# Patient Record
Sex: Female | Born: 1965
Health system: Southern US, Community
[De-identification: ages and names within clinical notes are randomized; demographics above are authoritative.]

## PROBLEM LIST (undated history)

## (undated) DIAGNOSIS — I82409 Acute embolism and thrombosis of unspecified deep veins of unspecified lower extremity: Secondary | ICD-10-CM

## (undated) DIAGNOSIS — Z8719 Personal history of other diseases of the digestive system: Secondary | ICD-10-CM

## (undated) DIAGNOSIS — K219 Gastro-esophageal reflux disease without esophagitis: Secondary | ICD-10-CM

## (undated) DIAGNOSIS — F419 Anxiety disorder, unspecified: Secondary | ICD-10-CM

## (undated) DIAGNOSIS — J309 Allergic rhinitis, unspecified: Secondary | ICD-10-CM

## (undated) DIAGNOSIS — F329 Major depressive disorder, single episode, unspecified: Secondary | ICD-10-CM

## (undated) DIAGNOSIS — G40909 Epilepsy, unspecified, not intractable, without status epilepticus: Secondary | ICD-10-CM

## (undated) DIAGNOSIS — C649 Malignant neoplasm of unspecified kidney, except renal pelvis: Secondary | ICD-10-CM

## (undated) DIAGNOSIS — I10 Essential (primary) hypertension: Secondary | ICD-10-CM

## (undated) DIAGNOSIS — E785 Hyperlipidemia, unspecified: Secondary | ICD-10-CM

## (undated) DIAGNOSIS — F32A Depression, unspecified: Secondary | ICD-10-CM

## (undated) DIAGNOSIS — I319 Disease of pericardium, unspecified: Secondary | ICD-10-CM

## (undated) HISTORY — DX: Major depressive disorder, single episode, unspecified: F32.9

## (undated) HISTORY — DX: Allergic rhinitis, unspecified: J30.9

## (undated) HISTORY — DX: Gastro-esophageal reflux disease without esophagitis: K21.9

## (undated) HISTORY — DX: Depression, unspecified: F32.A

## (undated) HISTORY — PX: NEPHRECTOMY: SHX65

## (undated) HISTORY — DX: Hyperlipidemia, unspecified: E78.5

## (undated) HISTORY — PX: TUBAL LIGATION: SHX77

## (undated) HISTORY — PX: UMBILICAL HERNIA REPAIR: SHX196

---

## 1997-06-30 ENCOUNTER — Encounter: Admission: RE | Admit: 1997-06-30 | Discharge: 1997-09-28 | Payer: Self-pay | Admitting: Obstetrics & Gynecology

## 1997-10-20 ENCOUNTER — Inpatient Hospital Stay (HOSPITAL_COMMUNITY): Admission: AD | Admit: 1997-10-20 | Discharge: 1997-10-20 | Payer: Self-pay | Admitting: Obstetrics & Gynecology

## 1997-11-21 ENCOUNTER — Encounter: Admission: RE | Admit: 1997-11-21 | Discharge: 1997-11-21 | Payer: Self-pay | Admitting: Internal Medicine

## 1998-03-08 ENCOUNTER — Encounter: Admission: RE | Admit: 1998-03-08 | Discharge: 1998-03-08 | Payer: Self-pay | Admitting: Internal Medicine

## 1998-07-04 ENCOUNTER — Encounter: Admission: RE | Admit: 1998-07-04 | Discharge: 1998-07-04 | Payer: Self-pay | Admitting: Internal Medicine

## 1998-07-26 ENCOUNTER — Ambulatory Visit (HOSPITAL_COMMUNITY): Admission: RE | Admit: 1998-07-26 | Discharge: 1998-07-26 | Payer: Self-pay | Admitting: Hematology and Oncology

## 1998-07-26 ENCOUNTER — Encounter: Payer: Self-pay | Admitting: Hematology and Oncology

## 1998-07-26 ENCOUNTER — Encounter: Admission: RE | Admit: 1998-07-26 | Discharge: 1998-07-26 | Payer: Self-pay | Admitting: Hematology and Oncology

## 1998-08-09 ENCOUNTER — Encounter: Admission: RE | Admit: 1998-08-09 | Discharge: 1998-08-09 | Payer: Self-pay | Admitting: Internal Medicine

## 2001-06-02 ENCOUNTER — Other Ambulatory Visit: Admission: RE | Admit: 2001-06-02 | Discharge: 2001-06-02 | Payer: Self-pay | Admitting: Family Medicine

## 2002-05-19 ENCOUNTER — Other Ambulatory Visit: Admission: RE | Admit: 2002-05-19 | Discharge: 2002-05-19 | Payer: Self-pay | Admitting: *Deleted

## 2002-05-24 ENCOUNTER — Encounter: Admission: RE | Admit: 2002-05-24 | Discharge: 2002-08-22 | Payer: Self-pay | Admitting: Family Medicine

## 2002-06-25 ENCOUNTER — Encounter (INDEPENDENT_AMBULATORY_CARE_PROVIDER_SITE_OTHER): Payer: Self-pay

## 2002-06-25 ENCOUNTER — Ambulatory Visit (HOSPITAL_COMMUNITY): Admission: RE | Admit: 2002-06-25 | Discharge: 2002-06-25 | Payer: Self-pay | Admitting: *Deleted

## 2004-05-16 ENCOUNTER — Emergency Department: Payer: Self-pay | Admitting: Emergency Medicine

## 2005-04-01 HISTORY — PX: ENDOMETRIAL ABLATION: SHX621

## 2005-08-12 ENCOUNTER — Ambulatory Visit: Payer: Self-pay | Admitting: General Practice

## 2005-11-12 ENCOUNTER — Ambulatory Visit: Payer: Self-pay | Admitting: Infectious Diseases

## 2005-11-25 ENCOUNTER — Ambulatory Visit: Payer: Self-pay | Admitting: Infectious Diseases

## 2005-12-03 ENCOUNTER — Other Ambulatory Visit: Payer: Self-pay

## 2005-12-05 ENCOUNTER — Ambulatory Visit: Payer: Self-pay | Admitting: Unknown Physician Specialty

## 2005-12-10 ENCOUNTER — Ambulatory Visit: Payer: Self-pay | Admitting: Infectious Diseases

## 2005-12-17 ENCOUNTER — Ambulatory Visit: Payer: Self-pay | Admitting: Pain Medicine

## 2005-12-18 ENCOUNTER — Ambulatory Visit: Payer: Self-pay | Admitting: Pain Medicine

## 2006-01-06 ENCOUNTER — Other Ambulatory Visit: Payer: Self-pay

## 2006-01-06 ENCOUNTER — Ambulatory Visit: Payer: Self-pay | Admitting: Infectious Diseases

## 2006-06-18 ENCOUNTER — Ambulatory Visit: Payer: Self-pay

## 2006-06-21 ENCOUNTER — Emergency Department (HOSPITAL_COMMUNITY): Admission: EM | Admit: 2006-06-21 | Discharge: 2006-06-21 | Payer: Self-pay | Admitting: Emergency Medicine

## 2007-01-17 ENCOUNTER — Emergency Department (HOSPITAL_COMMUNITY): Admission: EM | Admit: 2007-01-17 | Discharge: 2007-01-17 | Payer: Self-pay | Admitting: Emergency Medicine

## 2007-01-26 ENCOUNTER — Ambulatory Visit: Payer: Self-pay | Admitting: Internal Medicine

## 2007-01-26 ENCOUNTER — Other Ambulatory Visit: Payer: Self-pay

## 2007-04-02 DIAGNOSIS — C649 Malignant neoplasm of unspecified kidney, except renal pelvis: Secondary | ICD-10-CM

## 2007-04-02 HISTORY — DX: Malignant neoplasm of unspecified kidney, except renal pelvis: C64.9

## 2007-08-12 ENCOUNTER — Ambulatory Visit: Payer: Self-pay

## 2007-09-18 ENCOUNTER — Emergency Department (HOSPITAL_COMMUNITY): Admission: EM | Admit: 2007-09-18 | Discharge: 2007-09-18 | Payer: Self-pay | Admitting: Emergency Medicine

## 2007-11-06 ENCOUNTER — Inpatient Hospital Stay (HOSPITAL_COMMUNITY): Admission: RE | Admit: 2007-11-06 | Discharge: 2007-11-10 | Payer: Self-pay | Admitting: Urology

## 2007-11-06 ENCOUNTER — Encounter (INDEPENDENT_AMBULATORY_CARE_PROVIDER_SITE_OTHER): Payer: Self-pay | Admitting: Urology

## 2007-11-15 ENCOUNTER — Inpatient Hospital Stay (HOSPITAL_COMMUNITY): Admission: EM | Admit: 2007-11-15 | Discharge: 2007-11-17 | Payer: Self-pay | Admitting: Emergency Medicine

## 2007-12-01 DIAGNOSIS — I82409 Acute embolism and thrombosis of unspecified deep veins of unspecified lower extremity: Secondary | ICD-10-CM

## 2007-12-01 HISTORY — DX: Acute embolism and thrombosis of unspecified deep veins of unspecified lower extremity: I82.409

## 2007-12-07 ENCOUNTER — Inpatient Hospital Stay (HOSPITAL_COMMUNITY): Admission: EM | Admit: 2007-12-07 | Discharge: 2007-12-14 | Payer: Self-pay | Admitting: Emergency Medicine

## 2007-12-08 ENCOUNTER — Ambulatory Visit: Payer: Self-pay | Admitting: Vascular Surgery

## 2007-12-08 ENCOUNTER — Encounter (INDEPENDENT_AMBULATORY_CARE_PROVIDER_SITE_OTHER): Payer: Self-pay | Admitting: Internal Medicine

## 2007-12-09 ENCOUNTER — Encounter (INDEPENDENT_AMBULATORY_CARE_PROVIDER_SITE_OTHER): Payer: Self-pay | Admitting: Internal Medicine

## 2007-12-15 ENCOUNTER — Ambulatory Visit: Payer: Self-pay | Admitting: Internal Medicine

## 2007-12-28 ENCOUNTER — Ambulatory Visit: Payer: Self-pay | Admitting: Internal Medicine

## 2008-01-04 ENCOUNTER — Ambulatory Visit: Payer: Self-pay | Admitting: Internal Medicine

## 2008-01-11 ENCOUNTER — Ambulatory Visit: Payer: Self-pay | Admitting: Internal Medicine

## 2008-01-19 ENCOUNTER — Ambulatory Visit: Payer: Self-pay | Admitting: Internal Medicine

## 2008-03-03 ENCOUNTER — Ambulatory Visit: Payer: Self-pay | Admitting: Internal Medicine

## 2008-04-06 ENCOUNTER — Ambulatory Visit: Payer: Self-pay | Admitting: Specialist

## 2008-04-12 ENCOUNTER — Ambulatory Visit: Payer: Self-pay | Admitting: Internal Medicine

## 2008-04-13 ENCOUNTER — Ambulatory Visit: Payer: Self-pay | Admitting: Internal Medicine

## 2008-04-18 ENCOUNTER — Ambulatory Visit: Payer: Self-pay | Admitting: Internal Medicine

## 2008-04-26 ENCOUNTER — Ambulatory Visit: Payer: Self-pay | Admitting: Pain Medicine

## 2008-05-04 ENCOUNTER — Ambulatory Visit: Payer: Self-pay | Admitting: Pain Medicine

## 2008-05-27 ENCOUNTER — Ambulatory Visit: Payer: Self-pay | Admitting: Internal Medicine

## 2008-06-14 ENCOUNTER — Ambulatory Visit: Payer: Self-pay | Admitting: Pain Medicine

## 2008-06-20 ENCOUNTER — Ambulatory Visit: Payer: Self-pay | Admitting: Pain Medicine

## 2008-08-30 ENCOUNTER — Ambulatory Visit: Payer: Self-pay

## 2008-09-26 ENCOUNTER — Emergency Department (HOSPITAL_COMMUNITY): Admission: EM | Admit: 2008-09-26 | Discharge: 2008-09-27 | Payer: Self-pay | Admitting: Emergency Medicine

## 2009-01-31 ENCOUNTER — Ambulatory Visit: Payer: Self-pay | Admitting: Pain Medicine

## 2009-02-06 ENCOUNTER — Ambulatory Visit: Payer: Self-pay | Admitting: Pain Medicine

## 2009-02-14 ENCOUNTER — Emergency Department (HOSPITAL_COMMUNITY): Admission: EM | Admit: 2009-02-14 | Discharge: 2009-02-14 | Payer: Self-pay | Admitting: Emergency Medicine

## 2009-02-20 ENCOUNTER — Ambulatory Visit: Payer: Self-pay | Admitting: Family Medicine

## 2009-02-21 ENCOUNTER — Ambulatory Visit: Payer: Self-pay | Admitting: Pain Medicine

## 2009-03-29 ENCOUNTER — Ambulatory Visit: Payer: Self-pay | Admitting: Pain Medicine

## 2009-04-03 ENCOUNTER — Encounter: Payer: Self-pay | Admitting: Neurology

## 2009-04-14 ENCOUNTER — Observation Stay (HOSPITAL_COMMUNITY): Admission: EM | Admit: 2009-04-14 | Discharge: 2009-04-15 | Payer: Self-pay | Admitting: Emergency Medicine

## 2009-05-02 ENCOUNTER — Encounter: Payer: Self-pay | Admitting: Neurology

## 2009-08-08 ENCOUNTER — Ambulatory Visit: Payer: Self-pay | Admitting: Nephrology

## 2009-10-19 ENCOUNTER — Ambulatory Visit: Payer: Self-pay | Admitting: Family Medicine

## 2009-12-09 ENCOUNTER — Emergency Department (HOSPITAL_COMMUNITY): Admission: EM | Admit: 2009-12-09 | Discharge: 2009-12-10 | Payer: Self-pay | Admitting: Emergency Medicine

## 2010-05-07 ENCOUNTER — Emergency Department: Payer: Self-pay | Admitting: Emergency Medicine

## 2010-05-14 ENCOUNTER — Emergency Department: Payer: Self-pay | Admitting: Emergency Medicine

## 2010-06-14 LAB — POCT I-STAT, CHEM 8
BUN: 27 mg/dL — ABNORMAL HIGH (ref 6–23)
Calcium, Ion: 0.91 mmol/L — ABNORMAL LOW (ref 1.12–1.32)
Chloride: 106 mEq/L (ref 96–112)
Creatinine, Ser: 1.2 mg/dL (ref 0.4–1.2)
Glucose, Bld: 92 mg/dL (ref 70–99)
HCT: 40 % (ref 36.0–46.0)

## 2010-06-14 LAB — POCT CARDIAC MARKERS
CKMB, poc: <1 ng/mL — ABNORMAL LOW (ref 1.0–8.0)
Myoglobin, poc: 62.5 ng/mL (ref 12–200)
Troponin i, poc: <0.05 ng/mL (ref 0.00–0.09)

## 2010-06-14 LAB — URINALYSIS, ROUTINE W REFLEX MICROSCOPIC
Bilirubin Urine: NEGATIVE
Glucose, UA: NEGATIVE mg/dL
Ketones, ur: NEGATIVE mg/dL
Protein, ur: NEGATIVE mg/dL

## 2010-06-14 LAB — CBC
HCT: 37.5 % (ref 36.0–46.0)
MCH: 30.2 pg (ref 26.0–34.0)
MCV: 88.2 fL (ref 78.0–100.0)
Platelets: 288 10*3/uL (ref 150–400)
RDW: 15 % (ref 11.5–15.5)

## 2010-06-14 LAB — DIFFERENTIAL
Eosinophils Absolute: 0.1 10*3/uL (ref 0.0–0.7)
Eosinophils Relative: 2 % (ref 0–5)
Lymphs Abs: 2.3 10*3/uL (ref 0.7–4.0)
Monocytes Absolute: 0.7 10*3/uL (ref 0.1–1.0)

## 2010-06-14 LAB — D-DIMER, QUANTITATIVE: D-Dimer, Quant: 0.41 ug/mL-FEU (ref 0.00–0.48)

## 2010-06-14 LAB — POCT PREGNANCY, URINE: Preg Test, Ur: NEGATIVE

## 2010-06-17 LAB — CARDIAC PANEL(CRET KIN+CKTOT+MB+TROPI)
Relative Index: INVALID (ref 0.0–2.5)
Relative Index: INVALID (ref 0.0–2.5)
Total CK: 34 U/L (ref 7–177)
Total CK: 37 U/L (ref 7–177)
Troponin I: 0.02 ng/mL (ref 0.00–0.06)

## 2010-06-17 LAB — HEMOGLOBIN A1C
Hgb A1c MFr Bld: 6.2 % — ABNORMAL HIGH (ref 4.6–6.1)
Mean Plasma Glucose: 131 mg/dL

## 2010-06-17 LAB — CBC
HCT: 37 % (ref 36.0–46.0)
MCHC: 33.9 g/dL (ref 30.0–36.0)
MCHC: 34.2 g/dL (ref 30.0–36.0)
MCV: 90.8 fL (ref 78.0–100.0)
MCV: 90.9 fL (ref 78.0–100.0)
MCV: 91.3 fL (ref 78.0–100.0)
Platelets: 230 10*3/uL (ref 150–400)
Platelets: 290 10*3/uL (ref 150–400)
RBC: 3.41 MIL/uL — ABNORMAL LOW (ref 3.87–5.11)
RBC: 3.46 MIL/uL — ABNORMAL LOW (ref 3.87–5.11)
RDW: 14.5 % (ref 11.5–15.5)
WBC: 5 10*3/uL (ref 4.0–10.5)
WBC: 5.5 10*3/uL (ref 4.0–10.5)

## 2010-06-17 LAB — COMPREHENSIVE METABOLIC PANEL
BUN: 24 mg/dL — ABNORMAL HIGH (ref 6–23)
Calcium: 9.2 mg/dL (ref 8.4–10.5)
Glucose, Bld: 112 mg/dL — ABNORMAL HIGH (ref 70–99)
Total Protein: 7.4 g/dL (ref 6.0–8.3)

## 2010-06-17 LAB — DIFFERENTIAL
Basophils Absolute: 0.1 10*3/uL (ref 0.0–0.1)
Basophils Relative: 2 % — ABNORMAL HIGH (ref 0–1)
Lymphs Abs: 1.8 10*3/uL (ref 0.7–4.0)
Monocytes Relative: 12 % (ref 3–12)
Monocytes Relative: 8 % (ref 3–12)
Neutro Abs: 2.4 10*3/uL (ref 1.7–7.7)
Neutro Abs: 3.9 10*3/uL (ref 1.7–7.7)
Neutrophils Relative %: 43 % (ref 43–77)
Neutrophils Relative %: 62 % (ref 43–77)

## 2010-06-17 LAB — CK TOTAL AND CKMB (NOT AT ARMC)
CK, MB: 0.4 ng/mL (ref 0.3–4.0)
CK, MB: 0.5 ng/mL (ref 0.3–4.0)
Relative Index: INVALID (ref 0.0–2.5)
Total CK: 36 U/L (ref 7–177)
Total CK: 42 U/L (ref 7–177)

## 2010-06-17 LAB — AMYLASE: Amylase: 76 U/L (ref 0–105)

## 2010-06-17 LAB — LIPID PANEL
HDL: 35 mg/dL — ABNORMAL LOW (ref >39)
Total CHOL/HDL Ratio: 3.9 RATIO
VLDL: 29 mg/dL (ref 0–40)

## 2010-06-17 LAB — POCT I-STAT, CHEM 8
Chloride: 107 mEq/L (ref 96–112)
Creatinine, Ser: 1 mg/dL (ref 0.4–1.2)
Glucose, Bld: 114 mg/dL — ABNORMAL HIGH (ref 70–99)
HCT: 38 % (ref 36.0–46.0)
Potassium: 4.2 mEq/L (ref 3.5–5.1)

## 2010-06-17 LAB — BASIC METABOLIC PANEL
CO2: 25 mEq/L (ref 19–32)
Calcium: 8.3 mg/dL — ABNORMAL LOW (ref 8.4–10.5)
Chloride: 100 mEq/L (ref 96–112)
Chloride: 102 mEq/L (ref 96–112)
Creatinine, Ser: 0.98 mg/dL (ref 0.4–1.2)
GFR calc Af Amer: >60 mL/min (ref >60)
GFR calc Af Amer: >60 mL/min (ref >60)
GFR calc non Af Amer: >60 mL/min (ref >60)
Sodium: 133 mEq/L — ABNORMAL LOW (ref 135–145)

## 2010-06-17 LAB — MAGNESIUM: Magnesium: 2.5 mg/dL (ref 1.5–2.5)

## 2010-06-17 LAB — POCT CARDIAC MARKERS
CKMB, poc: <1 ng/mL — ABNORMAL LOW (ref 1.0–8.0)
CKMB, poc: <1 ng/mL — ABNORMAL LOW (ref 1.0–8.0)
Troponin i, poc: 0.12 ng/mL — ABNORMAL HIGH (ref 0.00–0.09)

## 2010-06-17 LAB — GLUCOSE, CAPILLARY
Glucose-Capillary: 119 mg/dL — ABNORMAL HIGH (ref 70–99)
Glucose-Capillary: 85 mg/dL (ref 70–99)

## 2010-06-17 LAB — D-DIMER, QUANTITATIVE: D-Dimer, Quant: 5.21 ug/mL-FEU — ABNORMAL HIGH (ref 0.00–0.48)

## 2010-06-17 LAB — LIPASE, BLOOD: Lipase: 30 U/L (ref 11–59)

## 2010-07-04 LAB — POCT I-STAT, CHEM 8
Creatinine, Ser: 1 mg/dL (ref 0.4–1.2)
Glucose, Bld: 114 mg/dL — ABNORMAL HIGH (ref 70–99)
HCT: 37 % (ref 36.0–46.0)
Hemoglobin: 12.6 g/dL (ref 12.0–15.0)
Potassium: 3.9 mEq/L (ref 3.5–5.1)
TCO2: 22 mmol/L (ref 0–100)

## 2010-07-04 LAB — DIFFERENTIAL
Basophils Absolute: 0.1 10*3/uL (ref 0.0–0.1)
Basophils Relative: 1 % (ref 0–1)
Eosinophils Absolute: 0.1 10*3/uL (ref 0.0–0.7)
Monocytes Absolute: 0.6 10*3/uL (ref 0.1–1.0)
Neutro Abs: 4.9 10*3/uL (ref 1.7–7.7)
Neutrophils Relative %: 58 % (ref 43–77)

## 2010-07-04 LAB — GLUCOSE, CAPILLARY: Glucose-Capillary: 92 mg/dL (ref 70–99)

## 2010-07-04 LAB — CBC
Hemoglobin: 11.7 g/dL — ABNORMAL LOW (ref 12.0–15.0)
MCHC: 32.8 g/dL (ref 30.0–36.0)
MCV: 91.5 fL (ref 78.0–100.0)
RDW: 14.1 % (ref 11.5–15.5)

## 2010-07-09 LAB — COMPREHENSIVE METABOLIC PANEL
ALT: 13 U/L (ref 0–35)
AST: 17 U/L (ref 0–37)
Albumin: 3.5 g/dL (ref 3.5–5.2)
Alkaline Phosphatase: 74 U/L (ref 39–117)
Calcium: 9.1 mg/dL (ref 8.4–10.5)
GFR calc Af Amer: >60 mL/min (ref >60)
Glucose, Bld: 121 mg/dL — ABNORMAL HIGH (ref 70–99)
Potassium: 3.5 mEq/L (ref 3.5–5.1)
Sodium: 136 mEq/L (ref 135–145)
Total Protein: 7 g/dL (ref 6.0–8.3)

## 2010-07-09 LAB — POCT CARDIAC MARKERS: Troponin i, poc: <0.05 ng/mL (ref 0.00–0.09)

## 2010-07-09 LAB — URINALYSIS, ROUTINE W REFLEX MICROSCOPIC
Glucose, UA: NEGATIVE mg/dL
Ketones, ur: NEGATIVE mg/dL
Nitrite: NEGATIVE
Protein, ur: NEGATIVE mg/dL
pH: 5.5 (ref 5.0–8.0)

## 2010-07-09 LAB — CBC
Hemoglobin: 11.7 g/dL — ABNORMAL LOW (ref 12.0–15.0)
MCHC: 33.2 g/dL (ref 30.0–36.0)
Platelets: 279 10*3/uL (ref 150–400)
RDW: 14.7 % (ref 11.5–15.5)

## 2010-07-09 LAB — DIFFERENTIAL
Basophils Relative: 0 % (ref 0–1)
Eosinophils Absolute: 0.1 10*3/uL (ref 0.0–0.7)
Lymphs Abs: 1.7 10*3/uL (ref 0.7–4.0)
Monocytes Absolute: 0.5 10*3/uL (ref 0.1–1.0)
Monocytes Relative: 9 % (ref 3–12)
Neutrophils Relative %: 60 % (ref 43–77)

## 2010-07-09 LAB — POCT PREGNANCY, URINE: Preg Test, Ur: NEGATIVE

## 2010-08-14 NOTE — Discharge Summary (Signed)
NAMEJENISSE, VULLO              ACCOUNT NO.:  [id number]   MEDICAL RECORD NO.:  [id number]          PATIENT TYPE:  INP   LOCATION:  1426                         FACILITY:  [facility]   PHYSICIAN:  Herbie Saxon, MDDATE OF BIRTH:  1965-09-27   DATE OF ADMISSION:  12/06/2007  DATE OF DISCHARGE:  12/14/2007                               DISCHARGE SUMMARY   DISCHARGE DIAGNOSES:  1. Left leg deep venous thrombosis, subacute, nonocclusive.  2. Atypical chest pain.  3. Morbid obesity, query obesity hypoventilation syndrome.  4. Hypertension, stable.  5. Diabetes mellitus type 2, stable.  6. History of renal cell carcinoma, status post nephrectomy.  7. History of arthritis.  8. History of gastroesophageal reflux disease.  9. History of nonsteroidal use.   RADIOLOGY:  1. CT, chest, of December 07, 2007 showed no evidence of thoracic      abnormality.  2. Chest x-ray of December 07, 2007 showed no acute cardiopulmonary      abnormalities.  3. V/Q scan of December 06, 2007.  Normal study.  Very low probability      for pulmonary embolism.   HOSPITAL COURSE:  This 45 year old African American lady presented to  the emergency room with chest pain, retrosternal, and shortness of  breath.  The symptoms have been ongoing for two weeks.  Patient had  undergone the left-sided nephrectomy for renal cell cancer on November 06, 2007.  This was done by Dr. Logan Bores.  On presentation, the patient's blood  pressure was mildly elevated.  She also had evidence of mild anemia.  Workup for pulmonary embolism was negative; however, the bilateral leg  venous Doppler of December 09, 2007 did show a nonocclusive, subacute  left DVT in the popliteal vein.  Patient was started on anticoagulants  with Lovenox bridging with Coumadin.  D-dimer was slightly elevated at  0.56.  The ABG, chest x-ray, and CT chest were negative for PE.   Patient also had a pulmonary function test performed on this admission,  which showed mild, minimally obstructive, restrictive defect with mild  response to bronchodilator.  With the underlying possibility of  obstructive sleep apnea, patient is being scheduled to have sleep study  as outpatient.  This will be coordinated by the primary care physician.  There have been no bleeding episodes.  Chest pain has improved on proton  pump inhibitor.  Patient has been counseled on avoiding nonsteroidal  anti-inflammatory drugs and only to use aspirin p.r.n. if she has no  recurrence of chest pain.  Also encouraged to hold off anticoagulation  and antiplatelets if she has any bleeding episodes.   DISCHARGE CONDITION:  Stable.   DIET:  Low sodium, heart healthy, low cholesterol, 1800-calorie ADA.   ACTIVITY:  To be increased slowly as tolerated.   FOLLOW UP:  1. With her primary care physician, Dr. Darrick Huntsman, on December 15, 2007.      Primary care physician will optimize the Coumadin treatment, as      patient has been sent home on Coumadin 10 mg daily.  A goal INR      will be 2-3.  The PT/INR is to be checked twice weekly with      hemoglobin checks as well.  2. Follow up with Dr. Logan Bores of urology in the next 4-6 weeks as      needed.   DISCHARGE MEDICATIONS:  1. Glipizide 2.5 mg daily.  2. Metformin 1000 mg b.i.d.  3. Nexium 40 mg daily.  4. Coreg 25 mg b.i.d.  5. Lisinopril 10 mg daily.  6. Percocet 5/325 1-2 tablets q.4-6h. p.r.n.  7. Nitroglycerin 0.4 mg sublingual p.r.n.  8. Enteric-coated aspirin 81 mg daily p.r.n. chest pain.  9. Coumadin 10 mg nightly.  The Coumadin will be optimized as an      outpatient. Check PT/INR.  10.Xanax 0.5 mg twice daily.  11.Combivent MDI 2 puffs q.6h. p.r.n.   Polysomnography as an outpatient.   PHYSICAL EXAMINATION:  This is a middle-aged lady in no acute distress,  obese.  Temperature 98, pulse 67, respiratory rate 18, blood pressure 108/62.  Head is normocephalic and atraumatic.  Pupils are equal, round and   reactive to light and accommodation.  Eyes are pale, not jaundiced.  NECK:  Supple.  Oropharynx and nasopharynx are clear.  There is no JVD elevation.  No thyromegaly.  No adenopathy.  No carotid  bruit.  Apex beat 5th intercostal space, mid clavicular line.  Heart sounds, 1  and 2, regular.  No rubs, murmurs, or gallops.  CHEST:  Clear.  No rhonchi.  No rales or rubs.  ABDOMEN:  Benign with truncal obesity.  No organomegaly.  Bowel sounds  normoactive.  She is alert and oriented to time, place, and person.  Cranial nerves,  sensory, and motor system grossly normal.  Peripheral pulses are without  pedal edema.   LABS:  WBC is 4, hematocrit 33, platelet count 281.  Blood gas reveals  pH 7.42, pCO2 35, pO2 109, bicarbonate 22.  Chemistry shows a sodium  140, potassium 4.1, chloride 107, bicarbonate 24, glucose 139, BUN 13,  creatinine 1.1.  PT 26, INR 2.3.      Herbie Saxon, MD  Electronically Signed     MIO/MEDQ  D:  12/14/2007  T:  12/14/2007  Job:  [id number]

## 2010-08-14 NOTE — H&P (Signed)
Denise Davis, Denise Davis              ACCOUNT NO.:  [id number]   MEDICAL RECORD NO.:  [id number]         PATIENT TYPE:  LINP   LOCATION:                               FACILITY:  [facility]   PHYSICIAN:  Della Goo, M.D. DATE OF BIRTH:  Oct 05, 1965   DATE OF ADMISSION:  12/07/2007  DATE OF DISCHARGE:                              HISTORY & PHYSICAL   PRIMARY CARE PHYSICIAN:  Unassigned.   CHIEF COMPLAINT:  Chest pain and shortness of breath.   HISTORY OF PRESENT ILLNESS:  This is a 45 year old female presenting to  the emergency department with complaints of severe shortness of breath  for the past 3 days then onset of constant pleuritic chest pain that she  describes over the past 24 hours.  The patient reports having dyspnea on  exertion, denies having any nausea, vomiting or diaphoresis.  She  describes the pain as being located in the mid chest area.  Rates the  pain as being a 7/10 at the worst.  Per report given to the emergency  department physician, the patient reports having increasing shortness of  breath for 2 weeks.  She also described having pain that was radiating  from the mid chest into the left jaw.  Of note, patient underwent a left-  sided nephrectomy on November 06, 2007, for renal cell carcinoma of the  left kidney.  The surgeon was urologist, Dr. Logan Bores.   PAST MEDICAL HISTORY:  Significant for:  1. Type 2 diabetes mellitus.  2. Hypertension.  3. Morbid obesity.  4. The nephrectomy, as mentioned above, for renal cell carcinoma of      the left kidney.  5. History of arthritis.  6. History of gastroesophageal reflux disease.   MEDICATIONS:  1. Metformin 1000 mg one p.o. b.i.d.  2. Nexium 40 mg one p.o. daily.  3. Carvedilol 25 mg one p.o. b.i.d.  4. Lisinopril 10 mg one p.o. daily.  5. Tramadol 100 mg nightly.  6. Percocet 1 tablet p.o. q.4-6 h p.r.n.   ALLERGIES:  No known drug allergies.   SOCIAL HISTORY:  Patient is married.  She is a nonsmoker, nondrinker  and  no history of illicit drug usage.   FAMILY HISTORY:  Positive for diabetes, hypertension and patient had an  aunt with cervical cancer.   REVIEW OF SYSTEMS:  Pertinents are mentioned above.   PHYSICAL EXAMINATION FINDINGS:  This is a 45 year old morbidly obese  female in discomfort but no acute distress.  VITAL SIGNS:  Temperature 99.0, blood pressure 144/95, heart rate 96,  respirations 24, O2 saturation 100%.  HEENT EXAMINATION:  Normocephalic, atraumatic.  Pupils equally round,  reactive to light.  Extraocular movements are intact.  There is no  scleral icterus.  Funduscopic benign.  Oropharynx is clear.  NECK:  Supple.  Full range of motion.  No thyromegaly, adenopathy,  jugular venous distention.  CARDIOVASCULAR:  Regular rate and rhythm.  No murmurs, gallops, rubs.  LUNGS:  Clear to auscultation bilaterally.  ABDOMEN:  Positive bowel sounds, soft, nontender, nondistended.  EXTREMITIES:  Without cyanosis, clubbing or edema.  NEUROLOGIC EXAMINATION:  Nonfocal.  LABORATORY STUDIES:  White blood cell count 5.5, hemoglobin 11.6,  hematocrit 35.2, MCV 87.3, platelets 373, neutrophils 44%, lymphocytes  39%.  Chest x-ray reveals no acute disease findings.  Sodium 138,  potassium 4.1, chloride 107, bicarb 22, BUN 16, creatinine 1.0, glucose  117, myoglobin 52.1, CK-MB less than 1.0, troponin less than 0.05. A VQ  scan was performed secondary to high clinical suspicion, results of  which returned with low probability.  Patient had an EKG performed which  did reveal a normal sinus rhythm without acute ST-segment changes.   ASSESSMENT:  A 45 year old female being admitted with:  1. Pleuritic chest pain.  2. Shortness of breath.  3. Mild anemia.  4. Renal insufficiency secondary to left nephrectomy.  5. Morbid obesity.  6. Diabetes mellitus.   PLAN:  Patient will be admitted to a telemetry area and cardiac enzymes  will be performed.  Full-dose Lovenox therapy has been  ordered.  Patient  has been placed on nitrites and oxygen therapy as well.  She was already  administered aspirin therapy and this will continue as well.  GI  prophylaxis has been ordered and patient's regular medications will be  ordered except her metformin and lisinopril will be held.  Further  workup will ensue pending results of patient's clinical course and  results of her studies.  This patient has been has been placed on full-  dose Lovenox treatment secondary to high clinical suspicion despite the  low probability VQ scan.      Della Goo, M.D.  Electronically Signed     HJ/MEDQ  D:  12/07/2007  T:  12/07/2007  Job:  [id number]

## 2010-08-14 NOTE — Op Note (Signed)
Denise Davis, Denise Davis              ACCOUNT NO.:  [id number]   MEDICAL RECORD NO.:  [id number]          PATIENT TYPE:  INP   LOCATION:  1610                         FACILITY:  [facility]   PHYSICIAN:  Jamison Neighbor, M.D.  DATE OF BIRTH:  08-28-1965   DATE OF PROCEDURE:  11/06/2007  DATE OF DISCHARGE:                               OPERATIVE REPORT   PREOPERATIVE DIAGNOSIS:  Left renal cell carcinoma.   POSTOPERATIVE DIAGNOSIS:  Left renal cell carcinoma.   PROCEDURE:  Laparoscopy, with conversion to open left radical  nephrectomy.   SURGEON:  Jamison Neighbor, M.D.   ASSISTANT:  Bertram Millard. Dahlstedt, M.D.   ANESTHESIA:  General.   COMPLICATIONS:  None.  Inability to perform left laparoscopic  nephrectomy due to the patient's large body habitus.   DRAINS:  Foley catheter and NG tube.   HISTORY:  This 45 year old female was found in the emergency room to  have a mass in her left kidney.  This was confirmed with a CT scan which  showed irregular mass coming out of the upper pole.  There was also a  small simple cyst in the lower pole.  The patient was given several  options for therapy.  Due to the patient's large body habitus (365  pounds) it was certainly felt that a laparoscopic partial nephrectomy  was contraindicated.  The patient was given the option of either open  partial nephrectomy or an open nephrectomy or a laparoscopic radical  nephrectomy.  The technical constraints involving the partial  nephrectomy were numerous.  This would include the fact that it is an  upper pole lesion and it does appear to be quite close to the collecting  system and at just under 5 cm was felt to be a little large for a  partial nephrectomy.  For that reason the decision was made to proceed  with a radical nephrectomy.  The patient was told she could certainly  have an attempt at a laparoscopic procedure but due to her large size it  was thought that there may be some real problems performing  that.  She  agreed that if the laparoscopic procedure could not be completed that  she could have an open nephrectomy.  She gave full informed consent.   PROCEDURE IN DETAIL:  After successful induction of general anesthesia  the patient was placed in a modified flank position at approximately 45  degrees.  She was secured with several rolls of tape, the beanbag as  well as two different leg restraints to make sure that she was well  positioned.  The tip of the twelfth rib was marked out as was the rib  cage on both the right and left side.  In this position when she was  tilted some of the abdominal pannus did rotate out to the side but it  was clear that the incision for the port would have to be somewhat  paramedian in location.  An incision was made in the skin and carried  down to the fascia in preparation for placement of the [facility] cannula.  This was very  difficult due to her large size.  Appendiceal retractors  really could barely reach down to the fascia but eventually the fascia  was opened.  A 0 Vicryl stitch was placed and the posterior sheath was  opened.  An extra large trocar was inserted and the abdomen was  inspected.  The patient had not had previous surgery but for reasons  that are unclear there were several adhesions and these were taken down  with the Harmonic scalpel.  The inflow was placed as high as possible  but due to the extensive weight of her abdominal wall it was very  difficult to obtain an adequate pneumoperitoneum.  There was no leak but  the thickness of the abdominal wall made the space relatively small.  Two other ports were placed in the left paramedian location towards the  xiphoid.  A 5 mm port was placed and down in the left lower quadrant a  12 port was placed.  This was all done under direct vision.  With the  three ports in place the adhesions were taken down heading over towards  the kidney but it was clear that there was really inadequate  space to  perform a left nephrectomy.  Consideration was given to placing the  HandPort but due to the small space between the abdominal wall and the  viscera it was felt that the best option would be to perform a standard  nephrectomy.  For that reason the patient had a subcostal incision that  was made starting at the midline and extending back towards the tip of  the twelfth rib.  The incision was carried down through the subcutaneous  tissues until the sheath was identified.  The sheath was then split  beginning at the midline extending through the rectus and then back to  the oblique musculature.  The retroperitoneal space was opened as was  the peritoneal space.  The Bookwalter retractor was inserted.  The  adhesions from the large bowel to the abdominal wall were taken down as  were some of the splenorenal ligaments.  The tip of the NG tube was  identified in the stomach.  The pancreas was identified as was the  spleen.  Once the bowel had been mobilized the transverse colon and left  colon as well as the spleen, pancreas and small bowel were all packed  off.  The Gerota's fascia could be identified.  The kidney within  Gerota's fascia was mobilized posteriorly.  The ureter was identified  and was doubly clipped and divided.  The hilum was mobilized.  A silk  tie was placed around the vein and used as a retractor.  The posterior  portion of the kidney was mobilized as was the upper pole where the  tumor could be identified.  The adrenal gland did not appear to be  involved and given the patient's young age it was felt that this should  be left alone even though this was an upper pole tumor.  The adrenal  gland was dissected off of the kidney with electrocautery.  The entire  kidney was mobilized until all that was left was the hilum.  The renal  artery was identified.  It was tied off.  The vein was then tied and  transected.  The patient was left with a tie and two clips on the   patient side and one tie on the kidney side.  The artery was then  transected with two ties and two clips on the  patient side and a tie on  the abdominal side.  The specimen was sent off and appeared to have been  removed intact.  The operative site was irrigated.  Hemostasis appeared  to be adequate.  Some Surgicel was placed over the adrenal gland even  though there really was no active bleeding.  The area was inspected and  appeared to be fine.  The duodenum had not been injured.  The pancreas  and spleen were uninjured.  With some irrigant in the wound an  insufflation was done by the nurse anesthetist.  There was no sign of  any injury to the pleura.  The incision was then closed with two layers  of #1 PDS.  The skin was closed with surgical clips.  The 12 port was  closed from the inside in order to prevent hernia formation.  The  original Hasson port could not be closed as it was too far from the  incision.  The incision was opened a little bit so that the fascia could  be identified and was closed with a Vicryl stitch.  The port sites were  then closed with staples as was the main incision.  The patient  tolerated the procedure and was taken to recovery in good condition.      Jamison Neighbor, M.D.  Electronically Signed     RJE/MEDQ  D:  11/06/2007  T:  11/06/2007  Job:  [id number]

## 2010-08-14 NOTE — H&P (Signed)
Denise Davis, Denise Davis              ACCOUNT NO.:  [id number]   MEDICAL RECORD NO.:  [id number]          PATIENT TYPE:  INP   LOCATION:  1610                         FACILITY:  [facility]   PHYSICIAN:  Jamison Neighbor, M.D.  DATE OF BIRTH:  1966/01/20   DATE OF ADMISSION:  11/06/2007  DATE OF DISCHARGE:                              HISTORY & PHYSICAL   SERVICE:  Urology.   ADMITTING DIAGNOSES:  1. Renal cell carcinoma.  2. Arthritis.  3. Diabetes mellitus.  4. Esophageal reflux.  5. Hypertension.   HISTORY OF PRESENT ILLNESS:  This 45 year old female was seen in the  emergency room where she was evaluated with an abdominal ultrasound and  found to have a mass in the upper pole of the left kidney.  A CT scan  was obtained, and this confirmed the presence of a large mass in the  left kidney.  The patient's evaluation did not show anything specific  for her GI discomfort, and it was simply felt this was probably due to  reflux disease.  Her initial abdominal ultrasound was done for the  purpose of evaluating for gallbladder disease, but that was felt to be  unremarkable.   The patient was given several options for therapy.  We certainly offered  to [facility] her to an academic medical center.  She  decided, however, that  she preferred to have this done closer to home.  She was offered the  possibility of an open partial nephrectomy, though it was felt there  were technical issues which made that very difficult.  We also talked  about laparoscopic versus open nephrectomy.  The patient decided to try  the laparoscopic nephrectomy, but understood that due to her large body  habitus it is very possible that she would not be able to have that  procedure done.  She is to be admitted following her nephrectomy.   The patient's past medical history remarkable for:   1. Gastroesophageal reflux disease.  2. Arthritis.  3. Diabetes.  4. Hypertension.   The patient's only previous surgery was a  tubal ligation done in 1995  and a NovaSure procedure in 2007.   MEDICATIONS AT THE TIME OF ADMISSION:  1. Byetta 10 mcg.  2. Carvedilol 25 mg.  3. Ibuprofen which has been discontinued.  4. Lisinopril 10 mg.  5. Metformin 1000 mg.  6. Nexium 40 mg.  7. Tramadol taken at bedtime.   The patient is not known to have any allergies.   SOCIAL HISTORY:  Unremarkable.  She does not use tobacco or alcohol.  Drinks minimal amounts of caffeine.  She is married.   There is family history of hematuria, but, otherwise, the family history  is unremarkable.   Complete review of systems has been placed in the office records which  have been transferred over to the hospital chart.   On examination she is a well-developed large female in no acute  distress.  HEENT:  Normocephalic, atraumatic.  Cranial nerves II-XII are grossly  intact.  NECK:  Supple.  No adenopathy or thyromegaly.  Respirations unlabored.  ABDOMEN:  Soft,  nontender with no pallor, mass, rebound or guarding.  The patient has not had any tenderness in that left upper quadrant of  the left flank.  EXTREMITIES:  Had no cyanosis, clubbing, edema.  Peripheral pulses were  normal.  NEUROLOGIC:  System is intact.   IMPRESSION:  Left renal cell carcinoma.   PLAN:  Admit for nephrectomy.      Jamison Neighbor, M.D.  Electronically Signed     RJE/MEDQ  D:  11/06/2007  T:  11/06/2007  Job:  [id number]

## 2010-08-17 NOTE — Discharge Summary (Signed)
Denise Davis, Denise Davis              ACCOUNT NO.:  [id number]   MEDICAL RECORD NO.:  [id number]          PATIENT TYPE:  INP   LOCATION:  1434                         [facility]ACILITY:  [facility]   PHYSICIAN:  Jamison Neighbor, M.D.  DATE OF BIRTH:  09/15/1965   DATE OF ADMISSION:  11/06/2007  DATE OF DISCHARGE:  11/10/2007                               DISCHARGE SUMMARY   DISCHARGE DIAGNOSIS:  Renal cell carcinoma of the kidney.   SECONDARY DIAGNOSIS:  1. Diabetes.  2. Esophageal reflux.  3. Hypertension.  4. Morbid obesity.   PROCEDURE:  Laparoscopy and attempted nephrectomy followed by open  nephrectomy performed on November 06, 2007.   HISTORY:  This 45 year old female presented with abdominal pain and was  found to have a mass upper pole of the left kidney.  CT confirmed the  presence of a large mass on the left-hand side felt to be a little too  large for a partial nephrectomy.  The patient was admitted to the  hospital in preparation for a laparoscopic nephrectomy.  Due to her  large body habitus, a decision was made to proceed with an open  nephrectomy, which was successfully preformed on the day of admission.   PAST MEDICAL HISTORY:  Remarkable for:  1. Esophageal reflux disease.  2. Arthritis.  3. Diabetes.  4. Hypertension.   MEDICATIONS AT TIME OF ADMISSION:  Are well delineated in the initial  history and physical.   HOSPITAL COURSE:  The patient had successful nephrectomy and  postoperative day #1 had normal labs including a hematocrit of 32.4,  white count of 5.6 and unremarkable BUN and creatinine of 8 and 1.3  respectively.  She was slowly advanced on a regular diet.  She did not  note any nausea, had no heartburn.  She tolerated liquids well and  was  given a regular diet.  The patient is ready for discharge by November 10, 2007.  She is sent home with pain medication to return later on, 1 week  postoperatively, for removal of her staples.      Jamison Neighbor, M.D.  Electronically Signed     RJE/MEDQ  D:  12/19/2007  T:  12/21/2007  Job:  [id number]

## 2010-08-17 NOTE — Op Note (Signed)
   NAME:  Denise Davis, Denise Davis                        ACCOUNT NO.:  [id number]   MEDICAL RECORD NO.:  [id number]                   PATIENT TYPE:  AMB   LOCATION:  SDC                                  FACILITY:  [facility]   PHYSICIAN:  [place] B. Earlene Plater, M.D.               DATE OF BIRTH:  [birth year]   DATE OF PROCEDURE:  06/25/2002  DATE OF DISCHARGE:                                 OPERATIVE REPORT   PREOPERATIVE DIAGNOSIS:  1. Abnormal bleeding, ultrasound suggestive of polyp.  2. Morbid obesity.   POSTOPERATIVE DIAGNOSES:  1. Abnormal bleeding, ultrasound suggestive of polyp.  2. Morbid obesity.   PROCEDURES:  1. Hysteroscopy.  2. Dilatation and curettage.  3. Polyp removal.   SURGEON:  [place] B. Earlene Plater, M.D.   ANESTHESIA:  Epidural and paracervical block with 10 mL of  1% Nesacaine.   SPECIMENS:  Endometrial curettings and endometrial polyp.   FLUID DISTENTION MEDIUM:  Sorbitol.   FLUID DEFICIT:  70 cc.   ESTIMATED BLOOD LOSS:  Less than 50 mL.   COMPLICATIONS:  None.   INDICATIONS FOR PROCEDURE:  The patient with a history of menometrorrhagia.  Saline ultrasound was suggestive of endometrial polyp.   PROCEDURE:  The patient was taken to the operating room and epidural  anesthesia obtained.  She was placed in the ski position and prepped and  draped in standard fashion.  The bladder was emptied with a red rubber  catheter.  A speculum inserted and a paracervical block placed of 10 mL of  1% Nesacaine.   The anterior lip of the cervix was grasped with a toothed tenaculum.  The  uterus was sounded to 7 cm, slightly anteverted.  The cervix was easily  dilated to a #21 and diagnostic hysteroscope inserted after being flushed  with Sorbitol.  The uterine cavity was inspected.  Thin endometrial tissue  was noted,  both tubal ostia were easily visualized, and a large polyp was  noted in the lower portion of the uterine cavity.  This was removed with the  Randall-Stone forceps and  the uterus gently curetted with a gritty texture  noted.  The hysteroscope was reinserted and no other abnormalities noted.  Therefore, the procedure was terminated.   The patient tolerated the procedure well.  There were no complications.  She  was taken to the recovery room awake, alert, and in stable condition.                                               Gerri Spore B. Earlene Plater, M.D.    WBD/MEDQ  D:  06/25/2002  T:  06/26/2002  Job:  [id number]

## 2010-08-17 NOTE — H&P (Signed)
NAME:  Denise Davis, BRUMMOND                        ACCOUNT NO.:  [id number]   MEDICAL RECORD NO.:  [id number]                   PATIENT TYPE:  AMB   LOCATION:  SDC                                  [facility]ACILITY:  [facility]   PHYSICIAN:  [place] B. Earlene Plater, M.D.               DATE OF BIRTH:  01-06-1966   DATE OF ADMISSION:  06/25/2002  DATE OF DISCHARGE:                                HISTORY & PHYSICAL   PREOPERATIVE DIAGNOSIS:  Abnormal bleeding and sonohysterogram in the office  suggestive of endometrial polyp.   INTENDED PROCEDURE:  Hysteroscopy D&C, removal of polyp.   HISTORY OF PRESENT ILLNESS:  A 45 year old African-American female gravida 5  para 2 A 3 initially seen at the request of Dr. Kevan Ny for evaluation of  abnormal bleeding.  This has been ongoing for the last eight months, has  been heavy and up to two weeks per month.  Has associated dysmenorrhea.  Has  a history of recently diagnosed diabetes and long history of morbid obesity.  Taking nonsteroidals without relief of symptoms.  Has had good control of  diabetes on Glucovance.  Pelvic ultrasound in the office and subsequent  saline ultrasound suggestive of endometrial polyp.   PAST MEDICAL HISTORY:  1. Diabetes.  2. Questionable history of hypertension.   PAST OBSTETRICAL HISTORY:  1. Vaginal delivery x2.  2. Spontaneous abortion x3.   SURGICAL HISTORY:  Tubal ligation.   MEDICATIONS:  Glucovance.   ALLERGIES:  None.   SOCIAL HISTORY:  No alcohol, tobacco, or other drugs.   FAMILY HISTORY:  Diabetes, hypertension, and aunt with cervical cancer.   REVIEW OF SYSTEMS:  Otherwise noncontributory.   PHYSICAL EXAMINATION:  VITAL SIGNS:  Blood pressure 135/95, pulse 84, weight  381.  GENERAL:  Alert and oriented, no acute distress.  The patient is morbidly  obese.  NECK:  Supple, no thyromegaly.  HEART:  Regular rate and rhythm.  LUNGS:  Clear to auscultation.  ABDOMEN:  Obese.  Liver and spleen normal, no hernia.  SKIN:  Noted to have changes consistent with diabetes as acanthosis  nigricans in the axilla, groin, and around the umbilicus.  PELVIC:  Normal external genitalia, vagina and cervix normal.  Uterus is not  palpated due to body habitus.  No adnexal masses palpable.  Pap smear  February 2004 within normal limits.   ASSESSMENT:  Abnormal bleeding.  Pelvic ultrasound and saline ultrasound  suggestive of endometrial polyp.   PLAN:  Hysteroscopy D&C, removal of polyp.  Operative risks discussed  including infection, bleeding, uterine perforation, damage to surrounding  organs, and fluid overload.  All questions are answered; the patient wished  to proceed.  Gerri Spore B. Earlene Plater, M.D.    WBD/MEDQ  D:  06/22/2002  T:  06/22/2002  Job:  [id number]

## 2010-10-29 ENCOUNTER — Emergency Department: Payer: Self-pay | Admitting: Unknown Physician Specialty

## 2010-12-27 LAB — COMPREHENSIVE METABOLIC PANEL
CO2: 28
Calcium: 9.3
Creatinine, Ser: 0.67
GFR calc Af Amer: >60
GFR calc non Af Amer: >60
Glucose, Bld: 108 — ABNORMAL HIGH
Sodium: 139
Total Protein: 6.7

## 2010-12-27 LAB — LIPASE, BLOOD: Lipase: 22

## 2010-12-27 LAB — POCT I-STAT, CHEM 8
BUN: 12
Chloride: 103
Creatinine, Ser: 0.7
Sodium: 136

## 2010-12-28 LAB — CBC
HCT: 32.4 — ABNORMAL LOW
HCT: 38.6
Hemoglobin: 10.9 — ABNORMAL LOW
Hemoglobin: 12.9
MCHC: 33.3
MCHC: 33.5
MCV: 89.1
MCV: 89.9
Platelets: 265
Platelets: 312
RBC: 4.29
RDW: 15.1
RDW: 15.6 — ABNORMAL HIGH
WBC: 5.5

## 2010-12-28 LAB — BASIC METABOLIC PANEL
BUN: 8
CO2: 25
Calcium: 9.3
Chloride: 102
Creatinine, Ser: 0.71
GFR calc Af Amer: >60
GFR calc non Af Amer: >60
Glucose, Bld: 119 — ABNORMAL HIGH
Potassium: 3.6
Sodium: 138
Sodium: 140

## 2010-12-28 LAB — URINALYSIS, ROUTINE W REFLEX MICROSCOPIC
Bilirubin Urine: NEGATIVE
Glucose, UA: NEGATIVE
Ketones, ur: NEGATIVE
Nitrite: NEGATIVE
Protein, ur: NEGATIVE
Specific Gravity, Urine: 1.027
Urobilinogen, UA: 0.2
pH: 5

## 2010-12-28 LAB — URINE MICROSCOPIC-ADD ON

## 2010-12-28 LAB — PROTIME-INR
INR: 1
Prothrombin Time: 13.2

## 2010-12-28 LAB — TYPE AND SCREEN
ABO/RH(D): O POS
Antibody Screen: NEGATIVE

## 2010-12-28 LAB — GLUCOSE, CAPILLARY
Glucose-Capillary: 111 — ABNORMAL HIGH
Glucose-Capillary: 115 — ABNORMAL HIGH
Glucose-Capillary: 128 — ABNORMAL HIGH
Glucose-Capillary: 197 — ABNORMAL HIGH

## 2010-12-28 LAB — DIFFERENTIAL
Basophils Absolute: 0
Eosinophils Absolute: 0.1
Eosinophils Relative: 2
Lymphocytes Relative: 29
Monocytes Absolute: 0.6

## 2010-12-28 LAB — APTT: aPTT: 30

## 2011-01-02 LAB — CBC
HCT: 33.3 — ABNORMAL LOW
HCT: 35.2 — ABNORMAL LOW
Hemoglobin: 11.1 — ABNORMAL LOW
Hemoglobin: 11.6 — ABNORMAL LOW
MCHC: 33
MCHC: 33.2
MCV: 87.3
Platelets: 284
Platelets: 373
RBC: 3.79 — ABNORMAL LOW
RBC: 4.03
RDW: 14.7
RDW: 15.1
RDW: 15.4
WBC: 5.5

## 2011-01-02 LAB — LUPUS ANTICOAGULANT PANEL
Lupus Anticoagulant: DETECTED — AB
PTTLA Confirmation: 5 (ref <8.0)

## 2011-01-02 LAB — COMPREHENSIVE METABOLIC PANEL
Albumin: 3.1 — ABNORMAL LOW
Alkaline Phosphatase: 83
BUN: 13
GFR calc Af Amer: >60
Potassium: 4.1
Sodium: 140
Total Protein: 6.3

## 2011-01-02 LAB — POCT I-STAT, CHEM 8
Calcium, Ion: 1.17
Chloride: 107
Creatinine, Ser: 1
Glucose, Bld: 117 — ABNORMAL HIGH
Potassium: 4.1

## 2011-01-02 LAB — CARDIAC PANEL(CRET KIN+CKTOT+MB+TROPI)
CK, MB: 0.3
CK, MB: 0.4
Relative Index: INVALID
Total CK: 29
Total CK: 31
Troponin I: 0.02

## 2011-01-02 LAB — CARDIOLIPIN ANTIBODIES, IGG, IGM, IGA
Anticardiolipin IgA: 7 — ABNORMAL LOW (ref <13)
Anticardiolipin IgG: <7 — ABNORMAL LOW (ref <11)
Anticardiolipin IgM: <7 — ABNORMAL LOW (ref <10)

## 2011-01-02 LAB — PROTIME-INR
INR: 1
INR: 1.6 — ABNORMAL HIGH
INR: 2.3 — ABNORMAL HIGH
Prothrombin Time: 13.7
Prothrombin Time: 14.2
Prothrombin Time: 17 — ABNORMAL HIGH
Prothrombin Time: 26.5 — ABNORMAL HIGH

## 2011-01-02 LAB — GLUCOSE, CAPILLARY
Glucose-Capillary: 102 — ABNORMAL HIGH
Glucose-Capillary: 105 — ABNORMAL HIGH
Glucose-Capillary: 107 — ABNORMAL HIGH
Glucose-Capillary: 108 — ABNORMAL HIGH
Glucose-Capillary: 109 — ABNORMAL HIGH
Glucose-Capillary: 111 — ABNORMAL HIGH
Glucose-Capillary: 112 — ABNORMAL HIGH
Glucose-Capillary: 123 — ABNORMAL HIGH
Glucose-Capillary: 136 — ABNORMAL HIGH
Glucose-Capillary: 137 — ABNORMAL HIGH
Glucose-Capillary: 151 — ABNORMAL HIGH
Glucose-Capillary: 159 — ABNORMAL HIGH
Glucose-Capillary: 159 — ABNORMAL HIGH
Glucose-Capillary: 96

## 2011-01-02 LAB — RETICULOCYTES: Retic Count, Absolute: 43.3

## 2011-01-02 LAB — FOLATE: Folate: 10.1

## 2011-01-02 LAB — DIFFERENTIAL
Basophils Absolute: 0.2 — ABNORMAL HIGH
Basophils Relative: 3 — ABNORMAL HIGH
Eosinophils Absolute: 0.3
Eosinophils Relative: 5
Lymphocytes Relative: 39
Lymphs Abs: 2.2
Monocytes Absolute: 0.5
Monocytes Relative: 9
Neutro Abs: 2.4
Neutrophils Relative %: 44

## 2011-01-02 LAB — BLOOD GAS, ARTERIAL
Acid-base deficit: 0.6
Bicarbonate: 22.9
TCO2: 21.1
pCO2 arterial: 35.6
pH, Arterial: 7.426 — ABNORMAL HIGH

## 2011-01-02 LAB — FERRITIN: Ferritin: 66 (ref 10–291)

## 2011-01-02 LAB — IRON AND TIBC
Iron: 45
TIBC: 315
UIBC: 270

## 2011-01-02 LAB — POCT CARDIAC MARKERS: Myoglobin, poc: 52.1

## 2011-01-02 LAB — B-NATRIURETIC PEPTIDE (CONVERTED LAB): Pro B Natriuretic peptide (BNP): <30

## 2011-01-02 LAB — HEMATOCRIT: HCT: 32.2 — ABNORMAL LOW

## 2011-01-02 LAB — FIBRINOGEN: Fibrinogen: 468

## 2011-01-02 LAB — VITAMIN B12: Vitamin B-12: 380 (ref 211–911)

## 2011-01-02 LAB — CK TOTAL AND CKMB (NOT AT ARMC): CK, MB: 0.4

## 2011-01-02 LAB — TROPONIN I: Troponin I: 0.02

## 2011-02-05 ENCOUNTER — Ambulatory Visit: Payer: Self-pay

## 2011-03-01 ENCOUNTER — Emergency Department: Payer: Self-pay | Admitting: Emergency Medicine

## 2011-05-08 DIAGNOSIS — R6884 Jaw pain: Secondary | ICD-10-CM | POA: Insufficient documentation

## 2011-05-08 DIAGNOSIS — E119 Type 2 diabetes mellitus without complications: Secondary | ICD-10-CM | POA: Insufficient documentation

## 2011-05-08 DIAGNOSIS — R0789 Other chest pain: Principal | ICD-10-CM | POA: Insufficient documentation

## 2011-05-08 DIAGNOSIS — M79609 Pain in unspecified limb: Secondary | ICD-10-CM | POA: Insufficient documentation

## 2011-05-08 DIAGNOSIS — M25519 Pain in unspecified shoulder: Secondary | ICD-10-CM | POA: Insufficient documentation

## 2011-05-08 DIAGNOSIS — Z7982 Long term (current) use of aspirin: Secondary | ICD-10-CM | POA: Insufficient documentation

## 2011-05-08 DIAGNOSIS — Z79899 Other long term (current) drug therapy: Secondary | ICD-10-CM | POA: Insufficient documentation

## 2011-05-08 DIAGNOSIS — I10 Essential (primary) hypertension: Secondary | ICD-10-CM | POA: Insufficient documentation

## 2011-05-09 ENCOUNTER — Other Ambulatory Visit (HOSPITAL_COMMUNITY): Payer: Self-pay | Admitting: General Practice

## 2011-05-09 ENCOUNTER — Observation Stay (HOSPITAL_COMMUNITY)
Admission: EM | Admit: 2011-05-09 | Discharge: 2011-05-10 | Disposition: A | Discharge status: Home / Self Care | Payer: Self-pay | Attending: Internal Medicine | Admitting: Internal Medicine

## 2011-05-09 ENCOUNTER — Emergency Department (HOSPITAL_COMMUNITY): Payer: Self-pay

## 2011-05-09 ENCOUNTER — Other Ambulatory Visit: Payer: Self-pay

## 2011-05-09 ENCOUNTER — Encounter (HOSPITAL_COMMUNITY): Payer: Self-pay

## 2011-05-09 DIAGNOSIS — R079 Chest pain, unspecified: Secondary | ICD-10-CM | POA: Diagnosis present

## 2011-05-09 DIAGNOSIS — R072 Precordial pain: Secondary | ICD-10-CM

## 2011-05-09 DIAGNOSIS — E119 Type 2 diabetes mellitus without complications: Secondary | ICD-10-CM | POA: Diagnosis present

## 2011-05-09 DIAGNOSIS — I10 Essential (primary) hypertension: Secondary | ICD-10-CM | POA: Diagnosis present

## 2011-05-09 HISTORY — DX: Essential (primary) hypertension: I10

## 2011-05-09 HISTORY — DX: Disease of pericardium, unspecified: I31.9

## 2011-05-09 HISTORY — DX: Malignant neoplasm of unspecified kidney, except renal pelvis: C64.9

## 2011-05-09 HISTORY — DX: Morbid (severe) obesity due to excess calories: E66.01

## 2011-05-09 HISTORY — DX: Acute embolism and thrombosis of unspecified deep veins of unspecified lower extremity: I82.409

## 2011-05-09 LAB — CBC
HCT: 36.5 % (ref 36.0–46.0)
Hemoglobin: 12.3 g/dL (ref 12.0–15.0)
MCH: 29.5 pg (ref 26.0–34.0)
MCHC: 33.7 g/dL (ref 30.0–36.0)
MCV: 87.5 fL (ref 78.0–100.0)
Platelets: 347 10*3/uL (ref 150–400)
RBC: 4.17 MIL/uL (ref 3.87–5.11)
RDW: 14.7 % (ref 11.5–15.5)
WBC: 7.7 10*3/uL (ref 4.0–10.5)

## 2011-05-09 LAB — BASIC METABOLIC PANEL
BUN: 17 mg/dL (ref 6–23)
CO2: 22 mEq/L (ref 19–32)
Calcium: 10 mg/dL (ref 8.4–10.5)
Chloride: 98 mEq/L (ref 96–112)
Creatinine, Ser: 0.97 mg/dL (ref 0.50–1.10)
GFR calc Af Amer: 81 mL/min — ABNORMAL LOW (ref >90)
GFR calc non Af Amer: 69 mL/min — ABNORMAL LOW (ref >90)
Glucose, Bld: 102 mg/dL — ABNORMAL HIGH (ref 70–99)
Potassium: 4.4 mEq/L (ref 3.5–5.1)
Sodium: 132 mEq/L — ABNORMAL LOW (ref 135–145)

## 2011-05-09 LAB — CARDIAC PANEL(CRET KIN+CKTOT+MB+TROPI)
CK, MB: 1.1 ng/mL (ref 0.3–4.0)
CK, MB: 1.2 ng/mL (ref 0.3–4.0)
Relative Index: INVALID (ref 0.0–2.5)
Relative Index: INVALID (ref 0.0–2.5)
Total CK: 62 U/L (ref 7–177)
Total CK: 61 U/L (ref 7–177)

## 2011-05-09 LAB — GLUCOSE, CAPILLARY
Glucose-Capillary: 110 mg/dL — ABNORMAL HIGH (ref 70–99)
Glucose-Capillary: 164 mg/dL — ABNORMAL HIGH (ref 70–99)

## 2011-05-09 LAB — TROPONIN I: Troponin I: <0.3 ng/mL (ref <0.30)

## 2011-05-09 LAB — PRO B NATRIURETIC PEPTIDE: Pro B Natriuretic peptide (BNP): 5 pg/mL (ref 0–125)

## 2011-05-09 LAB — POCT I-STAT TROPONIN I: Troponin i, poc: 0 ng/mL (ref 0.00–0.08)

## 2011-05-09 LAB — POCT PREGNANCY, URINE: Preg Test, Ur: NEGATIVE

## 2011-05-09 MED ORDER — ASPIRIN 81 MG PO CHEW
324.0000 mg | CHEWABLE_TABLET | Freq: Once | ORAL | Status: AC
  Administered 2011-05-09: 324 mg via ORAL
  Filled 2011-05-09: qty 4

## 2011-05-09 MED ORDER — DIPHENHYDRAMINE HCL 25 MG PO CAPS
25.0000 mg | ORAL_CAPSULE | Freq: Four times a day (QID) | ORAL | Status: DC | PRN
  Administered 2011-05-09 – 2011-05-10 (×3): 25 mg via ORAL
  Filled 2011-05-09 (×3): qty 1

## 2011-05-09 MED ORDER — HYDROCHLOROTHIAZIDE 25 MG PO TABS
25.0000 mg | ORAL_TABLET | Freq: Every day | ORAL | Status: DC
  Administered 2011-05-09 – 2011-05-10 (×2): 25 mg via ORAL
  Filled 2011-05-09 (×2): qty 1

## 2011-05-09 MED ORDER — ONDANSETRON HCL 4 MG PO TABS: 4.0000 mg | ORAL_TABLET | Freq: Four times a day (QID) | ORAL | Status: DC | PRN

## 2011-05-09 MED ORDER — ASPIRIN 325 MG PO TABS: 325.0000 mg | ORAL_TABLET | ORAL | Status: DC

## 2011-05-09 MED ORDER — PANTOPRAZOLE SODIUM 40 MG PO TBEC
80.0000 mg | DELAYED_RELEASE_TABLET | Freq: Every day | ORAL | Status: DC
  Administered 2011-05-09: 80 mg via ORAL
  Filled 2011-05-09 (×2): qty 2

## 2011-05-09 MED ORDER — SODIUM CHLORIDE 0.9 % IV SOLN: INTRAVENOUS | Status: DC

## 2011-05-09 MED ORDER — SODIUM CHLORIDE 0.9 % IV SOLN: 250.0000 mL | INTRAVENOUS | Status: DC | PRN

## 2011-05-09 MED ORDER — ACETAMINOPHEN 650 MG RE SUPP: 650.0000 mg | Freq: Four times a day (QID) | RECTAL | Status: DC | PRN

## 2011-05-09 MED ORDER — SENNA 8.6 MG PO TABS
1.0000 | ORAL_TABLET | Freq: Two times a day (BID) | ORAL | Status: DC
  Administered 2011-05-09 – 2011-05-10 (×3): 8.6 mg via ORAL
  Filled 2011-05-09 (×3): qty 1

## 2011-05-09 MED ORDER — SODIUM CHLORIDE 0.9 % IV BOLUS (SEPSIS)
1000.0000 mL | Freq: Once | INTRAVENOUS | Status: AC
  Administered 2011-05-09: 1000 mL via INTRAVENOUS

## 2011-05-09 MED ORDER — SODIUM CHLORIDE 0.9 % IJ SOLN
3.0000 mL | Freq: Two times a day (BID) | INTRAMUSCULAR | Status: DC
  Administered 2011-05-09 – 2011-05-10 (×3): 3 mL via INTRAVENOUS

## 2011-05-09 MED ORDER — ONDANSETRON HCL 4 MG/2ML IJ SOLN: 4.0000 mg | Freq: Four times a day (QID) | INTRAMUSCULAR | Status: DC | PRN

## 2011-05-09 MED ORDER — ACETAMINOPHEN 325 MG PO TABS
650.0000 mg | ORAL_TABLET | Freq: Four times a day (QID) | ORAL | Status: DC | PRN
  Administered 2011-05-09: 650 mg via ORAL
  Filled 2011-05-09: qty 2

## 2011-05-09 MED ORDER — INSULIN ASPART 100 UNIT/ML ~~LOC~~ SOLN
0.0000 [IU] | Freq: Three times a day (TID) | SUBCUTANEOUS | Status: DC
  Administered 2011-05-09: 3 [IU] via SUBCUTANEOUS
  Filled 2011-05-09: qty 3

## 2011-05-09 MED ORDER — NITROGLYCERIN 0.4 MG SL SUBL
0.4000 mg | SUBLINGUAL_TABLET | SUBLINGUAL | Status: DC | PRN
  Administered 2011-05-09 (×2): 0.4 mg via SUBLINGUAL
  Filled 2011-05-09: qty 25

## 2011-05-09 MED ORDER — ONDANSETRON HCL 4 MG/2ML IJ SOLN
4.0000 mg | Freq: Once | INTRAMUSCULAR | Status: AC
  Administered 2011-05-09: 4 mg via INTRAVENOUS
  Filled 2011-05-09: qty 2

## 2011-05-09 MED ORDER — OXYCODONE-ACETAMINOPHEN 5-325 MG PO TABS
1.0000 | ORAL_TABLET | Freq: Four times a day (QID) | ORAL | Status: DC | PRN
  Administered 2011-05-09 – 2011-05-10 (×3): 2 via ORAL
  Filled 2011-05-09 (×3): qty 2

## 2011-05-09 MED ORDER — ASPIRIN EC 325 MG PO TBEC
325.0000 mg | DELAYED_RELEASE_TABLET | Freq: Every day | ORAL | Status: DC
  Administered 2011-05-09 – 2011-05-10 (×2): 325 mg via ORAL
  Filled 2011-05-09 (×2): qty 1

## 2011-05-09 MED ORDER — ALBUTEROL SULFATE (5 MG/ML) 0.5% IN NEBU: 2.5000 mg | INHALATION_SOLUTION | Freq: Four times a day (QID) | RESPIRATORY_TRACT | Status: DC | PRN

## 2011-05-09 MED ORDER — SODIUM CHLORIDE 0.9 % IJ SOLN
3.0000 mL | Freq: Two times a day (BID) | INTRAMUSCULAR | Status: DC
  Administered 2011-05-09 – 2011-05-10 (×2): 3 mL via INTRAVENOUS

## 2011-05-09 MED ORDER — SODIUM CHLORIDE 0.9 % IJ SOLN: 3.0000 mL | INTRAMUSCULAR | Status: DC | PRN

## 2011-05-09 MED ORDER — HEPARIN SODIUM (PORCINE) 5000 UNIT/ML IJ SOLN
5000.0000 [IU] | Freq: Three times a day (TID) | INTRAMUSCULAR | Status: DC
  Administered 2011-05-09 – 2011-05-10 (×4): 5000 [IU] via SUBCUTANEOUS
  Filled 2011-05-09 (×7): qty 1

## 2011-05-09 MED ORDER — MORPHINE SULFATE 2 MG/ML IJ SOLN
1.0000 mg | INTRAMUSCULAR | Status: DC | PRN
  Administered 2011-05-09 – 2011-05-10 (×4): 1 mg via INTRAVENOUS
  Filled 2011-05-09 (×4): qty 1

## 2011-05-09 MED ORDER — CARVEDILOL 12.5 MG PO TABS
12.5000 mg | ORAL_TABLET | Freq: Two times a day (BID) | ORAL | Status: DC
  Administered 2011-05-09 – 2011-05-10 (×3): 12.5 mg via ORAL
  Filled 2011-05-09 (×4): qty 1

## 2011-05-09 MED ORDER — IOHEXOL 350 MG/ML SOLN
100.0000 mL | Freq: Once | INTRAVENOUS | Status: AC | PRN
  Administered 2011-05-09: 100 mL via INTRAVENOUS

## 2011-05-09 MED ORDER — LISINOPRIL 10 MG PO TABS
10.0000 mg | ORAL_TABLET | Freq: Every day | ORAL | Status: DC
  Administered 2011-05-09 – 2011-05-10 (×2): 10 mg via ORAL
  Filled 2011-05-09 (×2): qty 1

## 2011-05-09 MED ORDER — POLYETHYLENE GLYCOL 3350 17 G PO PACK
17.0000 g | PACK | Freq: Every day | ORAL | Status: DC
  Administered 2011-05-09 – 2011-05-10 (×2): 17 g via ORAL
  Filled 2011-05-09 (×2): qty 1

## 2011-05-09 MED ORDER — DOCUSATE SODIUM 100 MG PO CAPS
100.0000 mg | ORAL_CAPSULE | Freq: Two times a day (BID) | ORAL | Status: DC
  Administered 2011-05-09 – 2011-05-10 (×3): 100 mg via ORAL
  Filled 2011-05-09 (×4): qty 1

## 2011-05-09 MED ORDER — CALCIUM CARBONATE ANTACID 500 MG PO CHEW
2.0000 | CHEWABLE_TABLET | Freq: Three times a day (TID) | ORAL | Status: DC
  Administered 2011-05-09 – 2011-05-10 (×4): 400 mg via ORAL
  Filled 2011-05-09 (×6): qty 2

## 2011-05-09 MED ORDER — ONDANSETRON HCL 4 MG/2ML IJ SOLN: 4.0000 mg | Freq: Three times a day (TID) | INTRAMUSCULAR | Status: DC | PRN

## 2011-05-09 MED ORDER — MORPHINE SULFATE 4 MG/ML IJ SOLN
6.0000 mg | Freq: Once | INTRAMUSCULAR | Status: AC
  Administered 2011-05-09: 6 mg via INTRAVENOUS
  Filled 2011-05-09: qty 2

## 2011-05-09 NOTE — Progress Notes (Signed)
  Echocardiogram 2D Echocardiogram has been performed.  Denise Davis Nira Retort 05/09/2011, 3:24 PM

## 2011-05-09 NOTE — ED Notes (Signed)
Patient transported to X-ray 

## 2011-05-09 NOTE — Progress Notes (Signed)
UR complete 

## 2011-05-09 NOTE — ED Notes (Signed)
EKG given to EDP, [place]. 

## 2011-05-09 NOTE — ED Provider Notes (Signed)
History    46 year old female with chest pain. Gradual onset while laying in bed tonight. Pain radiates into the right shoulder and right upper arm. Feels achy in quality. Dyspnea. Nausea but no vomiting. Was diaphoretic. Currently has the pain but somewhat improved. No history similar type pain. Does have history of DVT shortly after operation years ago. Is not currently anticoagulated. No fevers or chills. No cough. Patient states she is also been feeling short of breath of the past 2 days. Noted today she was particularly short of breath when she was walking from the parking lot to the gymnasium to see her son play basketball. This is a walk she makeson  a routine basis without any difficulty. Patient with multiple risk factors for CAD including hypertension, morbid obesity and diabetes. Denies smoking history.  CSN: [id number]  Arrival date & time 05/08/11  2359   First MD Initiated Contact with Patient 05/09/11 0034      No chief complaint on file.   (Consider location/radiation/quality/duration/timing/severity/associated sxs/prior treatment) HPI  Past Medical History  Diagnosis Date  . Diabetes mellitus   . Hypertension     No past surgical history on file.  No family history on file.  History  Substance Use Topics  . Smoking status: Not on file  . Smokeless tobacco: Not on file  . Alcohol Use:     OB History    Grav Para Term Preterm Abortions TAB SAB Ect Mult Living                  Review of Systems   Review of symptoms negative unless otherwise noted in HPI.   Allergies  Review of patient's allergies indicates no known allergies.  Home Medications   Current Outpatient Rx  Name Route Sig Dispense Refill  . ASPIRIN EC 81 MG PO TBEC Oral Take 81 mg by mouth daily.    Marland Kitchen CALCIUM CARBONATE-VITAMIN D 500-200 MG-UNIT PO TABS Oral Take 1 tablet by mouth daily.    Marland Kitchen CARVEDILOL 12.5 MG PO TABS Oral Take 12.5 mg by mouth 2 (two) times daily with a meal.    .  ESOMEPRAZOLE MAGNESIUM 40 MG PO CPDR Oral Take 40 mg by mouth daily before breakfast.    . GLIPIZIDE 5 MG PO TABS Oral Take 2.5 mg by mouth 2 (two) times daily before a meal.    . HYDROCHLOROTHIAZIDE 25 MG PO TABS Oral Take 25 mg by mouth daily.    Marland Kitchen LISINOPRIL 10 MG PO TABS Oral Take 10 mg by mouth daily.    Marland Kitchen METFORMIN HCL 1000 MG PO TABS Oral Take 1,000 mg by mouth 2 (two) times daily with a meal.      SpO2 100%  Physical Exam  Nursing note and vitals reviewed. Constitutional: She appears well-developed and well-nourished. No distress.  HENT:  Head: Normocephalic and atraumatic.  Eyes: Conjunctivae are normal. Right eye exhibits no discharge. Left eye exhibits no discharge.  Neck: Neck supple.  Cardiovascular: Normal rate, regular rhythm and normal heart sounds.  Exam reveals no gallop and no friction rub.   No murmur heard. Pulmonary/Chest: Effort normal and breath sounds normal. No respiratory distress.  Abdominal: Soft. She exhibits no distension. There is no tenderness.  Musculoskeletal: She exhibits no edema and no tenderness.  Neurological: She is alert.  Skin: Skin is warm and dry.  Psychiatric: She has a normal mood and affect. Her behavior is normal. Thought content normal.    ED Course  Procedures (including critical  care time)  Labs Reviewed  BASIC METABOLIC PANEL - Abnormal; Notable for the following:    Sodium 132 (*)    Glucose, Bld 102 (*)    GFR calc non Af Amer 69 (*)    GFR calc Af Amer 81 (*)    All other components within normal limits  CBC  PRO B NATRIURETIC PEPTIDE  TROPONIN I  POCT I-STAT TROPONIN I   Dg Chest 2 View  05/09/2011  *RADIOLOGY REPORT*  Clinical Data: Chest pain  CHEST - 2 VIEW  Comparison: 12/09/2009  Findings: Lungs clear.  Heart size and pulmonary vascularity normal.  No effusion.  Visualized bones unremarkable.  IMPRESSION: No acute disease  Original Report Authenticated By: Osa Craver, M.D.   Dg Chest 2 View  05/09/2011   *RADIOLOGY REPORT*  Clinical Data: Chest pain  CHEST - 2 VIEW  Comparison: 12/09/2009  Findings: Lungs clear.  Heart size and pulmonary vascularity normal.  No effusion.  Visualized bones unremarkable.  IMPRESSION: No acute disease  Original Report Authenticated By: Osa Craver, M.D.   Ct Angio Chest W/cm &/or Wo Cm  05/09/2011  *RADIOLOGY REPORT*  Clinical Data: Chest pain  CT ANGIOGRAPHY CHEST  Technique:  Multidetector CT imaging of the chest using the standard protocol during bolus administration of intravenous contrast. Multiplanar reconstructed images including MIPs were obtained and reviewed to evaluate the vascular anatomy.  Contrast: OMNIPAQUE IOHEXOL 350 MG/ML IV SOLN  Comparison: 05/09/2011 radiograph, 04/14/2009 CT  Findings: Due in part to contrast bolus timing and in part to respiratory motion/patient body habitus, suboptimal evaluation of the peripheral branches.  No main or lobar branch pulmonary arterial filling defect.  Normal caliber aorta.  Normal heart size. No pleural or pericardial effusion.  No intrathoracic lymphadenopathy.  Limited images through the upper abdomen show no acute abnormality.  Central airways are patent.  Lungs are predominately clear, with mild bibasilar atelectasis and small areas of air trapping. No pneumothorax.  No acute osseous abnormality.  IMPRESSION: No main or lobar branch pulmonary embolism.  Suboptimal evaluation of the peripheral branches.  Mild lung base atelectasis and air trapping.  Otherwise, no acute intrathoracic process identified.  Original Report Authenticated By: Waneta Martins, M.D.   EKG:  Rhythm: Sinus Rate: 104 Axis: Normal axis Intervals: ST segments: Nonspecific ST changes. There is T-wave inversion in v2 Comparison: Aside from rate, there is little  change from prior on 12/09/2009.  1. Diabetes mellitus   2. Hypertension   3. Chest pain       MDM  46 year old female with chest pain. Consider ACS. Patient  does have multiple risk factors. EKG is a sinus rhythm with nonspecific changes. Troponin is within normal limits. Today's CT the chest to evaluate for pulmonary embolism which patient does not appear to have. Doubt infectious. Doubt musculoskeletal without history of trauma, overuse or reproducibility on exam. CT not suggestive of dissection although contrast bolus was to evaluate for PE. Given concern for possible ACS and multiple risk factors for CAD do not feel that patient is appropriate for outpatient followup at this time. Will admit for rule out and further evaluation.       Raeford Razor, MD 05/16/11 [phone]

## 2011-05-09 NOTE — Progress Notes (Signed)
   Patient seen and examined.  Patient seen by my colleague Dr. Kaylyn Layer this morning.  Substernal chest pain. First sets of cardiac enzymes and EKG as negative.  History of pericarditis I will check 2-D echocardiogram.  Clint Lipps Pager: [phone] 05/09/2011, 10:36 AM

## 2011-05-09 NOTE — ED Notes (Signed)
Family at bedside. 

## 2011-05-09 NOTE — ED Notes (Signed)
Pt C/O substernal chest pain radiating to right arm. SOB. Denies N&V. HX HTN, DM, DVT.

## 2011-05-09 NOTE — H&P (Signed)
PCP:   Leanna Sato, MD, MD Confirmed with pt  Chief Complaint:  Chest pain  HPI: 45yoF with h/o DM/HL/HTN, morbid obesity presents with chest pain.   Pt was awoken earlier tonight with sharp severe substernal chest pain, radiating to her right arm  and across to her shoulder and into jaw. States it feels like someone stepping on her chest and  tight. Associated with SOB and sweats on awakening. Not positional in nature, not pleuritic. Pain  has not improved with SL NTG, but did with IV pain meds.  In the ED, vitals with minimal tachycardia to 100, otherwise stable. Labs with minimal hypoNa  132, renal 17/0.97, Trop and BNP negative. CBC negative. CXR negative. CTA showed no main or  lobar branch PE, and suboptimal evaluation of peripheral branches, mild lung base atelectasis and  air trapping. Pt was given 325 mg ASA, morphine, SL NTG x2, zofran, 1L NS.   EPIC review shows that pt was last admitted 04/2009 with chest pain and appears to have ruled out  by enzymes at that time as well, although no d/c summary is available. She also had an echo done  in 12/2007 for chest pain which was normal, see below. She also reports that a couple years ago  she was diagnosed with and treated for pericarditis at [facility], but this has not been an ongoing  issue.   Otherwise, pt also reports a 2 wk h/o right shoulder pulsating and squeezing sensation associated  with R arm numbness, but no R arm swelling, for which her outpt MD planned an MRI of her neck.  She also endorses a 2 wk history of feeling very fatigued, and decreased energy. Earlier in the  day she developed difficulty breathing, feeling very winded with walking. ROS o/w negative   Past Medical History  Diagnosis Date  . Diabetes mellitus   . Hypertension   . DVT of leg (deep venous thrombosis) 12/2007    left leg, given warfarin for 6 mos  . Morbid obesity   . Renal cell cancer 2009    S/p nephrectomy  . Pericarditis    Diagnosed/treated at [facility]     Past Surgical History  Procedure Date  . Endometrial ablation 2007  . Tubal ligation     Medications:  HOME MEDS: Reconciled with pt  Prior to Admission medications   Medication Sig Start Date End Date Taking? Authorizing Provider  aspirin EC 81 MG tablet Take 81 mg by mouth daily.   Yes Historical Provider, MD  calcium-vitamin D (OSCAL WITH D) 500-200 MG-UNIT per tablet Take 1 tablet by mouth daily.   Yes Historical Provider, MD  carvedilol (COREG) 12.5 MG tablet Take 12.5 mg by mouth 2 (two) times daily with a meal.   Yes Historical Provider, MD  esomeprazole (NEXIUM) 40 MG capsule Take 40 mg by mouth daily before breakfast.   Yes Historical Provider, MD  glipiZIDE (GLUCOTROL) 5 MG tablet Take 2.5 mg by mouth 2 (two) times daily before a meal.   Yes Historical Provider, MD  hydrochlorothiazide (HYDRODIURIL) 25 MG tablet Take 25 mg by mouth daily.   Yes Historical Provider, MD  lisinopril (PRINIVIL,ZESTRIL) 10 MG tablet Take 10 mg by mouth daily.   Yes Historical Provider, MD  metFORMIN (GLUCOPHAGE) 1000 MG tablet Take 1,000 mg by mouth 2 (two) times daily with a meal.   Yes Historical Provider, MD    Allergies:  Allergies  Allergen Reactions  . Milk-Related Compounds     Diarrhea  and vomiting    Social History:   does not have a smoking history on file. She does not have any smokeless tobacco history on file. She reports that she does not drink alcohol or use illicit drugs. 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 Lives at home and still active, has husband. Never smoker, doesn't drink or do drugs. Still active.   Family History: Family History  Problem Relation Age of Onset  . Hypertension Mother   . Breast cancer Mother   . Heart disease Paternal Grandmother    Physical Exam: Filed Vitals:   05/09/11 0345 05/09/11 0415 05/09/11 0445 05/09/11 0515  BP: 115/62 111/61 124/67 103/54  Pulse: 91  93 89  Resp: 21 23 18 19   SpO2: 100%  100% 100%   Blood pressure  103/54, pulse 89, resp. rate 19, SpO2 100.00%.  Gen: Very obese F in no distress but appears minimally anxious, able to relate history well HEENT: Pupils round and constricted ~2 mm, EOMI, sclera clear and normal apparing. Mouth moist,  normal. Lungs: CTAB no w/c/r, normal exam Heart: RRR, without murmurs, gallops, no tenderness to palpation of her chest. No pain with R arm  rotation Abd: Obese, but soft, non tender, non distended, no facial grimacing, overall benign Extrem: Warm, perfusing well, radials minimally palpable. R arm appears grossly normal, with  increased bulk but no swelling or edema Neuro: Alert, attentive, CN 2-12 intact, moves extremities spontaneously, sits up in bed easily,  grossly non-focal   Labs & Imaging Results for orders placed during the hospital encounter of 05/09/11 (from the past 48 hour(s))  CBC     Status: Normal   Collection Time   05/09/11 12:42 AM      Component Value Range Comment   WBC 7.7  4.0 - 10.5 (K/uL)    RBC 4.17  3.87 - 5.11 (MIL/uL)    Hemoglobin 12.3  12.0 - 15.0 (g/dL)    HCT 04.5  40.9 - 81.1 (%)    MCV 87.5  78.0 - 100.0 (fL)    MCH 29.5  26.0 - 34.0 (pg)    MCHC 33.7  30.0 - 36.0 (g/dL)    RDW 91.4  78.2 - 95.6 (%)    Platelets 347  150 - 400 (K/uL)   BASIC METABOLIC PANEL     Status: Abnormal   Collection Time   05/09/11 12:42 AM      Component Value Range Comment   Sodium 132 (*) 135 - 145 (mEq/L)    Potassium 4.4  3.5 - 5.1 (mEq/L) SLIGHT HEMOLYSIS   Chloride 98  96 - 112 (mEq/L)    CO2 22  19 - 32 (mEq/L)    Glucose, Bld 102 (*) 70 - 99 (mg/dL)    BUN 17  6 - 23 (mg/dL)    Creatinine, Ser 2.13  0.50 - 1.10 (mg/dL)    Calcium 08.6  8.4 - 10.5 (mg/dL)    GFR calc non Af Amer 69 (*) >90 (mL/min)    GFR calc Af Amer 81 (*) >90 (mL/min)   PRO B NATRIURETIC PEPTIDE     Status: Normal   Collection Time   05/09/11 12:42 AM      Component Value Range Comment   Pro B Natriuretic peptide (BNP) 5.0  0 - 125 (pg/mL)   TROPONIN I      Status: Normal   Collection Time   05/09/11 12:42 AM      Component Value Range Comment   Troponin I <0.30  <0.30 (  ng/mL)   POCT I-STAT TROPONIN I     Status: Normal   Collection Time   05/09/11 12:52 AM      Component Value Range Comment   Troponin i, poc 0.00  0.00 - 0.08 (ng/mL)    Comment 3            POCT PREGNANCY, URINE     Status: Normal   Collection Time   05/09/11  3:14 AM      Component Value Range Comment   Preg Test, Ur NEGATIVE  NEGATIVE     Dg Chest 2 View  05/09/2011  *RADIOLOGY REPORT*  Clinical Data: Chest pain  CHEST - 2 VIEW  Comparison: 12/09/2009  Findings: Lungs clear.  Heart size and pulmonary vascularity normal.  No effusion.  Visualized bones unremarkable.  IMPRESSION: No acute disease  Original Report Authenticated By: Osa Craver, M.D.   Ct Angio Chest W/cm &/or Wo Cm  05/09/2011  *RADIOLOGY REPORT*  Clinical Data: Chest pain  CT ANGIOGRAPHY CHEST  Technique:  Multidetector CT imaging of the chest using the standard protocol during bolus administration of intravenous contrast. Multiplanar reconstructed images including MIPs were obtained and reviewed to evaluate the vascular anatomy.  Contrast: OMNIPAQUE IOHEXOL 350 MG/ML IV SOLN  Comparison: 05/09/2011 radiograph, 04/14/2009 CT  Findings: Due in part to contrast bolus timing and in part to respiratory motion/patient body habitus, suboptimal evaluation of the peripheral branches.  No main or lobar branch pulmonary arterial filling defect.  Normal caliber aorta.  Normal heart size. No pleural or pericardial effusion.  No intrathoracic lymphadenopathy.  Limited images through the upper abdomen show no acute abnormality.  Central airways are patent.  Lungs are predominately clear, with mild bibasilar atelectasis and small areas of air trapping. No pneumothorax.  No acute osseous abnormality.  IMPRESSION: No main or lobar branch pulmonary embolism.  Suboptimal evaluation of the peripheral branches.  Mild lung base  atelectasis and air trapping.  Otherwise, no acute intrathoracic process identified.  Original Report Authenticated By: Waneta Martins, M.D.   ECG: NSR 104 bpm, normal axis, normal p waves, narrow QRS, no ST deviations, flattened TW in V2-3  and aVL, but otherwise normal. Overall, not specifically ischemic appearing.   12/2007 echo SUMMARY  -  Overall left ventricular systolic function was normal. Left        ventricular ejection fraction was estimated to be 65 %. There        were no left ventricular regional wall motion abnormalities.  Impression Present on Admission:  .Chest pain .Diabetes mellitus .Hypertension  45yoF with h/o DM/HL/HTN, morbid obesity presents with chest pain.   1. Chest pain: Pt with typical anginal pain and risk factors, however at present no objective  evidence of myocardial ischemia. She also relates a history of pericarditis a couple years ago,  but neither her story nor EKG are classic for this at present. DDx includes MSK pain from this  right shoulder process she describes, vs known GERD. Pulmonary seems much less likely given  negative CTA.   - Admit observation, rule out MI with enzymes and EKG.  - Increase ASA to 325 until rules out, but will not treat for full ACS  - Continue coreg, HCTZ, lisinopril, PPI. Will give GI cocktail to see if pain improves.   2. Right arm throbbing and pain: Per pt, PCP was planning to work this up with neck MRI for  possible cervical radiculopathy. Unclear if this is related, i.e.  musculoskeletal chest pain, or  not. For this admission, would do ROMI and this can likely be pursued as outpt.   3. Diabetes: holding home Metformin post contrast, and will hold glipizide, use SSI while  admitted.   Telemetry bed, [facility] team 1 Presumed full code  Other plans as per orders.  Eleni Frank 05/09/2011, 5:30 AM

## 2011-05-09 NOTE — ED Notes (Signed)
Pt sts saw PCP yesterday ref pain and numbness in right arm. States was told possible vessel blockage in her neck and if pain/numbness returned, to come to the ED.

## 2011-05-10 LAB — BASIC METABOLIC PANEL
CO2: 24 mEq/L (ref 19–32)
Calcium: 9.5 mg/dL (ref 8.4–10.5)
Creatinine, Ser: 1.07 mg/dL (ref 0.50–1.10)
GFR calc non Af Amer: 62 mL/min — ABNORMAL LOW (ref >90)
Sodium: 134 mEq/L — ABNORMAL LOW (ref 135–145)

## 2011-05-10 LAB — CBC
MCH: 29.2 pg (ref 26.0–34.0)
MCV: 88.7 fL (ref 78.0–100.0)
Platelets: 290 10*3/uL (ref 150–400)
RBC: 3.9 MIL/uL (ref 3.87–5.11)
RDW: 15 % (ref 11.5–15.5)
WBC: 5.1 10*3/uL (ref 4.0–10.5)

## 2011-05-10 LAB — GLUCOSE, CAPILLARY: Glucose-Capillary: 80 mg/dL (ref 70–99)

## 2011-05-10 MED ORDER — OXYCODONE-ACETAMINOPHEN 5-325 MG PO TABS: 1.0000 | ORAL_TABLET | Freq: Four times a day (QID) | ORAL | Status: AC | PRN

## 2011-05-10 NOTE — Discharge Summary (Signed)
HOSPITAL DISCHARGE SUMMARY  Denise Davis  MRN: [id number]  DOB:1965/07/21  Date of Admission: 05/09/2011 Date of Discharge: 05/10/2011         LOS: 1 day   Attending Physician:Clara Smolen A  Patient's WUJ:WJXBJ,YNWGN M, MD, MD  Consults: None  Discharge Diagnoses: Present on Admission:  .Chest pain .Diabetes mellitus .Hypertension   Medication List  As of 05/10/2011 10:47 AM   TAKE these medications         aspirin EC 81 MG tablet   Take 81 mg by mouth daily.      calcium-vitamin D 500-200 MG-UNIT per tablet   Commonly known as: OSCAL WITH D   Take 1 tablet by mouth daily.      carvedilol 12.5 MG tablet   Commonly known as: COREG   Take 12.5 mg by mouth 2 (two) times daily with a meal.      esomeprazole 40 MG capsule   Commonly known as: NEXIUM   Take 40 mg by mouth daily before breakfast.      glipiZIDE 5 MG tablet   Commonly known as: GLUCOTROL   Take 2.5 mg by mouth 2 (two) times daily before a meal.      hydrochlorothiazide 25 MG tablet   Commonly known as: HYDRODIURIL   Take 25 mg by mouth daily.      lisinopril 10 MG tablet   Commonly known as: PRINIVIL,ZESTRIL   Take 10 mg by mouth daily.      metFORMIN 1000 MG tablet   Commonly known as: GLUCOPHAGE   Take 1,000 mg by mouth 2 (two) times daily with a meal.      oxyCODONE-acetaminophen 5-325 MG per tablet   Commonly known as: PERCOCET   Take 1-2 tablets by mouth every 6 (six) hours as needed.             Brief Admission History: 45yoF with h/o DM/HL/HTN, morbid obesity presents with chest pain. Pt was awoken earlier tonight with sharp severe substernal chest pain, radiating to her right arm and across to her shoulder and into jaw. States it feels like someone stepping on her chest and tight. Associated with SOB and sweats on awakening. Not positional in nature, not pleuritic. Pain has not improved with SL NTG, but did with IV pain meds. In the ED, vitals with minimal tachycardia to 100,otherwise  stable. Labs with minimal hypoNa 132, renal 17/0.97, Trop and BNP negative. CBC negative. CXR negative. CTA showed no main or lobar branch PE, and suboptimal evaluation of peripheral branches, mild lung base atelectasis and air trapping. Pt was given 325 mg ASA, morphine, SL NTG x2, zofran, 1L NS. EPIC review shows that pt was last admitted 04/2009 with chest pain and appears to have ruled out by enzymes at that time as well, although no d/c summary is available. She also had an echo done in 12/2007 for chest pain which was normal, see below. She also reports that a couple years ago she was diagnosed with and treated for pericarditis at [facility], but this has not been an ongoing issue. Otherwise, pt also reports a 2 wk h/o right shoulder pulsating and squeezing sensation associated with R arm numbness, but no R arm swelling, for which her outpt MD planned an MRI of her neck. She also endorses a 2 wk history of feeling very fatigued, and decreased energy. Earlier in the day she developed difficulty breathing, feeling very winded with walking. ROS o/w negative  Hospital Course: Present on Admission:  .Chest  pain .Diabetes mellitus .Hypertension   1. Chest pain: Patient admitted for substernal chest pain. Because of she is obese CTA of the chest was done and showed had no main or lobar rash pulmonary embolism. No evidence of lung disease. Patient given aspirin morphine sublingual nitrate without any marked relief. 3 sets of cardiac enzymes and EKG were negative. Patient did have history of pericarditis so 2-D echocardiogram was obtained which showed normal LVEF and no evidence of fluid around the heart. No EKG evidence of pericarditis. Patient was complaining about 8/10 pain at the time of discharge the patient was prescribed some Percocet and followup with her primary care physician. I did not feel that her pain was cardiac at all, patient did have normal echo cardiac enzymes and EKG. She had PPI for acid reflux  and that was continued. Patient can be referred as outpatient to a cardiologist if pain persists, for consideration of stress test.  2. DM 2: Patient follows with her primary care physician. Her medication was continued. At the time of admission metformin was held because she got contrast media for the CTA. But she was merely euglycemic during the hospital stay.  3. HTN: Blood pressure is controlled no changes were done to her blood pressure medication.   Day of Discharge BP 106/70  Pulse 77  Temp(Src) 98.5 F (36.9 C) (Oral)  Resp 18  Ht 5\' 5"  (1.651 m)  Wt 153.316 kg (338 lb)  BMI 56.25 kg/m2  SpO2 100% Physical Exam: GEN: No acute distress, cooperative with exam PSYCH: He is alert and oriented x4; does not appear anxious does not appear depressed; affect is normal  HEENT: Mucous membranes pink and anicteric;  Mouth: without oral thrush or lesions Eyes: PERRLA; EOM intact;  Neck: no cervical lymphadenopathy nor thyromegaly or carotid bruit; no JVD;  CHEST WALL: No tenderness, symmetrical to breathing bilaterally CHEST: Normal respiration, clear to auscultation bilaterally  HEART: Regular rate and rhythm; no murmurs, rubs or gallops, S1 and S2 heard  BACK: No kyphosis or scoliosis; no CVA tenderness  ABDOMEN:  soft non-tender; no masses, no organomegaly, normal abdominal bowel sounds; no pannus; no intertriginous candida.  EXTREMITIES: No bone or joint deformity; no edema; no ulcerations.  PULSES: 2+ and symmetric, neurovascularity is intact SKIN: Normal hydration no rash or ulceration, no flushing or suspicious lesions  CNS: Cranial nerves 2-12 grossly intact no focal neurologic deficit, coordination is intact gait not tested    Results for orders placed during the hospital encounter of 05/09/11 (from the past 24 hour(s))  GLUCOSE, CAPILLARY     Status: Abnormal   Collection Time   05/09/11 11:26 AM      Component Value Range   Glucose-Capillary 164 (*) 70 - 99 (mg/dL)    Comment 1 Notify RN    CARDIAC PANEL(CRET KIN+CKTOT+MB+TROPI)     Status: Normal   Collection Time   05/09/11  1:55 PM      Component Value Range   Total CK 62  7 - 177 (U/L)   CK, MB 1.2  0.3 - 4.0 (ng/mL)   Troponin I <0.30  <0.30 (ng/mL)   Relative Index RELATIVE INDEX IS INVALID  0.0 - 2.5   GLUCOSE, CAPILLARY     Status: Normal   Collection Time   05/09/11  4:40 PM      Component Value Range   Glucose-Capillary 98  70 - 99 (mg/dL)  GLUCOSE, CAPILLARY     Status: Abnormal   Collection Time  05/09/11 10:16 PM      Component Value Range   Glucose-Capillary 123 (*) 70 - 99 (mg/dL)  CARDIAC PANEL(CRET KIN+CKTOT+MB+TROPI)     Status: Normal   Collection Time   05/09/11 10:40 PM      Component Value Range   Total CK 61  7 - 177 (U/L)   CK, MB 1.2  0.3 - 4.0 (ng/mL)   Troponin I <0.30  <0.30 (ng/mL)   Relative Index RELATIVE INDEX IS INVALID  0.0 - 2.5   BASIC METABOLIC PANEL     Status: Abnormal   Collection Time   05/10/11  4:40 AM      Component Value Range   Sodium 134 (*) 135 - 145 (mEq/L)   Potassium 4.3  3.5 - 5.1 (mEq/L)   Chloride 101  96 - 112 (mEq/L)   CO2 24  19 - 32 (mEq/L)   Glucose, Bld 117 (*) 70 - 99 (mg/dL)   BUN 17  6 - 23 (mg/dL)   Creatinine, Ser 9.60  0.50 - 1.10 (mg/dL)   Calcium 9.5  8.4 - 45.4 (mg/dL)   GFR calc non Af Amer 62 (*) >90 (mL/min)   GFR calc Af Amer 72 (*) >90 (mL/min)  CBC     Status: Abnormal   Collection Time   05/10/11  4:40 AM      Component Value Range   WBC 5.1  4.0 - 10.5 (K/uL)   RBC 3.90  3.87 - 5.11 (MIL/uL)   Hemoglobin 11.4 (*) 12.0 - 15.0 (g/dL)   HCT 09.8 (*) 11.9 - 46.0 (%)   MCV 88.7  78.0 - 100.0 (fL)   MCH 29.2  26.0 - 34.0 (pg)   MCHC 32.9  30.0 - 36.0 (g/dL)   RDW 14.7  82.9 - 56.2 (%)   Platelets 290  150 - 400 (K/uL)  GLUCOSE, CAPILLARY     Status: Normal   Collection Time   05/10/11  7:56 AM      Component Value Range   Glucose-Capillary 80  70 - 99 (mg/dL)    Disposition: Home   Follow-up Appts: Discharge  Orders    Future Orders Please Complete By Expires   Diet Carb Modified      Increase activity slowly         Follow-up Information    Follow up with Leanna Sato, MD. Schedule an appointment as soon as possible for a visit in 1 week.   Contact information:   [facility] [address] [place] [address] [phone]          I spent 40 minutes completing paperwork and coordinating discharge efforts.  SignedClydia Llano A 05/10/2011, 10:47 AM

## 2011-05-15 DIAGNOSIS — Z86718 Personal history of other venous thrombosis and embolism: Secondary | ICD-10-CM | POA: Insufficient documentation

## 2011-05-17 DIAGNOSIS — G542 Cervical root disorders, not elsewhere classified: Secondary | ICD-10-CM | POA: Insufficient documentation

## 2011-07-27 ENCOUNTER — Emergency Department: Payer: Self-pay | Admitting: Emergency Medicine

## 2011-07-31 ENCOUNTER — Emergency Department: Payer: Self-pay

## 2011-07-31 LAB — CBC
HCT: 34.4 % — ABNORMAL LOW (ref 35.0–47.0)
HGB: 11.4 g/dL — ABNORMAL LOW (ref 12.0–16.0)
MCH: 29.3 pg (ref 26.0–34.0)
MCV: 89 fL (ref 80–100)
Platelet: 287 10*3/uL (ref 150–440)
WBC: 7.5 10*3/uL (ref 3.6–11.0)

## 2011-07-31 LAB — BASIC METABOLIC PANEL
Anion Gap: 9 (ref 7–16)
BUN: 18 mg/dL (ref 7–18)
Chloride: 106 mmol/L (ref 98–107)
Creatinine: 0.94 mg/dL (ref 0.60–1.30)
EGFR (African American): >60
Glucose: 143 mg/dL — ABNORMAL HIGH (ref 65–99)
Osmolality: 280 (ref 275–301)
Potassium: 4.3 mmol/L (ref 3.5–5.1)
Sodium: 138 mmol/L (ref 136–145)

## 2011-08-06 DIAGNOSIS — B351 Tinea unguium: Secondary | ICD-10-CM | POA: Insufficient documentation

## 2011-08-06 DIAGNOSIS — M7660 Achilles tendinitis, unspecified leg: Secondary | ICD-10-CM | POA: Insufficient documentation

## 2011-08-06 DIAGNOSIS — L03039 Cellulitis of unspecified toe: Secondary | ICD-10-CM | POA: Insufficient documentation

## 2011-11-11 ENCOUNTER — Emergency Department: Payer: Self-pay | Admitting: Emergency Medicine

## 2011-11-11 LAB — URINALYSIS, COMPLETE
Bilirubin,UR: NEGATIVE
Blood: NEGATIVE
Glucose,UR: NEGATIVE mg/dL (ref 0–75)
Leukocyte Esterase: NEGATIVE
Ph: 6 (ref 4.5–8.0)
RBC,UR: 1 /HPF (ref 0–5)
WBC UR: 1 /HPF (ref 0–5)

## 2011-11-11 LAB — CBC
HCT: 36.1 % (ref 35.0–47.0)
MCH: 29 pg (ref 26.0–34.0)
MCV: 87 fL (ref 80–100)
RBC: 4.13 10*6/uL (ref 3.80–5.20)
RDW: 15 % — ABNORMAL HIGH (ref 11.5–14.5)
WBC: 5.4 10*3/uL (ref 3.6–11.0)

## 2011-11-11 LAB — BASIC METABOLIC PANEL
BUN: 14 mg/dL (ref 7–18)
Calcium, Total: 9.1 mg/dL (ref 8.5–10.1)
Chloride: 101 mmol/L (ref 98–107)
Co2: 34 mmol/L — ABNORMAL HIGH (ref 21–32)
Creatinine: 0.94 mg/dL (ref 0.60–1.30)
EGFR (African American): >60
Osmolality: 277 (ref 275–301)
Potassium: 3.9 mmol/L (ref 3.5–5.1)

## 2011-12-03 DIAGNOSIS — M791 Myalgia, unspecified site: Secondary | ICD-10-CM | POA: Insufficient documentation

## 2011-12-05 ENCOUNTER — Ambulatory Visit: Payer: 59 | Admitting: Physical Therapy

## 2011-12-06 ENCOUNTER — Ambulatory Visit: Payer: Worker's Compensation | Attending: Unknown Physician Specialty | Admitting: Physical Therapy

## 2011-12-06 DIAGNOSIS — R42 Dizziness and giddiness: Secondary | ICD-10-CM | POA: Insufficient documentation

## 2011-12-06 DIAGNOSIS — IMO0001 Reserved for inherently not codable concepts without codable children: Secondary | ICD-10-CM | POA: Insufficient documentation

## 2011-12-28 ENCOUNTER — Emergency Department: Payer: Self-pay | Admitting: Emergency Medicine

## 2011-12-28 LAB — URINALYSIS, COMPLETE
Bilirubin,UR: NEGATIVE
Blood: NEGATIVE
Glucose,UR: NEGATIVE mg/dL (ref 0–75)
Ketone: NEGATIVE
Leukocyte Esterase: NEGATIVE
Nitrite: NEGATIVE
Ph: 6 (ref 4.5–8.0)
Protein: NEGATIVE
Specific Gravity: 1.014 (ref 1.003–1.030)
Squamous Epithelial: 5

## 2011-12-28 LAB — CBC
HGB: 13.1 g/dL (ref 12.0–16.0)
MCH: 29.1 pg (ref 26.0–34.0)
Platelet: 357 10*3/uL (ref 150–440)
RBC: 4.49 10*6/uL (ref 3.80–5.20)
WBC: 5.8 10*3/uL (ref 3.6–11.0)

## 2011-12-28 LAB — COMPREHENSIVE METABOLIC PANEL
Albumin: 3.5 g/dL (ref 3.4–5.0)
BUN: 14 mg/dL (ref 7–18)
Calcium, Total: 9.4 mg/dL (ref 8.5–10.1)
Chloride: 103 mmol/L (ref 98–107)
Co2: 26 mmol/L (ref 21–32)
Glucose: 94 mg/dL (ref 65–99)
Potassium: 4 mmol/L (ref 3.5–5.1)
Sodium: 140 mmol/L (ref 136–145)

## 2011-12-28 LAB — LIPASE, BLOOD: Lipase: 163 U/L (ref 73–393)

## 2011-12-31 ENCOUNTER — Ambulatory Visit: Payer: Self-pay | Attending: Family Medicine | Admitting: Physical Therapy

## 2011-12-31 DIAGNOSIS — IMO0001 Reserved for inherently not codable concepts without codable children: Secondary | ICD-10-CM | POA: Insufficient documentation

## 2011-12-31 DIAGNOSIS — R42 Dizziness and giddiness: Secondary | ICD-10-CM | POA: Insufficient documentation

## 2012-01-01 DIAGNOSIS — M79606 Pain in leg, unspecified: Secondary | ICD-10-CM | POA: Insufficient documentation

## 2012-01-07 ENCOUNTER — Emergency Department: Payer: Self-pay | Admitting: Emergency Medicine

## 2012-01-07 LAB — URINALYSIS, COMPLETE
Bacteria: NONE SEEN
Bilirubin,UR: NEGATIVE
Glucose,UR: NEGATIVE mg/dL (ref 0–75)
Ketone: NEGATIVE
Protein: NEGATIVE
RBC,UR: 1 /HPF (ref 0–5)
Specific Gravity: 1.016 (ref 1.003–1.030)
WBC UR: 1 /HPF (ref 0–5)

## 2012-01-07 LAB — COMPREHENSIVE METABOLIC PANEL
Anion Gap: 9 (ref 7–16)
BUN: 15 mg/dL (ref 7–18)
Bilirubin,Total: 0.3 mg/dL (ref 0.2–1.0)
Calcium, Total: 8.7 mg/dL (ref 8.5–10.1)
Chloride: 103 mmol/L (ref 98–107)
Co2: 27 mmol/L (ref 21–32)
EGFR (African American): >60
Osmolality: 280 (ref 275–301)
Potassium: 3.6 mmol/L (ref 3.5–5.1)
SGOT(AST): 16 U/L (ref 15–37)
SGPT (ALT): 17 U/L (ref 12–78)
Total Protein: 7.6 g/dL (ref 6.4–8.2)

## 2012-01-07 LAB — CBC
HCT: 36.8 % (ref 35.0–47.0)
HGB: 12.2 g/dL (ref 12.0–16.0)
MCH: 29.1 pg (ref 26.0–34.0)
MCHC: 33.1 g/dL (ref 32.0–36.0)
MCV: 88 fL (ref 80–100)
RBC: 4.18 10*6/uL (ref 3.80–5.20)

## 2012-01-07 LAB — LIPASE, BLOOD: Lipase: 274 U/L (ref 73–393)

## 2012-01-16 ENCOUNTER — Ambulatory Visit: Payer: Self-pay | Admitting: Physical Therapy

## 2012-02-11 DIAGNOSIS — Z9889 Other specified postprocedural states: Secondary | ICD-10-CM | POA: Insufficient documentation

## 2012-03-07 ENCOUNTER — Emergency Department (HOSPITAL_COMMUNITY): Payer: No Typology Code available for payment source

## 2012-03-07 ENCOUNTER — Emergency Department (HOSPITAL_COMMUNITY)
Admission: EM | Admit: 2012-03-07 | Discharge: 2012-03-07 | Disposition: A | Discharge status: Home / Self Care | Payer: No Typology Code available for payment source | Attending: Emergency Medicine | Admitting: Emergency Medicine

## 2012-03-07 ENCOUNTER — Encounter (HOSPITAL_COMMUNITY): Payer: Self-pay | Admitting: *Deleted

## 2012-03-07 DIAGNOSIS — W010XXA Fall on same level from slipping, tripping and stumbling without subsequent striking against object, initial encounter: Secondary | ICD-10-CM | POA: Insufficient documentation

## 2012-03-07 DIAGNOSIS — R269 Unspecified abnormalities of gait and mobility: Secondary | ICD-10-CM | POA: Insufficient documentation

## 2012-03-07 DIAGNOSIS — Z87891 Personal history of nicotine dependence: Secondary | ICD-10-CM | POA: Insufficient documentation

## 2012-03-07 DIAGNOSIS — I10 Essential (primary) hypertension: Secondary | ICD-10-CM | POA: Insufficient documentation

## 2012-03-07 DIAGNOSIS — Z79899 Other long term (current) drug therapy: Secondary | ICD-10-CM | POA: Insufficient documentation

## 2012-03-07 DIAGNOSIS — S8391XA Sprain of unspecified site of right knee, initial encounter: Secondary | ICD-10-CM

## 2012-03-07 DIAGNOSIS — S8002XA Contusion of left knee, initial encounter: Secondary | ICD-10-CM

## 2012-03-07 DIAGNOSIS — Z8679 Personal history of other diseases of the circulatory system: Secondary | ICD-10-CM | POA: Insufficient documentation

## 2012-03-07 DIAGNOSIS — E119 Type 2 diabetes mellitus without complications: Secondary | ICD-10-CM | POA: Insufficient documentation

## 2012-03-07 DIAGNOSIS — Y929 Unspecified place or not applicable: Secondary | ICD-10-CM | POA: Insufficient documentation

## 2012-03-07 DIAGNOSIS — S8000XA Contusion of unspecified knee, initial encounter: Secondary | ICD-10-CM | POA: Insufficient documentation

## 2012-03-07 DIAGNOSIS — Z7982 Long term (current) use of aspirin: Secondary | ICD-10-CM | POA: Insufficient documentation

## 2012-03-07 DIAGNOSIS — Y9389 Activity, other specified: Secondary | ICD-10-CM | POA: Insufficient documentation

## 2012-03-07 DIAGNOSIS — Z85528 Personal history of other malignant neoplasm of kidney: Secondary | ICD-10-CM | POA: Insufficient documentation

## 2012-03-07 DIAGNOSIS — IMO0002 Reserved for concepts with insufficient information to code with codable children: Secondary | ICD-10-CM | POA: Insufficient documentation

## 2012-03-07 MED ORDER — HYDROMORPHONE HCL PF 1 MG/ML IJ SOLN
1.0000 mg | Freq: Once | INTRAMUSCULAR | Status: AC
  Administered 2012-03-07: 1 mg via INTRAMUSCULAR
  Filled 2012-03-07: qty 1

## 2012-03-07 MED ORDER — OXYCODONE-ACETAMINOPHEN 5-325 MG PO TABS: 1.0000 | ORAL_TABLET | ORAL | Status: DC | PRN

## 2012-03-07 MED ORDER — NAPROXEN 500 MG PO TABS: 500.0000 mg | ORAL_TABLET | Freq: Two times a day (BID) | ORAL | Status: DC

## 2012-03-07 NOTE — ED Notes (Signed)
Pt slipped and fell on the floor. C/o right knee pain. Pt was not able to ambulate at the scene

## 2012-03-07 NOTE — ED Notes (Signed)
Bed:WA23<BR> Expected date:<BR> Expected time:<BR> Means of arrival:<BR> Comments:<BR>

## 2012-03-07 NOTE — ED Provider Notes (Signed)
History     CSN: [id number]  Arrival date & time 03/07/12  0114   First MD Initiated Contact with Patient 03/07/12 [phone]      Chief Complaint  Patient presents with  . Fall    (Consider location/radiation/quality/duration/timing/severity/associated sxs/prior treatment) HPI Comments: 46 her old female presents after slipping and falling on the floor. She states that her right knee twisted in an abnormal way and her left knee fell straight down and hit the medial surface of the knee on the floor. She was able to get back up all by herself and walk back to her seat but over the next hour she became more and more tender and in pain. She was unable to ambulate on paramedic arrival and required ambulance transport.  The patient denies any numbness weakness or tingling to the feet or lower extremities. She denies hitting her head and has no neck or back pain. This pain is constant, gradually getting worse.  Patient is a 46 y.o. female presenting with fall. The history is provided by the patient and a relative.  Fall    Past Medical History  Diagnosis Date  . Diabetes mellitus   . Hypertension   . DVT of leg (deep venous thrombosis) 12/2007    left leg, given warfarin for 6 mos  . Morbid obesity   . Renal cell cancer 2009    S/p nephrectomy  . Pericarditis     Diagnosed/treated at [facility]     Past Surgical History  Procedure Date  . Endometrial ablation 2007  . Tubal ligation     Family History  Problem Relation Age of Onset  . Hypertension Mother   . Breast cancer Mother   . Heart disease Paternal Grandmother     History  Substance Use Topics  . Smoking status: Former Games developer  . Smokeless tobacco: Never Used  . Alcohol Use: No    OB History    Grav Para Term Preterm Abortions TAB SAB Ect Mult Living                  Review of Systems  Musculoskeletal: Positive for gait problem. Negative for back pain.  Skin: Negative for wound.    Allergies  Milk-related  compounds  Home Medications   Current Outpatient Rx  Name  Route  Sig  Dispense  Refill  . AMITRIPTYLINE HCL 25 MG PO TABS   Oral   Take 50 mg by mouth at bedtime.         . ASPIRIN EC 81 MG PO TBEC   Oral   Take 81 mg by mouth daily.         Marland Kitchen CARVEDILOL 12.5 MG PO TABS   Oral   Take 12.5 mg by mouth 2 (two) times daily with a meal.         . CYCLOBENZAPRINE HCL 10 MG PO TABS   Oral   Take 10 mg by mouth 3 (three) times daily as needed. For muscle spasms         . ESOMEPRAZOLE MAGNESIUM 40 MG PO CPDR   Oral   Take 40 mg by mouth daily before breakfast.         . GABAPENTIN 300 MG PO CAPS   Oral   Take 300 mg by mouth 2 (two) times daily.         Marland Kitchen GABAPENTIN 600 MG PO TABS   Oral   Take 2,400 mg by mouth at bedtime.         Marland Kitchen  GLIPIZIDE 5 MG PO TABS   Oral   Take 2.5 mg by mouth 2 (two) times daily before a meal.         . HYDROCHLOROTHIAZIDE 25 MG PO TABS   Oral   Take 25 mg by mouth daily.         Marland Kitchen HYOSCYAMINE SULFATE 0.125 MG PO TBDP   Sublingual   Place 0.125 mg under the tongue every 6 (six) hours as needed. For stomach cramps         . LISINOPRIL 10 MG PO TABS   Oral   Take 10 mg by mouth daily.         Marland Kitchen LORATADINE 10 MG PO TABS   Oral   Take 5 mg by mouth daily.         Marland Kitchen LOVASTATIN 20 MG PO TABS   Oral   Take 20 mg by mouth daily.         Marland Kitchen METFORMIN HCL 1000 MG PO TABS   Oral   Take 1,000 mg by mouth 2 (two) times daily with a meal.         . PROMETHAZINE HCL 50 MG PO TABS   Oral   Take 50 mg by mouth every 6 (six) hours as needed. For nausea         . NAPROXEN 500 MG PO TABS   Oral   Take 1 tablet (500 mg total) by mouth 2 (two) times daily with a meal.   30 tablet   0   . OXYCODONE-ACETAMINOPHEN 5-325 MG PO TABS   Oral   Take 1 tablet by mouth every 4 (four) hours as needed for pain.   20 tablet   0     BP 144/97  Pulse 104  Temp 99.5 F (37.5 C) (Oral)  Resp 18  SpO2 100%  Physical Exam   Nursing note and vitals reviewed. Constitutional: She appears well-developed and well-nourished. No distress.  HENT:  Head: Normocephalic and atraumatic.  Eyes: Conjunctivae normal are normal. No scleral icterus.  Cardiovascular: Normal rate, regular rhythm and intact distal pulses.   Pulmonary/Chest: Effort normal and breath sounds normal.  Musculoskeletal: She exhibits tenderness ( Tenderness over the medial left and right knees). She exhibits no edema.       The patient states that her pain is too severe to lift either leg off the bed, she is morbidly obese and has very large legs, palpation of the distal quadricep muscles does not appear to be tender, she does have tenderness in the medial knee joint bilaterally.  Neurological: She is alert.  Skin: Skin is warm and dry. No rash noted. She is not diaphoretic.    ED Course  Procedures (including critical care time)  Labs Reviewed - No data to display Dg Knee Complete 4 Views Left  03/07/2012  *RADIOLOGY REPORT*  Clinical Data: Fall, knee pain.  LEFT KNEE - COMPLETE 4+ VIEW  Comparison: Right knee performed today.  Findings: Moderate degenerative changes with joint space narrowing and osteophyte formation. No acute bony abnormality.  Specifically, no fracture, subluxation, or dislocation.  Soft tissues are intact. No joint effusion.  IMPRESSION: No acute bony abnormality.   Original Report Authenticated By: Charlett Nose, M.D.    Dg Knee Complete 4 Views Right  03/07/2012  *RADIOLOGY REPORT*  Clinical Data: Fall, knee pain.  RIGHT KNEE - COMPLETE 4+ VIEW  Comparison: Left knee performed today.  Findings: Moderate degenerative changes with joint space narrowing and osteophyte  formation. No acute bony abnormality.  Specifically, no fracture, subluxation, or dislocation.  Soft tissues are intact. No joint effusion.  IMPRESSION: No acute bony abnormality.   Original Report Authenticated By: Charlett Nose, M.D.      1. Contusion of left knee   2.  Sprain of right knee       MDM  Given that the patient was able to ambulate spontaneously after the fall, I doubt that there has been a quadriceps rupture, she is able to move both of her ankles without any difficulty but resists moving her knees at all secondary to pain. Her x-rays show no signs of effusions or fractures, I will place her in a knee immobilizer, give her crutches and orthopedic followup, discharged with Naprosyn and Percocet. She has been given Dilaudid intramuscular prior to discharge.        Vida Roller, MD 03/07/12 [phone]

## 2012-04-01 HISTORY — PX: INSERTION OF MESH: SHX5868

## 2012-04-02 ENCOUNTER — Other Ambulatory Visit: Payer: Self-pay

## 2012-04-02 LAB — COMPREHENSIVE METABOLIC PANEL
Albumin: 3.3 g/dL — ABNORMAL LOW (ref 3.4–5.0)
Calcium, Total: 8.9 mg/dL (ref 8.5–10.1)
Chloride: 103 mmol/L (ref 98–107)
Co2: 28 mmol/L (ref 21–32)
Creatinine: 0.93 mg/dL (ref 0.60–1.30)
EGFR (Non-African Amer.): >60
Glucose: 116 mg/dL — ABNORMAL HIGH (ref 65–99)
Osmolality: 275 (ref 275–301)
Potassium: 4 mmol/L (ref 3.5–5.1)
SGPT (ALT): 23 U/L (ref 12–78)
Sodium: 137 mmol/L (ref 136–145)
Total Protein: 7.6 g/dL (ref 6.4–8.2)

## 2012-04-23 DIAGNOSIS — E162 Hypoglycemia, unspecified: Secondary | ICD-10-CM | POA: Insufficient documentation

## 2012-08-16 ENCOUNTER — Emergency Department (HOSPITAL_COMMUNITY): Payer: Self-pay

## 2012-08-16 ENCOUNTER — Observation Stay (HOSPITAL_COMMUNITY)
Admission: EM | Admit: 2012-08-16 | Discharge: 2012-08-19 | Disposition: A | Discharge status: Home / Self Care | Payer: Self-pay | Attending: Internal Medicine | Admitting: Internal Medicine

## 2012-08-16 ENCOUNTER — Encounter (HOSPITAL_COMMUNITY): Payer: Self-pay | Admitting: Nurse Practitioner

## 2012-08-16 DIAGNOSIS — R0603 Acute respiratory distress: Secondary | ICD-10-CM | POA: Diagnosis present

## 2012-08-16 DIAGNOSIS — R061 Stridor: Secondary | ICD-10-CM

## 2012-08-16 DIAGNOSIS — Z905 Acquired absence of kidney: Secondary | ICD-10-CM | POA: Insufficient documentation

## 2012-08-16 DIAGNOSIS — Z22322 Carrier or suspected carrier of Methicillin resistant Staphylococcus aureus: Secondary | ICD-10-CM | POA: Insufficient documentation

## 2012-08-16 DIAGNOSIS — R062 Wheezing: Secondary | ICD-10-CM | POA: Diagnosis present

## 2012-08-16 DIAGNOSIS — R079 Chest pain, unspecified: Secondary | ICD-10-CM | POA: Diagnosis present

## 2012-08-16 DIAGNOSIS — K219 Gastro-esophageal reflux disease without esophagitis: Secondary | ICD-10-CM | POA: Insufficient documentation

## 2012-08-16 DIAGNOSIS — Z79899 Other long term (current) drug therapy: Secondary | ICD-10-CM | POA: Insufficient documentation

## 2012-08-16 DIAGNOSIS — E119 Type 2 diabetes mellitus without complications: Secondary | ICD-10-CM | POA: Diagnosis present

## 2012-08-16 DIAGNOSIS — R491 Aphonia: Secondary | ICD-10-CM | POA: Insufficient documentation

## 2012-08-16 DIAGNOSIS — Z85528 Personal history of other malignant neoplasm of kidney: Secondary | ICD-10-CM | POA: Insufficient documentation

## 2012-08-16 DIAGNOSIS — R0602 Shortness of breath: Secondary | ICD-10-CM | POA: Diagnosis present

## 2012-08-16 DIAGNOSIS — F411 Generalized anxiety disorder: Secondary | ICD-10-CM

## 2012-08-16 DIAGNOSIS — J383 Other diseases of vocal cords: Principal | ICD-10-CM | POA: Diagnosis present

## 2012-08-16 DIAGNOSIS — M5430 Sciatica, unspecified side: Secondary | ICD-10-CM | POA: Insufficient documentation

## 2012-08-16 DIAGNOSIS — R490 Dysphonia: Secondary | ICD-10-CM | POA: Insufficient documentation

## 2012-08-16 DIAGNOSIS — R0789 Other chest pain: Secondary | ICD-10-CM | POA: Insufficient documentation

## 2012-08-16 DIAGNOSIS — J984 Other disorders of lung: Secondary | ICD-10-CM

## 2012-08-16 DIAGNOSIS — Z6841 Body Mass Index (BMI) 40.0 and over, adult: Secondary | ICD-10-CM | POA: Insufficient documentation

## 2012-08-16 DIAGNOSIS — Z86718 Personal history of other venous thrombosis and embolism: Secondary | ICD-10-CM | POA: Insufficient documentation

## 2012-08-16 DIAGNOSIS — I10 Essential (primary) hypertension: Secondary | ICD-10-CM | POA: Diagnosis present

## 2012-08-16 HISTORY — DX: Anxiety disorder, unspecified: F41.9

## 2012-08-16 HISTORY — DX: Personal history of other diseases of the digestive system: Z87.19

## 2012-08-16 LAB — CBC WITH DIFFERENTIAL/PLATELET
Basophils Absolute: 0 10*3/uL (ref 0.0–0.1)
Basophils Relative: 1 % (ref 0–1)
Eosinophils Absolute: 0.1 10*3/uL (ref 0.0–0.7)
Eosinophils Relative: 2 % (ref 0–5)
MCH: 28.4 pg (ref 26.0–34.0)
MCHC: 32.8 g/dL (ref 30.0–36.0)
Neutrophils Relative %: 45 % (ref 43–77)
Platelets: 318 10*3/uL (ref 150–400)
RBC: 4.16 MIL/uL (ref 3.87–5.11)
RDW: 15.3 % (ref 11.5–15.5)

## 2012-08-16 LAB — BASIC METABOLIC PANEL
Calcium: 9.7 mg/dL (ref 8.4–10.5)
GFR calc Af Amer: 76 mL/min — ABNORMAL LOW (ref >90)
GFR calc non Af Amer: 66 mL/min — ABNORMAL LOW (ref >90)
Potassium: 3.8 mEq/L (ref 3.5–5.1)
Sodium: 136 mEq/L (ref 135–145)

## 2012-08-16 LAB — HEPATIC FUNCTION PANEL
Bilirubin, Direct: 0.1 mg/dL (ref 0.0–0.3)
Indirect Bilirubin: 0.2 mg/dL — ABNORMAL LOW (ref 0.3–0.9)
Total Bilirubin: 0.3 mg/dL (ref 0.3–1.2)

## 2012-08-16 LAB — RAPID URINE DRUG SCREEN, HOSP PERFORMED
Benzodiazepines: POSITIVE — AB
Cocaine: NOT DETECTED

## 2012-08-16 MED ORDER — KETOROLAC TROMETHAMINE 30 MG/ML IJ SOLN
30.0000 mg | Freq: Once | INTRAMUSCULAR | Status: AC
  Administered 2012-08-16: 30 mg via INTRAVENOUS
  Filled 2012-08-16: qty 1

## 2012-08-16 MED ORDER — AMITRIPTYLINE HCL 25 MG PO TABS
25.0000 mg | ORAL_TABLET | Freq: Every day | ORAL | Status: DC
  Administered 2012-08-16 – 2012-08-18 (×3): 25 mg via ORAL
  Filled 2012-08-16 (×4): qty 4

## 2012-08-16 MED ORDER — HYDROCHLOROTHIAZIDE 25 MG PO TABS
25.0000 mg | ORAL_TABLET | Freq: Every day | ORAL | Status: DC
  Administered 2012-08-16 – 2012-08-19 (×4): 25 mg via ORAL
  Filled 2012-08-16 (×4): qty 1

## 2012-08-16 MED ORDER — RACEPINEPHRINE HCL 2.25 % IN NEBU
INHALATION_SOLUTION | RESPIRATORY_TRACT | Status: AC
  Filled 2012-08-16: qty 0.5

## 2012-08-16 MED ORDER — KETOROLAC TROMETHAMINE 30 MG/ML IJ SOLN
30.0000 mg | Freq: Three times a day (TID) | INTRAMUSCULAR | Status: DC | PRN
Start: 2012-08-16 — End: 2012-08-16

## 2012-08-16 MED ORDER — SODIUM CHLORIDE 0.9 % IJ SOLN
3.0000 mL | Freq: Two times a day (BID) | INTRAMUSCULAR | Status: DC
  Administered 2012-08-17 – 2012-08-18 (×3): 3 mL via INTRAVENOUS

## 2012-08-16 MED ORDER — ASPIRIN EC 81 MG PO TBEC
81.0000 mg | DELAYED_RELEASE_TABLET | Freq: Every day | ORAL | Status: DC
  Administered 2012-08-16 – 2012-08-19 (×4): 81 mg via ORAL
  Filled 2012-08-16 (×4): qty 1

## 2012-08-16 MED ORDER — ONDANSETRON HCL 4 MG/2ML IJ SOLN: 4.0000 mg | Freq: Four times a day (QID) | INTRAMUSCULAR | Status: DC | PRN

## 2012-08-16 MED ORDER — SODIUM CHLORIDE 0.9 % IJ SOLN
3.0000 mL | Freq: Two times a day (BID) | INTRAMUSCULAR | Status: DC
  Administered 2012-08-16 – 2012-08-18 (×3): 3 mL via INTRAVENOUS

## 2012-08-16 MED ORDER — HYDROXYZINE HCL 50 MG PO TABS
50.0000 mg | ORAL_TABLET | Freq: Two times a day (BID) | ORAL | Status: DC | PRN
  Filled 2012-08-16: qty 1

## 2012-08-16 MED ORDER — DEXAMETHASONE SODIUM PHOSPHATE 4 MG/ML IJ SOLN
4.0000 mg | Freq: Once | INTRAMUSCULAR | Status: AC
  Administered 2012-08-16: 4 mg via INTRAVENOUS
  Filled 2012-08-16: qty 1

## 2012-08-16 MED ORDER — LISINOPRIL 10 MG PO TABS
10.0000 mg | ORAL_TABLET | Freq: Every day | ORAL | Status: DC
  Administered 2012-08-16: 10 mg via ORAL
  Filled 2012-08-16 (×2): qty 1

## 2012-08-16 MED ORDER — INSULIN ASPART 100 UNIT/ML ~~LOC~~ SOLN: 0.0000 [IU] | Freq: Every day | SUBCUTANEOUS | Status: DC

## 2012-08-16 MED ORDER — INSULIN ASPART 100 UNIT/ML ~~LOC~~ SOLN
0.0000 [IU] | Freq: Three times a day (TID) | SUBCUTANEOUS | Status: DC
  Administered 2012-08-17 – 2012-08-18 (×2): 2 [IU] via SUBCUTANEOUS
  Administered 2012-08-18 (×2): 1 [IU] via SUBCUTANEOUS
  Administered 2012-08-19: 2 [IU] via SUBCUTANEOUS
  Administered 2012-08-19: 1 [IU] via SUBCUTANEOUS

## 2012-08-16 MED ORDER — GABAPENTIN 600 MG PO TABS
600.0000 mg | ORAL_TABLET | Freq: Every day | ORAL | Status: DC
  Administered 2012-08-16 – 2012-08-18 (×3): 600 mg via ORAL
  Filled 2012-08-16 (×4): qty 4

## 2012-08-16 MED ORDER — RACEPINEPHRINE HCL 2.25 % IN NEBU
0.5000 mL | INHALATION_SOLUTION | Freq: Once | RESPIRATORY_TRACT | Status: AC
  Administered 2012-08-16: 0.5 mL via RESPIRATORY_TRACT

## 2012-08-16 MED ORDER — ONDANSETRON HCL 4 MG PO TABS: 4.0000 mg | ORAL_TABLET | Freq: Four times a day (QID) | ORAL | Status: DC | PRN

## 2012-08-16 MED ORDER — CARVEDILOL 12.5 MG PO TABS
12.5000 mg | ORAL_TABLET | Freq: Two times a day (BID) | ORAL | Status: DC
  Administered 2012-08-17 – 2012-08-19 (×5): 12.5 mg via ORAL
  Filled 2012-08-16 (×7): qty 1

## 2012-08-16 MED ORDER — DIAZEPAM 5 MG PO TABS
ORAL_TABLET | ORAL | Status: AC
  Filled 2012-08-16: qty 1

## 2012-08-16 MED ORDER — DIAZEPAM 5 MG PO TABS
2.5000 mg | ORAL_TABLET | Freq: Three times a day (TID) | ORAL | Status: DC | PRN
  Administered 2012-08-16: 5 mg via ORAL

## 2012-08-16 MED ORDER — ENOXAPARIN SODIUM 80 MG/0.8ML ~~LOC~~ SOLN
80.0000 mg | SUBCUTANEOUS | Status: DC
  Administered 2012-08-16 – 2012-08-18 (×3): 80 mg via SUBCUTANEOUS
  Filled 2012-08-16 (×4): qty 0.8

## 2012-08-16 MED ORDER — SODIUM CHLORIDE 0.9 % IV SOLN: 250.0000 mL | INTRAVENOUS | Status: DC | PRN

## 2012-08-16 MED ORDER — DIAZEPAM 5 MG PO TABS
5.0000 mg | ORAL_TABLET | Freq: Once | ORAL | Status: AC
  Administered 2012-08-16: 5 mg via ORAL
  Filled 2012-08-16: qty 1

## 2012-08-16 MED ORDER — LORAZEPAM 2 MG/ML IJ SOLN
1.0000 mg | Freq: Once | INTRAMUSCULAR | Status: AC
  Administered 2012-08-16: 1 mg via INTRAVENOUS
  Filled 2012-08-16: qty 1

## 2012-08-16 MED ORDER — SODIUM CHLORIDE 0.9 % IJ SOLN: 3.0000 mL | INTRAMUSCULAR | Status: DC | PRN

## 2012-08-16 NOTE — ED Notes (Signed)
Pt wheezing decreased- audible with stethoscope

## 2012-08-16 NOTE — ED Notes (Addendum)
Pt appears moderately distressed. Respiratory rate 25 breaths per minute- airway intact. Pt unable to speak 3-4 words. Pt has audible wheezing. Expiratory wheezing audible in trachea and bronchioles- diminished in bases. RT and josh PA at bedside. No tongue or throat swelling visible. HOB @ high fowlers. Pt on neb tx with EMS. Pt diaphoretic

## 2012-08-16 NOTE — ED Notes (Signed)
Per EMS called out for difficulty breathinghas hx of asthma- had some SOB in church used albuterol inhaler with no relief. Wheezing bilateral lungs- diminished in bases and posterior. Respiratory rate 30bpm. Has chest tightness with breathing. - no 12 lead abnormlaities. sts some thightness in airway- but no difficulty swallowing- airway in tact. EMS GIVEN- 5mg  albuterol and 0.5mg  atrovent. O2sats- 94% on scene.

## 2012-08-16 NOTE — Progress Notes (Signed)
md notified about pts request for pain medication. Vs  155/88 hr 108 tep 99.6  RR 17.  Will continue to monitor. Emilie [place]

## 2012-08-16 NOTE — H&P (Signed)
Hospital Admission Note Date: 08/16/2012  Patient name: Denise Davis Medical record number: [id number] Date of birth: 1966/02/08 Age: 47 y.o. Gender: female PCP: Leanna Sato, MD  Medical Service: Internal Medicine  Attending physician: Dr. Rogelia Boga     1st Contact: Dr. Shirlee Latch [phone] 2nd Contact: Dr. Dierdre Searles Pager:[phone] After 5 pm or weekends: 1st Contact:  Pager: [phone] 2nd Contact:  Pager: [phone]  Chief Complaint: chest pain/tightness with breathing, shortness of breath, wheezing   History of Present Illness (per husband and nonverbal patient): 86 y.o no history of asthma, morbid obesity presents for upper chest pain/tightness worse breathing in and out.  Pain is 8/10 without radiation.  She also complains of shortness of breath, wheezing, and hoarseness.  This is the patients third episode with these symptoms.  Symptoms started around 4 am on the day of admission.  She used her Albuterol inhaler 2 puffs x 2 with some relief then went to church.  While in church she started wheezing again in the middle of service, experienced chest tightness, increased shortness of breath, and voice change.  So far breathing treatment did with Duobeb, Racepinephrine nebulizer has not helped in the ED and she feels symptoms are worsening.  She denies anxiety though she is on anxiolytics at home.  She denies sick contacts.   Meds: Current Outpatient Rx  Name  Route  Sig  Dispense  Refill  . amitriptyline (ELAVIL) 25 MG tablet   Oral   Take 25-100 mg by mouth at bedtime.          Marland Kitchen aspirin EC 81 MG tablet   Oral   Take 81 mg by mouth daily.         . carvedilol (COREG) 12.5 MG tablet   Oral   Take 12.5 mg by mouth 2 (two) times daily with a meal.         . diazepam (VALIUM) 5 MG tablet   Oral   Take 2.5-5 mg by mouth every 8 (eight) hours as needed for anxiety or sleep.         Marland Kitchen gabapentin (NEURONTIN) 600 MG tablet   Oral   Take 600-2,400 mg by mouth at bedtime. Start with  600 mg and may take up to 2400 mg         . glipiZIDE (GLUCOTROL) 5 MG tablet   Oral   Take 2.5 mg by mouth 2 (two) times daily before a meal.         . hydrochlorothiazide (HYDRODIURIL) 25 MG tablet   Oral   Take 25 mg by mouth daily.         Marland Kitchen HYDROmorphone (DILAUDID) 2 MG tablet   Oral   Take 2 mg by mouth every 4 (four) hours as needed for pain (severe pain).         . hydrOXYzine (ATARAX/VISTARIL) 50 MG tablet   Oral   Take 50 mg by mouth 2 (two) times daily as needed. Take 1 tablet (50 mg total) by mouth 2 (two) times daily as needed for Itching.         Marland Kitchen lisinopril (PRINIVIL,ZESTRIL) 10 MG tablet   Oral   Take 10 mg by mouth daily.         . metFORMIN (GLUCOPHAGE) 1000 MG tablet   Oral   Take 1,000 mg by mouth 2 (two) times daily with a meal.         . promethazine (PHENERGAN) 50 MG tablet   Oral   Take 50  mg by mouth every 6 (six) hours as needed. For nausea           Allergies: Allergies as of 08/16/2012 - Review Complete 08/16/2012  Allergen Reaction Noted  . Milk-related compounds  05/09/2011   Past Medical History  Diagnosis Date  . Diabetes mellitus   . Hypertension   . DVT of leg (deep venous thrombosis) 12/2007    left leg, given warfarin for 6 mos  . Morbid obesity   . Renal cell cancer 2009    S/p nephrectomy (left)  . Pericarditis     Diagnosed/treated at [facility]; per patient 4 episodes per cardiologist Dr. [name] persistant chest pain unclear if pericarditis verusus neuropsychogenic    Past Surgical History  Procedure Laterality Date  . Endometrial ablation  2007  . Tubal ligation    . Insertion of mesh  2014  . Umbilical hernia repair     Denies history of asthma   Family History  Problem Relation Age of Onset  . Hypertension Mother   . Breast cancer Mother   . Heart disease Paternal Grandmother    History   Social History  . Marital Status: Married    Spouse Name: N/A    Number of Children: N/A  . Years of  Education: N/A   Occupational History  . Not on file.   Social History Main Topics  . Smoking status: Former Games developer  . Smokeless tobacco: Never Used  . Alcohol Use: No  . Drug Use: No  . Sexually Active: Not on file   Other Topics Concern  . 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    Life stressors (her daughter had a miscarriage, son in trouble in school)          Review of Systems: General: +sweating, +feels hot, denies fever/chills HEENT: denies dysphagia, denies sore throat, +hoarseness  Cardiac: +upper chest pain (worse with breathing in and out, 8/10), +chest tightness  Pulm: +wheezing, +sob, breathing treatments did not help symptoms this time Abd/GU: denies abdominal pain, denies dysuria, denies blood in urine or stool  Ext: denies leg swelling or calf pain Neuro: not assessed  Psych: denies anxiety Other: denies sick contacts  Physical Exam: VS HR 112-115, 97-100% RA, 20s-40s, BP 138/91 (103) Blood pressure 142/90, pulse 110, temperature 99.1 F (37.3 C), temperature source Oral, resp. rate 22, SpO2 100.00%. Vitals reviewed. General: resting in bed, appears anxious HEENT: PERRL b/l, Coram/at, no scleral icterus Cardiac: tachycardic, no rubs, murmurs or gallops, +reproducbile upper chest pain with palpation Pulm: clear to auscultation bilaterally, no wheezes, rales, or rhonchi, +expiratory wheezing noted audibly without scope and with ausculation of neck  Abd: soft, nontender, nondistended, BS present, obese, surgical scar from umbilical hernia repair Ext: warm and well perfused, no pedal edema Neuro: alert and oriented X3, CN grossly neurologically intact moving all 4 extremities   Lab results: Basic Metabolic Panel:  Recent Labs  78/46/96 1205  NA 136  K 3.8  CL 98  CO2 28  GLUCOSE 152*  BUN 14  CREATININE 1.01  CALCIUM 9.7   Liver Function Tests: No results found for this basename:  AST, ALT, ALKPHOS, BILITOT, PROT, ALBUMIN,  in the last 72 hours No results found for this basename: LIPASE, AMYLASE,  in the last 72 hours No results found for this basename: AMMONIA,  in the last 72 hours CBC:  Recent Labs  08/16/12 1205  WBC 5.7  NEUTROABS 2.6  HGB 11.8*  HCT 36.0  MCV 86.5  PLT 318    CBG: No results found for this basename: GLUCAP,  in the last 72 hours Hemoglobin A1C: No results found for this basename: HGBA1C,  in the last 72 hours  Urine Drug Screen: Drugs of Abuse     Component Value Date/Time   LABOPIA NONE DETECTED 08/16/2012 1602    Misc. Labs: lfts  Imaging results:  Dg Neck Soft Tissue  08/16/2012   *RADIOLOGY REPORT*  Clinical Data: Wheezing with voice loss and cough  NECK SOFT TISSUES - 1+ VIEW  Comparison: None.  Findings: Technically challenging due to patient body habitus.  The prevertebral soft tissue contour is within normal limits.  No prevertebral gas is seen.  The hypopharynx is not very distended, and the epiglottis is not discretely seen.  IMPRESSION: Limited exam due to body habitus.  Prevertebral soft tissue contour is within normal limits.   Original Report Authenticated By: Britta Mccreedy, M.D.   Dg Chest Portable 1 View  08/16/2012   *RADIOLOGY REPORT*  Clinical Data: Wheezing.  Short of breath.  PORTABLE CHEST - 1 VIEW  Comparison: 05/09/2011  Findings: Lungs are under aerated.  Normal heart size.  Grossly clear lungs.  No pneumothorax or pleural effusion.  IMPRESSION: No active cardiopulmonary disease.   Original Report Authenticated By: Jolaine Click, M.D.    Other results: EKG: rate 120, ST, normal axis and intervals, no LVH, no ST changes  Assessment & Plan by Problem: 47 y.o no history of asthma, morbid obesity presents for upper chest pain/tightness, shortness of breath, wheezing, and hoarseness.  1. Acute respiratory distress and wheezing  -She is tachycardic and tachypneic.  No history of asthma.  Patient has a history  of anxiety.  No history of intubation. Recent pfts with mild restriction noted.  Reduced FEV1.  DLCO was normal.   -Etiology could be secondary to vocal cord dysfunction versus stridor which she has expiratory and inspiratory wheezes present but predominately inspiratory wheezes versus related to anxiety -She was given Racemic Epi neb and Duoneb, Decadron, Ativan 1 mg in the ED with no improvement in symptoms  -She is on Valium 2.5-5 mg q 8 hours prn, Hydroxyzine 50 mg bid prn which we continued  -She was most recently seen and treated in the [facility] ED 06/2012 with Solmedrol, breathing treatments (Albuterol neb x 2), Zyrtec with improvements in symptoms and most recent episode thought to be due to anxiety/panic  -we spoke with Dr. Kendrick Fries about the case who agreed with getting [name] involved, considered adding Ativan prn for anxiety and trying patient on bipap for worsening respiratory distress  -[name] consulted-Dr. Emeline Darling and will come to evaluate the patient   2. Chest pain  -She has a history of chronic chest pain.  Etiology may be multifactorial seems pleritic, and/or musculoskeletal and/or possibly psychiatric.  Patient has a history of recurrent pericarditis and persistent chest pain (per [facility] records). Spoke with Dr. [name] at [facility] who stated the patient has persistent chest pain with previous normal coronary work up in the past unclear etiology so diagnosed with pericarditis but it may be related to anxiety.  Echo 05/2011 with EF 55-60%.  EKG negative for diffuse ST elevations like in pericarditis  -Rx Toradol 30 mg x 1   3. History of HTN  -BP 124/82 on admission  -Continue HCTZ 25 mg qd, On Coreg 25 mg bid changed to 12.5 mg bid, Lisinopril 10 mg  qd, continue Asprin 81 mg qd  -Monitor VS   4. DM 2 (HA1C 6.2 04/2009) -Hold Metformin 1000 mg bid  -Monitor cbg, SSI  5. History of DVT -Previously treated with Coumadin x 6 months  -Patient is currently asymptomatic without calf pain and saturating  well on room air  6. History of RCC status postnephrectomy  -No history of chemo or radiation status post nephrectomy   7. Anxiety  -Continued On Valium 2.5-5 mg q 8 hours prn and hydroxyzine 50 mg bid prn home dose  8. DVT px -Lovenox   9. F/E/N -will monitor and replace electrolytes prn  -NPO  Dispo: Disposition is deferred at this time, awaiting improvement of current medical problems. Anticipated discharge in approximately 1-2 day(s).   The patient does have a current PCP (MILES,LINDA M, MD), therefore will be requiring OPC follow-up after discharge.   Unknown if the patient has transportation limitations that hinder transportation to clinic appointments.  SignedAnnett Gula [phone] 08/16/2012, 6:08 PM

## 2012-08-16 NOTE — ED Provider Notes (Signed)
History     CSN: [id number]  Arrival date & time 08/16/12  1135   First MD Initiated Contact with Patient 08/16/12 1138      Chief Complaint  Patient presents with  . Wheezing    (Consider location/radiation/quality/duration/timing/severity/associated sxs/prior treatment) HPI Comments: Patient with history of renal cell carcinoma s/p nephrectomy, history of DVT completed a course of warfarin, hypertension, obesity, non insulin dependent DM, recurrent pericarditis, and GERD -- presents with acute wheezing and shortness of breath that gradually became worse since 4AM today. She has had several episodes of similar symptoms recently. ED visit at [facility] 07/20/12, resolved with breathing treatment. PFTs performed in the past week were normal -- she had a similar episode after PFTs which again resolved with breathing treatment. These episodes were thought to have anxiety component. Never had an ICU admission or intubation in the past. The onset of this condition was acute. The course is gradually worsening. Aggravating factors: none. Alleviating factors: none.    The history is provided by the patient and medical records.    Past Medical History  Diagnosis Date  . Diabetes mellitus   . Hypertension   . DVT of leg (deep venous thrombosis) 12/2007    left leg, given warfarin for 6 mos  . Morbid obesity   . Renal cell cancer 2009    S/p nephrectomy  . Pericarditis     Diagnosed/treated at [facility]     Past Surgical History  Procedure Laterality Date  . Endometrial ablation  2007  . Tubal ligation      Family History  Problem Relation Age of Onset  . Hypertension Mother   . Breast cancer Mother   . Heart disease Paternal Grandmother     History  Substance Use Topics  . Smoking status: Former Games developer  . Smokeless tobacco: Never Used  . Alcohol Use: No    OB History   Grav Para Term Preterm Abortions TAB SAB Ect Mult Living                  Review of Systems  Constitutional:  Negative for fever.  HENT: Negative for sore throat and rhinorrhea.   Eyes: Negative for redness.  Respiratory: Positive for shortness of breath and wheezing. Negative for cough.   Cardiovascular: Positive for chest pain.  Gastrointestinal: Negative for nausea, vomiting, abdominal pain and diarrhea.  Genitourinary: Negative for dysuria.  Musculoskeletal: Negative for myalgias.  Skin: Negative for rash.  Neurological: Negative for headaches.    Allergies  Milk-related compounds  Home Medications   Current Outpatient Rx  Name  Route  Sig  Dispense  Refill  . amitriptyline (ELAVIL) 25 MG tablet   Oral   Take 50 mg by mouth at bedtime.         Marland Kitchen aspirin EC 81 MG tablet   Oral   Take 81 mg by mouth daily.         . carvedilol (COREG) 12.5 MG tablet   Oral   Take 12.5 mg by mouth 2 (two) times daily with a meal.         . cyclobenzaprine (FLEXERIL) 10 MG tablet   Oral   Take 10 mg by mouth 3 (three) times daily as needed. For muscle spasms         . esomeprazole (NEXIUM) 40 MG capsule   Oral   Take 40 mg by mouth daily before breakfast.         . gabapentin (NEURONTIN) 300 MG capsule  Oral   Take 300 mg by mouth 2 (two) times daily.         Marland Kitchen gabapentin (NEURONTIN) 600 MG tablet   Oral   Take 2,400 mg by mouth at bedtime.         Marland Kitchen glipiZIDE (GLUCOTROL) 5 MG tablet   Oral   Take 2.5 mg by mouth 2 (two) times daily before a meal.         . hydrochlorothiazide (HYDRODIURIL) 25 MG tablet   Oral   Take 25 mg by mouth daily.         . hyoscyamine (ANASPAZ) 0.125 MG TBDP   Sublingual   Place 0.125 mg under the tongue every 6 (six) hours as needed. For stomach cramps         . lisinopril (PRINIVIL,ZESTRIL) 10 MG tablet   Oral   Take 10 mg by mouth daily.         Marland Kitchen loratadine (CLARITIN) 10 MG tablet   Oral   Take 5 mg by mouth daily.         Marland Kitchen lovastatin (MEVACOR) 20 MG tablet   Oral   Take 20 mg by mouth daily.         . metFORMIN  (GLUCOPHAGE) 1000 MG tablet   Oral   Take 1,000 mg by mouth 2 (two) times daily with a meal.         . naproxen (NAPROSYN) 500 MG tablet   Oral   Take 1 tablet (500 mg total) by mouth 2 (two) times daily with a meal.   30 tablet   0   . oxyCODONE-acetaminophen (PERCOCET) 5-325 MG per tablet   Oral   Take 1 tablet by mouth every 4 (four) hours as needed for pain.   20 tablet   0   . promethazine (PHENERGAN) 50 MG tablet   Oral   Take 50 mg by mouth every 6 (six) hours as needed. For nausea           BP 124/82  Resp 27  SpO2 100%  Physical Exam  Nursing note and vitals reviewed. Constitutional: She appears well-developed and well-nourished.  HENT:  Head: Normocephalic and atraumatic.  Eyes: Conjunctivae are normal. Right eye exhibits no discharge. Left eye exhibits no discharge.  Neck: Normal range of motion. Neck supple.  Cardiovascular: Normal rate, regular rhythm and normal heart sounds.   Pulmonary/Chest: Tachypnea noted. She is in respiratory distress. She has decreased breath sounds. She has wheezes (neck).  Wheezing mostly transmitted from upper airway  Abdominal: Soft. There is no tenderness. There is no rebound and no guarding.  obese  Musculoskeletal: She exhibits no edema and no tenderness.  No LE edema.   Neurological: She is alert.  Skin: Skin is warm and dry.  Psychiatric: She has a normal mood and affect.    ED Course  Procedures (including critical care time)  Labs Reviewed  CBC WITH DIFFERENTIAL - Abnormal; Notable for the following:    Hemoglobin 11.8 (*)    All other components within normal limits  BASIC METABOLIC PANEL - Abnormal; Notable for the following:    Glucose, Bld 152 (*)    GFR calc non Af Amer 66 (*)    GFR calc Af Amer 76 (*)    All other components within normal limits   Dg Neck Soft Tissue  08/16/2012   *RADIOLOGY REPORT*  Clinical Data: Wheezing with voice loss and cough  NECK SOFT TISSUES - 1+ VIEW  Comparison:  None.   Findings: Technically challenging due to patient body habitus.  The prevertebral soft tissue contour is within normal limits.  No prevertebral gas is seen.  The hypopharynx is not very distended, and the epiglottis is not discretely seen.  IMPRESSION: Limited exam due to body habitus.  Prevertebral soft tissue contour is within normal limits.   Original Report Authenticated By: Britta Mccreedy, M.D.   Dg Chest Portable 1 View  08/16/2012   *RADIOLOGY REPORT*  Clinical Data: Wheezing.  Short of breath.  PORTABLE CHEST - 1 VIEW  Comparison: 05/09/2011  Findings: Lungs are under aerated.  Normal heart size.  Grossly clear lungs.  No pneumothorax or pleural effusion.  IMPRESSION: No active cardiopulmonary disease.   Original Report Authenticated By: Jolaine Click, M.D.     1. Shortness of breath     11:49 AM Patient seen and examined. Seen with Dr. Silverio Lay at bedside. Albuterol given. Racemic epi ordered. Records from [facility] reviewed. Of note, patient has cardiologist at [facility] and previous neg workup for chest pain. They did not evaluate for PE at recent ED visit but sx felt not c/w PE at the time.   Vital signs reviewed and are as follows: Filed Vitals:   08/16/12 1143  BP: 124/82  Resp: 27   12:28 PM Patient/exam stable. O2 sat continues to be 100%.    Date: 08/16/2012  Rate: 115  Rhythm: sinus tachycardia  QRS Axis: normal  Intervals: normal  ST/T Wave abnormalities: nonspecific T wave changes  Conduction Disutrbances:none  Narrative Interpretation:   Old EKG Reviewed: changes noted from 05/09/2011, Qtc shorter today  X-ray reviewed by myself. Neck x-ray ordered.   Soft tissue neck negative. On re-exam, pt continues to be tachypneic, tachycardic, and is unable to talk above a whisper, more than a few words at a time. Continues to have upper airway wheezing.   Given she is only mildly improved -- will ask for admission.   I spoke with teaching service resident who will see. Patient's O2 sat has  remained in normal range.    MDM  Upper airway wheezing, tachypnea, tachycardia. Symptoms continue in ED despite tx. She will need to be monitored until resolution. Ddx vocal cord dysfunction, vs anxiety vs psychogenic. Vitals have remained stable. [name] consult may be considered to help determine etiology.         Renne Crigler, PA-C 08/16/12 1529

## 2012-08-16 NOTE — Consult Note (Signed)
Denise Davis, Denise Davis [id number] 09-Mar-1966 Burns [place], MD  Reason for Consult: aphonia, respiratory distress, evaluate for vocal fold dysfunction  HPI: 47yo AAF admitted for "tracheal wheezing", aphonia, respiratory distress unresponsive to usual bronchodilators, [name] consulted for laryngoscopy  Allergies:  Allergies  Allergen Reactions  . Milk-Related Compounds     Diarrhea and vomiting  "only milk causes problems. Milk products are ok"    ROS: aphonia, respiratory distress, wheezing, otherwise negative x 10 systems except per HPI  PMH:  Past Medical History  Diagnosis Date  . Diabetes mellitus   . Hypertension   . DVT of leg (deep venous thrombosis) 12/2007    left leg, given warfarin for 6 mos  . Morbid obesity   . Renal cell cancer 2009    S/p nephrectomy (left)  . Pericarditis     Diagnosed/treated at [facility]; per patient 4 episodes per cardiologist Dr. [name] persistant chest pain unclear if pericarditis verusus neuropsychogenic     FH:  Family History  Problem Relation Age of Onset  . Hypertension Mother   . Breast cancer Mother   . Heart disease Paternal Grandmother     SH:  History   Social History  . Marital Status: Married    Spouse Name: N/A    Number of Children: N/A  . Years of Education: N/A   Occupational History  . Not on file.   Social History Main Topics  . Smoking status: Former Games developer  . Smokeless tobacco: Never Used  . Alcohol Use: No  . Drug Use: No  . Sexually Active: Not on file   Other Topics Concern  . 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 Endometrial ablation  2007  . Tubal ligation    . Insertion of mesh  2014  . Umbilical hernia repair      Physical  Exam: CN 2-12 grossly intact and symmetric. Morbidly  obese EAC/TMs normal BL. Oral cavity, lips, gums, ororpharynx normal with no masses or lesions. Skin warm and dry. Nasal cavity without polyps or purulence. External nose and ears without masses or lesions. EOMI, PERRLA. Neck supple with no masses or lesions. No lymphadenopathy palpated. aphonic  Procedure Note: 31575 Informed verbal consent was obtained after explaining the risks (including bleeding and infection), benefits and alternatives of the procedure. Verbal timeout was performed prior to the procedure. The nose was topicalized with topical surgilube. The 4mm flexible scope was advanced through the right nasal cavity. The septum and turbinates appeared normal. The middle meatus was free of polyps of purulence. The eustachian tube, choana, and adenoids were normal in appearance. The hypopharynx, arytenoids, false vocal folds, and true vocal folds appeared normal with no masses or lesions and a widely patent glottic airway and widely patent subglottic airway. When asked to phonate, the patient paradoxically abducts her vocal folds, when asked to inspire she correctly abducts her vocal folds, and when asked to hum she correctly adducts the vocal folds. The visualized portion of the subglottis appeared normal. The patient tolerated the procedure with no immediate complications.  A/P: classic paradoxical vocal fold motion/vocal fold dysfunction with no actual anatomical airway pathology. Recommend speech/voice therapy, anxiolytics as needed, and consider cognitive behavioral therapy. Will sign off.  Melvenia Beam 08/16/2012 8:51 PM

## 2012-08-16 NOTE — ED Notes (Signed)
Denise Davis- with portable X-ray in room. Verified pt identity with RN

## 2012-08-16 NOTE — ED Notes (Signed)
Pt pointing to midsternal chest sts she cant breathe right there. sts feels like pressure in chest. Silverio Lay MD made aware.

## 2012-08-17 LAB — CBC
Hemoglobin: 11.5 g/dL — ABNORMAL LOW (ref 12.0–15.0)
MCHC: 32.9 g/dL (ref 30.0–36.0)
RBC: 4.04 MIL/uL (ref 3.87–5.11)
WBC: 6.1 10*3/uL (ref 4.0–10.5)

## 2012-08-17 LAB — BASIC METABOLIC PANEL
GFR calc Af Amer: 74 mL/min — ABNORMAL LOW (ref >90)
GFR calc non Af Amer: 64 mL/min — ABNORMAL LOW (ref >90)
Potassium: 3.5 mEq/L (ref 3.5–5.1)
Sodium: 135 mEq/L (ref 135–145)

## 2012-08-17 LAB — GLUCOSE, CAPILLARY
Glucose-Capillary: 139 mg/dL — ABNORMAL HIGH (ref 70–99)
Glucose-Capillary: 109 mg/dL — ABNORMAL HIGH (ref 70–99)
Glucose-Capillary: 189 mg/dL — ABNORMAL HIGH (ref 70–99)
Glucose-Capillary: 156 mg/dL — ABNORMAL HIGH (ref 70–99)

## 2012-08-17 LAB — TSH: TSH: 0.503 u[IU]/mL (ref 0.350–4.500)

## 2012-08-17 MED ORDER — PERFLUTREN LIPID MICROSPHERE
INTRAVENOUS | Status: AC
  Filled 2012-08-17: qty 10

## 2012-08-17 MED ORDER — CHLORHEXIDINE GLUCONATE CLOTH 2 % EX PADS
6.0000 | MEDICATED_PAD | Freq: Every day | CUTANEOUS | Status: DC
  Administered 2012-08-17 – 2012-08-19 (×3): 6 via TOPICAL

## 2012-08-17 MED ORDER — IPRATROPIUM BROMIDE 0.02 % IN SOLN: 0.5000 mg | RESPIRATORY_TRACT | Status: DC | PRN

## 2012-08-17 MED ORDER — MUPIROCIN 2 % EX OINT
1.0000 "application " | TOPICAL_OINTMENT | Freq: Two times a day (BID) | CUTANEOUS | Status: DC
  Administered 2012-08-17 – 2012-08-19 (×5): 1 via NASAL
  Filled 2012-08-17: qty 22

## 2012-08-17 MED ORDER — OXYCODONE-ACETAMINOPHEN 5-325 MG PO TABS
1.0000 | ORAL_TABLET | ORAL | Status: DC | PRN
  Administered 2012-08-17 – 2012-08-18 (×3): 1 via ORAL
  Filled 2012-08-17 (×3): qty 1

## 2012-08-17 MED ORDER — KETOROLAC TROMETHAMINE 30 MG/ML IJ SOLN
INTRAMUSCULAR | Status: AC
  Administered 2012-08-17: 30 mg
  Filled 2012-08-17: qty 1

## 2012-08-17 MED ORDER — LOSARTAN POTASSIUM 25 MG PO TABS
12.5000 mg | ORAL_TABLET | Freq: Every day | ORAL | Status: DC
  Administered 2012-08-17 – 2012-08-19 (×3): 12.5 mg via ORAL
  Filled 2012-08-17 (×3): qty 0.5

## 2012-08-17 MED ORDER — LEVALBUTEROL HCL 1.25 MG/0.5ML IN NEBU
1.2500 mg | INHALATION_SOLUTION | Freq: Four times a day (QID) | RESPIRATORY_TRACT | Status: DC | PRN
  Administered 2012-08-17 – 2012-08-19 (×3): 1.25 mg via RESPIRATORY_TRACT
  Filled 2012-08-17 (×3): qty 0.5

## 2012-08-17 MED ORDER — KETOROLAC TROMETHAMINE 30 MG/ML IJ SOLN: 30.0000 mg | Freq: Once | INTRAMUSCULAR | Status: AC

## 2012-08-17 MED ORDER — ALBUTEROL SULFATE (5 MG/ML) 0.5% IN NEBU: 2.5000 mg | INHALATION_SOLUTION | RESPIRATORY_TRACT | Status: DC | PRN

## 2012-08-17 NOTE — H&P (Signed)
Internal Medicine Teaching Service Attending Note Date: 08/17/2012  Patient name: Denise Davis  Medical record number: [id number]  Date of birth: [birth year]   I have seen and evaluated Denise [facility] and discussed their care with the Residency Team. Please see Dr Alford Highland H&P for full details. Denise Davis has no h/o asthma and recently had PFT's (4/21)documented in Care Everywhere that were nl except mild restriction and the flow volume loop demonstrated no airway obstruction. She had those performed 2/2 audible wheezing when she went to [facility]. That episode was her second. Her first episode occurred about three weeks ago when it occurred during her husband's MD appt at [facility]. This third episode occurred day of admission when it woke her up during the night and she used her albuterol with a bit of relief and was able to return to sleep. She went ot church and sxs of wheezing, dyspnea, and chest discomfort and hoarseness occurred. She came to the ER and had audible wheezing but it appears lung ascultation was nl. She had gotten steroids and nebs in the ED. IM consulted [name] who conformed dx of vocal cord dysfxn. She is currently only a bit better.   Of note, she has had recent exacerbation of her sciatica on the L. Her PCP has started hydromorphone. She has had to start using a walker and walks very slow. She is having to use her husband's shower chair. She is having trouble with these limitations.   Physical Exam: Blood pressure 141/87, pulse 99, temperature 98.7 F (37.1 C), temperature source Oral, resp. rate 32, height 5\' 5"  (1.651 m), weight 371 lb 4.1 oz (168.4 kg), SpO2 99.00%. General appearance: alert, cooperative, morbidly obese and Hoarse, speaks in whisper but able to speak in full sentences Head: Normocephalic, without obvious abnormality, atraumatic Eyes: conjunctiva clear, EOMI Lungs: Audible wheezing but auscultation is clear although some cut off of airflow with inspirtation Heart:  regular rate and rhythm, S1, S2 normal, no murmur, click, rub or gallop Abdomen: soft, non-tender; bowel sounds normal; no masses,  no organomegaly Extremities: extremities normal, atraumatic, no cyanosis or edema Skin: Skin color, texture, turgor normal. No rashes or lesions Neurologic: Grossly normal  Lab results: Results for orders placed during the hospital encounter of 08/16/12 (from the past 24 hour(s))  URINE RAPID DRUG SCREEN (HOSP PERFORMED)     Status: Abnormal   Collection Time    08/16/12  4:02 PM      Result Value Range   Opiates NONE DETECTED  NONE DETECTED   Cocaine NONE DETECTED  NONE DETECTED   Benzodiazepines POSITIVE (*) NONE DETECTED   Amphetamines NONE DETECTED  NONE DETECTED   Tetrahydrocannabinol NONE DETECTED  NONE DETECTED   Barbiturates NONE DETECTED  NONE DETECTED  MRSA PCR SCREENING     Status: Abnormal   Collection Time    08/16/12  7:05 PM      Result Value Range   MRSA by PCR POSITIVE (*) NEGATIVE  HEPATIC FUNCTION PANEL     Status: Abnormal   Collection Time    08/16/12  7:45 PM      Result Value Range   Total Protein 7.7  6.0 - 8.3 g/dL   Albumin 3.8  3.5 - 5.2 g/dL   AST 16  0 - 37 U/L   ALT 19  0 - 35 U/L   Alkaline Phosphatase 84  39 - 117 U/L   Total Bilirubin 0.3  0.3 - 1.2 mg/dL   Bilirubin, Direct  0.1  0.0 - 0.3 mg/dL   Indirect Bilirubin 0.2 (*) 0.3 - 0.9 mg/dL  TSH     Status: None   Collection Time    08/16/12  7:45 PM      Result Value Range   TSH 0.503  0.350 - 4.500 uIU/mL  GLUCOSE, CAPILLARY     Status: Abnormal   Collection Time    08/16/12  9:05 PM      Result Value Range   Glucose-Capillary 164 (*) 70 - 99 mg/dL  GLUCOSE, CAPILLARY     Status: Abnormal   Collection Time    08/17/12 12:10 AM      Result Value Range   Glucose-Capillary 155 (*) 70 - 99 mg/dL  GLUCOSE, CAPILLARY     Status: Abnormal   Collection Time    08/17/12  3:33 AM      Result Value Range   Glucose-Capillary 139 (*) 70 - 99 mg/dL  BASIC  METABOLIC PANEL     Status: Abnormal   Collection Time    08/17/12  4:28 AM      Result Value Range   Sodium 135  135 - 145 mEq/L   Potassium 3.5  3.5 - 5.1 mEq/L   Chloride 98  96 - 112 mEq/L   CO2 25  19 - 32 mEq/L   Glucose, Bld 150 (*) 70 - 99 mg/dL   BUN 15  6 - 23 mg/dL   Creatinine, Ser 1.61  0.50 - 1.10 mg/dL   Calcium 9.4  8.4 - 09.6 mg/dL   GFR calc non Af Amer 64 (*) >90 mL/min   GFR calc Af Amer 74 (*) >90 mL/min  CBC     Status: Abnormal   Collection Time    08/17/12  4:28 AM      Result Value Range   WBC 6.1  4.0 - 10.5 K/uL   RBC 4.04  3.87 - 5.11 MIL/uL   Hemoglobin 11.5 (*) 12.0 - 15.0 g/dL   HCT 04.5 (*) 40.9 - 81.1 %   MCV 86.6  78.0 - 100.0 fL   MCH 28.5  26.0 - 34.0 pg   MCHC 32.9  30.0 - 36.0 g/dL   RDW 91.4  78.2 - 95.6 %   Platelets 291  150 - 400 K/uL  GLUCOSE, CAPILLARY     Status: Abnormal   Collection Time    08/17/12  8:46 AM      Result Value Range   Glucose-Capillary 109 (*) 70 - 99 mg/dL  GLUCOSE, CAPILLARY     Status: Abnormal   Collection Time    08/17/12 12:36 PM      Result Value Range   Glucose-Capillary 110 (*) 70 - 99 mg/dL    Imaging results:  Dg Neck Soft Tissue  08/16/2012   *RADIOLOGY REPORT*  Clinical Data: Wheezing with voice loss and cough  NECK SOFT TISSUES - 1+ VIEW  Comparison: None.  Findings: Technically challenging due to patient body habitus.  The prevertebral soft tissue contour is within normal limits.  No prevertebral gas is seen.  The hypopharynx is not very distended, and the epiglottis is not discretely seen.  IMPRESSION: Limited exam due to body habitus.  Prevertebral soft tissue contour is within normal limits.   Original Report Authenticated By: Britta Mccreedy, M.D.   Dg Chest Portable 1 View  08/16/2012   *RADIOLOGY REPORT*  Clinical Data: Wheezing.  Short of breath.  PORTABLE CHEST - 1 VIEW  Comparison: 05/09/2011  Findings:  Lungs are under aerated.  Normal heart size.  Grossly clear lungs.  No pneumothorax or  pleural effusion.  IMPRESSION: No active cardiopulmonary disease.   Original Report Authenticated By: Jolaine Click, M.D.    Assessment and Plan: I agree with the formulated Assessment and Plan with the following changes:   1. 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 Speech therapy consult. Will see their rec and look for VCD program in [place] where she prefers to go. Change nebs to alb prn as no COPD on PFT's and pt states they help a little.   Otherwise, per Dr Alford Highland A&P.   Burns Spain, MD 5/19/20143:13 PM

## 2012-08-17 NOTE — Evaluation (Signed)
Speech Language Pathology Evaluation Patient Details Name: Denise Davis MRN: [id number] DOB: Mar 26, 1966 Today's Date: 08/17/2012 Time: 6578-4696 SLP Time Calculation (min): 25 min  Problem List:  Patient Active Problem List   Diagnosis Date Noted  . Wheezing 08/16/2012  . Shortness of breath 08/16/2012  . Acute respiratory distress 08/16/2012  . Stridor 08/16/2012  . Chest pain 05/09/2011  . Diabetes mellitus   . Hypertension    Past Medical History:  Past Medical History  Diagnosis Date  . Diabetes mellitus   . Hypertension   . DVT of leg (deep venous thrombosis) 12/2007    left leg, given warfarin for 6 mos  . Morbid obesity   . Renal cell cancer 2009    S/p nephrectomy (left)  . Pericarditis     Diagnosed/treated at [name]; per patient 4 episodes per cardiologist Dr. [name] persistant chest pain unclear if pericarditis verusus neuropsychogenic   . H/O hiatal hernia   . Anxiety   . Shortness of breath    Past Surgical History:  Past Surgical History  Procedure Laterality Date  . Endometrial ablation  2007  . Tubal ligation    . Insertion of mesh  2014  . Umbilical hernia repair     HPI:  47 y.o no history of asthma, morbid obesity,DM, HTN, renal cell CA, pericarditis presents for upper chest pain/tightness worse breathing in and out. Pain is 8/10 without radiation. She also complains of shortness of breath, wheezing, and hoarseness. This is the patients third episode with these symptoms. Symptoms started around 4 am on the day of admission. She used her Albuterol inhaler 2 puffs x 2 with some relief then went to church. While in church she started wheezing again in the middle of service, experienced chest tightness, increased shortness of breath, and voice change. She denies anxiety though she is on anxiolytics at home.  [name] diagnosed pt. with [name] yesterday.    Assessment / Plan / Recommendation Clinical Impression  Pt. seen after diagnosis of [name] (paradoxical vocal  cord dysfuntion) yesterday by [name] with spouse and son at bedside.  SLP provided pt. with education regarding symptoms, triggers of [name].  SLP intiated treatment such as relaxation of oral structures (relax tongue to floor of mouth, relax lips) and keep shoulders down and relaxed.  Pt. imitated inhalation through nose (sniff) and exhale through pursed lips with min verbal and visual cues.  Pt. given straw to inhale and exhale through to facilitate focus on nose and mouth versus larynx.  Pt.'s vocal quality is breathy with decreased vocal intensity with frequent and short coughing episodes.  Pt. repositioned into supine with hand on diaphragm for biofeedback of movement with inhalations and exhalations.  Encouraged pt. to practice exercises 3 times a day.  Recommend pt. receive outpatient ST for [name] (pt. prefers [name] since she has received treatment there and she lives in [name]).  ST will see while pt. in acute care.    SLP Assessment  Patient needs continued Speech Lanaguage Pathology Services    Follow Up Recommendations  Outpatient SLP    Frequency and Duration min 3x week  2 weeks   Pertinent Vitals/Pain none   SLP Goals  SLP Goals Potential to Achieve Goals: Good Progress/Goals/Alternative treatment plan discussed with pt/caregiver and they: Agree SLP Goal #1: Pt. will perform exercises to facilitate abduction of vocal cords during respiration with min verbal/visual cues.  SLP Evaluation Prior Functioning  Cognitive/Linguistic Baseline: Within functional limits Lives With: Spouse Vocation:  (not  working)   Probation officer Status: Within Functional Limits for tasks assessed    Comprehension  Auditory Comprehension Overall Auditory Comprehension: Appears within functional limits for tasks assessed    Expression Expression Primary Mode of Expression: Verbal Verbal Expression Overall Verbal Expression: Appears within functional limits for tasks assessed    Oral / Motor Oral Motor/Sensory Function Overall Oral Motor/Sensory Function: Appears within functional limits for tasks assessed Motor Speech Respiration: Impaired Level of Impairment: Conversation Phonation: Breathy;Low vocal intensity   GO    [facility]ise [facility] M.Ed CCC-SLP Pager [phone]  08/17/2012  Royce Macadamia 08/17/2012, 3:48 PM

## 2012-08-17 NOTE — Progress Notes (Signed)
Lab called.  Pt positive for MRSA nasal.  Will place on contact and educate pt.  [facility]

## 2012-08-17 NOTE — Progress Notes (Signed)
Md notified. Pt c/o pain in chest like had before.  Will give pain medication now per md order. Will continue to monitor.  Vs stable. Emilie [place]

## 2012-08-17 NOTE — Progress Notes (Addendum)
Subjective: Patient states wheezing is better, still having 7/10 upper chest pain which she reports is new since yesterday.  SOB is improved but still has.  Her hoarseness is improving. She would like to see a SLP in [facility] [place].  Pt denies anxiety issues prior to admission. She takes Dillaudid for sciatica   Objective: Vital signs in last 24 hours: Filed Vitals:   08/17/12 0354 08/17/12 0358 08/17/12 0501 08/17/12 0847  BP:    148/90  Pulse: 94 99    Temp:    98.5 F (36.9 C)  TempSrc:    Oral  Resp: 20 30    Height:      Weight:  371 lb 0.6 oz (168.3 kg) 371 lb 4.1 oz (168.4 kg)   SpO2: 99% 99%  99%   Weight change:   Intake/Output Summary (Last 24 hours) at 08/17/12 1027 Last data filed at 08/16/12 2210  Gross per 24 hour  Intake    123 ml  Output      0 ml  Net    123 ml   Vitals reviewed. General: resting in bed, NAD HEENT: [place]/at,  no scleral icterus Cardiac: RRR, no rubs, murmurs or gallops Pulm: clear to auscultation bilaterally, upper airway wheezing noted  Abd: soft, nontender, nondistended, BS present, scar from umbilical hernia repair around umbilicus  Ext: warm and well perfused, no pedal edema Neuro: alert and oriented X3, Neurologically intact, moving all 4 extremities   Lab Results: Basic Metabolic Panel:  Recent Labs Lab 08/16/12 1205 08/17/12 0428  NA 136 135  K 3.8 3.5  CL 98 98  CO2 28 25  GLUCOSE 152* 150*  BUN 14 15  CREATININE 1.01 1.03  CALCIUM 9.7 9.4   Liver Function Tests:  Recent Labs Lab 08/16/12 1945  AST 16  ALT 19  ALKPHOS 84  BILITOT 0.3  PROT 7.7  ALBUMIN 3.8   CBC:  Recent Labs Lab 08/16/12 1205 08/17/12 0428  WBC 5.7 6.1  NEUTROABS 2.6  --   HGB 11.8* 11.5*  HCT 36.0 35.0*  MCV 86.5 86.6  PLT 318 291   CBG:  Recent Labs Lab 08/16/12 2105 08/17/12 0010 08/17/12 0333 08/17/12 0846  GLUCAP 164* 155* 139* 109*   Thyroid Function Tests:  Recent Labs Lab 08/16/12 1945  TSH 0.503   Urine Drug  Screen: Drugs of Abuse     Component Value Date/Time   LABOPIA NONE DETECTED 08/16/2012 1602   COCAINSCRNUR NONE DETECTED 08/16/2012 1602   LABBENZ POSITIVE* 08/16/2012 1602   AMPHETMU NONE DETECTED 08/16/2012 1602   THCU NONE DETECTED 08/16/2012 1602   LABBARB NONE DETECTED 08/16/2012 1602    Misc. Labs: None   Micro Results: Recent Results (from the past 240 hour(s))  MRSA PCR SCREENING     Status: Abnormal   Collection Time    08/16/12  7:05 PM      Result Value Range Status   MRSA by PCR POSITIVE (*) NEGATIVE Final   Comment:            The GeneXpert MRSA Assay (FDA     approved for NASAL specimens     only), is one component of a     comprehensive MRSA colonization     surveillance program. It is not     intended to diagnose MRSA     infection nor to guide or     monitor treatment for     MRSA infections.     RESULT CALLED  TO, READ BACK BY AND VERIFIED WITH:     [facility] 08/17/12 0657 BY BSLADE   Studies/Results: Dg Neck Soft Tissue  08/16/2012   *RADIOLOGY REPORT*  Clinical Data: Wheezing with voice loss and cough  NECK SOFT TISSUES - 1+ VIEW  Comparison: None.  Findings: Technically challenging due to patient body habitus.  The prevertebral soft tissue contour is within normal limits.  No prevertebral gas is seen.  The hypopharynx is not very distended, and the epiglottis is not discretely seen.  IMPRESSION: Limited exam due to body habitus.  Prevertebral soft tissue contour is within normal limits.   Original Report Authenticated By: Britta Mccreedy, M.D.   Dg Chest Portable 1 View  08/16/2012   *RADIOLOGY REPORT*  Clinical Data: Wheezing.  Short of breath.  PORTABLE CHEST - 1 VIEW  Comparison: 05/09/2011  Findings: Lungs are under aerated.  Normal heart size.  Grossly clear lungs.  No pneumothorax or pleural effusion.  IMPRESSION: No active cardiopulmonary disease.   Original Report Authenticated By: Jolaine Click, M.D.   Medications:  Scheduled Meds: . amitriptyline   25-100 mg Oral QHS  . aspirin EC  81 mg Oral Daily  . carvedilol  12.5 mg Oral BID WC  . Chlorhexidine Gluconate Cloth  6 each Topical Q0600  . enoxaparin (LOVENOX) injection  80 mg Subcutaneous Q24H  . gabapentin  600-2,400 mg Oral QHS  . hydrochlorothiazide  25 mg Oral Daily  . insulin aspart  0-5 Units Subcutaneous QHS  . insulin aspart  0-9 Units Subcutaneous TID WC  . losartan  12.5 mg Oral Daily  . mupirocin ointment  1 application Nasal BID  . sodium chloride  3 mL Intravenous Q12H  . sodium chloride  3 mL Intravenous Q12H   Continuous Infusions:  PRN Meds:.sodium chloride, diazepam, hydrOXYzine, ondansetron (ZOFRAN) IV, ondansetron, sodium chloride  Assessment/Plan: 47 y.o no history of asthma, morbid obesity presents for upper chest pain/tightness, shortness of breath, wheezing, and hoarseness.   1. Paradoxical vocal cord dysfunction -Associated with acute respiratory distress and upper airway wheezing  -She is on Valium 2.5-5 mg q 8 hours prn, Hydroxyzine 50 mg bid prn which we continued  -[name] consulted-Dr. Emeline Darling evaluated rec. SLP, voice therapy, anxiolytics prn, consider CBT. Patient prefers [facility] for specialist treatment  -will d/c lisinopril  -prn Xopenex q6 prn  -pending SLP evaluate with recommendations.  Possibly suggest outpatient f/u in [place]  2. Chest pain  -She has a history of chronic/persistent chest pain. Etiology may be multifactorial seems pleritic, and/or musculoskeletal and/or possibly psychiatric. Patient has a history of recurrent pericarditis and persistent chest pain (per [facility] records). Spoke with Dr. [name] at [facility] who stated the patient has persistent chest pain with previous normal coronary work up in the past unclear etiology so diagnosed with pericarditis but it may be related to anxiety. Echo 05/2011 with EF 55-60%.  -EKG negative for diffuse ST elevations like in pericarditis  -Rx Toradol 30 mg x 3 not helping -will give Percocet  prn   3. History of HTN  -BP 148/90  -Continue HCTZ 25 mg qd, On Coreg 25 mg bid changed to 12.5 mg bid,  continue Asprin 81 mg qd  -will d/c Lisinopril 10 mg qd due to vocal cord dysfunction and add Cozaar 12.5 mg qd .Medication will need to be called into pharmacy [phone] -Monitor VS   4. DM 2 (HA1C 6.2 04/2009)  -Hold Metformin 1000 mg bid, Glipizide 2.5 mg bid  -Monitor cbg,  SSI   5. History of DVT  -Previously treated with Coumadin x 6 months  -Patient is currently asymptomatic without calf pain and saturating well on room air   6. History of RCC status postnephrectomy  -No history of chemo or radiation status post nephrectomy   7. Anxiety  -Continued On Valium 2.5-5 mg q 8 hours prn and hydroxyzine 50 mg bid prn home dose   8. MRSA +nares  -MRSA protocol   9. DVT px  -Lovenox   10. F/E/N  -will monitor and replace electrolytes prn  -NPO advanced to carb mod   Dispo: Disposition is deferred at this time, awaiting improvement of current medical problems. Anticipated discharge in approximately 1 day(s).   The patient does have a current PCP (MILES,LINDA M, MD), therefore will be requiring OPC follow-up after discharge.  Unknown if the patient has transportation limitations that hinder transportation to clinic appointments.    .Services Needed at time of discharge: Y = Yes, Blank = No PT:   OT:   RN:   Equipment:   Other:     LOS: 1 day   Annett Gula 08/17/2012, 10:27 AM

## 2012-08-17 NOTE — Progress Notes (Signed)
Utilization Review Completed.Denise Davis T5/19/2014  

## 2012-08-18 DIAGNOSIS — J383 Other diseases of vocal cords: Secondary | ICD-10-CM | POA: Diagnosis present

## 2012-08-18 LAB — GLUCOSE, CAPILLARY
Glucose-Capillary: 187 mg/dL — ABNORMAL HIGH (ref 70–99)
Glucose-Capillary: 132 mg/dL — ABNORMAL HIGH (ref 70–99)
Glucose-Capillary: 185 mg/dL — ABNORMAL HIGH (ref 70–99)

## 2012-08-18 MED ORDER — LORAZEPAM 1 MG PO TABS
1.0000 mg | ORAL_TABLET | Freq: Four times a day (QID) | ORAL | Status: DC | PRN
  Administered 2012-08-19: 1 mg via ORAL
  Filled 2012-08-18: qty 1

## 2012-08-18 MED ORDER — LORATADINE 10 MG PO TABS
10.0000 mg | ORAL_TABLET | Freq: Every day | ORAL | Status: DC
  Administered 2012-08-18 – 2012-08-19 (×2): 10 mg via ORAL
  Filled 2012-08-18 (×2): qty 1

## 2012-08-18 MED ORDER — HYDROMORPHONE HCL 2 MG PO TABS: 2.0000 mg | ORAL_TABLET | Freq: Three times a day (TID) | ORAL | Status: DC | PRN

## 2012-08-18 MED ORDER — PANTOPRAZOLE SODIUM 40 MG PO TBEC
40.0000 mg | DELAYED_RELEASE_TABLET | Freq: Every day | ORAL | Status: DC
  Administered 2012-08-18 – 2012-08-19 (×2): 40 mg via ORAL
  Filled 2012-08-18 (×2): qty 1

## 2012-08-18 MED ORDER — CLORAZEPATE DIPOTASSIUM 7.5 MG PO TABS
7.5000 mg | ORAL_TABLET | Freq: Two times a day (BID) | ORAL | Status: DC
  Administered 2012-08-18 – 2012-08-19 (×2): 7.5 mg via ORAL
  Filled 2012-08-18 (×2): qty 4

## 2012-08-18 MED ORDER — BENZONATATE 100 MG PO CAPS
100.0000 mg | ORAL_CAPSULE | Freq: Two times a day (BID) | ORAL | Status: DC
  Administered 2012-08-18 – 2012-08-19 (×2): 100 mg via ORAL
  Filled 2012-08-18 (×3): qty 1

## 2012-08-18 MED ORDER — HYDROMORPHONE HCL 2 MG PO TABS: 2.0000 mg | ORAL_TABLET | ORAL | Status: DC | PRN

## 2012-08-18 MED ORDER — OXYCODONE-ACETAMINOPHEN 5-325 MG PO TABS
1.0000 | ORAL_TABLET | ORAL | Status: DC | PRN
  Administered 2012-08-18: 1 via ORAL
  Administered 2012-08-18: 2 via ORAL
  Administered 2012-08-19: 1 via ORAL
  Filled 2012-08-18 (×2): qty 1
  Filled 2012-08-18: qty 2

## 2012-08-18 MED ORDER — BENZONATATE 100 MG PO CAPS
100.0000 mg | ORAL_CAPSULE | Freq: Two times a day (BID) | ORAL | Status: DC | PRN
  Administered 2012-08-18: 100 mg via ORAL
  Filled 2012-08-18: qty 1

## 2012-08-18 NOTE — Progress Notes (Addendum)
Subjective: Pt c/o wheezing, upper chest pain 8/10 not relieved much by Percocet and aggravated by deep breathing and coughing.  This is not where her normal chest pain is it is normally lower.  She had a bowel movement last night.  Xopenex tx are helping a little bit for wheezing. She c/o cough which started Sunday. Patient c/o h/a later on the afternoon.    Objective: Vital signs in last 24 hours: Filed Vitals:   08/17/12 2031 08/18/12 0445 08/18/12 1003 08/18/12 1314  BP:  127/77 138/79 127/83  Pulse: 98 90 95 100  Temp:  97.8 F (36.6 C)  98.6 F (37 C)  TempSrc:  Oral  Oral  Resp: 20 20  18   Height:      Weight:  370 lb 13 oz (168.2 kg)    SpO2: 99% 98%  96%   Weight change: 1 lb 5.2 oz (0.6 kg)  Intake/Output Summary (Last 24 hours) at 08/18/12 1426 Last data filed at 08/18/12 1300  Gross per 24 hour  Intake    963 ml  Output    300 ml  Net    663 ml   Vitals reviewed. General: resting in bed, NAD HEENT: [place]/at,  no scleral icterus Cardiac: RRR, no rubs, murmurs or gallops Pulm: clear to auscultation bilaterally, upper airway wheezing noted  Abd: soft, nontender, nondistended, BS present, scar from umbilical hernia repair around umbilicus  Ext: warm and well perfused, no pedal edema Neuro: alert and oriented X3, Neurologically intact, moving all 4 extremities   Lab Results: Basic Metabolic Panel:  Recent Labs Lab 08/16/12 1205 08/17/12 0428  NA 136 135  K 3.8 3.5  CL 98 98  CO2 28 25  GLUCOSE 152* 150*  BUN 14 15  CREATININE 1.01 1.03  CALCIUM 9.7 9.4   Liver Function Tests:  Recent Labs Lab 08/16/12 1945  AST 16  ALT 19  ALKPHOS 84  BILITOT 0.3  PROT 7.7  ALBUMIN 3.8   CBC:  Recent Labs Lab 08/16/12 1205 08/17/12 0428  WBC 5.7 6.1  NEUTROABS 2.6  --   HGB 11.8* 11.5*  HCT 36.0 35.0*  MCV 86.5 86.6  PLT 318 291   CBG:  Recent Labs Lab 08/17/12 1622 08/17/12 1939 08/17/12 2355 08/18/12 0440 08/18/12 0744 08/18/12 1204  GLUCAP  189* 156* 142* 123* 187* 132*   Thyroid Function Tests:  Recent Labs Lab 08/16/12 1945  TSH 0.503   Urine Drug Screen: Drugs of Abuse     Component Value Date/Time   LABOPIA NONE DETECTED 08/16/2012 1602   COCAINSCRNUR NONE DETECTED 08/16/2012 1602   LABBENZ POSITIVE* 08/16/2012 1602   AMPHETMU NONE DETECTED 08/16/2012 1602   THCU NONE DETECTED 08/16/2012 1602   LABBARB NONE DETECTED 08/16/2012 1602    Misc. Labs: None   Micro Results: Recent Results (from the past 240 hour(s))  MRSA PCR SCREENING     Status: Abnormal   Collection Time    08/16/12  7:05 PM      Result Value Range Status   MRSA by PCR POSITIVE (*) NEGATIVE Final   Comment:            The GeneXpert MRSA Assay (FDA     approved for NASAL specimens     only), is one component of a     comprehensive MRSA colonization     surveillance program. It is not     intended to diagnose MRSA     infection nor to guide or  monitor treatment for     MRSA infections.     RESULT CALLED TO, READ BACK BY AND VERIFIED WITH:     [facility] 08/17/12 0657 BY BSLADE   Studies/Results: No results found. Medications:  Scheduled Meds: . amitriptyline  25-100 mg Oral QHS  . aspirin EC  81 mg Oral Daily  . benzonatate  100 mg Oral BID  . carvedilol  12.5 mg Oral BID WC  . Chlorhexidine Gluconate Cloth  6 each Topical Q0600  . clorazepate  7.5 mg Oral BID  . enoxaparin (LOVENOX) injection  80 mg Subcutaneous Q24H  . gabapentin  600-2,400 mg Oral QHS  . hydrochlorothiazide  25 mg Oral Daily  . insulin aspart  0-5 Units Subcutaneous QHS  . insulin aspart  0-9 Units Subcutaneous TID WC  . loratadine  10 mg Oral Daily  . losartan  12.5 mg Oral Daily  . mupirocin ointment  1 application Nasal BID  . pantoprazole  40 mg Oral Daily  . sodium chloride  3 mL Intravenous Q12H  . sodium chloride  3 mL Intravenous Q12H   Continuous Infusions:  PRN Meds:.sodium chloride, levalbuterol, LORazepam, ondansetron (ZOFRAN) IV,  ondansetron, oxyCODONE-acetaminophen, sodium chloride  Assessment/Plan: 47 y.o no history of asthma, morbid obesity presents for upper chest pain/tightness, shortness of breath, wheezing, and hoarseness.   1. Paradoxical vocal cord dysfunction -Associated with acute respiratory distress and upper airway wheezing  -d/c Valium 2.5-5 mg q 8 hours prn, Hydroxyzine 50 mg bid prn and changed to Rx Tranxene 7.5 mg bid and Ativan 1 mg po q 6 prn for anxiety  -[name] consulted-Dr. Emeline Darling evaluated rec. SLP, voice therapy, anxiolytics prn, consider CBT. Patient prefers [facility] for specialist treatment  -prn Xopenex q6 prn  -SLP recommend outpatient f/u in [place].  We want speech to evaluate the patient again 08/18/12 for instructions  2. Chest pain  -Unclear etiology.  She has a history of chronic/persistent chest pain. Etiology may be multifactorial seems pleritic, and/or musculoskeletal and/or possibly psychiatric. Patient has a history of recurrent pericarditis and persistent chest pain (per [facility] records). She can have throat tightness with vocal cord dysfunction -Percocet prn   3. History of HTN  -BP 127/77 -Continue HCTZ 25 mg qd, On Coreg 12.5 mg bid,  continue Asprin 81 mg qd, Cozaar 12.5 mg qd .Medication will need to be called into pharmacy [phone] -Monitor VS   4. DM 2 (HA1C 6.2 04/2009)  -Hold Metformin 1000 mg bid, Glipizide 2.5 mg bid  -Monitor cbg, SSI   5. History of DVT  -Previously treated with Coumadin x 6 months  -Patient is currently asymptomatic without calf pain and saturating well on room air   6. History of RCC status postnephrectomy  -No history of chemo or radiation status post nephrectomy   7. Anxiety  -d/c Valium 2.5-5 mg q 8 hours prn and hydroxyzine 50 mg bid prn home dose.  Rx Tranxene 7.5 mg bid and Ativan 1 mg po q 6 prn for anxiety   8. MRSA +nares  -MRSA protocol   9. DVT px  -Lovenox   10. F/E/N  -will monitor and replace electrolytes prn   -carb mod   Dispo: D/c possibly 5/21 or 5/22. Dr. Ranae Pila f/u 08/25/12 2:40 PM Fax # [phone]  The patient does have a current PCP (MILES,LINDA M, MD), therefore will be requiring [facility]PC follow-up after discharge.  Unknown if the patient has transportation limitations that hinder transportation to clinic appointments.    Marland Kitchen  Services Needed at time of discharge: Y = Yes, Blank = No PT:   OT:   RN:   Equipment:   Other:     LOS: 2 days   Annett Gula [phone] 08/18/2012, 2:26 PM

## 2012-08-18 NOTE — Progress Notes (Signed)
Speech Language Pathology Treatment Patient Details Name: Denise Davis MRN: [id number] DOB: 02-20-1966 Today's Date: 08/18/2012 Time: 9629-5284 SLP Time Calculation (min): 17 min  Assessment / Plan / Recommendation Clinical Impression  Treatment focused on facilitation of vocal cord function.  Pt. is aphonic when attempting to verbalize and has difficulty adducting her vocal cords.  Two brief instances of vocal cord adduction were audible.  Pt. cued to push on SLP's hand during phonation for resistance to facilitate approximation of vocal cords which was ineffective.  SLP reviewed [name]'s procedure (after this session) note which revealed pt.'s vocal cords abducted when asked to phonate, however when requested to hum she was able to adduct vocal cords.  SLP also reviewed diaphragmatic breathing exericses.  ST will continue to see.  Provided pt. with contact information for Speech Pathology at [facility] health outpatient if pt. were to decide to receive treatment in [place].       SLP Plan  Continue with current plan of care    Pertinent Vitals/Pain none  SLP Goals  SLP Goals Potential to Achieve Goals: Good Progress/Goals/Alternative treatment plan discussed with pt/caregiver and they: Agree SLP Goal #1: Pt. will perform exercises to facilitate abduction of vocal cords during respiration with min verbal/visual cues. SLP Goal #1 - Progress: Progressing toward goal  General Temperature Spikes Noted: No Respiratory Status: Room air Behavior/Cognition: Alert;Cooperative;Pleasant mood Oral Cavity - Dentition: Adequate natural dentition Patient Positioning: Upright in bed  Oral Cavity - Oral Hygiene     Treatment Treatment focused on: Voice;Patient/family/caregiver education (treatment for [name]) Skilled Treatment:  (min verbal and visual cues)   GO    Royce Macadamia M.Ed CCC-SLP Pager [phone]  08/18/2012  Royce Macadamia 08/18/2012, 3:13 PM

## 2012-08-18 NOTE — Progress Notes (Signed)
Pt states discomfort at IV site. Attempt re-stick x's 2, unsuccessful. IV paged, aware.

## 2012-08-18 NOTE — Evaluation (Addendum)
Physical Therapy Evaluation/Discharge Patient Details Name: Denise Davis MRN: [id number] DOB: Nov 23, 1965 Today's Date: 08/18/2012 Time: 4098-1191 PT Time Calculation (min): 16 min  PT Assessment / Plan / Recommendation Clinical Impression  Pt adm with paradoxical vocal cord dysfunction.  Pt demonstrated good mobility.  Just needed several standing rest breaks to control breathing.  No further PT needed.    PT Assessment  Patent does not need any further PT services    Follow Up Recommendations  No PT follow up    Does the patient have the potential to tolerate intense rehabilitation      Barriers to Discharge        Equipment Recommendations  None recommended by PT    Recommendations for Other Services     Frequency      Precautions / Restrictions Precautions Precautions: None   Pertinent Vitals/Pain Several standing rest breaks to control breathing.      Mobility  Bed Mobility Bed Mobility: Supine to Sit;Sitting - Scoot to Edge of Bed;Sit to Supine Supine to Sit: 6: Modified independent (Device/Increase time);HOB elevated Sitting - Scoot to Edge of Bed: 6: Modified independent (Device/Increase time) Sit to Supine: 6: Modified independent (Device/Increase time);HOB elevated Transfers Transfers: Sit to Stand;Stand to Sit Sit to Stand: 6: Modified independent (Device/Increase time);With upper extremity assist;From bed Stand to Sit: 6: Modified independent (Device/Increase time);With upper extremity assist;To bed Ambulation/Gait Ambulation/Gait Assistance: 6: Modified independent (Device/Increase time) Ambulation Distance (Feet): 175 Feet Assistive device: Rolling walker Ambulation/Gait Assistance Details: Pt took several standing rest breaks. Gait Pattern: Step-through pattern;Decreased stride length Gait velocity: decr    Exercises     PT Diagnosis:    PT Problem List:   PT Treatment Interventions:     PT Goals    Visit Information  Last PT Received  On: 08/25/12 Assistance Needed: +1    Subjective Data  Subjective: Pt agreeable to amb. Patient Stated Goal: Go home   Prior Functioning  Home Living Lives With: Spouse Available Help at Discharge: Family Home Adaptive Equipment: Walker - rolling Prior Function Level of Independence: Independent with assistive device(s) Comments: Uses walker occasionally at home if sciatica is bothering her Communication Communication: Other (comment) (See speech note)    Cognition  Cognition Arousal/Alertness: Awake/alert Behavior During Therapy: WFL for tasks assessed/performed Overall Cognitive Status: Within Functional Limits for tasks assessed    Extremity/Trunk Assessment Right Lower Extremity Assessment RLE ROM/Strength/Tone: [facility] for tasks assessed Left Lower Extremity Assessment LLE ROM/Strength/Tone: [facility] for tasks assessed   Balance Balance Balance Assessed: Yes Static Standing Balance Static Standing - Balance Support: No upper extremity supported Static Standing - Level of Assistance: 6: Modified independent (Device/Increase time)  End of Session PT - End of Session Activity Tolerance: Patient tolerated treatment well Patient left: in bed;with call bell/phone within reach Nurse Communication: Mobility status  GP Functional Assessment Tool Used: clinical judgement Functional Limitation: Mobility: Walking and moving around Mobility: Walking and Moving Around Current Status ([id number]): At least 1 percent but less than 20 percent impaired, limited or restricted Mobility: Walking and Moving Around Goal Status [phone]): At least 1 percent but less than 20 percent impaired, limited or restricted Mobility: Walking and Moving Around Discharge Status [phone]): At least 1 percent but less than 20 percent impaired, limited or restricted   [facility] 08/18/2012, 3:48 PM  [facility] PT [phone]

## 2012-08-19 DIAGNOSIS — F411 Generalized anxiety disorder: Secondary | ICD-10-CM

## 2012-08-19 LAB — GLUCOSE, CAPILLARY
Glucose-Capillary: 170 mg/dL — ABNORMAL HIGH (ref 70–99)
Glucose-Capillary: 155 mg/dL — ABNORMAL HIGH (ref 70–99)

## 2012-08-19 MED ORDER — MUPIROCIN 2 % EX OINT: 1.0000 "application " | TOPICAL_OINTMENT | Freq: Two times a day (BID) | CUTANEOUS | Status: DC

## 2012-08-19 MED ORDER — LORAZEPAM 1 MG PO TABS: 1.0000 mg | ORAL_TABLET | Freq: Four times a day (QID) | ORAL | Status: DC | PRN

## 2012-08-19 MED ORDER — CLORAZEPATE DIPOTASSIUM 7.5 MG PO TABS: 7.5000 mg | ORAL_TABLET | Freq: Two times a day (BID) | ORAL | Status: DC

## 2012-08-19 MED ORDER — BENZONATATE 100 MG PO CAPS: 100.0000 mg | ORAL_CAPSULE | Freq: Two times a day (BID) | ORAL | Status: DC

## 2012-08-19 MED ORDER — PANTOPRAZOLE SODIUM 40 MG PO TBEC: 40.0000 mg | DELAYED_RELEASE_TABLET | Freq: Every day | ORAL | Status: DC

## 2012-08-19 MED ORDER — LOSARTAN POTASSIUM 25 MG PO TABS: 12.5000 mg | ORAL_TABLET | Freq: Every day | ORAL | Status: DC

## 2012-08-19 MED ORDER — LORATADINE 10 MG PO TABS: 10.0000 mg | ORAL_TABLET | Freq: Every day | ORAL | Status: DC

## 2012-08-19 MED FILL — Perflutren Lipid Microsphere IV Susp 1.1 MG/ML: INTRAVENOUS | Qty: 10 | Status: AC

## 2012-08-19 NOTE — Progress Notes (Signed)
Speech Language Pathology Treatment Patient Details Name: Denise Davis MRN: [id number] DOB: 08-08-1965 Today's Date: 08/19/2012 Time: 4098-1191 SLP Time Calculation (min): 17 min  Assessment / Plan / Recommendation Clinical Impression  Pt. seen this am for PCVD treatment.  Upon arrival pt. experiecing almost continuous coughing episodes.  SLP cued her to take deep inhalations and exhale with pursed lips, drink water.  She reported drinking 2 cups of coffee due to increased temp which was ineffecitve.  Coughing subsided somewhat and pt. was asked to hum to facilitate vocal cord adduction.  She was unable to achieve phonation with humming.  Pt.'s coughing subsided and was able to engage in more uninterrupted dialogue.  As pt. spoke, SLP detectd slight intermittent vocal cord adduction and she stated she was aware of this.  She said her spouse noted intermittent breaks of phonation as she continued to speak.  SLP will follow while here.      SLP Plan  Continue with current plan of care    Pertinent Vitals/Pain none  SLP Goals  SLP Goals Potential to Achieve Goals: Good SLP Goal #1: Pt. will perform exercises to facilitate abduction of vocal cords during respiration with min verbal/visual cues. SLP Goal #1 - Progress: Progressing toward goal  General Temperature Spikes Noted: No Respiratory Status: Room air Behavior/Cognition: Alert;Cooperative;Pleasant mood Oral Cavity - Dentition: Adequate natural dentition Patient Positioning: Upright in bed  Oral Cavity - Oral Hygiene Brush patient's teeth BID with toothbrush (using toothpaste with fluoride): Yes   Treatment Treatment focused on: Voice;Patient/family/caregiver education Skilled Treatment: see impression statement   GO    Darrow Bussing.Ed [employer] [phone]  08/19/2012

## 2012-08-19 NOTE — Discharge Summary (Signed)
Internal Medicine [facility] Discharge Note  Name: Denise Davis MRN: [id number] DOB: 02-24-1966 47 y.o.  Date of Admission: 08/16/2012 11:36 AM Date of Discharge: 08/19/2012 Attending Physician: Burns Spain, MD  Discharge Diagnosis: 1. Paradoxical vocal cord dysfunction without anatomical airway pathology.  2. Chest pain  3. History of hypertension 4. Diabetes mellitus 2 (HA1C 6.2 04/2009)  5. History of DVT  6. History of renal cell carcinomal status postnephrectomy  7. Anxiety  8. MRSA +nares   Discharge Medications:   Medication List    STOP taking these medications       diazepam 5 MG tablet  Commonly known as:  VALIUM     HYDROmorphone 2 MG tablet  Commonly known as:  DILAUDID     hydrOXYzine 50 MG tablet  Commonly known as:  ATARAX/VISTARIL     hyoscyamine 0.125 MG Tbdp  Commonly known as:  ANASPAZ     lisinopril 10 MG tablet  Commonly known as:  PRINIVIL,ZESTRIL      TAKE these medications       amitriptyline 25 MG tablet  Commonly known as:  ELAVIL  Take 25-100 mg by mouth at bedtime.     aspirin EC 81 MG tablet  Take 81 mg by mouth daily.     benzonatate 100 MG capsule  Commonly known as:  TESSALON  Take 1 capsule (100 mg total) by mouth 2 (two) times daily.     carvedilol 12.5 MG tablet  Commonly known as:  COREG  Take 12.5 mg by mouth 2 (two) times daily with a meal.     clorazepate 7.5 MG tablet  Commonly known as:  TRANXENE  Take 1 tablet (7.5 mg total) by mouth 2 (two) times daily.     gabapentin 600 MG tablet  Commonly known as:  NEURONTIN  Take 600-2,400 mg by mouth at bedtime. Start with 600 mg and may take up to 2400 mg     glipiZIDE 5 MG tablet  Commonly known as:  GLUCOTROL  Take 2.5 mg by mouth 2 (two) times daily before a meal.     hydrochlorothiazide 25 MG tablet  Commonly known as:  HYDRODIURIL  Take 25 mg by mouth daily.     loratadine 10 MG tablet  Commonly known as:  CLARITIN  Take 1 tablet  (10 mg total) by mouth daily.     LORazepam 1 MG tablet  Commonly known as:  ATIVAN  Take 1 tablet (1 mg total) by mouth every 6 (six) hours as needed for anxiety.     losartan 25 MG tablet  Commonly known as:  COZAAR  Take 0.5 tablets (12.5 mg total) by mouth daily.     metFORMIN 1000 MG tablet  Commonly known as:  GLUCOPHAGE  Take 1,000 mg by mouth 2 (two) times daily with a meal.     mupirocin ointment 2 %  Commonly known as:  BACTROBAN  Apply 1 application topically 2 (two) times daily.     pantoprazole 40 MG tablet  Commonly known as:  PROTONIX  Take 1 tablet (40 mg total) by mouth daily.     promethazine 50 MG tablet  Commonly known as:  PHENERGAN  Take 50 mg by mouth every 6 (six) hours as needed. For nausea        Disposition and follow-up:   [name] was discharged from [facility] in stable condition.  At the hospital follow up visit please address  1. Vocal  cord dysfunction management  2. Pain, Anxiety control 3. Assess chest pain.  Previous work up with Dr. [name] negative 4. Check HA1C if not been checked  5. Dyspnea at night and patient snores. Consider referral for outpatient sleep study patient may have Obstructive sleep apnea or Obesity Hypoventilation Syndrome.  Patient was ambulated with pulse oximetry and did well.  6. Chronic leg pain per patient history due to sciatica. Patient was evaluated by PT with no outpatient needs and did well except for needing to take breaks for dyspnea  Follow-up Appointments:  Discharge Orders   Future Orders Complete By Expires     Discharge instructions  As directed     Comments:      1. Follow up with Speech Language Pathology 08/21/12 at 2 PM [phone]. [address] [place] [address]  2. Follow up with Dr. Marvis Moeller 08/25/12 at 2:40 PM-She can give a longer duration of your refills.  I will not call it into the pharmacy it will be her responsibility since she is your  primary doctor   3. Review all information and read. Make sure to go to follow up appointments    Increase activity slowly  As directed        Consultations:  Speech Language Pathology Physical therapy  [name]-Dr. Emeline Darling   Procedures Performed:  Dg Neck Soft Tissue  08/16/2012   *RADIOLOGY REPORT*  Clinical Data: Wheezing with voice loss and cough  NECK SOFT TISSUES - 1+ VIEW  Comparison: None.  Findings: Technically challenging due to patient body habitus.  The prevertebral soft tissue contour is within normal limits.  No prevertebral gas is seen.  The hypopharynx is not very distended, and the epiglottis is not discretely seen.  IMPRESSION: Limited exam due to body habitus.  Prevertebral soft tissue contour is within normal limits.   Original Report Authenticated By: Britta Mccreedy, M.D.   Dg Chest Portable 1 View  08/16/2012   *RADIOLOGY REPORT*  Clinical Data: Wheezing.  Short of breath.  PORTABLE CHEST - 1 VIEW  Comparison: 05/09/2011  Findings: Lungs are under aerated.  Normal heart size.  Grossly clear lungs.  No pneumothorax or pleural effusion.  IMPRESSION: No active cardiopulmonary disease.   Original Report Authenticated By: Jolaine Click, M.D.    2D Echo: none   Cardiac Cath: none   Laryngoscopy on 08/16/12  Procedure Note: 31575 Informed verbal consent was obtained after explaining the risks (including bleeding and infection), benefits and alternatives of the procedure. Verbal timeout was performed prior to the procedure. The nose was topicalized with topical surgilube. The 4mm flexible scope was advanced through the right nasal cavity. The septum and turbinates appeared normal. The middle meatus was free of polyps of purulence. The eustachian tube, choana, and adenoids were normal in appearance. The hypopharynx, arytenoids, false vocal folds, and true vocal folds appeared normal with no masses or lesions and a widely patent glottic airway and widely patent subglottic airway. When asked  to phonate, the patient paradoxically abducts her vocal folds, when asked to inspire she correctly abducts her vocal folds, and when asked to hum she correctly adducts the vocal folds. The visualized portion of the subglottis appeared normal. The patient tolerated the procedure with no immediate complications   Admission HPI:  Chief Complaint: chest pain/tightness with breathing, shortness of breath, wheezing    History of Present Illness (per husband and nonverbal patient):  48 y.o no history of asthma, morbid obesity presents for upper chest  pain/tightness worse breathing in and out. Pain is 8/10 without radiation. She also complains of shortness of breath, wheezing, and hoarseness. This is the patients third episode with these symptoms. Symptoms started around 4 am on the day of admission. She used her Albuterol inhaler 2 puffs x 2 with some relief then went to church. While in church she started wheezing again in the middle of service, experienced chest tightness, increased shortness of breath, and voice change. So far breathing treatment did with Duobeb, Racepinephrine nebulizer has not helped in the ED and she feels symptoms are worsening. She denies anxiety though she is on anxiolytics at home. She denies sick contacts.   Review of Systems:  General: +sweating, +feels hot, denies fever/chills  HEENT: denies dysphagia, denies sore throat, +hoarseness  Cardiac: +upper chest pain (worse with breathing in and out, 8/10), +chest tightness  Pulm: +wheezing, +sob, breathing treatments did not help symptoms this time  Abd/GU: denies abdominal pain, denies dysuria, denies blood in urine or stool  Ext: denies leg swelling or calf pain  Neuro: not assessed  Psych: denies anxiety  Other: denies sick contacts   Physical Exam:  VS HR 112-115, 97-100% RA, 20s-40s, BP 138/91 (103)  Blood pressure 142/90, pulse 110, temperature 99.1 F (37.3 C), temperature source Oral, resp. rate 22, SpO2 100.00%.   Vitals reviewed.  General: resting in bed, appears anxious  HEENT: PERRL b/l, [place]/at, no scleral icterus  Cardiac: tachycardic, no rubs, murmurs or gallops, +reproducbile upper chest pain with palpation  Pulm: clear to auscultation bilaterally, no wheezes, rales, or rhonchi, +expiratory wheezing noted audibly without scope and with ausculation of neck  Abd: soft, nontender, nondistended, BS present, obese, surgical scar from umbilical hernia repair  Ext: warm and well perfused, no pedal edema  Neuro: alert and oriented X3, CN grossly neurologically intact moving all 4 extremities      Hospital Course by problem list: 1. Paradoxical vocal cord dysfunction without anatomical airway pathology.  2. Chest pain  3. History of hypertension 4. Diabetes mellitus 2 (HA1C 6.2 04/2009)  5. History of DVT  6. History of renal cell carcinomal status postnephrectomy  7. Anxiety  8. MRSA +nares   47 y.o no history of asthma, morbid obesity presents for upper chest pain/tightness, shortness of breath, wheezing, and hoarseness.   1. Paradoxical vocal cord dysfunction without anatomical airway pathology.  Associated with acute respiratory distress, upper airway wheezing, chest pain/tightness, hoarseness.  We discontinued her Valium 2.5-5 mg q 8 hours as needed and Hydroxyzine 50 mg bid as needed and changed to Tranxene 7.5 mg bid and Ativan 1 mg po q 6 as needed for anxiety.  [name] was consulted (Dr. Emeline Darling) this admission and did a laryngoscopy with recommendations to have the patient evaluated by Speech Language Pathology (SLP), voice therapy, anxiolytics as needed, consider cognitive behavioral therapy. Patient prefers [facility] for specialist treatment.  SLP evaluated her this admission and educated her on the techniques to control and help prevent.  Also she was counseled on methods to decreased anxiety, breathing techniques and meditation.  We tried to help her breathing with Xopenex nebulizer every 6  hours as needed.  We arranged with Alcide Goodness (SLP) [place] [phone] [address] [place] [address].  She will follow up with her PCP Dr. Marvis Moeller 08/25/12 at 2:40 PM.     2. Chest pain  Unclear etiology. She has a history of chronic/persistent chest pain. Etiology may be multifactorial seems  pleritic, and/or musculoskeletal and/or possibly psychiatric. Patient has a history of recurrent pericarditis and persistent chest pain (per [facility] records cardiologist Dr. [name]). He has done a cardiac work up on her previously and it was negative.  His notes suggests chest pain may be related to anxiety.  She also can have throat tightness with vocal cord dysfunction.  She was given Percocet 5-325 mg 1-2 tablets every 4 hours this admission.  She can not take NSAIDS due to only 1 kidney status post nephrectomy.    3. History of hypertension On day of discharge blood pressure 127/77.  We continued HCTZ 25 mg daily, Coreg 12.5 mg bid, continue Asprin 81 mg daily. We discontinued Lisinopril 10 mg daily due to diagnosis of vocal cord dysfunction and started on Cozaar 12.5 mg daily.  Medications will need to be called into pharmacy [phone] by patient's primary care provider.    4. Diabetes mellitus 2 (HA1C 6.2 04/2009)  Held Metformin 1000 mg bid, Glipizide 2.5 mg bid will resume at discharge.  We monitored her glucose levels and gave her sliding scale insulin this admission.    5. History of DVT  Previously treated with Coumadin x 6 months (2009).  Patient is currently asymptomatic without calf pain and saturating well on room air.    6. History of renal cell carcinomal status postnephrectomy  -No history of chemotherapy or radiation status post nephrectomy.   7. Anxiety  Discontinued Valium 2.5-5 mg q 8 hours prn and hydroxyzine 50 mg bid prn home dose and started Tranxene 7.5 mg bid and Ativan 1 mg po q 6 as needed for anxiety.  Her primary doctor will need to refill at  discharge.    8. MRSA +nares  Placed on MRSA protocol. To continue for 5 days.   For DVT prophylaxis Lovenox.   Follow up with Dr. Marvis Moeller f/u 08/25/12 2:40 PM Fax # [phone]   Discharge Vitals:  BP 127/80  Pulse 93  Temp(Src) 98.1 F (36.7 C) (Oral)  Resp 18  Ht 5\' 5"  (1.651 m)  Wt 370 lb 13 oz (168.2 kg)  BMI 61.71 kg/m2  SpO2 100%  Discharge physical exam:  General: resting in bed, NAD  HEENT: [place]/at, no scleral icterus  Cardiac: RRR, no rubs, murmurs or gallops  Pulm: clear to auscultation bilaterally, upper airway wheezing noted  Abd: soft, nontender, nondistended, BS present, scar from umbilical hernia repair around umbilicus  Ext: warm and well perfused, no pedal edema  Neuro: alert and oriented X3, Neurologically intact, moving all 4 extremities   Discharge Labs:  Results for Denise Davis, Denise Davis (MRN [id number]) as of 08/19/2012 08:41  Ref. Range 08/16/2012 12:05 08/16/2012 19:45 08/17/2012 04:28  Sodium Latest Range: 135-145 mEq/L 136  135  Potassium Latest Range: 3.5-5.1 mEq/L 3.8  3.5  Chloride Latest Range: 96-112 mEq/L 98  98  CO2 Latest Range: 19-32 mEq/L 28  25  BUN Latest Range: 6-23 mg/dL 14  15  Creatinine Latest Range: 0.50-1.10 mg/dL 9.52  8.41  Calcium Latest Range: 8.4-10.5 mg/dL 9.7  9.4  GFR calc non Af Amer Latest Range: >90 mL/min 66 (L)  64 (L)  GFR calc Af Amer Latest Range: >90 mL/min 76 (L)  74 (L)  Glucose Latest Range: 70-99 mg/dL 324 (H)  401 (H)  Alkaline Phosphatase Latest Range: 39-117 U/L  84   Albumin Latest Range: 3.5-5.2 g/dL  3.8   AST Latest Range: 0-37 U/L  16   ALT Latest Range: 0-35 U/L  19   Total Protein Latest Range: 6.0-8.3 g/dL  7.7   Bilirubin, Direct Latest Range: 0.0-0.3 mg/dL  0.1   Indirect Bilirubin Latest Range: 0.3-0.9 mg/dL  0.2 (L)   Total Bilirubin Latest Range: 0.3-1.2 mg/dL  0.3    Results for Denise Davis, Denise Davis (MRN [id number]) as of 08/19/2012 08:41  Ref. Range 08/16/2012 12:05 08/17/2012 04:28  WBC Latest  Range: 4.0-10.5 K/uL 5.7 6.1  RBC Latest Range: 3.87-5.11 MIL/uL 4.16 4.04  Hemoglobin Latest Range: 12.0-15.0 g/dL 40.9 (L) 81.1 (L)  HCT Latest Range: 36.0-46.0 % 36.0 35.0 (L)  MCV Latest Range: 78.0-100.0 fL 86.5 86.6  MCH Latest Range: 26.0-34.0 pg 28.4 28.5  MCHC Latest Range: 30.0-36.0 g/dL 91.4 78.2  RDW Latest Range: 11.5-15.5 % 15.3 15.4  Platelets Latest Range: 150-400 K/uL 318 291  Neutrophils Relative % Latest Range: 43-77 % 45   Lymphocytes Relative Latest Range: 12-46 % 44   Monocytes Relative Latest Range: 3-12 % 9   Eosinophils Relative Latest Range: 0-5 % 2   Basophils Relative Latest Range: 0-1 % 1   NEUT# Latest Range: 1.7-7.7 K/uL 2.6   Lymphocytes Absolute Latest Range: 0.7-4.0 K/uL 2.5   Monocytes Absolute Latest Range: 0.1-1.0 K/uL 0.5   Eosinophils Absolute Latest Range: 0.0-0.7 K/uL 0.1   Basophils Absolute Latest Range: 0.0-0.1 K/uL 0.0    Results for Denise Davis, Denise Davis (MRN [id number]) as of 08/19/2012 08:41  Ref. Range 08/16/2012 19:45  TSH Latest Range: 0.350-4.500 uIU/mL 0.503   Results for Denise Davis, Denise Davis (MRN [id number]) as of 08/19/2012 08:41  Ref. Range 08/16/2012 16:02  Amphetamines Latest Range: NONE DETECTED  NONE DETECTED  Barbiturates Latest Range: NONE DETECTED  NONE DETECTED  Benzodiazepines Latest Range: NONE DETECTED  POSITIVE (A)  Opiates Latest Range: NONE DETECTED  NONE DETECTED  COCAINE Latest Range: NONE DETECTED  NONE DETECTED  Tetrahydrocannabinol Latest Range: NONE DETECTED  NONE DETECTED   Results for Denise Davis, Denise Davis (MRN [id number]) as of 08/19/2012 08:41  Ref. Range 08/16/2012 19:05  MRSA by PCR Latest Range: NEGATIVE  POSITIVE (A)     Results for orders placed during the hospital encounter of 08/16/12 (from the past 24 hour(s))  GLUCOSE, CAPILLARY     Status: Abnormal   Collection Time    08/18/12  4:36 PM      Result Value Range   Glucose-Capillary 137 (*) 70 - 99 mg/dL  GLUCOSE, CAPILLARY     Status: Abnormal    Collection Time    08/18/12  7:36 PM      Result Value Range   Glucose-Capillary 185 (*) 70 - 99 mg/dL  GLUCOSE, CAPILLARY     Status: Abnormal   Collection Time    08/18/12 10:16 PM      Result Value Range   Glucose-Capillary 170 (*) 70 - 99 mg/dL  GLUCOSE, CAPILLARY     Status: Abnormal   Collection Time    08/19/12  7:39 AM      Result Value Range   Glucose-Capillary 140 (*) 70 - 99 mg/dL  GLUCOSE, CAPILLARY     Status: Abnormal   Collection Time    08/19/12 11:36 AM      Result Value Range   Glucose-Capillary 155 (*) 70 - 99 mg/dL    Signed: Annett Gula 08/19/2012, 1:35 PM   Time Spent on Discharge: >30 minutes  Services Ordered on Discharge: SLP Equipment Ordered on Discharge: none

## 2012-08-19 NOTE — Care Management Note (Signed)
    Page 1 of 1   08/19/2012     3:06:55 PM   CARE MANAGEMENT NOTE 08/19/2012  Patient:  Denise Davis, Denise Davis   Account Number:  [id number]  Date Initiated:  08/19/2012  Documentation initiated by:  Satine Hausner  Subjective/Objective Assessment:   PT ADM ON 5/18 WITH CHEST PAIN, SOB.  PTA, PT RESIDES AT HOME WITH SPOUSE AND IS INDEPENDENT.     Action/Plan:   WILL FOLLOW FOR HOME NEEDS AS PT PROGRESSES.   Anticipated DC Date:  08/19/2012   Anticipated DC Plan:  HOME/SELF CARE      DC Planning Services  CM consult      Choice offered to / List presented to:             Status of service:  Completed, signed off Medicare Important Message given?   (If response is "NO", the following Medicare IM given date fields will be blank) Date Medicare IM given:   Date Additional Medicare IM given:    Discharge Disposition:  HOME/SELF CARE  Per UR Regulation:  Reviewed for med. necessity/level of care/duration of stay  If discussed at Long Length of Stay Meetings, dates discussed:    Comments:

## 2012-08-19 NOTE — ED Provider Notes (Signed)
Medical screening examination/treatment/procedure(s) were conducted as a shared visit with non-physician practitioner(s) and myself.  I personally evaluated the patient during the encounter  Denise Davis is a 47 y.o. female hx of renal cell carcinoma s/p nephrectomy, DVT not on coumadin, obesity, here with wheezing and stridor. Started on 4 am in the morning today. Came to church and symptoms worsened. She had exacerbations before and was recently treated and released in [facility] ED. On exam, she was uncomfortable and has audible inspiratory stridor. She also appeared anxious. No wheezing on exam. I ordered racemic epi and decadron for stridor. CXR and lateral neck xray unremarkable. She was also given ativan for anxiety. However, after 2 hrs in the ED, her symptoms are not improved. The PA called internal medicine, who will admit to monitor the patient. I think she likely has stridor from vocal cord dysfunction vs anxiety induced, less likely to be asthma or pulmonary embolus. She will need close monitoring of respiratory status. Medicine will call [name] for eval.   Level V caveat- condition of the patient   CRITICAL CARE Performed by: Silverio Lay, Chinelo Benn   Total critical care time: 45 min   Critical care time was exclusive of separately billable procedures and treating other patients.  Critical care was necessary to treat or prevent imminent or life-threatening deterioration.  Critical care was time spent personally by me on the following activities: development of treatment plan with patient and/or surrogate as well as nursing, discussions with consultants, evaluation of patient's response to treatment, examination of patient, obtaining history from patient or surrogate, ordering and performing treatments and interventions, ordering and review of laboratory studies, ordering and review of radiographic studies, pulse oximetry and re-evaluation of patient's condition.    Richardean Canal, MD 08/19/12 [phone]

## 2012-08-19 NOTE — Progress Notes (Signed)
Subjective:  Patient is says the new Tranxene and Ativan are helping.  She states she woke up one time last night due to feeling sob and was given Ativan and a breathing treatment which helped.  She denies other complaints.    Objective: Vital signs in last 24 hours: Filed Vitals:   08/18/12 2208 08/19/12 0115 08/19/12 0638 08/19/12 0803  BP: 125/60   128/93  Pulse: 92 92  113  Temp: 98.1 F (36.7 C)     TempSrc: Oral     Resp: 18 18    Height:      Weight:   370 lb 13 oz (168.2 kg)   SpO2: 100% 100%     Weight change: 0 lb (0 kg)  Intake/Output Summary (Last 24 hours) at 08/19/12 0954 Last data filed at 08/18/12 1800  Gross per 24 hour  Intake    843 ml  Output    300 ml  Net    543 ml   Vitals reviewed. General: resting in bed, NAD HEENT: [place]/at,  no scleral icterus Cardiac: slightly tachycardic, no rubs, murmurs or gallops Pulm: clear to auscultation bilaterally, upper airway wheezing noted  Abd: soft, nontender, nondistended, BS present, scar from umbilical hernia repair around umbilicus  Ext: warm and well perfused, no pedal edema Neuro: alert and oriented X3, Neurologically intact, moving all 4 extremities   Lab Results: Basic Metabolic Panel:  Recent Labs Lab 08/16/12 1205 08/17/12 0428  NA 136 135  K 3.8 3.5  CL 98 98  CO2 28 25  GLUCOSE 152* 150*  BUN 14 15  CREATININE 1.01 1.03  CALCIUM 9.7 9.4   Liver Function Tests:  Recent Labs Lab 08/16/12 1945  AST 16  ALT 19  ALKPHOS 84  BILITOT 0.3  PROT 7.7  ALBUMIN 3.8   CBC:  Recent Labs Lab 08/16/12 1205 08/17/12 0428  WBC 5.7 6.1  NEUTROABS 2.6  --   HGB 11.8* 11.5*  HCT 36.0 35.0*  MCV 86.5 86.6  PLT 318 291   CBG:  Recent Labs Lab 08/18/12 0744 08/18/12 1204 08/18/12 1636 08/18/12 1936 08/18/12 2216 08/19/12 0739  GLUCAP 187* 132* 137* 185* 170* 140*   Thyroid Function Tests:  Recent Labs Lab 08/16/12 1945  TSH 0.503   Urine Drug Screen: Drugs of Abuse      Component Value Date/Time   LABOPIA NONE DETECTED 08/16/2012 1602   COCAINSCRNUR NONE DETECTED 08/16/2012 1602   LABBENZ POSITIVE* 08/16/2012 1602   AMPHETMU NONE DETECTED 08/16/2012 1602   THCU NONE DETECTED 08/16/2012 1602   LABBARB NONE DETECTED 08/16/2012 1602    Misc. Labs: None   Micro Results: Recent Results (from the past 240 hour(s))  MRSA PCR SCREENING     Status: Abnormal   Collection Time    08/16/12  7:05 PM      Result Value Range Status   MRSA by PCR POSITIVE (*) NEGATIVE Final   Comment:            The GeneXpert MRSA Assay (FDA     approved for NASAL specimens     only), is one component of a     comprehensive MRSA colonization     surveillance program. It is not     intended to diagnose MRSA     infection nor to guide or     monitor treatment for     MRSA infections.     RESULT CALLED TO, READ BACK BY AND VERIFIED WITH:  [facility] 08/17/12 0657 BY BSLADE   Studies/Results: No results found. Medications:  Scheduled Meds: . amitriptyline  25-100 mg Oral QHS  . aspirin EC  81 mg Oral Daily  . benzonatate  100 mg Oral BID  . carvedilol  12.5 mg Oral BID WC  . Chlorhexidine Gluconate Cloth  6 each Topical Q0600  . clorazepate  7.5 mg Oral BID  . enoxaparin (LOVENOX) injection  80 mg Subcutaneous Q24H  . gabapentin  600-2,400 mg Oral QHS  . hydrochlorothiazide  25 mg Oral Daily  . insulin aspart  0-5 Units Subcutaneous QHS  . insulin aspart  0-9 Units Subcutaneous TID WC  . loratadine  10 mg Oral Daily  . losartan  12.5 mg Oral Daily  . mupirocin ointment  1 application Nasal BID  . pantoprazole  40 mg Oral Daily  . sodium chloride  3 mL Intravenous Q12H  . sodium chloride  3 mL Intravenous Q12H   Continuous Infusions:  PRN Meds:.sodium chloride, levalbuterol, LORazepam, ondansetron (ZOFRAN) IV, ondansetron, oxyCODONE-acetaminophen, sodium chloride  Assessment/Plan: 47 y.o no history of asthma, morbid obesity presents for upper chest  pain/tightness, shortness of breath, wheezing, and hoarseness.   1. Paradoxical vocal cord dysfunction -Rx Tranxene 7.5 mg bid and Ativan 1 mg po q 6 prn for anxiety helping -[name] consulted-Dr. Emeline Darling evaluated rec. SLP, voice therapy, anxiolytics prn, consider CBT. Patient prefers [facility] for specialist treatment.  Arranged with Alcide Goodness [place] [phone] [address] [place] [address] -prn Xopenex q6 prn  -she will f/u with her PCP Dr. Marvis Moeller 08/25/12 2:40 PM  2. Chest pain  -Unclear etiology.  She has a history of chronic/persistent chest pain. Etiology may be multifactorial seems pleritic, and/or musculoskeletal and/or possibly psychiatric. Patient has a history of recurrent pericarditis and persistent chest pain (per [facility] records). She can have throat tightness with vocal cord dysfunction -Percocet prn   3. History of HTN  -BP 125/60 -Continue HCTZ 25 mg qd, On Coreg 12.5 mg bid,  continue Asprin 81 mg qd, Cozaar 12.5 mg qd .Medication will need to be called into pharmacy [phone] -Monitor VS   4. DM 2 (HA1C 6.2 04/2009)  -Hold Metformin 1000 mg bid, Glipizide 2.5 mg bid  -Monitor cbg, SSI   5. History of DVT  -Previously treated with Coumadin x 6 months  -Patient is currently asymptomatic without calf pain and saturating well on room air   6. History of RCC status postnephrectomy  -No history of chemo or radiation status post nephrectomy   7. Anxiety  -Tranxene 7.5 mg bid and Ativan 1 mg po q 6 prn for anxiety   8. MRSA +nares  -MRSA protocol   9. Dyspnea -intermittent.  Noted last night to awake during sleep and patient snores.  -She likely will need outpatient sleep study to w/u for OSA which she may have versus OHS or both -will ambulate today with pulse ox   10. DVT px  -Lovenox   11. F/E/N  -will monitor and replace electrolytes prn  -carb mod   Dispo: D/c 5/21 with PCP f/u Dr. Marvis Moeller 08/25/12 2:40 PM Fax # [phone]  The  patient does have a current PCP (MILES,LINDA M, MD), therefore will not be requiring OPC follow-up after discharge.  Unknown if the patient has transportation limitations that hinder transportation to clinic appointments.    .Services Needed at time of discharge: Y = Yes, Blank = No PT: No recommendations.  Did well except for stopping due to dyspnea  OT:   RN:   Equipment:   Other:     LOS: 3 days   Annett Gula [phone] 08/19/2012, 9:54 AM

## 2012-08-19 NOTE — Progress Notes (Signed)
Pt ambulated 250 ft in hallway with RN using front wheel walker. Pt's 02 sats resting and ambulating remained between 96-100%. Pt's HR sustained in the 120's while ambulated. Few rest breaks taken. Will continue to monitor.

## 2012-08-19 NOTE — Progress Notes (Signed)
Pt educated and informed of DC information. Pt verbalizes understanding of f/u appts and medications. IV and tele box removed. Pt ready for DC with husband and son.

## 2012-08-25 DIAGNOSIS — J3800 Paralysis of vocal cords and larynx, unspecified: Secondary | ICD-10-CM | POA: Insufficient documentation

## 2012-09-04 DIAGNOSIS — R490 Dysphonia: Secondary | ICD-10-CM | POA: Insufficient documentation

## 2012-10-27 DIAGNOSIS — F331 Major depressive disorder, recurrent, moderate: Secondary | ICD-10-CM | POA: Insufficient documentation

## 2012-11-15 ENCOUNTER — Emergency Department: Payer: Self-pay | Admitting: Internal Medicine

## 2012-11-15 LAB — CBC
MCH: 30.2 pg (ref 26.0–34.0)
MCHC: 35 g/dL (ref 32.0–36.0)
MCV: 86 fL (ref 80–100)
Platelet: 256 10*3/uL (ref 150–440)
RBC: 4.18 10*6/uL (ref 3.80–5.20)
RDW: 15.4 % — ABNORMAL HIGH (ref 11.5–14.5)
WBC: 5.4 10*3/uL (ref 3.6–11.0)

## 2012-11-15 LAB — COMPREHENSIVE METABOLIC PANEL
Albumin: 3.4 g/dL (ref 3.4–5.0)
Alkaline Phosphatase: 96 U/L (ref 50–136)
Anion Gap: 9 (ref 7–16)
BUN: 10 mg/dL (ref 7–18)
Bilirubin,Total: 0.5 mg/dL (ref 0.2–1.0)
Calcium, Total: 9.3 mg/dL (ref 8.5–10.1)
Chloride: 102 mmol/L (ref 98–107)
Co2: 23 mmol/L (ref 21–32)
Creatinine: 1.1 mg/dL (ref 0.60–1.30)
EGFR (African American): >60
EGFR (Non-African Amer.): 60 — ABNORMAL LOW
Glucose: 119 mg/dL — ABNORMAL HIGH (ref 65–99)
SGOT(AST): 25 U/L (ref 15–37)
SGPT (ALT): 27 U/L (ref 12–78)

## 2013-02-11 ENCOUNTER — Encounter: Payer: Self-pay | Admitting: Family Medicine

## 2013-03-01 ENCOUNTER — Encounter: Payer: Self-pay | Admitting: Family Medicine

## 2013-03-26 ENCOUNTER — Emergency Department: Payer: Self-pay | Admitting: Emergency Medicine

## 2013-03-26 LAB — COMPREHENSIVE METABOLIC PANEL
Alkaline Phosphatase: 99 U/L
Chloride: 101 mmol/L (ref 98–107)
Creatinine: 1.12 mg/dL (ref 0.60–1.30)
EGFR (African American): >60
Osmolality: 271 (ref 275–301)
Potassium: 4 mmol/L (ref 3.5–5.1)
Sodium: 134 mmol/L — ABNORMAL LOW (ref 136–145)
Total Protein: 7.7 g/dL (ref 6.4–8.2)

## 2013-03-26 LAB — CBC
HGB: 12.5 g/dL (ref 12.0–16.0)
MCH: 28.8 pg (ref 26.0–34.0)
MCHC: 33.1 g/dL (ref 32.0–36.0)
MCV: 87 fL (ref 80–100)
RBC: 4.34 10*6/uL (ref 3.80–5.20)

## 2013-03-26 LAB — TROPONIN I: Troponin-I: <0.02 ng/mL

## 2013-04-24 ENCOUNTER — Inpatient Hospital Stay: Payer: Self-pay | Admitting: Internal Medicine

## 2013-04-24 LAB — COMPREHENSIVE METABOLIC PANEL
AST: 21 U/L (ref 15–37)
Albumin: 3.9 g/dL (ref 3.4–5.0)
Alkaline Phosphatase: 96 U/L
Anion Gap: 7 (ref 7–16)
BUN: 17 mg/dL (ref 7–18)
Bilirubin,Total: 0.2 mg/dL (ref 0.2–1.0)
CHLORIDE: 100 mmol/L (ref 98–107)
Calcium, Total: 9.3 mg/dL (ref 8.5–10.1)
Co2: 26 mmol/L (ref 21–32)
Creatinine: 1.17 mg/dL (ref 0.60–1.30)
GFR CALC NON AF AMER: 55 — AB
Glucose: 127 mg/dL — ABNORMAL HIGH (ref 65–99)
OSMOLALITY: 270 (ref 275–301)
Potassium: 3.9 mmol/L (ref 3.5–5.1)
SGPT (ALT): 31 U/L (ref 12–78)
Sodium: 133 mmol/L — ABNORMAL LOW (ref 136–145)
Total Protein: 8.4 g/dL — ABNORMAL HIGH (ref 6.4–8.2)

## 2013-04-24 LAB — CBC
HCT: 39.2 % (ref 35.0–47.0)
HGB: 12.8 g/dL (ref 12.0–16.0)
MCH: 28.4 pg (ref 26.0–34.0)
MCHC: 32.5 g/dL (ref 32.0–36.0)
MCV: 87 fL (ref 80–100)
Platelet: 344 10*3/uL (ref 150–440)
RBC: 4.49 10*6/uL (ref 3.80–5.20)
RDW: 15.4 % — ABNORMAL HIGH (ref 11.5–14.5)
WBC: 10.3 10*3/uL (ref 3.6–11.0)

## 2013-04-24 LAB — PRO B NATRIURETIC PEPTIDE: B-Type Natriuretic Peptide: 141 pg/mL — ABNORMAL HIGH (ref 0–125)

## 2013-04-24 LAB — TROPONIN I

## 2013-04-25 LAB — BASIC METABOLIC PANEL
ANION GAP: 7 (ref 7–16)
BUN: 16 mg/dL (ref 7–18)
CHLORIDE: 99 mmol/L (ref 98–107)
CO2: 26 mmol/L (ref 21–32)
Calcium, Total: 9.2 mg/dL (ref 8.5–10.1)
Creatinine: 1.13 mg/dL (ref 0.60–1.30)
EGFR (Non-African Amer.): 58 — ABNORMAL LOW
GLUCOSE: 195 mg/dL — AB (ref 65–99)
Osmolality: 271 (ref 275–301)
Potassium: 4 mmol/L (ref 3.5–5.1)
Sodium: 132 mmol/L — ABNORMAL LOW (ref 136–145)

## 2013-04-26 LAB — BASIC METABOLIC PANEL
Anion Gap: 6 — ABNORMAL LOW (ref 7–16)
BUN: 21 mg/dL — AB (ref 7–18)
CREATININE: 1.09 mg/dL (ref 0.60–1.30)
Calcium, Total: 9 mg/dL (ref 8.5–10.1)
Chloride: 102 mmol/L (ref 98–107)
Co2: 26 mmol/L (ref 21–32)
EGFR (Non-African Amer.): >60
Glucose: 188 mg/dL — ABNORMAL HIGH (ref 65–99)
OSMOLALITY: 276 (ref 275–301)
POTASSIUM: 3.9 mmol/L (ref 3.5–5.1)
SODIUM: 134 mmol/L — AB (ref 136–145)

## 2013-04-29 DIAGNOSIS — J189 Pneumonia, unspecified organism: Secondary | ICD-10-CM | POA: Insufficient documentation

## 2013-05-02 ENCOUNTER — Inpatient Hospital Stay: Payer: Self-pay | Admitting: Internal Medicine

## 2013-05-02 LAB — CBC WITH DIFFERENTIAL/PLATELET
BASOS PCT: 1.1 %
Basophil #: 0.2 10*3/uL — ABNORMAL HIGH (ref 0.0–0.1)
EOS ABS: 0.1 10*3/uL (ref 0.0–0.7)
Eosinophil %: 0.4 %
HCT: 41.6 % (ref 35.0–47.0)
HGB: 13.3 g/dL (ref 12.0–16.0)
LYMPHS ABS: 4.1 10*3/uL — AB (ref 1.0–3.6)
LYMPHS PCT: 24.9 %
MCH: 28.1 pg (ref 26.0–34.0)
MCHC: 32 g/dL (ref 32.0–36.0)
MCV: 88 fL (ref 80–100)
MONO ABS: 1.6 x10 3/mm — AB (ref 0.2–0.9)
Monocyte %: 9.6 %
Neutrophil #: 10.7 10*3/uL — ABNORMAL HIGH (ref 1.4–6.5)
Neutrophil %: 64 %
PLATELETS: 405 10*3/uL (ref 150–440)
RBC: 4.74 10*6/uL (ref 3.80–5.20)
RDW: 16.2 % — ABNORMAL HIGH (ref 11.5–14.5)
WBC: 16.6 10*3/uL — ABNORMAL HIGH (ref 3.6–11.0)

## 2013-05-02 LAB — PRO B NATRIURETIC PEPTIDE: B-Type Natriuretic Peptide: 42 pg/mL (ref 0–125)

## 2013-05-02 LAB — COMPREHENSIVE METABOLIC PANEL
ALBUMIN: 3.4 g/dL (ref 3.4–5.0)
Alkaline Phosphatase: 97 U/L
Anion Gap: 5 — ABNORMAL LOW (ref 7–16)
BUN: 18 mg/dL (ref 7–18)
Bilirubin,Total: 0.2 mg/dL (ref 0.2–1.0)
CALCIUM: 9.3 mg/dL (ref 8.5–10.1)
CHLORIDE: 99 mmol/L (ref 98–107)
CREATININE: 1.18 mg/dL (ref 0.60–1.30)
Co2: 28 mmol/L (ref 21–32)
EGFR (African American): >60
EGFR (Non-African Amer.): 55 — ABNORMAL LOW
Glucose: 154 mg/dL — ABNORMAL HIGH (ref 65–99)
OSMOLALITY: 270 (ref 275–301)
Potassium: 4.3 mmol/L (ref 3.5–5.1)
SGOT(AST): 22 U/L (ref 15–37)
SGPT (ALT): 46 U/L (ref 12–78)
Sodium: 132 mmol/L — ABNORMAL LOW (ref 136–145)
TOTAL PROTEIN: 8 g/dL (ref 6.4–8.2)

## 2013-05-02 LAB — RAPID INFLUENZA A&B ANTIGENS

## 2013-05-02 LAB — TROPONIN I

## 2013-05-05 LAB — CBC WITH DIFFERENTIAL/PLATELET
BASOS ABS: 0.1 10*3/uL (ref 0.0–0.1)
Basophil %: 0.5 %
EOS ABS: 0 10*3/uL (ref 0.0–0.7)
Eosinophil %: 0.1 %
HCT: 36.3 % (ref 35.0–47.0)
HGB: 12.1 g/dL (ref 12.0–16.0)
LYMPHS PCT: 16 %
Lymphocyte #: 1.5 10*3/uL (ref 1.0–3.6)
MCH: 29.3 pg (ref 26.0–34.0)
MCHC: 33.2 g/dL (ref 32.0–36.0)
MCV: 88 fL (ref 80–100)
MONOS PCT: 4 %
Monocyte #: 0.4 x10 3/mm (ref 0.2–0.9)
NEUTROS ABS: 7.5 10*3/uL — AB (ref 1.4–6.5)
NEUTROS PCT: 79.4 %
Platelet: 259 10*3/uL (ref 150–440)
RBC: 4.12 10*6/uL (ref 3.80–5.20)
RDW: 15.9 % — ABNORMAL HIGH (ref 11.5–14.5)
WBC: 9.4 10*3/uL (ref 3.6–11.0)

## 2013-05-07 LAB — CULTURE, BLOOD (SINGLE)

## 2013-05-13 ENCOUNTER — Inpatient Hospital Stay: Payer: Self-pay | Admitting: Internal Medicine

## 2013-05-13 LAB — CBC
HCT: 36.7 % (ref 35.0–47.0)
HGB: 11.9 g/dL — AB (ref 12.0–16.0)
MCH: 28.8 pg (ref 26.0–34.0)
MCHC: 32.5 g/dL (ref 32.0–36.0)
MCV: 89 fL (ref 80–100)
PLATELETS: 241 10*3/uL (ref 150–440)
RBC: 4.14 10*6/uL (ref 3.80–5.20)
RDW: 16.2 % — AB (ref 11.5–14.5)
WBC: 10.4 10*3/uL (ref 3.6–11.0)

## 2013-05-13 LAB — URINALYSIS, COMPLETE
Bacteria: NONE SEEN
Bilirubin,UR: NEGATIVE
Blood: NEGATIVE
Ketone: NEGATIVE
Leukocyte Esterase: NEGATIVE
Nitrite: NEGATIVE
PH: 5 (ref 4.5–8.0)
Protein: NEGATIVE
RBC,UR: NONE SEEN /HPF (ref 0–5)
SPECIFIC GRAVITY: 1.022 (ref 1.003–1.030)
WBC UR: 1 /HPF (ref 0–5)

## 2013-05-13 LAB — COMPREHENSIVE METABOLIC PANEL
ALT: 62 U/L (ref 12–78)
Albumin: 3.2 g/dL — ABNORMAL LOW (ref 3.4–5.0)
Alkaline Phosphatase: 85 U/L
Anion Gap: 8 (ref 7–16)
BUN: 21 mg/dL — AB (ref 7–18)
Bilirubin,Total: 0.3 mg/dL (ref 0.2–1.0)
CHLORIDE: 102 mmol/L (ref 98–107)
CO2: 23 mmol/L (ref 21–32)
Calcium, Total: 9 mg/dL (ref 8.5–10.1)
Creatinine: 1.08 mg/dL (ref 0.60–1.30)
EGFR (African American): >60
EGFR (Non-African Amer.): >60
Glucose: 215 mg/dL — ABNORMAL HIGH (ref 65–99)
Osmolality: 276 (ref 275–301)
POTASSIUM: 4.2 mmol/L (ref 3.5–5.1)
SGOT(AST): 52 U/L — ABNORMAL HIGH (ref 15–37)
Sodium: 133 mmol/L — ABNORMAL LOW (ref 136–145)
Total Protein: 7.4 g/dL (ref 6.4–8.2)

## 2013-05-13 LAB — CK TOTAL AND CKMB (NOT AT ARMC): CK, Total: 34 U/L

## 2013-05-13 LAB — PRO B NATRIURETIC PEPTIDE: B-Type Natriuretic Peptide: 34 pg/mL (ref 0–125)

## 2013-05-13 LAB — MAGNESIUM: Magnesium: 1.7 mg/dL — ABNORMAL LOW

## 2013-05-13 LAB — TROPONIN I

## 2013-05-14 LAB — BASIC METABOLIC PANEL
Anion Gap: 11 (ref 7–16)
BUN: 21 mg/dL — AB (ref 7–18)
CALCIUM: 9.4 mg/dL (ref 8.5–10.1)
CO2: 20 mmol/L — AB (ref 21–32)
Chloride: 101 mmol/L (ref 98–107)
Creatinine: 1.11 mg/dL (ref 0.60–1.30)
EGFR (African American): >60
EGFR (Non-African Amer.): 59 — ABNORMAL LOW
Glucose: 251 mg/dL — ABNORMAL HIGH (ref 65–99)
Osmolality: 276 (ref 275–301)
Potassium: 4 mmol/L (ref 3.5–5.1)
Sodium: 132 mmol/L — ABNORMAL LOW (ref 136–145)

## 2013-05-14 LAB — CBC WITH DIFFERENTIAL/PLATELET
BASOS ABS: 0 10*3/uL (ref 0.0–0.1)
Basophil %: 0.4 %
EOS PCT: 0 %
Eosinophil #: 0 10*3/uL (ref 0.0–0.7)
HCT: 36.1 % (ref 35.0–47.0)
HGB: 11.6 g/dL — ABNORMAL LOW (ref 12.0–16.0)
LYMPHS ABS: 0.9 10*3/uL — AB (ref 1.0–3.6)
Lymphocyte %: 10.8 %
MCH: 28.6 pg (ref 26.0–34.0)
MCHC: 32.1 g/dL (ref 32.0–36.0)
MCV: 89 fL (ref 80–100)
Monocyte #: 0.1 x10 3/mm — ABNORMAL LOW (ref 0.2–0.9)
Monocyte %: 1.2 %
NEUTROS PCT: 87.6 %
Neutrophil #: 7.6 10*3/uL — ABNORMAL HIGH (ref 1.4–6.5)
Platelet: 227 10*3/uL (ref 150–440)
RBC: 4.06 10*6/uL (ref 3.80–5.20)
RDW: 16.3 % — ABNORMAL HIGH (ref 11.5–14.5)
WBC: 8.7 10*3/uL (ref 3.6–11.0)

## 2013-05-14 LAB — TROPONIN I: Troponin-I: <0.02 ng/mL

## 2013-05-18 LAB — PLATELET COUNT: PLATELETS: 256 10*3/uL (ref 150–440)

## 2013-05-18 LAB — CULTURE, BLOOD (SINGLE)

## 2013-05-20 LAB — CULTURE, BLOOD (SINGLE)

## 2013-06-01 DIAGNOSIS — B029 Zoster without complications: Secondary | ICD-10-CM | POA: Insufficient documentation

## 2013-07-08 DIAGNOSIS — H6690 Otitis media, unspecified, unspecified ear: Secondary | ICD-10-CM | POA: Insufficient documentation

## 2013-07-08 DIAGNOSIS — S060XAA Concussion with loss of consciousness status unknown, initial encounter: Secondary | ICD-10-CM | POA: Insufficient documentation

## 2013-07-08 DIAGNOSIS — G4700 Insomnia, unspecified: Secondary | ICD-10-CM | POA: Insufficient documentation

## 2013-07-08 DIAGNOSIS — S060X9A Concussion with loss of consciousness of unspecified duration, initial encounter: Secondary | ICD-10-CM | POA: Insufficient documentation

## 2013-08-24 ENCOUNTER — Inpatient Hospital Stay (HOSPITAL_COMMUNITY)
Admission: EM | Admit: 2013-08-24 | Discharge: 2013-08-30 | DRG: 287 | Disposition: A | Discharge status: Home / Self Care | Payer: Medicaid Other | Attending: Internal Medicine | Admitting: Internal Medicine

## 2013-08-24 ENCOUNTER — Emergency Department (HOSPITAL_COMMUNITY): Payer: Medicaid Other

## 2013-08-24 ENCOUNTER — Encounter (HOSPITAL_COMMUNITY): Payer: Self-pay | Admitting: Emergency Medicine

## 2013-08-24 DIAGNOSIS — Z86718 Personal history of other venous thrombosis and embolism: Secondary | ICD-10-CM

## 2013-08-24 DIAGNOSIS — G8190 Hemiplegia, unspecified affecting unspecified side: Secondary | ICD-10-CM | POA: Diagnosis present

## 2013-08-24 DIAGNOSIS — Z7982 Long term (current) use of aspirin: Secondary | ICD-10-CM

## 2013-08-24 DIAGNOSIS — J449 Chronic obstructive pulmonary disease, unspecified: Secondary | ICD-10-CM | POA: Diagnosis present

## 2013-08-24 DIAGNOSIS — R531 Weakness: Secondary | ICD-10-CM

## 2013-08-24 DIAGNOSIS — Z87891 Personal history of nicotine dependence: Secondary | ICD-10-CM

## 2013-08-24 DIAGNOSIS — I10 Essential (primary) hypertension: Secondary | ICD-10-CM

## 2013-08-24 DIAGNOSIS — D649 Anemia, unspecified: Secondary | ICD-10-CM | POA: Diagnosis present

## 2013-08-24 DIAGNOSIS — Z85528 Personal history of other malignant neoplasm of kidney: Secondary | ICD-10-CM

## 2013-08-24 DIAGNOSIS — M25569 Pain in unspecified knee: Secondary | ICD-10-CM | POA: Diagnosis present

## 2013-08-24 DIAGNOSIS — Z6841 Body Mass Index (BMI) 40.0 and over, adult: Secondary | ICD-10-CM

## 2013-08-24 DIAGNOSIS — R079 Chest pain, unspecified: Secondary | ICD-10-CM

## 2013-08-24 DIAGNOSIS — R55 Syncope and collapse: Secondary | ICD-10-CM

## 2013-08-24 DIAGNOSIS — Z993 Dependence on wheelchair: Secondary | ICD-10-CM

## 2013-08-24 DIAGNOSIS — R0789 Other chest pain: Principal | ICD-10-CM | POA: Diagnosis present

## 2013-08-24 DIAGNOSIS — J9610 Chronic respiratory failure, unspecified whether with hypoxia or hypercapnia: Secondary | ICD-10-CM

## 2013-08-24 DIAGNOSIS — J383 Other diseases of vocal cords: Secondary | ICD-10-CM

## 2013-08-24 DIAGNOSIS — Z9981 Dependence on supplemental oxygen: Secondary | ICD-10-CM

## 2013-08-24 DIAGNOSIS — E119 Type 2 diabetes mellitus without complications: Secondary | ICD-10-CM

## 2013-08-24 DIAGNOSIS — R0603 Acute respiratory distress: Secondary | ICD-10-CM

## 2013-08-24 DIAGNOSIS — Z79899 Other long term (current) drug therapy: Secondary | ICD-10-CM

## 2013-08-24 DIAGNOSIS — R0602 Shortness of breath: Secondary | ICD-10-CM

## 2013-08-24 DIAGNOSIS — F411 Generalized anxiety disorder: Secondary | ICD-10-CM

## 2013-08-24 DIAGNOSIS — R062 Wheezing: Secondary | ICD-10-CM

## 2013-08-24 DIAGNOSIS — G8929 Other chronic pain: Secondary | ICD-10-CM | POA: Diagnosis present

## 2013-08-24 DIAGNOSIS — J4489 Other specified chronic obstructive pulmonary disease: Secondary | ICD-10-CM | POA: Diagnosis present

## 2013-08-24 LAB — CBC WITH DIFFERENTIAL/PLATELET
BASOS PCT: 1 % (ref 0–1)
Basophils Absolute: 0 10*3/uL (ref 0.0–0.1)
Eosinophils Absolute: 0.2 10*3/uL (ref 0.0–0.7)
Eosinophils Relative: 3 % (ref 0–5)
HEMATOCRIT: 32.4 % — AB (ref 36.0–46.0)
HEMOGLOBIN: 10.5 g/dL — AB (ref 12.0–15.0)
LYMPHS ABS: 2.2 10*3/uL (ref 0.7–4.0)
Lymphocytes Relative: 39 % (ref 12–46)
MCH: 28.8 pg (ref 26.0–34.0)
MCHC: 32.4 g/dL (ref 30.0–36.0)
MCV: 88.8 fL (ref 78.0–100.0)
MONO ABS: 0.4 10*3/uL (ref 0.1–1.0)
MONOS PCT: 8 % (ref 3–12)
NEUTROS ABS: 2.8 10*3/uL (ref 1.7–7.7)
Neutrophils Relative %: 49 % (ref 43–77)
Platelets: 277 10*3/uL (ref 150–400)
RBC: 3.65 MIL/uL — AB (ref 3.87–5.11)
RDW: 14.7 % (ref 11.5–15.5)
WBC: 5.6 10*3/uL (ref 4.0–10.5)

## 2013-08-24 LAB — COMPREHENSIVE METABOLIC PANEL
ALBUMIN: 3.3 g/dL — AB (ref 3.5–5.2)
ALK PHOS: 88 U/L (ref 39–117)
ALT: 15 U/L (ref 0–35)
AST: 15 U/L (ref 0–37)
BUN: 14 mg/dL (ref 6–23)
CHLORIDE: 98 meq/L (ref 96–112)
CO2: 24 meq/L (ref 19–32)
CREATININE: 0.88 mg/dL (ref 0.50–1.10)
Calcium: 9 mg/dL (ref 8.4–10.5)
GFR, EST AFRICAN AMERICAN: 89 mL/min — AB (ref >90)
GFR, EST NON AFRICAN AMERICAN: 77 mL/min — AB (ref >90)
GLUCOSE: 176 mg/dL — AB (ref 70–99)
Potassium: 4.4 mEq/L (ref 3.7–5.3)
Sodium: 136 mEq/L — ABNORMAL LOW (ref 137–147)
Total Protein: 7.6 g/dL (ref 6.0–8.3)

## 2013-08-24 LAB — I-STAT TROPONIN, ED: TROPONIN I, POC: 0.01 ng/mL (ref 0.00–0.08)

## 2013-08-24 LAB — CBG MONITORING, ED: GLUCOSE-CAPILLARY: 128 mg/dL — AB (ref 70–99)

## 2013-08-24 NOTE — ED Notes (Addendum)
Per EMS: pt was just seen and released from [facility] for same symptoms. Pt was in car driving home when they called EMS for unable to wake patient. EMS arrived and patient was sternal rubbed and ambulated out or the car. Pt then complained of SOB, pt making stridor noises with throat. But when she talks the wheezing/stridor goes away.

## 2013-08-24 NOTE — ED Notes (Signed)
Pt remains in xray.

## 2013-08-24 NOTE — ED Provider Notes (Signed)
CSN: [id number]     Arrival date & time 08/24/13  2049 History   First MD Initiated Contact with Patient 08/24/13 2052     Chief Complaint  Patient presents with  . Loss of Consciousness  . Shortness of Breath     (Consider location/radiation/quality/duration/timing/severity/associated sxs/prior Treatment) Patient is a 48 y.o. female presenting with syncope and shortness of breath. The history is provided by the patient and the EMS personnel. No language interpreter was used.  Loss of Consciousness Episode history:  Single Most recent episode:  Today Duration: unknown. Progression:  Improving Chronicity:  New Context: normal activity   Witnessed: yes (by family)   Associated symptoms: chest pain, confusion, shortness of breath and weakness   Associated symptoms: no fever, no nausea and no recent injury   Shortness of Breath Associated symptoms: chest pain and syncope   Associated symptoms: no abdominal pain and no fever   EMS note indicates patient was seen at [facility] today for same symptoms, however there are not any notes or diagnostic results present in the patient's chart from today.    Past Medical History  Diagnosis Date  . Diabetes mellitus   . Hypertension   . DVT of leg (deep venous thrombosis) 12/2007    left leg, given warfarin for 6 mos  . Morbid obesity   . Renal cell cancer 2009    S/p nephrectomy (left)  . Pericarditis     Diagnosed/treated at [facility]; per patient 4 episodes per cardiologist Dr. Edwin Dada persistant chest pain unclear if pericarditis verusus neuropsychogenic   . H/O hiatal hernia   . Anxiety   . Shortness of breath    Past Surgical History  Procedure Laterality Date  . Endometrial ablation  2007  . Tubal ligation    . Insertion of mesh  2014  . Umbilical hernia repair     Family History  Problem Relation Age of Onset  . Hypertension Mother   . Breast cancer Mother   . Heart disease Paternal Grandmother    History  Substance Use Topics  .  Smoking status: Former Research scientist (life sciences)  . Smokeless tobacco: Never Used  . Alcohol Use: No   OB History   Grav Para Term Preterm Abortions TAB SAB Ect Mult Living                 Review of Systems  Constitutional: Negative for fever.  Respiratory: Positive for shortness of breath.   Cardiovascular: Positive for chest pain and syncope. Negative for leg swelling.  Gastrointestinal: Negative for nausea and abdominal pain.  Neurological: Positive for syncope and weakness.  Psychiatric/Behavioral: Positive for confusion.  All other systems reviewed and are negative.     Allergies  Milk-related compounds  Home Medications   Prior to Admission medications   Medication Sig Start Date End Date Taking? Authorizing Provider  amitriptyline (ELAVIL) 25 MG tablet Take 25-100 mg by mouth at bedtime.     Historical Provider, MD  aspirin EC 81 MG tablet Take 81 mg by mouth daily.    Historical Provider, MD  benzonatate (TESSALON) 100 MG capsule Take 1 capsule (100 mg total) by mouth 2 (two) times daily. 08/19/12   Cresenciano Genre, MD  carvedilol (COREG) 12.5 MG tablet Take 12.5 mg by mouth 2 (two) times daily with a meal.    Historical Provider, MD  clorazepate (TRANXENE) 7.5 MG tablet Take 1 tablet (7.5 mg total) by mouth 2 (two) times daily. 08/19/12   Cresenciano Genre, MD  gabapentin (NEURONTIN) 600 MG tablet Take 600-2,400 mg by mouth at bedtime. Start with 600 mg and may take up to 2400 mg    Historical Provider, MD  glipiZIDE (GLUCOTROL) 5 MG tablet Take 2.5 mg by mouth 2 (two) times daily before a meal.    Historical Provider, MD  hydrochlorothiazide (HYDRODIURIL) 25 MG tablet Take 25 mg by mouth daily.    Historical Provider, MD  loratadine (CLARITIN) 10 MG tablet Take 1 tablet (10 mg total) by mouth daily. 08/19/12   Cresenciano Genre, MD  LORazepam (ATIVAN) 1 MG tablet Take 1 tablet (1 mg total) by mouth every 6 (six) hours as needed for anxiety. 08/19/12   Cresenciano Genre, MD  losartan (COZAAR) 25 MG  tablet Take 0.5 tablets (12.5 mg total) by mouth daily. 08/19/12   Cresenciano Genre, MD  metFORMIN (GLUCOPHAGE) 1000 MG tablet Take 1,000 mg by mouth 2 (two) times daily with a meal.    Historical Provider, MD  mupirocin ointment (BACTROBAN) 2 % Apply 1 application topically 2 (two) times daily. 08/19/12   Cresenciano Genre, MD  pantoprazole (PROTONIX) 40 MG tablet Take 1 tablet (40 mg total) by mouth daily. 08/19/12   Cresenciano Genre, MD  promethazine (PHENERGAN) 50 MG tablet Take 50 mg by mouth every 6 (six) hours as needed. For nausea    Historical Provider, MD   BP 152/91  Pulse 99  Temp(Src) 98.9 F (37.2 C) (Oral)  Resp 24  SpO2 100% Physical Exam  Nursing note and vitals reviewed. Constitutional: She is oriented to person, place, and time. She appears well-developed and well-nourished.  HENT:  Head: Normocephalic and atraumatic.  Mouth/Throat: Oropharynx is clear and moist. No oropharyngeal exudate.  Eyes: Pupils are equal, round, and reactive to light. Right conjunctiva is injected. Left conjunctiva is injected. Right eye exhibits nystagmus. Left eye exhibits nystagmus.  Neck: Normal range of motion.  Cardiovascular: Normal rate and regular rhythm.   Pulmonary/Chest: Effort normal and breath sounds normal.  Abdominal: Soft. Bowel sounds are normal.  Musculoskeletal: She exhibits no edema and no tenderness.  Lymphadenopathy:    She has no cervical adenopathy.  Neurological: She is alert and oriented to person, place, and time. She displays tremor. No cranial nerve deficit or sensory deficit. Coordination normal. GCS eye subscore is 4. GCS verbal subscore is 5. GCS motor subscore is 6.  Skin: Skin is warm and dry.  Psychiatric: Her mood appears anxious. Her speech is delayed. She is inattentive.    ED Course  Procedures (including critical care time) Labs Review Labs Reviewed  CBG MONITORING, ED - Abnormal; Notable for the following:    Glucose-Capillary 128 (*)    All other  components within normal limits  CBC WITH DIFFERENTIAL  COMPREHENSIVE METABOLIC PANEL  URINALYSIS, ROUTINE W REFLEX MICROSCOPIC  I-STAT TROPOININ, ED    Imaging Review No results found.   EKG Interpretation None     Patient discussed with and seen by Dr. Betsey Holiday.  As patient reports dyspnea, pleuritic chest pain, will add CTA of chest to r/o PE--study negative.  Due to syncope without prodrome, will request admission.  Patient admitted to hospitalist service. MDM   Final diagnoses:  None    Syncope.    Norman Herrlich, NP 08/25/13 [phone]

## 2013-08-24 NOTE — ED Notes (Signed)
Pt states HA, CP and SOB. Pt states that she has center CP that radiates to her back. Pt states that she has a HA as well and her SOB just started. Pt states she remembers being in a car and driving on the highway. Pt alert to person, time and place. Pt moving all extremities, and able to follow commands.

## 2013-08-24 NOTE — ED Notes (Signed)
Pt returned from xray; phleb at bedside.

## 2013-08-25 ENCOUNTER — Other Ambulatory Visit (HOSPITAL_COMMUNITY): Payer: Self-pay

## 2013-08-25 ENCOUNTER — Observation Stay (HOSPITAL_COMMUNITY): Payer: Medicaid Other

## 2013-08-25 ENCOUNTER — Emergency Department (HOSPITAL_COMMUNITY): Payer: Medicaid Other

## 2013-08-25 ENCOUNTER — Encounter (HOSPITAL_COMMUNITY): Payer: Self-pay | Admitting: *Deleted

## 2013-08-25 DIAGNOSIS — M6281 Muscle weakness (generalized): Secondary | ICD-10-CM

## 2013-08-25 DIAGNOSIS — I10 Essential (primary) hypertension: Secondary | ICD-10-CM

## 2013-08-25 DIAGNOSIS — R531 Weakness: Secondary | ICD-10-CM | POA: Diagnosis present

## 2013-08-25 DIAGNOSIS — R079 Chest pain, unspecified: Secondary | ICD-10-CM

## 2013-08-25 DIAGNOSIS — F411 Generalized anxiety disorder: Secondary | ICD-10-CM

## 2013-08-25 DIAGNOSIS — R55 Syncope and collapse: Secondary | ICD-10-CM

## 2013-08-25 DIAGNOSIS — E119 Type 2 diabetes mellitus without complications: Secondary | ICD-10-CM

## 2013-08-25 LAB — GLUCOSE, CAPILLARY
GLUCOSE-CAPILLARY: 144 mg/dL — AB (ref 70–99)
GLUCOSE-CAPILLARY: 146 mg/dL — AB (ref 70–99)
Glucose-Capillary: 117 mg/dL — ABNORMAL HIGH (ref 70–99)
Glucose-Capillary: 115 mg/dL — ABNORMAL HIGH (ref 70–99)
Glucose-Capillary: 147 mg/dL — ABNORMAL HIGH (ref 70–99)

## 2013-08-25 LAB — URINALYSIS, ROUTINE W REFLEX MICROSCOPIC
BILIRUBIN URINE: NEGATIVE
Glucose, UA: NEGATIVE mg/dL
HGB URINE DIPSTICK: NEGATIVE
KETONES UR: NEGATIVE mg/dL
NITRITE: NEGATIVE
PROTEIN: NEGATIVE mg/dL
Specific Gravity, Urine: 1.022 (ref 1.005–1.030)
UROBILINOGEN UA: 0.2 mg/dL (ref 0.0–1.0)
pH: 5 (ref 5.0–8.0)

## 2013-08-25 LAB — URINE MICROSCOPIC-ADD ON

## 2013-08-25 LAB — MRSA PCR SCREENING: MRSA by PCR: POSITIVE — AB

## 2013-08-25 LAB — TROPONIN I

## 2013-08-25 LAB — PRO B NATRIURETIC PEPTIDE: Pro B Natriuretic peptide (BNP): 23 pg/mL (ref 0–125)

## 2013-08-25 MED ORDER — BIOTENE DRY MOUTH MT LIQD
15.0000 mL | Freq: Two times a day (BID) | OROMUCOSAL | Status: DC
  Administered 2013-08-25 – 2013-08-30 (×11): 15 mL via OROMUCOSAL

## 2013-08-25 MED ORDER — PANTOPRAZOLE SODIUM 40 MG PO TBEC
40.0000 mg | DELAYED_RELEASE_TABLET | Freq: Every day | ORAL | Status: DC
  Administered 2013-08-25 – 2013-08-30 (×6): 40 mg via ORAL
  Filled 2013-08-25 (×5): qty 1

## 2013-08-25 MED ORDER — AMITRIPTYLINE HCL 100 MG PO TABS
200.0000 mg | ORAL_TABLET | Freq: Every evening | ORAL | Status: DC
  Administered 2013-08-25 – 2013-08-30 (×6): 200 mg via ORAL
  Filled 2013-08-25 (×6): qty 2

## 2013-08-25 MED ORDER — LORATADINE 10 MG PO TABS
10.0000 mg | ORAL_TABLET | Freq: Every day | ORAL | Status: DC
  Administered 2013-08-25 – 2013-08-29 (×5): 10 mg via ORAL
  Filled 2013-08-25 (×6): qty 1

## 2013-08-25 MED ORDER — TIOTROPIUM BROMIDE MONOHYDRATE 18 MCG IN CAPS
18.0000 ug | ORAL_CAPSULE | Freq: Every day | RESPIRATORY_TRACT | Status: DC
  Administered 2013-08-25 – 2013-08-30 (×5): 18 ug via RESPIRATORY_TRACT
  Filled 2013-08-25 (×2): qty 5

## 2013-08-25 MED ORDER — ONDANSETRON HCL 4 MG PO TABS: 4.0000 mg | ORAL_TABLET | Freq: Four times a day (QID) | ORAL | Status: DC | PRN

## 2013-08-25 MED ORDER — NITROGLYCERIN 0.4 MG SL SUBL
0.4000 mg | SUBLINGUAL_TABLET | SUBLINGUAL | Status: DC | PRN
  Administered 2013-08-25: 0.4 mg via SUBLINGUAL

## 2013-08-25 MED ORDER — HYDROCODONE-ACETAMINOPHEN 5-325 MG PO TABS
1.0000 | ORAL_TABLET | ORAL | Status: DC | PRN
  Administered 2013-08-26 – 2013-08-30 (×7): 1 via ORAL
  Filled 2013-08-25 (×7): qty 1

## 2013-08-25 MED ORDER — GABAPENTIN 600 MG PO TABS
1200.0000 mg | ORAL_TABLET | Freq: Every day | ORAL | Status: DC
  Administered 2013-08-25 – 2013-08-30 (×6): 1200 mg via ORAL
  Filled 2013-08-25 (×6): qty 2

## 2013-08-25 MED ORDER — ENOXAPARIN SODIUM 40 MG/0.4ML ~~LOC~~ SOLN
40.0000 mg | SUBCUTANEOUS | Status: DC
  Administered 2013-08-25 – 2013-08-29 (×4): 40 mg via SUBCUTANEOUS
  Filled 2013-08-25 (×5): qty 0.4

## 2013-08-25 MED ORDER — CARVEDILOL 12.5 MG PO TABS
12.5000 mg | ORAL_TABLET | Freq: Two times a day (BID) | ORAL | Status: DC
  Administered 2013-08-25 – 2013-08-30 (×12): 12.5 mg via ORAL
  Filled 2013-08-25 (×13): qty 1

## 2013-08-25 MED ORDER — ACETAMINOPHEN 650 MG RE SUPP: 650.0000 mg | Freq: Four times a day (QID) | RECTAL | Status: DC | PRN

## 2013-08-25 MED ORDER — HYDROMORPHONE HCL PF 1 MG/ML IJ SOLN
1.0000 mg | INTRAMUSCULAR | Status: DC | PRN
  Administered 2013-08-25 – 2013-08-27 (×5): 1 mg via INTRAVENOUS
  Filled 2013-08-25 (×5): qty 1

## 2013-08-25 MED ORDER — ASPIRIN EC 325 MG PO TBEC
325.0000 mg | DELAYED_RELEASE_TABLET | Freq: Every day | ORAL | Status: DC
  Administered 2013-08-25 – 2013-08-30 (×6): 325 mg via ORAL
  Filled 2013-08-25 (×6): qty 1

## 2013-08-25 MED ORDER — GABAPENTIN 400 MG PO CAPS
1800.0000 mg | ORAL_CAPSULE | Freq: Every day | ORAL | Status: DC
  Administered 2013-08-25 – 2013-08-27 (×3): 1800 mg via ORAL
  Filled 2013-08-25 (×5): qty 2

## 2013-08-25 MED ORDER — DIAZEPAM 5 MG PO TABS: 10.0000 mg | ORAL_TABLET | Freq: Three times a day (TID) | ORAL | Status: DC | PRN

## 2013-08-25 MED ORDER — TRIAMCINOLONE ACETONIDE 55 MCG/ACT NA AERO
2.0000 | INHALATION_SPRAY | Freq: Every day | NASAL | Status: DC
  Administered 2013-08-25 – 2013-08-29 (×4): 2 via NASAL
  Filled 2013-08-25 (×2): qty 21.6

## 2013-08-25 MED ORDER — GLIPIZIDE 5 MG PO TABS
5.0000 mg | ORAL_TABLET | Freq: Two times a day (BID) | ORAL | Status: DC
  Filled 2013-08-25 (×3): qty 1

## 2013-08-25 MED ORDER — IOHEXOL 350 MG/ML SOLN
100.0000 mL | Freq: Once | INTRAVENOUS | Status: AC | PRN
  Administered 2013-08-25: 100 mL via INTRAVENOUS

## 2013-08-25 MED ORDER — ONDANSETRON HCL 4 MG/2ML IJ SOLN: 4.0000 mg | Freq: Four times a day (QID) | INTRAMUSCULAR | Status: DC | PRN

## 2013-08-25 MED ORDER — ASPIRIN EC 81 MG PO TBEC
81.0000 mg | DELAYED_RELEASE_TABLET | Freq: Every day | ORAL | Status: DC
  Filled 2013-08-25: qty 1

## 2013-08-25 MED ORDER — REGADENOSON 0.4 MG/5ML IV SOLN
INTRAVENOUS | Status: AC
  Administered 2013-08-25: 0.4 mg
  Filled 2013-08-25: qty 5

## 2013-08-25 MED ORDER — HYDROCODONE-ACETAMINOPHEN 5-325 MG PO TABS
2.0000 | ORAL_TABLET | Freq: Every evening | ORAL | Status: DC | PRN
  Administered 2013-08-25: 2 via ORAL
  Filled 2013-08-25: qty 2

## 2013-08-25 MED ORDER — MUPIROCIN 2 % EX OINT
1.0000 "application " | TOPICAL_OINTMENT | Freq: Two times a day (BID) | CUTANEOUS | Status: AC
  Administered 2013-08-25 – 2013-08-29 (×9): 1 via NASAL
  Filled 2013-08-25 (×2): qty 22

## 2013-08-25 MED ORDER — NITROGLYCERIN 0.4 MG SL SUBL
SUBLINGUAL_TABLET | SUBLINGUAL | Status: AC
  Filled 2013-08-25: qty 1

## 2013-08-25 MED ORDER — SERTRALINE HCL 100 MG PO TABS
200.0000 mg | ORAL_TABLET | Freq: Every day | ORAL | Status: DC
  Administered 2013-08-25 – 2013-08-29 (×5): 200 mg via ORAL
  Filled 2013-08-25 (×6): qty 2

## 2013-08-25 MED ORDER — INSULIN ASPART 100 UNIT/ML ~~LOC~~ SOLN
0.0000 [IU] | Freq: Three times a day (TID) | SUBCUTANEOUS | Status: DC
  Administered 2013-08-25 (×2): 1 [IU] via SUBCUTANEOUS
  Administered 2013-08-26: 2 [IU] via SUBCUTANEOUS
  Administered 2013-08-27 – 2013-08-28 (×4): 1 [IU] via SUBCUTANEOUS
  Administered 2013-08-28 – 2013-08-29 (×2): 2 [IU] via SUBCUTANEOUS
  Administered 2013-08-29: 1 [IU] via SUBCUTANEOUS
  Administered 2013-08-29 – 2013-08-30 (×3): 2 [IU] via SUBCUTANEOUS

## 2013-08-25 MED ORDER — ACETAMINOPHEN 325 MG PO TABS: 650.0000 mg | ORAL_TABLET | Freq: Four times a day (QID) | ORAL | Status: DC | PRN

## 2013-08-25 MED ORDER — SODIUM CHLORIDE 0.9 % IJ SOLN
3.0000 mL | Freq: Two times a day (BID) | INTRAMUSCULAR | Status: DC
Start: 2013-08-25 — End: 2013-08-30
  Administered 2013-08-25 – 2013-08-29 (×7): 3 mL via INTRAVENOUS

## 2013-08-25 MED ORDER — BUDESONIDE-FORMOTEROL FUMARATE 160-4.5 MCG/ACT IN AERO
2.0000 | INHALATION_SPRAY | Freq: Two times a day (BID) | RESPIRATORY_TRACT | Status: DC
  Administered 2013-08-25 – 2013-08-30 (×11): 2 via RESPIRATORY_TRACT
  Filled 2013-08-25: qty 6

## 2013-08-25 MED ORDER — HYDROCHLOROTHIAZIDE 25 MG PO TABS
25.0000 mg | ORAL_TABLET | Freq: Every day | ORAL | Status: DC
  Administered 2013-08-25 – 2013-08-29 (×5): 25 mg via ORAL
  Filled 2013-08-25 (×6): qty 1

## 2013-08-25 MED ORDER — TECHNETIUM TC 99M SESTAMIBI GENERIC - CARDIOLITE
30.0000 | Freq: Once | INTRAVENOUS | Status: AC | PRN
  Administered 2013-08-25: 30 via INTRAVENOUS

## 2013-08-25 MED ORDER — CHLORHEXIDINE GLUCONATE CLOTH 2 % EX PADS
6.0000 | MEDICATED_PAD | Freq: Every day | CUTANEOUS | Status: AC
  Administered 2013-08-25 – 2013-08-28 (×4): 6 via TOPICAL

## 2013-08-25 MED ORDER — SODIUM CHLORIDE 0.9 % IJ SOLN
3.0000 mL | Freq: Two times a day (BID) | INTRAMUSCULAR | Status: DC
  Administered 2013-08-27 – 2013-08-30 (×5): 3 mL via INTRAVENOUS

## 2013-08-25 MED ORDER — LISINOPRIL 20 MG PO TABS
20.0000 mg | ORAL_TABLET | Freq: Every day | ORAL | Status: DC
  Administered 2013-08-25 – 2013-08-30 (×6): 20 mg via ORAL
  Filled 2013-08-25 (×6): qty 1

## 2013-08-25 MED ORDER — PROMETHAZINE HCL 25 MG PO TABS: 25.0000 mg | ORAL_TABLET | Freq: Four times a day (QID) | ORAL | Status: DC | PRN

## 2013-08-25 NOTE — Progress Notes (Signed)
UR completed 

## 2013-08-25 NOTE — Consult Note (Signed)
Referring Physician: Rai    Chief Complaint: right arm and leg weakness  HPI:                                                                                                                                         Denise Davis is an 48 y.o. female who notes yesterday "as some time" during the day she suddenly could not move her right arm or leg .  She states in order to move her right arm she need to use her left arm to pick it up. She endorses pain in her shoulder.  She also states she is unable to lift her right leg without use of her arms.  Patient was sent for MRI of brain while in hospital. Results showed no acute infarct.  Currently she is still stating she is unable to lift her arm.     Date last known well: Date: 08/24/2013 Time last known well: Unable to determine tPA Given: No: out of window  Past Medical History  Diagnosis Date  . Diabetes mellitus   . Hypertension   . DVT of leg (deep venous thrombosis) 12/2007    left leg, given warfarin for 6 mos  . Morbid obesity   . Renal cell cancer 2009    S/p nephrectomy (left)  . Pericarditis     Diagnosed/treated at [facility]; per patient 4 episodes per cardiologist Dr. Edwin Dada persistant chest pain unclear if pericarditis verusus neuropsychogenic   . H/O hiatal hernia   . Anxiety   . Shortness of breath     Past Surgical History  Procedure Laterality Date  . Endometrial ablation  2007  . Tubal ligation    . Insertion of mesh  2014  . Umbilical hernia repair      Family History  Problem Relation Age of Onset  . Hypertension Mother   . Breast cancer Mother   . Heart disease Paternal Grandmother    Social History:  reports that she has quit smoking. She has never used smokeless tobacco. She reports that she does not drink alcohol or use illicit drugs.  Allergies:  Allergies  Allergen Reactions  . Milk-Related Compounds     Diarrhea and vomiting  "only milk causes problems. Milk products are ok"    Medications:  Prior to Admission:  Prescriptions prior to admission  Medication Sig Dispense Refill  . amitriptyline (ELAVIL) 50 MG tablet Take 200 mg by mouth every evening. Take 2-3 hours prior to bedtime      . aspirin EC 81 MG tablet Take 81 mg by mouth daily.      . budesonide-formoterol (SYMBICORT) 160-4.5 MCG/ACT inhaler Inhale 2 puffs into the lungs 2 (two) times daily.      . carvedilol (COREG) 12.5 MG tablet Take 12.5 mg by mouth 2 (two) times daily with a meal.      . diazepam (VALIUM) 10 MG tablet Take 10 mg by mouth 3 (three) times daily as needed for anxiety.      Marland Kitchen esomeprazole (NEXIUM) 40 MG capsule Take 40 mg by mouth daily.       Marland Kitchen gabapentin (NEURONTIN) 300 MG capsule Take 1,800 mg by mouth at bedtime.      . gabapentin (NEURONTIN) 600 MG tablet Take 1,200 mg by mouth daily.       Marland Kitchen glipiZIDE (GLUCOTROL) 5 MG tablet Take 5 mg by mouth 2 (two) times daily before a meal.       . hydrochlorothiazide (HYDRODIURIL) 25 MG tablet Take 25 mg by mouth daily.      Marland Kitchen HYDROcodone-acetaminophen (NORCO/VICODIN) 5-325 MG per tablet Take 2 tablets by mouth at bedtime as needed for moderate pain.      Marland Kitchen ibuprofen (ADVIL,MOTRIN) 800 MG tablet Take 800 mg by mouth every 4 (four) hours as needed (pain).      Marland Kitchen lisinopril (PRINIVIL,ZESTRIL) 20 MG tablet Take 20 mg by mouth daily.      Marland Kitchen loratadine (CLARITIN) 10 MG tablet Take 10 mg by mouth at bedtime.      . metFORMIN (GLUCOPHAGE) 1000 MG tablet Take 1,000 mg by mouth 2 (two) times daily with a meal.      . nitroGLYCERIN (NITROSTAT) 0.4 MG SL tablet Place 0.4 mg under the tongue every 5 (five) minutes as needed for chest pain. (may repeat every 5 minutes but seek medical help if pain persists after 3 tablets)      . promethazine (PHENERGAN) 25 MG tablet Take 25 mg by mouth 4 (four) times daily as needed for nausea or vomiting.      . sertraline  (ZOLOFT) 100 MG tablet Take 200 mg by mouth at bedtime.       Marland Kitchen tiotropium (SPIRIVA) 18 MCG inhalation capsule Place 18 mcg into inhaler and inhale daily.      Marland Kitchen triamcinolone (NASACORT AQ) 55 MCG/ACT AERO nasal inhaler Place 2 sprays into both nostrils daily.        Scheduled: . amitriptyline  200 mg Oral QPM  . antiseptic oral rinse  15 mL Mouth Rinse BID  . aspirin EC  325 mg Oral Daily  . budesonide-formoterol  2 puff Inhalation BID  . carvedilol  12.5 mg Oral BID WC  . Chlorhexidine Gluconate Cloth  6 each Topical Q0600  . enoxaparin (LOVENOX) injection  40 mg Subcutaneous Q24H  . gabapentin  1,800 mg Oral QHS  . gabapentin  1,200 mg Oral Daily  . hydrochlorothiazide  25 mg Oral Daily  . insulin aspart  0-9 Units Subcutaneous TID WC  . lisinopril  20 mg Oral Daily  . loratadine  10 mg Oral QHS  . mupirocin ointment  1 application Nasal BID  . pantoprazole  40 mg Oral Daily  . sertraline  200 mg Oral QHS  . sodium chloride  3  mL Intravenous Q12H  . sodium chloride  3 mL Intravenous Q12H  . tiotropium  18 mcg Inhalation Daily  . triamcinolone  2 spray Each Nare Daily    ROS:                                                                                                                                       History obtained from the patient  General ROS: negative for - chills, fatigue, fever, night sweats, weight gain or weight loss Psychological ROS: negative for - behavioral disorder, hallucinations, memory difficulties, mood swings or suicidal ideation Ophthalmic ROS: negative for - blurry vision, double vision, eye pain or loss of vision [name] ROS: negative for - epistaxis, nasal discharge, oral lesions, sore throat, tinnitus or vertigo Allergy and Immunology ROS: negative for - hives or itchy/watery eyes Hematological and Lymphatic ROS: negative for - bleeding problems, bruising or swollen lymph nodes Endocrine ROS: negative for - galactorrhea, hair pattern changes,  polydipsia/polyuria or temperature intolerance Respiratory ROS: negative for - cough, hemoptysis, shortness of breath or wheezing Cardiovascular ROS: negative for - chest pain, dyspnea on exertion, edema or irregular heartbeat Gastrointestinal ROS: negative for - abdominal pain, diarrhea, hematemesis, nausea/vomiting or stool incontinence Genito-Urinary ROS: negative for - dysuria, hematuria, incontinence or urinary frequency/urgency Musculoskeletal ROS: negative for - joint swelling or muscular weakness Neurological ROS: as noted in HPI Dermatological ROS: negative for rash and skin lesion changes  Neurologic Examination:                                                                                                      Blood pressure 104/53, pulse 88, temperature 97.8 F (36.6 C), temperature source Oral, resp. rate 22, height 5\' 5"  (1.651 m), weight 171.868 kg (378 lb 14.4 oz), SpO2 100.00%.   Mental Status: Alert, oriented, thought content appropriate.  Speech fluent without evidence of aphasia.  Able to follow 3 step commands without difficulty. Cranial Nerves: II: Discs flat bilaterally; Visual fields grossly normal, pupils equal, round, reactive to light and accommodation III,IV, VI: ptosis not present, extra-ocular motions intact bilaterally V,VII: smile symmetric, facial light touch sensation normal bilaterally VIII: hearing normal bilaterally IX,X: gag reflex present XI: bilateral shoulder shrug XII: midline tongue extension without atrophy or fasciculations  Motor: Able to move her left arm and leg with 5/5 strength.  When asked to lift her right arm she stated she was unable to lift off the bed--when asked to give resistance she showed significant  give way strength, when asked to touch her nose she did so with no trouble, later in exam she used her right arm to lift her right leg off the bed.  Right leg exam showed significant give way weakness throughout but withdrew from  pain with 5/5 strength. Positive hoover's sign.  Sensory: Pinprick and light touch intact throughout, bilaterally Deep Tendon Reflexes:  Right: Upper Extremity   Left: Upper extremity   biceps (C-5 to C-6) 2/4   biceps (C-5 to C-6) 2/4 tricep (C7) 2/4    triceps (C7) 2/4 Brachioradialis (C6) 2/4  Brachioradialis (C6) 2/4  Lower Extremity Lower Extremity  quadriceps (L-2 to L-4) 2/4   quadriceps (L-2 to L-4) 2/4 Achilles (S1) 0/4   Achilles (S1) 0/4  Plantars: Right: downgoing   Left: downgoing Cerebellar: normal finger-to-nose,  Unable to do heel-to-shin test due to body habitus Gait: not tested due to patient saftey CV: pulses palpable throughout    Lab Results: Basic Metabolic Panel:  Recent Labs Lab 08/24/13 2229  NA 136*  K 4.4  CL 98  CO2 24  GLUCOSE 176*  BUN 14  CREATININE 0.88  CALCIUM 9.0    Liver Function Tests:  Recent Labs Lab 08/24/13 2229  AST 15  ALT 15  ALKPHOS 88  BILITOT <0.2*  PROT 7.6  ALBUMIN 3.3*   No results found for this basename: LIPASE, AMYLASE,  in the last 168 hours No results found for this basename: AMMONIA,  in the last 168 hours  CBC:  Recent Labs Lab 08/24/13 2229  WBC 5.6  NEUTROABS 2.8  HGB 10.5*  HCT 32.4*  MCV 88.8  PLT 277    Cardiac Enzymes:  Recent Labs Lab 08/25/13 1130  TROPONINI <0.30    Lipid Panel: No results found for this basename: CHOL, TRIG, HDL, CHOLHDL, VLDL, LDLCALC,  in the last 168 hours  CBG:  Recent Labs Lab 08/24/13 2113 08/25/13 0402 08/25/13 0752 08/25/13 1145  GLUCAP 128* 117* 144* 115*    Microbiology: Results for orders placed during the hospital encounter of 08/24/13  MRSA PCR SCREENING     Status: Abnormal   Collection Time    08/25/13  4:42 AM      Result Value Ref Range Status   MRSA by PCR POSITIVE (*) NEGATIVE Final   Comment:            The GeneXpert MRSA Assay (FDA     approved for NASAL specimens     only), is one component of a     comprehensive  MRSA colonization     surveillance program. It is not     intended to diagnose MRSA     infection nor to guide or     monitor treatment for     MRSA infections.     RESULT CALLED TO, READ BACK BY AND VERIFIED WITH:     T.DORMON RN 1025 08/25/13 E.GADDY    Coagulation Studies: No results found for this basename: LABPROT, INR,  in the last 72 hours  Imaging: Dg Chest 2 View  08/24/2013   CLINICAL DATA:  Mid to RIGHT chest pain, shortness of breath, history hypertension, diabetes, renal cell carcinoma  EXAM: CHEST  2 VIEW  COMPARISON:  08/16/2012  FINDINGS: Enlargement of cardiac silhouette.  Slight pulmonary vascular congestion.  Mediastinal contours normal.  No acute infiltrate, pleural effusion, or pneumothorax.  Bones unremarkable.  IMPRESSION: Enlargement of cardiac silhouette with slight pulmonary vascular congestion.  No acute abnormalities.  Electronically Signed   By: Lavonia Dana M.D.   On: 08/24/2013 23:11   Ct Head Wo Contrast  08/25/2013   CLINICAL DATA:  Loss of consciousness.  Right-sided weakness.  EXAM: CT HEAD WITHOUT CONTRAST  TECHNIQUE: Contiguous axial images were obtained from the base of the skull through the vertex without intravenous contrast.  COMPARISON:  None.  FINDINGS: There is no evidence of acute infarction, mass lesion, or intra- or extra-axial hemorrhage on CT.  The posterior fossa, including the cerebellum, brainstem and fourth ventricle, is within normal limits. The third and lateral ventricles, and basal ganglia are unremarkable in appearance. The cerebral hemispheres are symmetric in appearance, with normal gray-white differentiation. No mass effect or midline shift is seen.  There is no evidence of fracture; visualized osseous structures are unremarkable in appearance. The orbits are within normal limits. The paranasal sinuses and mastoid air cells are well-aerated. No significant soft tissue abnormalities are seen.  IMPRESSION: Unremarkable noncontrast CT of the  head.   Electronically Signed   By: Garald Balding M.D.   On: 08/25/2013 00:40   Ct Angio Chest Pe W/cm &/or Wo Cm  08/25/2013   CLINICAL DATA:  Shortness of breath; syncope. Concern for pulmonary embolus.  EXAM: CT ANGIOGRAPHY CHEST WITH CONTRAST  TECHNIQUE: Multidetector CT imaging of the chest was performed using the standard protocol during bolus administration of intravenous contrast. Multiplanar CT image reconstructions and MIPs were obtained to evaluate the vascular anatomy.  CONTRAST:  14mL OMNIPAQUE IOHEXOL 350 MG/ML SOLN  COMPARISON:  CTA of the chest performed 05/09/2011, and chest radiograph performed 08/24/2013  FINDINGS: There is no evidence of pulmonary embolus.  Mild bilateral atelectasis is noted. The lungs are otherwise clear. There is no evidence of significant focal consolidation, pleural effusion or pneumothorax. No masses are identified; no abnormal focal contrast enhancement is seen.  The mediastinum is unremarkable in appearance. No mediastinal lymphadenopathy is seen. No pericardial effusion is identified. The great vessels are grossly unremarkable in appearance. No axillary lymphadenopathy is seen. The visualized portions of the thyroid gland are unremarkable in appearance.  The visualized portions of the liver and spleen are unremarkable.  No acute osseous abnormalities are seen.  Review of the MIP images confirms the above findings.  IMPRESSION: 1. No evidence of pulmonary embolus. 2. Mild bilateral atelectasis noted; lungs otherwise clear.   Electronically Signed   By: Garald Balding M.D.   On: 08/25/2013 01:53     Etta Quill PA-C [facility] [phone]  08/25/2013, 4:26 PM   Assessment: 48 y.o. female with complaints of right arm and leg weakness in setting of normal MRI brain. Exam shows multiple inconstancies and significant give way weakness. Given multiple functional findings on exam and normal MRI, psychophysiologic factors are strongly suspected.    Stroke Risk Factors - diabetes mellitus and hypertension   Recommendations: 1. No further neurodiagnostic studies are indicated nor further neurological medical input. 2. Physical therapy consult.  I personally participated: in this patient's evaluation and management, including formulating the above clinical impression and management recommendations.  Rush Farmer M.D. [facility] [phone]

## 2013-08-25 NOTE — H&P (Signed)
[facility] History and Physical  Denise Davis YBO:[id number] DOB: 06/05/1965 DOA: 08/24/2013  Referring physician: ER physician. PCP: Marguerita Merles, MD   Chief Complaint: Loss of consciousness.  HPI: Denise Davis is a 48 y.o. female with history of diabetes mellitus, hypertension, renal cell cancer status post nephrectomy, COPD on home oxygen was brought to the ER patient had loss of consciousness. Per patient's husband patient was on a truck when patient became unresponsive. They do not know how long she was unresponsive because when they reached the destination and tried to wake up the patient she was not waking up. They called the EMS and once EMS kept on oxygen patient woke up. Patient does not recall the incident. In the ER patient was complaining of some chest pain and headache. CT head and angiogram of the chest was negative for anything acute. EKG shows sinus rhythm with PVCs and cardiac markers are negative. Patient on exam is nonfocal and has been admitted for further management. Patient is usually wheelchair-bound. Patient denies any nausea vomiting abdominal pain diarrhea. Husband did not notice any seizure-like activities.  Review of Systems: As presented in the history of presenting illness, rest negative.  Past Medical History  Diagnosis Date  . Diabetes mellitus   . Hypertension   . DVT of leg (deep venous thrombosis) 12/2007    left leg, given warfarin for 6 mos  . Morbid obesity   . Renal cell cancer 2009    S/p nephrectomy (left)  . Pericarditis     Diagnosed/treated at [facility]; per patient 4 episodes per cardiologist Dr. Edwin Dada persistant chest pain unclear if pericarditis verusus neuropsychogenic   . H/O hiatal hernia   . Anxiety   . Shortness of breath    Past Surgical History  Procedure Laterality Date  . Endometrial ablation  2007  . Tubal ligation    . Insertion of mesh  2014  . Umbilical hernia repair     Social History:  reports that she has  quit smoking. She has never used smokeless tobacco. She reports that she does not drink alcohol or use illicit drugs. Where does patient live home. Can patient participate in ADLs? Yes.  Allergies  Allergen Reactions  . Milk-Related Compounds     Diarrhea and vomiting  "only milk causes problems. Milk products are ok"    Family History:  Family History  Problem Relation Age of Onset  . Hypertension Mother   . Breast cancer Mother   . Heart disease Paternal Grandmother       Prior to Admission medications   Medication Sig Start Date End Date Taking? Authorizing Provider  amitriptyline (ELAVIL) 50 MG tablet Take 200 mg by mouth every evening. Take 2-3 hours prior to bedtime   Yes Historical Provider, MD  aspirin EC 81 MG tablet Take 81 mg by mouth daily.   Yes Historical Provider, MD  budesonide-formoterol (SYMBICORT) 160-4.5 MCG/ACT inhaler Inhale 2 puffs into the lungs 2 (two) times daily.   Yes Historical Provider, MD  carvedilol (COREG) 12.5 MG tablet Take 12.5 mg by mouth 2 (two) times daily with a meal.   Yes Historical Provider, MD  diazepam (VALIUM) 10 MG tablet Take 10 mg by mouth 3 (three) times daily as needed for anxiety.   Yes Historical Provider, MD  esomeprazole (NEXIUM) 40 MG capsule Take 40 mg by mouth daily.    Yes Historical Provider, MD  gabapentin (NEURONTIN) 300 MG capsule Take 1,800 mg by mouth at bedtime.  Yes Historical Provider, MD  gabapentin (NEURONTIN) 600 MG tablet Take 1,200 mg by mouth daily.    Yes Historical Provider, MD  glipiZIDE (GLUCOTROL) 5 MG tablet Take 5 mg by mouth 2 (two) times daily before a meal.    Yes Historical Provider, MD  hydrochlorothiazide (HYDRODIURIL) 25 MG tablet Take 25 mg by mouth daily.   Yes Historical Provider, MD  HYDROcodone-acetaminophen (NORCO/VICODIN) 5-325 MG per tablet Take 2 tablets by mouth at bedtime as needed for moderate pain.   Yes Historical Provider, MD  ibuprofen (ADVIL,MOTRIN) 800 MG tablet Take 800 mg by  mouth every 4 (four) hours as needed (pain).   Yes Historical Provider, MD  lisinopril (PRINIVIL,ZESTRIL) 20 MG tablet Take 20 mg by mouth daily.   Yes Historical Provider, MD  loratadine (CLARITIN) 10 MG tablet Take 10 mg by mouth at bedtime.   Yes Historical Provider, MD  metFORMIN (GLUCOPHAGE) 1000 MG tablet Take 1,000 mg by mouth 2 (two) times daily with a meal.   Yes Historical Provider, MD  nitroGLYCERIN (NITROSTAT) 0.4 MG SL tablet Place 0.4 mg under the tongue every 5 (five) minutes as needed for chest pain. (may repeat every 5 minutes but seek medical help if pain persists after 3 tablets) 07/06/09  Yes Historical Provider, MD  promethazine (PHENERGAN) 25 MG tablet Take 25 mg by mouth 4 (four) times daily as needed for nausea or vomiting.   Yes Historical Provider, MD  sertraline (ZOLOFT) 100 MG tablet Take 200 mg by mouth at bedtime.    Yes Historical Provider, MD  tiotropium (SPIRIVA) 18 MCG inhalation capsule Place 18 mcg into inhaler and inhale daily.   Yes Historical Provider, MD  triamcinolone (NASACORT AQ) 55 MCG/ACT AERO nasal inhaler Place 2 sprays into both nostrils daily.    Yes Historical Provider, MD    Physical Exam: Filed Vitals:   08/25/13 0156 08/25/13 0200 08/25/13 0300 08/25/13 0404  BP: 131/81 125/81 118/81 124/68  Pulse: 94 93 97 104  Temp:   98.3 F (36.8 C) 97.8 F (36.6 C)  TempSrc:   Oral Oral  Resp: 14 20 24 22   Height:    5\' 5"  (1.651 m)  Weight:    171.868 kg (378 lb 14.4 oz)  SpO2: 100% 100% 100% 100%     General:  Morbidly obese not in distress.  Eyes: Anicteric no pallor.  [name]: No discharge from the ears eyes nose mouth.  Neck: No mass felt.  Cardiovascular: S1-S2 heard.  Respiratory: No rhonchi or crepitations.  Abdomen: Soft nontender bowel sounds present. No guarding rigidity.  Skin: No rash.  Musculoskeletal: No edema.  Psychiatric: Appears normal.  Neurologic: Alert awake oriented to time place and person. Moves all  extremities.  Labs on Admission:  Basic Metabolic Panel:  Recent Labs Lab 08/24/13 2229  NA 136*  K 4.4  CL 98  CO2 24  GLUCOSE 176*  BUN 14  CREATININE 0.88  CALCIUM 9.0   Liver Function Tests:  Recent Labs Lab 08/24/13 2229  AST 15  ALT 15  ALKPHOS 88  BILITOT <0.2*  PROT 7.6  ALBUMIN 3.3*   No results found for this basename: LIPASE, AMYLASE,  in the last 168 hours No results found for this basename: AMMONIA,  in the last 168 hours CBC:  Recent Labs Lab 08/24/13 2229  WBC 5.6  NEUTROABS 2.8  HGB 10.5*  HCT 32.4*  MCV 88.8  PLT 277   Cardiac Enzymes: No results found for this basename: CKTOTAL, CKMB,  CKMBINDEX, TROPONINI,  in the last 168 hours  BNP (last 3 results) No results found for this basename: PROBNP,  in the last 8760 hours CBG:  Recent Labs Lab 08/24/13 2113 08/25/13 0402  GLUCAP 128* 117*    Radiological Exams on Admission: Dg Chest 2 View  08/24/2013   CLINICAL DATA:  Mid to RIGHT chest pain, shortness of breath, history hypertension, diabetes, renal cell carcinoma  EXAM: CHEST  2 VIEW  COMPARISON:  08/16/2012  FINDINGS: Enlargement of cardiac silhouette.  Slight pulmonary vascular congestion.  Mediastinal contours normal.  No acute infiltrate, pleural effusion, or pneumothorax.  Bones unremarkable.  IMPRESSION: Enlargement of cardiac silhouette with slight pulmonary vascular congestion.  No acute abnormalities.   Electronically Signed   By: Lavonia Dana M.D.   On: 08/24/2013 23:11   Ct Head Wo Contrast  08/25/2013   CLINICAL DATA:  Loss of consciousness.  Right-sided weakness.  EXAM: CT HEAD WITHOUT CONTRAST  TECHNIQUE: Contiguous axial images were obtained from the base of the skull through the vertex without intravenous contrast.  COMPARISON:  None.  FINDINGS: There is no evidence of acute infarction, mass lesion, or intra- or extra-axial hemorrhage on CT.  The posterior fossa, including the cerebellum, brainstem and fourth ventricle, is  within normal limits. The third and lateral ventricles, and basal ganglia are unremarkable in appearance. The cerebral hemispheres are symmetric in appearance, with normal gray-white differentiation. No mass effect or midline shift is seen.  There is no evidence of fracture; visualized osseous structures are unremarkable in appearance. The orbits are within normal limits. The paranasal sinuses and mastoid air cells are well-aerated. No significant soft tissue abnormalities are seen.  IMPRESSION: Unremarkable noncontrast CT of the head.   Electronically Signed   By: Garald Balding M.D.   On: 08/25/2013 00:40   Ct Angio Chest Pe W/cm &/or Wo Cm  08/25/2013   CLINICAL DATA:  Shortness of breath; syncope. Concern for pulmonary embolus.  EXAM: CT ANGIOGRAPHY CHEST WITH CONTRAST  TECHNIQUE: Multidetector CT imaging of the chest was performed using the standard protocol during bolus administration of intravenous contrast. Multiplanar CT image reconstructions and MIPs were obtained to evaluate the vascular anatomy.  CONTRAST:  131mL OMNIPAQUE IOHEXOL 350 MG/ML SOLN  COMPARISON:  CTA of the chest performed 05/09/2011, and chest radiograph performed 08/24/2013  FINDINGS: There is no evidence of pulmonary embolus.  Mild bilateral atelectasis is noted. The lungs are otherwise clear. There is no evidence of significant focal consolidation, pleural effusion or pneumothorax. No masses are identified; no abnormal focal contrast enhancement is seen.  The mediastinum is unremarkable in appearance. No mediastinal lymphadenopathy is seen. No pericardial effusion is identified. The great vessels are grossly unremarkable in appearance. No axillary lymphadenopathy is seen. The visualized portions of the thyroid gland are unremarkable in appearance.  The visualized portions of the liver and spleen are unremarkable.  No acute osseous abnormalities are seen.  Review of the MIP images confirms the above findings.  IMPRESSION: 1. No  evidence of pulmonary embolus. 2. Mild bilateral atelectasis noted; lungs otherwise clear.   Electronically Signed   By: Garald Balding M.D.   On: 08/25/2013 01:53    EKG: Independently reviewed. No sinus rhythm with PVCs.  Assessment/Plan Active Problems:   Diabetes mellitus   Hypertension   Syncope   1. Syncope - cause not clear. Observe in telemetry for any arrhythmias. Check 2-D echo. Orthostatic blood pressure. Closely follow CBGs. 2. Chest pain - patient's husband states that  she has chronic chest pain. Chest pain is pleuritic in nature. Check 2-D echo. Cycle markers. 3. Diabetes mellitus - hold metformin as patient received contrast. Closely follow CBGs. 4. Hypertension - continue present medications. Check orthostatics. 5. COPD on home oxygen - presently not wheezing. 6. History of pain also carcinoma status post nephrectomy. 7. Anemia - follow CBC.    Code Status: Full code.  Family Communication: Patient's husband.  Disposition Plan: Admit for observation.    [facility] Pager [phone].  If 7PM-7AM, please contact night-coverage [email or web address] Password [facility] 08/25/2013, 4:43 AM

## 2013-08-25 NOTE — ED Notes (Signed)
Pt ambulated without difficulty to the bathroom and back to the room. Pt able to provide urine sample at this time.

## 2013-08-25 NOTE — Plan of Care (Signed)
Problem: Phase II Progression Outcomes Goal: Stress Test if indicated Outcome: Progressing Completed day 1 of 2 day stress test

## 2013-08-25 NOTE — Consult Note (Signed)
CARDIOLOGY CONSULT NOTE   Patient ID: Denise Davis MRN: [id number], DOB/AGE: 12-28-1965   Admit date: 08/24/2013 Date of Consult: 08/25/2013  Primary Physician: Marguerita Merles, MD Primary Cardiologist: None  Reason for consult:  Chest pain, SOB, Syncope  Problem List  Past Medical History  Diagnosis Date  . Diabetes mellitus   . Hypertension   . DVT of leg (deep venous thrombosis) 12/2007    left leg, given warfarin for 6 mos  . Morbid obesity   . Renal cell cancer 2009    S/p nephrectomy (left)  . Pericarditis     Diagnosed/treated at [facility]; per patient 4 episodes per cardiologist Dr. Edwin Dada persistant chest pain unclear if pericarditis verusus neuropsychogenic   . H/O hiatal hernia   . Anxiety   . Shortness of breath     Past Surgical History  Procedure Laterality Date  . Endometrial ablation  2007  . Tubal ligation    . Insertion of mesh  2014  . Umbilical hernia repair      Allergies  Allergies  Allergen Reactions  . Milk-Related Compounds     Diarrhea and vomiting  "only milk causes problems. Milk products are ok"    HPI   Denise Davis is a 48 y.o. female with history of diabetes mellitus, hypertension, renal cell cancer status post nephrectomy, COPD on home oxygen was brought to the ER patient had loss of consciousness. Per patient's husband patient was a passenger in the car when patient became unresponsive. They do not know how long she was unresponsive because when they reached the destination and tried to wake up the patient she was not waking up. They called the EMS and once EMS kept on oxygen patient woke up. Patient does not recall the incident. She states that prior to the incident she experienced chest pain without any radiation or palpitations. She noticed right arm tingling. In the ER patient was complaining of some chest pain and headache. CT head and angiogram of the chest was negative for anything acute. EKG is unchanged from last year and  cardiac markers are negative. This morning The patient complains of right-sided arm and leg weakness and tingling in her right arm and severe headache. Patient is usually wheelchair-bound. Patient denies any nausea vomiting abdominal pain diarrhea. Husband did not notice any seizure-like activities. CT chest ruled out pulmonary embolism. The patient was previously seen by Dr. Elisabeth Cara in hospital settings for chest pain that was considered to be related to GI issues. Per patient report she states that she followed on outpatient basis with Dr Stanford Breed and had a stress test performed last her in her office. However there is no documentation about none of the above.   Inpatient Medications  . amitriptyline  200 mg Oral QPM  . antiseptic oral rinse  15 mL Mouth Rinse BID  . aspirin EC  81 mg Oral Daily  . budesonide-formoterol  2 puff Inhalation BID  . carvedilol  12.5 mg Oral BID WC  . Chlorhexidine Gluconate Cloth  6 each Topical Q0600  . enoxaparin (LOVENOX) injection  40 mg Subcutaneous Q24H  . gabapentin  1,800 mg Oral QHS  . gabapentin  1,200 mg Oral Daily  . glipiZIDE  5 mg Oral BID AC  . hydrochlorothiazide  25 mg Oral Daily  . insulin aspart  0-9 Units Subcutaneous TID WC  . lisinopril  20 mg Oral Daily  . loratadine  10 mg Oral QHS  . mupirocin ointment  1 application  Nasal BID  . pantoprazole  40 mg Oral Daily  . sertraline  200 mg Oral QHS  . sodium chloride  3 mL Intravenous Q12H  . sodium chloride  3 mL Intravenous Q12H  . tiotropium  18 mcg Inhalation Daily  . triamcinolone  2 spray Each Nare Daily    Family History Family History  Problem Relation Age of Onset  . Hypertension Mother   . Breast cancer Mother   . Heart disease Paternal Grandmother      Social History History   Social History  . Marital Status: Married    Spouse Name: N/A    Number of Children: N/A  . Years of Education: N/A   Occupational History  . Not on file.   Social History Main Topics    . Smoking status: Former Research scientist (life sciences)  . Smokeless tobacco: Never Used  . Alcohol Use: No  . Drug Use: No  . Sexual Activity: Not on file   Other Topics Concern  . 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  Cardiovascular:  No chest pain, dyspnea on exertion, edema, orthopnea, palpitations, paroxysmal nocturnal dyspnea. Dermatological: No rash, lesions/masses Respiratory: No cough, dyspnea Urologic: No hematuria, dysuria Abdominal:   No nausea, vomiting, diarrhea, bright red blood per rectum, melena, or hematemesis Neurologic:  No visual changes, wkns, changes in mental status. All other systems reviewed and are otherwise negative except as noted above.  Physical Exam  Blood pressure 124/68, pulse 104, temperature 97.8 F (36.6 C), temperature source Oral, resp. rate 22, height 5\' 5"  (1.651 m), weight 378 lb 14.4 oz (171.868 kg), SpO2 100.00%.  General: Pleasant, NAD, morbidly obese Psych: Normal affect. Neuro: Alert and oriented X 3. Moves all extremities spontaneously. HEENT: Normal  Neck: Supple without bruits or JVD. Lungs:  Resp regular and unlabored, CTA. Heart: RRR no s3, s4, or murmurs. Abdomen: Soft, non-tender, non-distended, BS + x 4.  Extremities: No clubbing, cyanosis or edema. DP/PT/Radials 2+ and equal bilaterally.  Labs  No results found for this basename: CKTOTAL, CKMB, TROPONINI,  in the last 72 hours Lab Results  Component Value Date   WBC 5.6 08/24/2013   HGB 10.5* 08/24/2013   HCT 32.4* 08/24/2013   MCV 88.8 08/24/2013   PLT 277 08/24/2013    Recent Labs Lab 08/24/13 2229  NA 136*  K 4.4  CL 98  CO2 24  BUN 14  CREATININE 0.88  CALCIUM 9.0  PROT 7.6  BILITOT <0.2*  ALKPHOS 88  ALT 15  AST 15  GLUCOSE 176*   Lab  Results  Component Value Date   CHOL  Value: 137        ATP III CLASSIFICATION:  <200     mg/dL   Desirable  200-239  mg/dL   Borderline High  >=240    mg/dL   High        04/14/2009   HDL 35* 04/14/2009   LDLCALC  Value: 73        Total Cholesterol/HDL:CHD Risk Coronary Heart Disease Risk Table                     Men   Women  1/2  Average Risk   3.4   3.3  Average Risk       5.0   4.4  2 X Average Risk   9.6   7.1  3 X Average Risk  23.4   11.0        Use the calculated Patient Ratio above and the CHD Risk Table to determine the patient's CHD Risk.        ATP III CLASSIFICATION (LDL):  <100     mg/dL   Optimal  100-129  mg/dL   Near or Above                    Optimal  130-159  mg/dL   Borderline  160-189  mg/dL   High  >190     mg/dL   Very High 04/14/2009   TRIG 143 04/14/2009   Lab Results  Component Value Date   DDIMER  Value: 0.41        AT THE INHOUSE ESTABLISHED CUTOFF VALUE OF 0.48 ug/mL FEU, THIS ASSAY HAS BEEN DOCUMENTED IN THE LITERATURE TO HAVE A SENSITIVITY AND NEGATIVE PREDICTIVE VALUE OF AT LEAST 98 TO 99%.  THE TEST RESULT SHOULD BE CORRELATED WITH AN ASSESSMENT OF THE CLINICAL PROBABILITY OF DVT / VTE. 12/09/2009   No components found with this basename: POCBNP,   Radiology/Studies  Dg Chest 2 View  08/24/2013   CLINICAL DATA:  Mid to RIGHT chest pain, shortness of breath, history hypertension, diabetes, renal cell carcinoma   IMPRESSION: Enlargement of cardiac silhouette with slight pulmonary vascular congestion.  No acute abnormalities.   Electronically   Ct Head Wo Contrast  08/25/2013   CLINICAL DATA:  Loss of consciousness.  Right-sided weakness.   IMPRESSION: Unremarkable noncontrast CT of the head.    Ct Angio Chest Pe W/cm &/or Wo Cm  08/25/2013   CLINICAL DATA:  Shortness of breath; syncope. Concern for pulmonary embolus.  IMPRESSION: 1. No evidence of pulmonary embolus. 2. Mild bilateral atelectasis noted; lungs otherwise clear.     Echocardiogram - 05/09/2011 Left  ventricle: The cavity size was normal. Systolic function was normal. The estimated ejection fraction was in the range of 55% to 60%. Wall motion was normal; there were no regional wall motion abnormalities. ------------------------------------------------------------ Aortic valve: Trileaflet; normal thickness leaflets. Mobility was not restricted. Doppler: Transvalvular velocity was within the normal range. There was no stenosis. No regurgitation. ------------------------------------------------------------ Aorta: Aortic root: The aortic root was normal in size. ----------------------------------------------------------- Mitral valve: Structurally normal valve. Mobility was not restricted. Doppler: Transvalvular velocity was within the normal range. There was no evidence for stenosis. No regurgitation. Peak gradient: 15mm Hg (D). ----------------------------------------------------------- Left atrium: The atrium was normal in size. ------------------------------------------------------------ Right ventricle: The cavity size was normal. Wall thickness was normal. Systolic function was normal. Tricuspid valve: Structurally normal valve. Doppler: Transvalvular velocity was within the normal range. No regurgitation. Pulmonary artery: The main pulmonary artery was normal-sized. Systolic pressure was within the normal range. Systemic veins: Inferior vena cava: The vessel was normal in size.  ECG: SR, negative T waves in V1-3, unchanged from 08/17/2012    ASSESSMENT AND PLAN  48 year old female is morbidly obese with a history of known insulin-dependent diabetes mellitus, hypertension, and COPD  1. Chest pain - followed by a syncopal episode - EKG unchanged from last year, negative cardiac markers x3. I. personally reviewed her chest CT performed for pulmonary embolism. The study is also limited quality for evaluation for coronary arteries because of the patient's  size, however no corner  calcium has been identified. Because of the above reason I won't recommend to perform a coronary CT. I would recommend to schedule him pharmacologic nuclear stress test to rule out ischemia.  The patient needs to be further evaluated by neurology as she still experiences right-sided weakness.  2. Hypertension - controlled  3. shortness of breath - the patient doesn't appear fluid overloaded on physical exam and it might be related to severe obesity and underlying COPD. The patient states that it has been chronic and stable.  Signed, Dorothy Spark, MD, [facility] 08/25/2013, 7:59 AM

## 2013-08-25 NOTE — Progress Notes (Signed)
Patient ID: Denise Davis  female  KWI:[id number]    DOB: Jun 29, 1965    DOA: 08/24/2013  PCP: Marguerita Merles, MD  Assessment/Plan: Principal Problem:   Chest pain with syncopal episode: Patient was passenger in the car during the episode - Cardiac enzymes negative so far, EKG showed no acute ST-T wave changes suggestive of ischemia - CT angiogram negative for any PE, lungs clear - Cardiology consulted, patient will undergolexiscan stress test today, 2-D echo  Active Problems: Right-sided weakness with numbness and tingling, headache - CT head negative for any acute CVA, patient's husband did not notice any seizure activity - MRI of the brain ordered stat to rule out any acute CVA, for now placed on aspirin 325 mg (was on 81 mg home), neurology consulted, d/w Dr Nicole Kindred.  - Patient had difficulty lifting up her right leg during encounter, if MRI of the pain is completely negative for stroke, may need obtain imaging of her hip     Diabetes mellitus - Continue sliding scale insulin - Hold oral hypoglycemic medications during hospitalization    Hypertension - Currently stable  DVT Prophylaxis:  Code Status: Full code  Family Communication:  Disposition:  Consultants:  Cardiology  Neurology  Procedures:  None  Antibiotics:  None    Subjective: Patient seen and examined, complaining of chest pain, headache, and right sided weakness  Objective: Weight change:  No intake or output data in the 24 hours ending 08/25/13 1101 Blood pressure 120/60, pulse 88, temperature 97.8 F (36.6 C), temperature source Oral, resp. rate 22, height 5\' 5"  (1.651 m), weight 171.868 kg (378 lb 14.4 oz), SpO2 100.00%.  Physical Exam: General: Alert and awake, oriented x3, uncomfortable CVS: S1-S2 clear, no murmur rubs or gallops Chest: clear to auscultation bilaterally, no wheezing, rales or rhonchi Abdomen: soft nontender, nondistended, normal bowel sounds  Extremities: no cyanosis,  clubbing or edema noted bilaterally Neuro: Right-sided weakness more pronounced in the lower extremity   Lab Results: Basic Metabolic Panel:  Recent Labs Lab 08/24/13 2229  NA 136*  K 4.4  CL 98  CO2 24  GLUCOSE 176*  BUN 14  CREATININE 0.88  CALCIUM 9.0   Liver Function Tests:  Recent Labs Lab 08/24/13 2229  AST 15  ALT 15  ALKPHOS 88  BILITOT <0.2*  PROT 7.6  ALBUMIN 3.3*   No results found for this basename: LIPASE, AMYLASE,  in the last 168 hours No results found for this basename: AMMONIA,  in the last 168 hours CBC:  Recent Labs Lab 08/24/13 2229  WBC 5.6  NEUTROABS 2.8  HGB 10.5*  HCT 32.4*  MCV 88.8  PLT 277   Cardiac Enzymes: No results found for this basename: CKTOTAL, CKMB, CKMBINDEX, TROPONINI,  in the last 168 hours BNP: No components found with this basename: POCBNP,  CBG:  Recent Labs Lab 08/24/13 2113 08/25/13 0402 08/25/13 0752  GLUCAP 128* 117* 144*     Micro Results: Recent Results (from the past 240 hour(s))  MRSA PCR SCREENING     Status: Abnormal   Collection Time    08/25/13  4:42 AM      Result Value Ref Range Status   MRSA by PCR POSITIVE (*) NEGATIVE Final   Comment:            The GeneXpert MRSA Assay (FDA     approved for NASAL specimens     only), is one component of a     comprehensive MRSA colonization  surveillance program. It is not     intended to diagnose MRSA     infection nor to guide or     monitor treatment for     MRSA infections.     RESULT CALLED TO, READ BACK BY AND VERIFIED WITH:     T.DORMON RN [id number] 08/25/13 E.GADDY    Studies/Results: Dg Chest 2 View  08/24/2013   CLINICAL DATA:  Mid to RIGHT chest pain, shortness of breath, history hypertension, diabetes, renal cell carcinoma  EXAM: CHEST  2 VIEW  COMPARISON:  08/16/2012  FINDINGS: Enlargement of cardiac silhouette.  Slight pulmonary vascular congestion.  Mediastinal contours normal.  No acute infiltrate, pleural effusion, or  pneumothorax.  Bones unremarkable.  IMPRESSION: Enlargement of cardiac silhouette with slight pulmonary vascular congestion.  No acute abnormalities.   Electronically Signed   By: Lavonia Dana M.D.   On: 08/24/2013 23:11   Ct Head Wo Contrast  08/25/2013   CLINICAL DATA:  Loss of consciousness.  Right-sided weakness.  EXAM: CT HEAD WITHOUT CONTRAST  TECHNIQUE: Contiguous axial images were obtained from the base of the skull through the vertex without intravenous contrast.  COMPARISON:  None.  FINDINGS: There is no evidence of acute infarction, mass lesion, or intra- or extra-axial hemorrhage on CT.  The posterior fossa, including the cerebellum, brainstem and fourth ventricle, is within normal limits. The third and lateral ventricles, and basal ganglia are unremarkable in appearance. The cerebral hemispheres are symmetric in appearance, with normal gray-white differentiation. No mass effect or midline shift is seen.  There is no evidence of fracture; visualized osseous structures are unremarkable in appearance. The orbits are within normal limits. The paranasal sinuses and mastoid air cells are well-aerated. No significant soft tissue abnormalities are seen.  IMPRESSION: Unremarkable noncontrast CT of the head.   Electronically Signed   By: Garald Balding M.D.   On: 08/25/2013 00:40   Ct Angio Chest Pe W/cm &/or Wo Cm  08/25/2013   CLINICAL DATA:  Shortness of breath; syncope. Concern for pulmonary embolus.  EXAM: CT ANGIOGRAPHY CHEST WITH CONTRAST  TECHNIQUE: Multidetector CT imaging of the chest was performed using the standard protocol during bolus administration of intravenous contrast. Multiplanar CT image reconstructions and MIPs were obtained to evaluate the vascular anatomy.  CONTRAST:  158mL OMNIPAQUE IOHEXOL 350 MG/ML SOLN  COMPARISON:  CTA of the chest performed 05/09/2011, and chest radiograph performed 08/24/2013  FINDINGS: There is no evidence of pulmonary embolus.  Mild bilateral atelectasis is  noted. The lungs are otherwise clear. There is no evidence of significant focal consolidation, pleural effusion or pneumothorax. No masses are identified; no abnormal focal contrast enhancement is seen.  The mediastinum is unremarkable in appearance. No mediastinal lymphadenopathy is seen. No pericardial effusion is identified. The great vessels are grossly unremarkable in appearance. No axillary lymphadenopathy is seen. The visualized portions of the thyroid gland are unremarkable in appearance.  The visualized portions of the liver and spleen are unremarkable.  No acute osseous abnormalities are seen.  Review of the MIP images confirms the above findings.  IMPRESSION: 1. No evidence of pulmonary embolus. 2. Mild bilateral atelectasis noted; lungs otherwise clear.   Electronically Signed   By: Garald Balding M.D.   On: 08/25/2013 01:53    Medications: Scheduled Meds: . amitriptyline  200 mg Oral QPM  . antiseptic oral rinse  15 mL Mouth Rinse BID  . aspirin EC  325 mg Oral Daily  . budesonide-formoterol  2 puff Inhalation BID  .  carvedilol  12.5 mg Oral BID WC  . Chlorhexidine Gluconate Cloth  6 each Topical Q0600  . enoxaparin (LOVENOX) injection  40 mg Subcutaneous Q24H  . gabapentin  1,800 mg Oral QHS  . gabapentin  1,200 mg Oral Daily  . glipiZIDE  5 mg Oral BID AC  . hydrochlorothiazide  25 mg Oral Daily  . insulin aspart  0-9 Units Subcutaneous TID WC  . lisinopril  20 mg Oral Daily  . loratadine  10 mg Oral QHS  . mupirocin ointment  1 application Nasal BID  . pantoprazole  40 mg Oral Daily  . sertraline  200 mg Oral QHS  . sodium chloride  3 mL Intravenous Q12H  . sodium chloride  3 mL Intravenous Q12H  . tiotropium  18 mcg Inhalation Daily  . triamcinolone  2 spray Each Nare Daily      LOS: 1 day   Ripudeep Krystal Eaton M.D. [facility] 08/25/2013, 11:01 AM Pager: [phone]  If 7PM-7AM, please contact night-coverage [email or web address] Password [email or web address]  **Disclaimer: This note  was dictated with voice recognition software. Similar sounding words can inadvertently be transcribed and this note may contain transcription errors which may not have been corrected upon publication of note.**

## 2013-08-25 NOTE — ED Provider Notes (Signed)
Patient presented to the ER with syncope. Patient reports that she has been feeling short of breath for the course of today. She has a pain in the center of her chest that worsens when she breathes. She had been feeling weak and dizzy through the course of today, then had an episode of loss of consciousness. She reports that she was riding in the truck with her husband. She doesn't remember it happening and there were no precursor symptoms.  Face to face Exam: HEENT - PERRLA Lungs - CTAB Heart - RRR, no M/R/G Abd - S/NT/ND Neuro - alert, oriented x3  Plan: Patient with sudden loss of consciousness. She does have an extensive past medical history. As there were no warning signs for the syncope, she will require observation for unexplained syncope.  In addition to this, patient has had recent hospitalization and decreased mobility. She has a sinus tachycardia with a pleuritic chest pain. Will rule out PE prior to admission.   Orpah Greek, MD 08/25/13 0005

## 2013-08-25 NOTE — Progress Notes (Signed)
Lab called and stated that pt was positive for MRSA, protocol will be initiated.------Maecyn Panning, rn

## 2013-08-25 NOTE — Progress Notes (Signed)
Pt still complaining of chest pressure/pain which she came with, 8/10, Dr Hal Hope by the bedside, pt requested something for it, nitro x1 was given, pt stated nitro does not give her relief, requested for Vicodin, 2 vicodin was given, pt states had some relief. On-call for [facility] was notified. Will continue to monitor pt.------Jye Fariss, rn

## 2013-08-25 NOTE — Progress Notes (Signed)
Day 1 of 2 Lexiscan Cardiolite completed without complication. Nicci Vaughan PA-C

## 2013-08-26 ENCOUNTER — Observation Stay (HOSPITAL_COMMUNITY): Payer: Medicaid Other

## 2013-08-26 DIAGNOSIS — I369 Nonrheumatic tricuspid valve disorder, unspecified: Secondary | ICD-10-CM

## 2013-08-26 DIAGNOSIS — J984 Other disorders of lung: Secondary | ICD-10-CM

## 2013-08-26 DIAGNOSIS — R079 Chest pain, unspecified: Secondary | ICD-10-CM

## 2013-08-26 LAB — GLUCOSE, CAPILLARY
GLUCOSE-CAPILLARY: 153 mg/dL — AB (ref 70–99)
GLUCOSE-CAPILLARY: 140 mg/dL — AB (ref 70–99)
GLUCOSE-CAPILLARY: 159 mg/dL — AB (ref 70–99)
Glucose-Capillary: 164 mg/dL — ABNORMAL HIGH (ref 70–99)

## 2013-08-26 MED ORDER — HYDROCODONE-ACETAMINOPHEN 5-325 MG PO TABS: 1.0000 | ORAL_TABLET | Freq: Four times a day (QID) | ORAL | Status: DC | PRN

## 2013-08-26 MED ORDER — TECHNETIUM TC 99M SESTAMIBI GENERIC - CARDIOLITE
30.0000 | Freq: Once | INTRAVENOUS | Status: AC | PRN
  Administered 2013-08-26: 30 via INTRAVENOUS

## 2013-08-26 NOTE — Progress Notes (Signed)
Occupational Therapy Evaluation Patient Details Name: Denise Davis MRN: [id number] DOB: 1965/05/21 Today's Date: 08/26/2013    History of Present Illness 48 y.o. female with history of diabetes mellitus, hypertension, renal cell cancer status post nephrectomy, COPD on home oxygen was brought to the ER due to  loss of consciousness. Per patient's husband patient was in a truck when patient became unresponsive. Per MD note,They do not know how long she was unresponsive because when they reached the destination and tried to wake up the patient she was not waking up. They called the EMS and once EMS kept on oxygen patient woke up. Patient does not recall the incident. In the ER patient was complaining of some chest pain and headache. CT head and angiogram of the chest was negative for anything acute. EKG shows sinus rhythm with PVCs and cardiac markers are negative.   Clinical Impression   PTA, pt was mod I with mobility @ w/c level and was independent as functional ambulator. Family assists as needed. Pt with inconsistencies during assessment. C/o RUE weakness, but using WFL. C/o being SOB, but no dyspnea noted. O2 SATS 100% on 3L, which pt states she uses at home. Pt would benefit from wide BSC and carex tub bench, in addition to AE for due to body habitus. Will follow in am to assess for AE if not discharged.     Follow Up Recommendations  No OT follow up;Supervision - Intermittent    Equipment Recommendations  Tub/shower bench;3 in 1 bedside comode    Recommendations for Other Services       Precautions / Restrictions Precautions Precautions: Fall      Mobility Bed Mobility Overal bed mobility: Modified Independent                Transfers Overall transfer level: Modified independent               General transfer comment: Pt appeared to have difficulty with sit - stand . Nsg reports pt has been walking to bathroom independently    Balance Overall balance  assessment: History of Falls (Pt reports her L knee "locks")                                          ADL Overall ADL's : At baseline                                             Vision                     Perception     Praxis      Pertinent Vitals/Pain O2 SATS 100. VSS. C/o R shoulder pain - nsg gave meds     Hand Dominance     Extremity/Trunk Assessment Upper Extremity Assessment Upper Extremity Assessment: RUE deficits/detail RUE Deficits / Details: c/o R shoulder apin and inability to lift R UE against gravity, having to lift it with LUE (inconsistent)   Lower Extremity Assessment Lower Extremity Assessment: RLE deficits/detail RLE Deficits / Details: unable to hold RLE in knee extension against resistance       Communication Communication Communication: No difficulties   Cognition Arousal/Alertness: Awake/alert Behavior During Therapy: WFL for tasks assessed/performed Overall Cognitive Status: Within Functional Limits for tasks  assessed                     General Comments       Exercises       Shoulder Instructions      Home Living Family/patient expects to be discharged to:: Private residence Living Arrangements: Spouse/significant other;Children Available Help at Discharge: Available PRN/intermittently Type of Home: House Home Access: Ramped entrance     Home Layout: One level     Bathroom Shower/Tub: Tub/shower unit Shower/tub characteristics: Architectural technologist: Standard Bathroom Accessibility: Yes How Accessible: Accessible via walker Home Equipment: Wheelchair - manual   Additional Comments: on home O2 3L      Prior Functioning/Environment Level of Independence: Needs assistance  Gait / Transfers Assistance Needed: Pt primarily uses w/c due to O2 SATS and B knees giving out ADL's / Homemaking Assistance Needed: family assists with bathing whn needed. Has diffficulty with LB  ADl        OT Diagnosis: Generalized weakness   OT Problem List: Decreased range of motion;Obesity   OT Treatment/Interventions: Self-care/ADL training;DME and/or AE instruction;Energy conservation;Therapeutic activities;Patient/family education    OT Goals(Current goals can be found in the care plan section) Acute Rehab OT Goals Patient Stated Goal: to be stronger OT Goal Formulation: With patient Time For Goal Achievement: 09/09/13 Potential to Achieve Goals: Good  OT Frequency: Min 2X/week   Barriers to D/C:            Co-evaluation              End of Session Equipment Utilized During Treatment: Oxygen Nurse Communication: Mobility status  Activity Tolerance: Patient tolerated treatment well Patient left: in bed;with call bell/phone within reach   Time: 1740-1805 OT Time Calculation (min): 25 min Charges:  OT General Charges $OT Visit: 1 Procedure OT Evaluation $Initial OT Evaluation Tier I: 1 Procedure OT Treatments $Self Care/Home Management : 8-22 mins G-Codes: OT G-codes **NOT FOR INPATIENT CLASS** Functional Assessment Tool Used: clinical judgement Functional Limitation: Self care Self Care Current Status (V4098): At least 1 percent but less than 20 percent impaired, limited or restricted Self Care Goal Status (J1914): At least 1 percent but less than 20 percent impaired, limited or restricted  [facility] 08/26/2013, 6:12 PM   [facility], OTR/L  [phone] 08/26/2013

## 2013-08-26 NOTE — Progress Notes (Signed)
Subjective: Complaining of CP deep inside.  Objective: Vital signs in last 24 hours: Temp:  [97.7 F (36.5 C)-99.4 F (37.4 C)] 97.7 F (36.5 C) (05/28 0712) Pulse Rate:  [88] 88 (05/28 0712) Resp:  [20-22] 20 (05/28 0712) BP: (101-144)/(53-81) 144/81 mmHg (05/28 0712) SpO2:  [92 %-100 %] 95 % (05/28 0712) Weight:  [377 lb 1.6 oz (171.051 kg)] 377 lb 1.6 oz (171.051 kg) (05/28 0712) Last BM Date: 08/24/13  Intake/Output from previous day: 05/27 0701 - 05/28 0700 In: 480 [P.O.:480] Out: -  Intake/Output this shift:    Medications Current Facility-Administered Medications  Medication Dose Route Frequency Provider Last Rate Last Dose  . acetaminophen (TYLENOL) tablet 650 mg  650 mg Oral Q6H PRN Rise Patience, MD       Or  . acetaminophen (TYLENOL) suppository 650 mg  650 mg Rectal Q6H PRN Rise Patience, MD      . amitriptyline (ELAVIL) tablet 200 mg  200 mg Oral QPM Rise Patience, MD   200 mg at 08/25/13 2131  . antiseptic oral rinse (BIOTENE) solution 15 mL  15 mL Mouth Rinse BID Rise Patience, MD   15 mL at 08/25/13 2000  . aspirin EC tablet 325 mg  325 mg Oral Daily Ripudeep K Rai, MD   325 mg at 08/25/13 1030  . budesonide-formoterol (SYMBICORT) 160-4.5 MCG/ACT inhaler 2 puff  2 puff Inhalation BID Rise Patience, MD   2 puff at 08/25/13 2037  . carvedilol (COREG) tablet 12.5 mg  12.5 mg Oral BID WC Rise Patience, MD   12.5 mg at 08/25/13 1737  . Chlorhexidine Gluconate Cloth 2 % PADS 6 each  6 each Topical Q0600 Rise Patience, MD   6 each at 08/25/13 [phone]  . diazepam (VALIUM) tablet 10 mg  10 mg Oral TID PRN Rise Patience, MD      . enoxaparin (LOVENOX) injection 40 mg  40 mg Subcutaneous Q24H Rise Patience, MD   40 mg at 08/25/13 1029  . gabapentin (NEURONTIN) capsule 1,800 mg  1,800 mg Oral QHS Rise Patience, MD   1,800 mg at 08/25/13 2131  . gabapentin (NEURONTIN) tablet 1,200 mg  1,200 mg Oral Daily Rise Patience, MD   1,200 mg at 08/25/13 1030  . hydrochlorothiazide (HYDRODIURIL) tablet 25 mg  25 mg Oral Daily Rise Patience, MD   25 mg at 08/25/13 1030  . HYDROcodone-acetaminophen (NORCO/VICODIN) 5-325 MG per tablet 1 tablet  1 tablet Oral Q4H PRN Ripudeep K Rai, MD      . HYDROmorphone (DILAUDID) injection 1 mg  1 mg Intravenous Q4H PRN Ripudeep Krystal Eaton, MD   1 mg at 08/25/13 2130  . insulin aspart (novoLOG) injection 0-9 Units  0-9 Units Subcutaneous TID WC Rise Patience, MD   1 Units at 08/25/13 1737  . lisinopril (PRINIVIL,ZESTRIL) tablet 20 mg  20 mg Oral Daily Rise Patience, MD   20 mg at 08/25/13 1030  . loratadine (CLARITIN) tablet 10 mg  10 mg Oral QHS Rise Patience, MD   10 mg at 08/25/13 2131  . mupirocin ointment (BACTROBAN) 2 % 1 application  1 application Nasal BID Rise Patience, MD   1 application at 98/33/82 1030  . nitroGLYCERIN (NITROSTAT) SL tablet 0.4 mg  0.4 mg Sublingual Q5 min PRN Rise Patience, MD   0.4 mg at 08/25/13 0454  . ondansetron (ZOFRAN) tablet 4 mg  4  mg Oral Q6H PRN Rise Patience, MD       Or  . ondansetron [facility]) injection 4 mg  4 mg Intravenous Q6H PRN Rise Patience, MD      . pantoprazole (PROTONIX) EC tablet 40 mg  40 mg Oral Daily Rise Patience, MD   40 mg at 08/25/13 1030  . promethazine (PHENERGAN) tablet 25 mg  25 mg Oral Q6H PRN Rise Patience, MD      . sertraline (ZOLOFT) tablet 200 mg  200 mg Oral QHS Rise Patience, MD   200 mg at 08/25/13 2131  . sodium chloride 0.9 % injection 3 mL  3 mL Intravenous Q12H Rise Patience, MD      . sodium chloride 0.9 % injection 3 mL  3 mL Intravenous Q12H Rise Patience, MD   3 mL at 08/25/13 2134  . tiotropium (SPIRIVA) inhalation capsule 18 mcg  18 mcg Inhalation Daily Rise Patience, MD   18 mcg at 08/25/13 1048  . triamcinolone (NASACORT) nasal inhaler 2 spray  2 spray Each Nare Daily Rise Patience, MD   2 spray at 08/25/13  1000    PE: General appearance: alert, cooperative and no distress Lungs: Decreased BS bilaterally an right wheeze Heart: regular rate and rhythm, S1, S2 normal, no murmur, click, rub or gallop Extremities: No LEE Pulses: 2+ and symmetric Skin: Warm and dry Neurologic: Grossly normal  Lab Results:   Recent Labs  08/24/13 2229  WBC 5.6  HGB 10.5*  HCT 32.4*  PLT 277   BMET  Recent Labs  08/24/13 2229  NA 136*  K 4.4  CL 98  CO2 24  GLUCOSE 176*  BUN 14  CREATININE 0.88  CALCIUM 9.0     Assessment/Plan  Denise Davis is a 48 y.o. female with history of diabetes mellitus, hypertension, renal cell cancer status post nephrectomy, COPD on home oxygen was brought to the ER patient had loss of consciousness  Principal Problem:   Chest pain Continues to have CP.  Ruled out for MI.  Day 2 of stress test today.  CTA negative for PE.  Echo still pending.  Active Problems:   Diabetes mellitus  Per primary   Hypertension  BP mildly elevated this morning but has been controlled.  Continue to monitor.  Coreg 125 bid, HCTZ 25, lisinopril 20,    Syncope   Right sided weakness  Neurology consulted.  Normal MRI    LOS: 2 days    Tarri Fuller PA-C 08/26/2013 8:30 AM  As above; patient seen and examined; she has continuous CP for 3 days; enzymes neg; await echo and nuclear results; if neg, no plans further ischemia eval.  Lelon Perla

## 2013-08-26 NOTE — Progress Notes (Signed)
Echocardiogram 2D Echocardiogram has been performed.  Denise Davis 08/26/2013, 12:13 PM

## 2013-08-26 NOTE — ED Provider Notes (Signed)
Medical screening examination/treatment/procedure(s) were conducted as a shared visit with non-physician practitioner(s) and myself.  I personally evaluated the patient during the encounter.   EKG Interpretation   Date/Time:  Tuesday Aug 24 2013 20:55:43 EDT Ventricular Rate:  99 PR Interval:  154 QRS Duration: 79 QT Interval:  348 QTC Calculation: 447 R Axis:   41 Text Interpretation:  Sinus rhythm Abnormal R-wave progression, early  transition Borderline T abnormalities, anterior leads No significant  change since last tracing Confirmed by POLLINA  MD, CHRISTOPHER [phone]) on  08/25/2013 12:19:57 AM      Presented to ER for evaluation of syncope and shortness of breath. Cardiopulmonary as follows neurologic causes were considered. With her shortness of breath and mild tachycardia, PE was considered. CT angiography, however was unremarkable. The patient passed out without any warning, it was felt that she would require observation overnight for further evaluation for unexplained syncope.  Orpah Greek, MD 08/26/13 [phone]

## 2013-08-26 NOTE — Progress Notes (Signed)
PT Cancellation Note  Patient Details Name: BOBBI YOUNT MRN: [id number] DOB: 1965-11-28   Cancelled Treatment:    Reason Eval/Treat Not Completed: Patient at procedure or test/unavailable--Will check back later today as schedule permits. Thanks.    Weston Anna, MPT Pager: [phone]

## 2013-08-26 NOTE — Progress Notes (Signed)
Patient ID: Denise Davis  female  IEP:[id number]    DOB: [birth year]    DOA: 08/24/2013  PCP: Marguerita Merles, MD  Assessment/Plan: Principal Problem:   Chest pain with syncopal episode: Patient was passenger in the car during the episode - Cardiac enzymes negative so far, EKG showed no acute ST-T wave changes suggestive of ischemia - CT angiogram negative for any PE. Cardiology was consulted, patient undergoing 2 day lexiscan stress test today, 2-D echo also pending  Active Problems: Right-sided weakness with numbness and tingling, headache: Now improved - CT head negative for any acute CVA, patient's husband did not notice any seizure activity - MRI of the brain showed no acute CVA. Neurology was consulted and felt exam showing multiple inconsistencies and significant giveaway weakness did not recommend any further neurodiagnostic studies.  - Today patient has no weakness and is ambulating.  - Pt eval is pending      Diabetes mellitus - Continue sliding scale insulin - Hold oral hypoglycemic medications during hospitalization    Hypertension - Currently stable  DVT Prophylaxis:  Code Status: Full code  Family Communication:  Disposition: Hopefully today if stress test neg, echo results are available  Consultants:  Cardiology  Neurology  Procedures:  None  Antibiotics:  None    Subjective: Patient seen and examined, significantly improved, no right sided weakness Objective: Weight change:   Intake/Output Summary (Last 24 hours) at 08/26/13 1147 Last data filed at 08/26/13 1049  Gross per 24 hour  Intake    723 ml  Output      0 ml  Net    723 ml   Blood pressure 144/81, pulse 88, temperature 97.7 F (36.5 C), temperature source Oral, resp. rate 20, height 5\' 5"  (1.651 m), weight 171.051 kg (377 lb 1.6 oz), SpO2 95.00%.  Physical Exam: General: Alert and awake, oriented x3, uncomfortable CVS: S1-S2 clear, no murmur rubs or gallops Chest: clear to  auscultation bilaterally, no wheezing, rales or rhonchi Abdomen: soft nontender, nondistended, normal bowel sounds  Extremities: no cyanosis, clubbing or edema noted bilaterally Neuro:  no focal neurological deficits  Lab Results: Basic Metabolic Panel:  Recent Labs Lab 08/24/13 2229  NA 136*  K 4.4  CL 98  CO2 24  GLUCOSE 176*  BUN 14  CREATININE 0.88  CALCIUM 9.0   Liver Function Tests:  Recent Labs Lab 08/24/13 2229  AST 15  ALT 15  ALKPHOS 88  BILITOT <0.2*  PROT 7.6  ALBUMIN 3.3*   No results found for this basename: LIPASE, AMYLASE,  in the last 168 hours No results found for this basename: AMMONIA,  in the last 168 hours CBC:  Recent Labs Lab 08/24/13 2229  WBC 5.6  NEUTROABS 2.8  HGB 10.5*  HCT 32.4*  MCV 88.8  PLT 277   Cardiac Enzymes:  Recent Labs Lab 08/25/13 1130 08/25/13 1532  TROPONINI <0.30 <0.30   BNP: No components found with this basename: POCBNP,  CBG:  Recent Labs Lab 08/25/13 1145 08/25/13 1704 08/25/13 2053 08/26/13 0752 08/26/13 1144  GLUCAP 115* 147* 146* 153* 164*     Micro Results: Recent Results (from the past 240 hour(s))  MRSA PCR SCREENING     Status: Abnormal   Collection Time    08/25/13  4:42 AM      Result Value Ref Range Status   MRSA by PCR POSITIVE (*) NEGATIVE Final   Comment:            The GeneXpert  MRSA Assay (FDA     approved for NASAL specimens     only), is one component of a     comprehensive MRSA colonization     surveillance program. It is not     intended to diagnose MRSA     infection nor to guide or     monitor treatment for     MRSA infections.     RESULT CALLED TO, READ BACK BY AND VERIFIED WITH:     T.DORMON RN [id number] 08/25/13 E.GADDY    Studies/Results: Dg Chest 2 View  08/24/2013   CLINICAL DATA:  Mid to RIGHT chest pain, shortness of breath, history hypertension, diabetes, renal cell carcinoma  EXAM: CHEST  2 VIEW  COMPARISON:  08/16/2012  FINDINGS: Enlargement of cardiac  silhouette.  Slight pulmonary vascular congestion.  Mediastinal contours normal.  No acute infiltrate, pleural effusion, or pneumothorax.  Bones unremarkable.  IMPRESSION: Enlargement of cardiac silhouette with slight pulmonary vascular congestion.  No acute abnormalities.   Electronically Signed   By: Lavonia Dana M.D.   On: 08/24/2013 23:11   Ct Head Wo Contrast  08/25/2013   CLINICAL DATA:  Loss of consciousness.  Right-sided weakness.  EXAM: CT HEAD WITHOUT CONTRAST  TECHNIQUE: Contiguous axial images were obtained from the base of the skull through the vertex without intravenous contrast.  COMPARISON:  None.  FINDINGS: There is no evidence of acute infarction, mass lesion, or intra- or extra-axial hemorrhage on CT.  The posterior fossa, including the cerebellum, brainstem and fourth ventricle, is within normal limits. The third and lateral ventricles, and basal ganglia are unremarkable in appearance. The cerebral hemispheres are symmetric in appearance, with normal gray-white differentiation. No mass effect or midline shift is seen.  There is no evidence of fracture; visualized osseous structures are unremarkable in appearance. The orbits are within normal limits. The paranasal sinuses and mastoid air cells are well-aerated. No significant soft tissue abnormalities are seen.  IMPRESSION: Unremarkable noncontrast CT of the head.   Electronically Signed   By: Garald Balding M.D.   On: 08/25/2013 00:40   Ct Angio Chest Pe W/cm &/or Wo Cm  08/25/2013   CLINICAL DATA:  Shortness of breath; syncope. Concern for pulmonary embolus.  EXAM: CT ANGIOGRAPHY CHEST WITH CONTRAST  TECHNIQUE: Multidetector CT imaging of the chest was performed using the standard protocol during bolus administration of intravenous contrast. Multiplanar CT image reconstructions and MIPs were obtained to evaluate the vascular anatomy.  CONTRAST:  186mL OMNIPAQUE IOHEXOL 350 MG/ML SOLN  COMPARISON:  CTA of the chest performed 05/09/2011, and  chest radiograph performed 08/24/2013  FINDINGS: There is no evidence of pulmonary embolus.  Mild bilateral atelectasis is noted. The lungs are otherwise clear. There is no evidence of significant focal consolidation, pleural effusion or pneumothorax. No masses are identified; no abnormal focal contrast enhancement is seen.  The mediastinum is unremarkable in appearance. No mediastinal lymphadenopathy is seen. No pericardial effusion is identified. The great vessels are grossly unremarkable in appearance. No axillary lymphadenopathy is seen. The visualized portions of the thyroid gland are unremarkable in appearance.  The visualized portions of the liver and spleen are unremarkable.  No acute osseous abnormalities are seen.  Review of the MIP images confirms the above findings.  IMPRESSION: 1. No evidence of pulmonary embolus. 2. Mild bilateral atelectasis noted; lungs otherwise clear.   Electronically Signed   By: Garald Balding M.D.   On: 08/25/2013 01:53    Medications: Scheduled Meds: . amitriptyline  200 mg Oral QPM  . antiseptic oral rinse  15 mL Mouth Rinse BID  . aspirin EC  325 mg Oral Daily  . budesonide-formoterol  2 puff Inhalation BID  . carvedilol  12.5 mg Oral BID WC  . Chlorhexidine Gluconate Cloth  6 each Topical Q0600  . enoxaparin (LOVENOX) injection  40 mg Subcutaneous Q24H  . gabapentin  1,800 mg Oral QHS  . gabapentin  1,200 mg Oral Daily  . hydrochlorothiazide  25 mg Oral Daily  . insulin aspart  0-9 Units Subcutaneous TID WC  . lisinopril  20 mg Oral Daily  . loratadine  10 mg Oral QHS  . mupirocin ointment  1 application Nasal BID  . pantoprazole  40 mg Oral Daily  . sertraline  200 mg Oral QHS  . sodium chloride  3 mL Intravenous Q12H  . sodium chloride  3 mL Intravenous Q12H  . tiotropium  18 mcg Inhalation Daily  . triamcinolone  2 spray Each Nare Daily      LOS: 2 days   Dajanee Voorheis Krystal Eaton M.D. [facility] 08/26/2013, 11:47 AM Pager: [phone]  If  7PM-7AM, please contact night-coverage [email or web address] Password [email or web address]  **Disclaimer: This note was dictated with voice recognition software. Similar sounding words can inadvertently be transcribed and this note may contain transcription errors which may not have been corrected upon publication of note.**

## 2013-08-27 DIAGNOSIS — Z7982 Long term (current) use of aspirin: Secondary | ICD-10-CM | POA: Diagnosis not present

## 2013-08-27 DIAGNOSIS — R55 Syncope and collapse: Secondary | ICD-10-CM | POA: Diagnosis present

## 2013-08-27 DIAGNOSIS — D649 Anemia, unspecified: Secondary | ICD-10-CM | POA: Diagnosis present

## 2013-08-27 DIAGNOSIS — Z86718 Personal history of other venous thrombosis and embolism: Secondary | ICD-10-CM | POA: Diagnosis not present

## 2013-08-27 DIAGNOSIS — J449 Chronic obstructive pulmonary disease, unspecified: Secondary | ICD-10-CM | POA: Diagnosis present

## 2013-08-27 DIAGNOSIS — Z87891 Personal history of nicotine dependence: Secondary | ICD-10-CM | POA: Diagnosis not present

## 2013-08-27 DIAGNOSIS — Z993 Dependence on wheelchair: Secondary | ICD-10-CM | POA: Diagnosis not present

## 2013-08-27 DIAGNOSIS — G8190 Hemiplegia, unspecified affecting unspecified side: Secondary | ICD-10-CM | POA: Diagnosis present

## 2013-08-27 DIAGNOSIS — M25569 Pain in unspecified knee: Secondary | ICD-10-CM | POA: Diagnosis present

## 2013-08-27 DIAGNOSIS — G8929 Other chronic pain: Secondary | ICD-10-CM | POA: Diagnosis present

## 2013-08-27 DIAGNOSIS — Z9981 Dependence on supplemental oxygen: Secondary | ICD-10-CM | POA: Diagnosis not present

## 2013-08-27 DIAGNOSIS — J9610 Chronic respiratory failure, unspecified whether with hypoxia or hypercapnia: Secondary | ICD-10-CM | POA: Diagnosis present

## 2013-08-27 DIAGNOSIS — Z6841 Body Mass Index (BMI) 40.0 and over, adult: Secondary | ICD-10-CM | POA: Diagnosis not present

## 2013-08-27 DIAGNOSIS — F411 Generalized anxiety disorder: Secondary | ICD-10-CM | POA: Diagnosis present

## 2013-08-27 DIAGNOSIS — R0789 Other chest pain: Secondary | ICD-10-CM | POA: Diagnosis present

## 2013-08-27 DIAGNOSIS — R404 Transient alteration of awareness: Secondary | ICD-10-CM | POA: Diagnosis present

## 2013-08-27 DIAGNOSIS — Z85528 Personal history of other malignant neoplasm of kidney: Secondary | ICD-10-CM | POA: Diagnosis not present

## 2013-08-27 DIAGNOSIS — E119 Type 2 diabetes mellitus without complications: Secondary | ICD-10-CM | POA: Diagnosis present

## 2013-08-27 DIAGNOSIS — Z79899 Other long term (current) drug therapy: Secondary | ICD-10-CM | POA: Diagnosis not present

## 2013-08-27 DIAGNOSIS — I10 Essential (primary) hypertension: Secondary | ICD-10-CM | POA: Diagnosis present

## 2013-08-27 LAB — GLUCOSE, CAPILLARY
Glucose-Capillary: 127 mg/dL — ABNORMAL HIGH (ref 70–99)
Glucose-Capillary: 124 mg/dL — ABNORMAL HIGH (ref 70–99)
Glucose-Capillary: 165 mg/dL — ABNORMAL HIGH (ref 70–99)

## 2013-08-27 MED ORDER — HYDROCODONE-ACETAMINOPHEN 5-325 MG PO TABS: 1.0000 | ORAL_TABLET | Freq: Four times a day (QID) | ORAL | Status: DC | PRN

## 2013-08-27 MED ORDER — TRAMADOL HCL 50 MG PO TABS: 50.0000 mg | ORAL_TABLET | Freq: Four times a day (QID) | ORAL | Status: DC | PRN

## 2013-08-27 NOTE — Progress Notes (Signed)
Patient changed to inpatient- + stress test

## 2013-08-27 NOTE — Progress Notes (Signed)
Occupational Therapy Treatment Patient Details Name: Denise Davis MRN: [id number] DOB: 08-14-1965 Today's Date: 08/27/2013    History of present illness 48 y.o. female with history of diabetes mellitus, hypertension, renal cell cancer status post nephrectomy, COPD on home oxygen was brought to the ER patient had loss of consciousness. Per patient's husband patient was on a truck when patient became unresponsive. They do not know how long she was unresponsive because when they reached the destination and tried to wake up the patient she was not waking up. They called the EMS and once EMS kept on oxygen patient woke up. Patient does not recall the incident. In the ER patient was complaining of some chest pain and headache. CT head and angiogram of the chest was negative for anything acute. EKG shows sinus rhythm with PVCs and cardiac markers are negative.   OT comments  Pt. agreeble to skilled OT session.  Able to ambulate to/from b.room with intermittent support on furniture.  Cues for safety during ambulation.  Denies need for A/E.  Has tech. For completion while sitting on bed.    Follow Up Recommendations  No OT follow up;Supervision - Intermittent    Equipment Recommendations  Tub/shower bench;3 in 1 bedside comode          Precautions / Restrictions Precautions Precautions: Fall       Mobility Bed Mobility Overal bed mobility: Modified Independent             General bed mobility comments: pt with use of rail to transfer to sitting  Transfers Overall transfer level: Modified independent               General transfer comment: able to amb. to/from b.room with intermittent UE support on sink counter    Balance Overall balance assessment: History of Falls                                 ADL Overall ADL's : Needs assistance/impaired                       Lower Body Dressing Details (indicate cue type and reason): declines need for A/E  states she sits on bed and pulls each leg onto bed while sitting to reach feet without difficulty Toilet Transfer: Min guard;Ambulation;Regular Toilet;Grab bars Toilet Transfer Details (indicate cue type and reason): pt. states she "uses furniture to walk", reviewed need to hold onto non moving furniture, no wheels ect. Toileting- Clothing Manipulation and Hygiene: Supervision/safety;Sit to/from Nurse, children's Details (indicate cue type and reason): reviewed, pt. states she would be interested in getting a bench Functional mobility during ADLs: Min guard;Minimal assistance General ADL Comments: pt. has previosly adapted tech. for completing self care tasks.  denies need for A/E                                      Cognition   Behavior During Therapy: [facility] for tasks assessed/performed Overall Cognitive Status: Within Functional Limits for tasks assessed                            l                   Pertinent Vitals/ Pain  No c/o pain  Home Living Family/patient expects to be discharged to:: Private residence Living Arrangements: Spouse/significant other;Children Available Help at Discharge: Available PRN/intermittently Type of Home: House Home Access: Ramped entrance     Home Layout: One level               Bier: Wheelchair - [employer] - 2 wheels   Additional Comments: on home O2 3L      Prior Functioning/Environment Level of Independence: Needs assistance  Gait / Transfers Assistance Needed: Pt primarily uses w/c out of the house due to O2 SATS and B knees giving out, uses RW limited distance 30' in house ADL's / Homemaking Assistance Needed: family assists with bathing when needed. Has diffficulty with LB ADl       Frequency Min 2X/week     Progress Toward Goals  OT Goals(current goals can now be found in the care plan section)  Progress towards OT goals: Progressing toward goals  Acute  Rehab OT Goals Patient Stated Goal: to be able to go shopping and walk the dogs  Plan Discharge plan remains appropriate                     End of Session Equipment Utilized During Treatment: Oxygen   Activity Tolerance Patient tolerated treatment well    in bed;with call bell/phone within reach;with nursing/sitter in room             Time: 0092-3300 OT Time Calculation (min): 17 min  Charges: OT General Charges $OT Visit: 1 Procedure OT Treatments $Self Care/Home Management : 8-22 mins  [place], COTA/L 08/27/2013, 2:29 PM

## 2013-08-27 NOTE — Evaluation (Signed)
Physical Therapy Evaluation Patient Details Name: Denise Davis MRN: [id number] DOB: 1965/12/25 Today's Date: 08/27/2013   History of Present Illness  48 y.o. female with history of diabetes mellitus, hypertension, renal cell cancer status post nephrectomy, COPD on home oxygen was brought to the ER patient had loss of consciousness. Per patient's husband patient was on a truck when patient became unresponsive. They do not know how long she was unresponsive because when they reached the destination and tried to wake up the patient she was not waking up. They called the EMS and once EMS kept on oxygen patient woke up. Patient does not recall the incident. In the ER patient was complaining of some chest pain and headache. CT head and angiogram of the chest was negative for anything acute. EKG shows sinus rhythm with PVCs and cardiac markers are negative.  Clinical Impression  Pt very pleasant and reports decreased function and continued decline over the last year due to knee pain and buckling, decreased oxygen saturations with mobility, increased weight gain and depression. Pt educated for HEP, activity progression to increase mobility, healthy eating, how her weight impacts limited function and motivation for home movement with options of daily roles. Pt appreciated all education and states she will begin to apply to improve life and decrease burden of care. Pt on RA throughout session with sats 100% supine and transfer to sit and stand, with gait drop to 86% with return to 95% after 5 sec seated rest. Pt will benefit from acute therapy to maximize strength, ROM, gait and function to increase pt mobility and quality of life.     Follow Up Recommendations Home health PT    Equipment Recommendations  None recommended by PT    Recommendations for Other Services       Precautions / Restrictions Precautions Precautions: Fall      Mobility  Bed Mobility Overal bed mobility: Modified Independent              General bed mobility comments: pt with use of rail to transfer to sitting  Transfers Overall transfer level: Modified independent                  Ambulation/Gait Ambulation/Gait assistance: Supervision Ambulation Distance (Feet): 30 Feet Assistive device: Rolling walker (2 wheeled) Gait Pattern/deviations: Step-through pattern;Decreased stride length;Trunk flexed   Gait velocity interpretation: Below normal speed for age/gender General Gait Details: cues for posture, RW too small for pt to fully fit inside but states she has wide RW at home  Stairs            Wheelchair Mobility    Modified Rankin (Stroke Patients Only)       Balance Overall balance assessment: History of Falls                                           Pertinent Vitals/Pain bil Knee pain only if attempting to fully extend in seated position HR 102    Home Living Family/patient expects to be discharged to:: Private residence Living Arrangements: Spouse/significant other;Children Available Help at Discharge: Available PRN/intermittently Type of Home: House Home Access: Ramped entrance     Home Layout: One level Home Equipment: Wheelchair - [employer] - 2 wheels Additional Comments: on home O2 3L    Prior Function Level of Independence: Needs assistance   Gait / Transfers Assistance Needed: Pt primarily uses  w/c out of the house due to O2 SATS and B knees giving out, uses RW limited distance 30' in house  ADL's / Homemaking Assistance Needed: family assists with bathing when needed. Has diffficulty with LB ADl        Hand Dominance        Extremity/Trunk Assessment   Upper Extremity Assessment: Defer to OT evaluation           Lower Extremity Assessment: Generalized weakness RLE Deficits / Details: pt with decreased bil knee extension due to pain grossly -10degrees. Strength grossly 2+/5    Cervical / Trunk Assessment: Normal   Communication   Communication: No difficulties  Cognition Arousal/Alertness: Awake/alert Behavior During Therapy: WFL for tasks assessed/performed Overall Cognitive Status: Within Functional Limits for tasks assessed                      General Comments      Exercises General Exercises - Lower Extremity Short Arc Quad: AROM;Seated;Both;10 reps Hip Flexion/Marching: AROM;Seated;Both;10 reps      Assessment/Plan    PT Assessment Patient needs continued PT services  PT Diagnosis Difficulty walking;Generalized weakness   PT Problem List Decreased strength;Decreased range of motion;Decreased activity tolerance;Decreased mobility;Cardiopulmonary status limiting activity;Obesity  PT Treatment Interventions DME instruction;Gait training;Functional mobility training;Therapeutic activities;Therapeutic exercise;Patient/family education   PT Goals (Current goals can be found in the Care Plan section) Acute Rehab PT Goals Patient Stated Goal: to be able to go shopping and walk the dogs PT Goal Formulation: With patient Time For Goal Achievement: 09/10/13 Potential to Achieve Goals: Fair    Frequency Min 2X/week   Barriers to discharge Decreased caregiver support      Co-evaluation               End of Session   Activity Tolerance: Patient tolerated treatment well Patient left: in chair;with call bell/phone within reach      Functional Assessment Tool Used: clinical judgement Functional Limitation: Mobility: Walking and moving around Mobility: Walking and Moving Around Current Status ([id number]): At least 1 percent but less than 20 percent impaired, limited or restricted Mobility: Walking and Moving Around Goal Status [phone]): At least 1 percent but less than 20 percent impaired, limited or restricted    Time: 1210-1247 PT Time Calculation (min): 37 min   Charges:   PT Evaluation $Initial PT Evaluation Tier I: 1 Procedure PT Treatments $Therapeutic Exercise:  8-22 mins $Therapeutic Activity: 8-22 mins   PT G Codes:   Functional Assessment Tool Used: clinical judgement Functional Limitation: Mobility: Walking and moving around    Vian 08/27/2013, 1:05 PM Elwyn Reach, [place]

## 2013-08-27 NOTE — Progress Notes (Signed)
Subjective: Still having CP  Objective: Vital signs in last 24 hours: Temp:  [98.3 F (36.8 C)-99 F (37.2 C)] 98.9 F (37.2 C) (05/29 0529) Pulse Rate:  [86-101] 86 (05/29 0529) Resp:  [16-20] 16 (05/29 0529) BP: (104-145)/(65-84) 104/74 mmHg (05/29 0529) SpO2:  [95 %-100 %] 100 % (05/29 0529) Last BM Date: 08/26/13  Intake/Output from previous day: 05/28 0701 - 05/29 0700 In: 363 [P.O.:360; I.V.:3] Out: -  Intake/Output this shift:    Medications Current Facility-Administered Medications  Medication Dose Route Frequency Provider Last Rate Last Dose  . acetaminophen (TYLENOL) tablet 650 mg  650 mg Oral Q6H PRN Rise Patience, MD       Or  . acetaminophen (TYLENOL) suppository 650 mg  650 mg Rectal Q6H PRN Rise Patience, MD      . amitriptyline (ELAVIL) tablet 200 mg  200 mg Oral QPM Rise Patience, MD   200 mg at 08/26/13 2137  . antiseptic oral rinse (BIOTENE) solution 15 mL  15 mL Mouth Rinse BID Rise Patience, MD   15 mL at 08/26/13 2000  . aspirin EC tablet 325 mg  325 mg Oral Daily Ripudeep Krystal Eaton, MD   325 mg at 08/26/13 1047  . budesonide-formoterol (SYMBICORT) 160-4.5 MCG/ACT inhaler 2 puff  2 puff Inhalation BID Rise Patience, MD   2 puff at 08/26/13 2227  . carvedilol (COREG) tablet 12.5 mg  12.5 mg Oral BID WC Rise Patience, MD   12.5 mg at 08/26/13 1740  . Chlorhexidine Gluconate Cloth 2 % PADS 6 each  6 each Topical Q0600 Rise Patience, MD   6 each at 08/27/13 0530  . diazepam (VALIUM) tablet 10 mg  10 mg Oral TID PRN Rise Patience, MD      . enoxaparin (LOVENOX) injection 40 mg  40 mg Subcutaneous Q24H Rise Patience, MD   40 mg at 08/25/13 1029  . gabapentin (NEURONTIN) capsule 1,800 mg  1,800 mg Oral QHS Rise Patience, MD   1,800 mg at 08/26/13 2137  . gabapentin (NEURONTIN) tablet 1,200 mg  1,200 mg Oral Daily Rise Patience, MD   1,200 mg at 08/26/13 1049  . hydrochlorothiazide (HYDRODIURIL)  tablet 25 mg  25 mg Oral Daily Rise Patience, MD   25 mg at 08/26/13 1049  . HYDROcodone-acetaminophen (NORCO/VICODIN) 5-325 MG per tablet 1 tablet  1 tablet Oral Q4H PRN Ripudeep Krystal Eaton, MD   1 tablet at 08/26/13 1740  . HYDROmorphone (DILAUDID) injection 1 mg  1 mg Intravenous Q4H PRN Ripudeep Krystal Eaton, MD   1 mg at 08/26/13 2149  . insulin aspart (novoLOG) injection 0-9 Units  0-9 Units Subcutaneous TID WC Rise Patience, MD   2 Units at 08/26/13 [phone]  . lisinopril (PRINIVIL,ZESTRIL) tablet 20 mg  20 mg Oral Daily Rise Patience, MD   20 mg at 08/26/13 1049  . loratadine (CLARITIN) tablet 10 mg  10 mg Oral QHS Rise Patience, MD   10 mg at 08/26/13 2137  . mupirocin ointment (BACTROBAN) 2 % 1 application  1 application Nasal BID Rise Patience, MD   1 application at 00/93/81 2139  . nitroGLYCERIN (NITROSTAT) SL tablet 0.4 mg  0.4 mg Sublingual Q5 min PRN Rise Patience, MD   0.4 mg at 08/25/13 0454  . ondansetron (ZOFRAN) tablet 4 mg  4 mg Oral Q6H PRN Rise Patience, MD  Or  . ondansetron (ZOFRAN) injection 4 mg  4 mg Intravenous Q6H PRN Rise Patience, MD      . pantoprazole (PROTONIX) EC tablet 40 mg  40 mg Oral Daily Rise Patience, MD   40 mg at 08/26/13 1047  . promethazine (PHENERGAN) tablet 25 mg  25 mg Oral Q6H PRN Rise Patience, MD      . sertraline (ZOLOFT) tablet 200 mg  200 mg Oral QHS Rise Patience, MD   200 mg at 08/26/13 2137  . sodium chloride 0.9 % injection 3 mL  3 mL Intravenous Q12H Rise Patience, MD      . sodium chloride 0.9 % injection 3 mL  3 mL Intravenous Q12H Rise Patience, MD   3 mL at 08/26/13 2149  . tiotropium (SPIRIVA) inhalation capsule 18 mcg  18 mcg Inhalation Daily Rise Patience, MD   18 mcg at 08/26/13 1037  . triamcinolone (NASACORT) nasal inhaler 2 spray  2 spray Each Nare Daily Rise Patience, MD   2 spray at 08/26/13 1000    PE: General appearance: alert, cooperative and  no distress  Lungs: Decreased BS bilaterally an right wheeze  Heart: regular rate and rhythm, S1, S2 normal, no murmur, click, rub or gallop  Abd:  Nontender in epigastric region. Extremities: No LEE  Pulses: 2+ and symmetric  Skin: Warm and dry  Neurologic: Grossly normal      Lab Results:   Recent Labs  08/24/13 2229  WBC 5.6  HGB 10.5*  HCT 32.4*  PLT 277   BMET  Recent Labs  08/24/13 2229  NA 136*  K 4.4  CL 98  CO2 24  GLUCOSE 176*  BUN 14  CREATININE 0.88  CALCIUM 9.0     Assessment/Plan  Denise Davis is a 48 y.o. female with history of diabetes mellitus, hypertension, renal cell cancer status post nephrectomy, COPD on home oxygen was brought to the ER patient had loss of consciousness   Principal Problem:  Chest pain  Continues to have CP. Ruled out for MI. No results from [facility] yet.  Echo completed but no report yet.  CTA negative for PE.   Diabetes mellitus   Per primary  Hypertension   BP mildly elevated this morning but has been controlled. Continue to monitor. Coreg 125 bid, HCTZ 25, lisinopril 20,  Syncope   Right sided weakness.  Neurology consulted. Normal MRI     LOS: 3 days    Tarri Fuller PA-C 08/27/2013 8:07 AM  As above, patient seen and examined. She continues to have chest pain. Her echocardiogram shows normal LV function. Await results of nuclear study. If negative she can be discharged. She should followup with her primary care physician. Lelon Perla

## 2013-08-27 NOTE — Progress Notes (Signed)
Patient ID: Denise Davis  female  ZOX:[id number]    DOB: Dec 29, 1965    DOA: 08/24/2013  PCP: Marguerita Merles, MD  Assessment/Plan: Principal Problem:   Chest pain with syncopal episode: Patient was passenger in the car during the episode - Cardiac enzymes negative so far, EKG showed no acute ST-T wave changes suggestive of ischemia - CT angiogram negative for any PE.  - Cardiology was consulted, 2-D echo showed EF of 81-19%, grade 1 diastolic dysfunction  - Patient underwent 2-day stress test, which showed apical septal inducible ischemia, LVF 54% discussed in detail with Dr. Stanford Breed, patient will likely need cardiac cath.   Active Problems: Right-sided weakness with numbness and tingling, headache : Resolved.  CT head negative for any acute CVA, patient's husband did not notice any seizure activity.  Neurology was consulted and MRI of the brain was done which showed no acute CVA.  Patient was placed on aspirin.  MRI of the brain showed no acute CVA. Neurology was consulted and felt exam showing multiple inconsistencies and significant giveaway weakness did not recommend any further neurodiagnostic studies.  Chronic right knee pain, she is chronically wheelchair-bound  Patient reports that she has been chronically having right knee pain, her primary care physician was recommending her to physical therapy.  Patient was seen by PT and OT during hospitalization, recommended intermittent supervision home PT .  I also recommended her to have her PCP refer patient to orthopedics for further management    Diabetes mellitus - Continue sliding scale insulin - Hold oral hypoglycemic medications during hospitalization    Hypertension - Currently stable  DVT Prophylaxis:  Code Status: Full code  Family Communication:  Disposition: Pending cardiology decision for cardiac cath    Consultants:  Cardiology  Neurology  Procedures:  None  Antibiotics:  None    Subjective: Patient seen and examined,  no fevers, chills, shortness of breath, nausea vomiting. Still complaining of some chest pain, no right sided weakness, chronic right knee pain  Objective: Weight change:   Intake/Output Summary (Last 24 hours) at 08/27/13 1428 Last data filed at 08/27/13 0900  Gross per 24 hour  Intake    240 ml  Output      0 ml  Net    240 ml   Blood pressure 150/101, pulse 102, temperature 98.9 F (37.2 C), temperature source Oral, resp. rate 16, height 5\' 5"  (1.651 m), weight 171.051 kg (377 lb 1.6 oz), SpO2 99.00%.  Physical Exam: General: Alert and awake, oriented x3, uncomfortable CVS: S1-S2 clear, no murmur rubs or gallops Chest: clear to auscultation bilaterally, no wheezing, rales or rhonchi Abdomen:  morbidly obese,soft nontender, nondistended, normal bowel sounds  Extremities: no cyanosis, clubbing or edema noted bilaterally Neuro:  no focal neurological deficits  Lab Results: Basic Metabolic Panel:  Recent Labs Lab 08/24/13 2229  NA 136*  K 4.4  CL 98  CO2 24  GLUCOSE 176*  BUN 14  CREATININE 0.88  CALCIUM 9.0   Liver Function Tests:  Recent Labs Lab 08/24/13 2229  AST 15  ALT 15  ALKPHOS 88  BILITOT <0.2*  PROT 7.6  ALBUMIN 3.3*   No results found for this basename: LIPASE, AMYLASE,  in the last 168 hours No results found for this basename: AMMONIA,  in the last 168 hours CBC:  Recent Labs Lab 08/24/13 2229  WBC 5.6  NEUTROABS 2.8  HGB 10.5*  HCT 32.4*  MCV 88.8  PLT 277   Cardiac Enzymes:  Recent  Labs Lab 08/25/13 1130 08/25/13 1532  TROPONINI <0.30 <0.30   BNP: No components found with this basename: POCBNP,  CBG:  Recent Labs Lab 08/26/13 0752 08/26/13 1144 08/26/13 1636 08/26/13 2003 08/27/13 0725  GLUCAP 153* 164* 140* 159* 127*     Micro Results: Recent Results (from the past 240 hour(s))   MRSA PCR SCREENING     Status: Abnormal   Collection Time    08/25/13  4:42 AM      Result Value Ref Range Status   MRSA by PCR POSITIVE (*) NEGATIVE Final   Comment:            The GeneXpert MRSA Assay (FDA     approved for NASAL specimens     only), is one component of a     comprehensive MRSA colonization     surveillance program. It is not     intended to diagnose MRSA     infection nor to guide or     monitor treatment for     MRSA infections.     RESULT CALLED TO, READ BACK BY AND VERIFIED WITH:     T.DORMON RN [id number] 08/25/13 E.GADDY    Studies/Results: Dg Chest 2 View  08/24/2013   CLINICAL DATA:  Mid to RIGHT chest pain, shortness of breath, history hypertension, diabetes, renal cell carcinoma  EXAM: CHEST  2 VIEW  COMPARISON:  08/16/2012  FINDINGS: Enlargement of cardiac silhouette.  Slight pulmonary vascular congestion.  Mediastinal contours normal.  No acute infiltrate, pleural effusion, or pneumothorax.  Bones unremarkable.  IMPRESSION: Enlargement of cardiac silhouette with slight pulmonary vascular congestion.  No acute abnormalities.   Electronically Signed   By: Lavonia Dana M.D.   On: 08/24/2013 23:11   Ct Head Wo Contrast  08/25/2013   CLINICAL DATA:  Loss of consciousness.  Right-sided weakness.  EXAM: CT HEAD WITHOUT CONTRAST  TECHNIQUE: Contiguous axial images were obtained from the base of the skull through the vertex without intravenous contrast.  COMPARISON:  None.  FINDINGS: There is no evidence of acute infarction, mass lesion, or intra- or extra-axial hemorrhage on CT.  The posterior fossa, including the cerebellum, brainstem and fourth ventricle, is within normal limits. The third and lateral ventricles, and basal ganglia are unremarkable in appearance. The cerebral hemispheres are symmetric in appearance, with normal gray-white differentiation. No mass effect or midline shift is seen.  There is no evidence of fracture; visualized osseous structures are unremarkable  in appearance. The orbits are within normal limits. The paranasal sinuses and mastoid air cells are well-aerated. No significant soft tissue abnormalities are seen.  IMPRESSION: Unremarkable noncontrast CT of the head.   Electronically Signed   By: Garald Balding M.D.   On: 08/25/2013 00:40   Ct Angio Chest Pe W/cm &/or Wo Cm  08/25/2013   CLINICAL DATA:  Shortness of breath; syncope. Concern for pulmonary embolus.  EXAM: CT ANGIOGRAPHY CHEST WITH CONTRAST  TECHNIQUE: Multidetector CT imaging of the chest was performed using the standard protocol during bolus administration of intravenous contrast. Multiplanar CT image reconstructions and MIPs were obtained to evaluate the vascular anatomy.  CONTRAST:  167mL OMNIPAQUE IOHEXOL 350 MG/ML SOLN  COMPARISON:  CTA of the chest performed 05/09/2011, and chest radiograph performed 08/24/2013  FINDINGS: There is no evidence of pulmonary embolus.  Mild bilateral atelectasis is noted. The lungs are otherwise clear. There is no evidence of significant focal consolidation, pleural effusion or pneumothorax. No masses are identified; no abnormal focal contrast enhancement  is seen.  The mediastinum is unremarkable in appearance. No mediastinal lymphadenopathy is seen. No pericardial effusion is identified. The great vessels are grossly unremarkable in appearance. No axillary lymphadenopathy is seen. The visualized portions of the thyroid gland are unremarkable in appearance.  The visualized portions of the liver and spleen are unremarkable.  No acute osseous abnormalities are seen.  Review of the MIP images confirms the above findings.  IMPRESSION: 1. No evidence of pulmonary embolus. 2. Mild bilateral atelectasis noted; lungs otherwise clear.   Electronically Signed   By: Garald Balding M.D.   On: 08/25/2013 01:53    Medications: Scheduled Meds: . amitriptyline  200 mg Oral QPM  . antiseptic oral rinse  15 mL Mouth Rinse BID  . aspirin EC  325 mg Oral Daily  .  budesonide-formoterol  2 puff Inhalation BID  . carvedilol  12.5 mg Oral BID WC  . Chlorhexidine Gluconate Cloth  6 each Topical Q0600  . enoxaparin (LOVENOX) injection  40 mg Subcutaneous Q24H  . gabapentin  1,800 mg Oral QHS  . gabapentin  1,200 mg Oral Daily  . hydrochlorothiazide  25 mg Oral Daily  . insulin aspart  0-9 Units Subcutaneous TID WC  . lisinopril  20 mg Oral Daily  . loratadine  10 mg Oral QHS  . mupirocin ointment  1 application Nasal BID  . pantoprazole  40 mg Oral Daily  . sertraline  200 mg Oral QHS  . sodium chloride  3 mL Intravenous Q12H  . sodium chloride  3 mL Intravenous Q12H  . tiotropium  18 mcg Inhalation Daily  . triamcinolone  2 spray Each Nare Daily      LOS: 3 days   Mattox Schorr Krystal Eaton M.D. [facility] 08/27/2013, 2:28 PM Pager: [phone]  If 7PM-7AM, please contact night-coverage [email or web address] Password [email or web address]  **Disclaimer: This note was dictated with voice recognition software. Similar sounding words can inadvertently be transcribed and this note may contain transcription errors which may not have been corrected upon publication of note.**

## 2013-08-28 DIAGNOSIS — J9610 Chronic respiratory failure, unspecified whether with hypoxia or hypercapnia: Secondary | ICD-10-CM | POA: Insufficient documentation

## 2013-08-28 LAB — GLUCOSE, CAPILLARY
GLUCOSE-CAPILLARY: 142 mg/dL — AB (ref 70–99)
Glucose-Capillary: 176 mg/dL — ABNORMAL HIGH (ref 70–99)

## 2013-08-28 MED ORDER — HYDROMORPHONE HCL PF 1 MG/ML IJ SOLN
1.0000 mg | Freq: Four times a day (QID) | INTRAMUSCULAR | Status: DC | PRN
  Administered 2013-08-28 – 2013-08-29 (×4): 1 mg via INTRAVENOUS
  Filled 2013-08-28 (×4): qty 1

## 2013-08-28 MED ORDER — GABAPENTIN 600 MG PO TABS
1800.0000 mg | ORAL_TABLET | Freq: Every day | ORAL | Status: DC
  Administered 2013-08-28 – 2013-08-29 (×2): 1800 mg via ORAL
  Filled 2013-08-28 (×3): qty 3

## 2013-08-28 NOTE — Progress Notes (Signed)
Patient Name: Denise Davis Date of Encounter: 08/28/2013     Principal Problem:   Chest pain Active Problems:   Diabetes mellitus   Hypertension   Syncope   Right sided weakness    SUBJECTIVE  No further chest pain.  Myoview stress test suggests possible reversible anteroseptal ischemia CURRENT MEDS . amitriptyline  200 mg Oral QPM  . antiseptic oral rinse  15 mL Mouth Rinse BID  . aspirin EC  325 mg Oral Daily  . budesonide-formoterol  2 puff Inhalation BID  . carvedilol  12.5 mg Oral BID WC  . Chlorhexidine Gluconate Cloth  6 each Topical Q0600  . enoxaparin (LOVENOX) injection  40 mg Subcutaneous Q24H  . gabapentin  1,800 mg Oral QHS  . gabapentin  1,200 mg Oral Daily  . hydrochlorothiazide  25 mg Oral Daily  . insulin aspart  0-9 Units Subcutaneous TID WC  . lisinopril  20 mg Oral Daily  . loratadine  10 mg Oral QHS  . mupirocin ointment  1 application Nasal BID  . pantoprazole  40 mg Oral Daily  . sertraline  200 mg Oral QHS  . sodium chloride  3 mL Intravenous Q12H  . sodium chloride  3 mL Intravenous Q12H  . tiotropium  18 mcg Inhalation Daily  . triamcinolone  2 spray Each Nare Daily    OBJECTIVE  Filed Vitals:   08/27/13 2217 08/28/13 0500 08/28/13 0855 08/28/13 0948  BP:  127/84 120/80   Pulse:  85 80   Temp:  98 F (36.7 C)    TempSrc:      Resp:  18    Height:      Weight:  377 lb 3.2 oz (171.097 kg)    SpO2: 98% 99%  99%    Intake/Output Summary (Last 24 hours) at 08/28/13 1037 Last data filed at 08/27/13 2209  Gross per 24 hour  Intake    939 ml  Output      0 ml  Net    939 ml   Filed Weights   08/25/13 0404 08/26/13 0712 08/28/13 0500  Weight: 378 lb 14.4 oz (171.868 kg) 377 lb 1.6 oz (171.051 kg) 377 lb 3.2 oz (171.097 kg)    PHYSICAL EXAM  General: Pleasant, NAD.  Significant obesity Neuro: Alert and oriented X 3. Moves all extremities spontaneously. Psych: Normal affect. HEENT:  Normal  Neck: Supple without bruits or  JVD. Lungs:  Resp regular and unlabored, CTA. Heart: RRR no s3, s4, or murmurs. Abdomen: Soft, non-tender, non-distended, BS + x 4.  Extremities: No clubbing, cyanosis or edema. DP/PT/Radials 2+ and equal bilaterally.  Accessory Clinical Findings  CBC No results found for this basename: WBC, NEUTROABS, HGB, HCT, MCV, PLT,  in the last 72 hours Basic Metabolic Panel No results found for this basename: NA, K, CL, CO2, GLUCOSE, BUN, CREATININE, CALCIUM, MG, PHOS,  in the last 72 hours Liver Function Tests No results found for this basename: AST, ALT, ALKPHOS, BILITOT, PROT, ALBUMIN,  in the last 72 hours No results found for this basename: LIPASE, AMYLASE,  in the last 72 hours Cardiac Enzymes  Recent Labs  08/25/13 1130 08/25/13 1532  TROPONINI <0.30 <0.30   BNP No components found with this basename: POCBNP,  D-Dimer No results found for this basename: DDIMER,  in the last 72 hours Hemoglobin A1C No results found for this basename: HGBA1C,  in the last 72 hours Fasting Lipid Panel No results found for this basename: CHOL, HDL,  LDLCALC, TRIG, CHOLHDL, LDLDIRECT,  in the last 72 hours Thyroid Function Tests No results found for this basename: TSH, T4TOTAL, FREET3, T3FREE, THYROIDAB,  in the last 72 hours  TELE  Normal sinus rhythm  ECG  Repeat EKG pending  Radiology/Studies  Dg Chest 2 View  08/24/2013   CLINICAL DATA:  Mid to RIGHT chest pain, shortness of breath, history hypertension, diabetes, renal cell carcinoma  EXAM: CHEST  2 VIEW  COMPARISON:  08/16/2012  FINDINGS: Enlargement of cardiac silhouette.  Slight pulmonary vascular congestion.  Mediastinal contours normal.  No acute infiltrate, pleural effusion, or pneumothorax.  Bones unremarkable.  IMPRESSION: Enlargement of cardiac silhouette with slight pulmonary vascular congestion.  No acute abnormalities.   Electronically Signed   By: Lavonia Dana M.D.   On: 08/24/2013 23:11   Ct Head Wo Contrast  08/25/2013    CLINICAL DATA:  Loss of consciousness.  Right-sided weakness.  EXAM: CT HEAD WITHOUT CONTRAST  TECHNIQUE: Contiguous axial images were obtained from the base of the skull through the vertex without intravenous contrast.  COMPARISON:  None.  FINDINGS: There is no evidence of acute infarction, mass lesion, or intra- or extra-axial hemorrhage on CT.  The posterior fossa, including the cerebellum, brainstem and fourth ventricle, is within normal limits. The third and lateral ventricles, and basal ganglia are unremarkable in appearance. The cerebral hemispheres are symmetric in appearance, with normal gray-white differentiation. No mass effect or midline shift is seen.  There is no evidence of fracture; visualized osseous structures are unremarkable in appearance. The orbits are within normal limits. The paranasal sinuses and mastoid air cells are well-aerated. No significant soft tissue abnormalities are seen.  IMPRESSION: Unremarkable noncontrast CT of the head.   Electronically Signed   By: Garald Balding M.D.   On: 08/25/2013 00:40   Ct Angio Chest Pe W/cm &/or Wo Cm  08/25/2013   CLINICAL DATA:  Shortness of breath; syncope. Concern for pulmonary embolus.  EXAM: CT ANGIOGRAPHY CHEST WITH CONTRAST  TECHNIQUE: Multidetector CT imaging of the chest was performed using the standard protocol during bolus administration of intravenous contrast. Multiplanar CT image reconstructions and MIPs were obtained to evaluate the vascular anatomy.  CONTRAST:  149mL OMNIPAQUE IOHEXOL 350 MG/ML SOLN  COMPARISON:  CTA of the chest performed 05/09/2011, and chest radiograph performed 08/24/2013  FINDINGS: There is no evidence of pulmonary embolus.  Mild bilateral atelectasis is noted. The lungs are otherwise clear. There is no evidence of significant focal consolidation, pleural effusion or pneumothorax. No masses are identified; no abnormal focal contrast enhancement is seen.  The mediastinum is unremarkable in appearance. No  mediastinal lymphadenopathy is seen. No pericardial effusion is identified. The great vessels are grossly unremarkable in appearance. No axillary lymphadenopathy is seen. The visualized portions of the thyroid gland are unremarkable in appearance.  The visualized portions of the liver and spleen are unremarkable.  No acute osseous abnormalities are seen.  Review of the MIP images confirms the above findings.  IMPRESSION: 1. No evidence of pulmonary embolus. 2. Mild bilateral atelectasis noted; lungs otherwise clear.   Electronically Signed   By: Garald Balding M.D.   On: 08/25/2013 01:53   Mr Brain Wo Contrast  08/25/2013   CLINICAL DATA:  Right-sided weakness. Numbness and tingling. Headache.  EXAM: MRI HEAD WITHOUT CONTRAST  TECHNIQUE: Multiplanar, multiecho pulse sequences of the brain and surrounding structures were obtained without intravenous contrast.  COMPARISON:  CT head without contrast 08/24/2013.  FINDINGS: No acute infarct, hemorrhage, or  mass lesion is present. A relatively empty sella is evident. The pituitary stalk is midline. The optic chiasm is unremarkable. Midline structures are otherwise within normal limits. The ventricles are of normal size. No significant extra-axial fluid collection is present.  Fluid is present in the major intracranial arteries. The globes and orbits are intact. The paranasal sinuses and mastoid air cells are clear.  IMPRESSION: 1. Relatively empty sella without evidence for pituitary pathology. 2. Otherwise normal MRI of the brain.   Electronically Signed   By: Lawrence Santiago M.D.   On: 08/25/2013 16:26   Nm Myocar Multi W/spect W/wall Motion / Ef  08/27/2013   CLINICAL DATA:  Chest pain, diabetes, hypertension. Shortness of breath.  EXAM: MYOCARDIAL IMAGING WITH SPECT (REST AND PHARMACOLOGIC-STRESS - 2 DAY PROTOCOL)  GATED LEFT VENTRICULAR WALL MOTION STUDY  LEFT VENTRICULAR EJECTION FRACTION  TECHNIQUE: Standard myocardial SPECT imaging was performed after resting  intravenous injection of 10 mCi Tc-46m sestamibi. Subsequently, on a second day, intravenous infusion of Lexiscan was performed under the supervision of the Cardiology staff. At peak effect of the drug, 30 mCi Tc-82m sestamibi was injected intravenously and standard myocardial SPECT imaging was performed. Quantitative gated imaging was also performed to evaluate left ventricular wall motion, and estimate left ventricular ejection fraction.  COMPARISON:  None  FINDINGS: Myocardial perfusion study: Abnormal drop in activity in the septal wall near the cardiac apex by 15% of activity on stress images compared to rest images, compatible with inducible ischemia.  Ejection fraction calculation: End-diastolic volume is 474 mL. End systolic volume is 51 mL. Derived left ventricular ejection fraction of 54%.  Wall motion analysis: Equivocal hypokinesis in the inferior wall base.  IMPRESSION: 1. Apicoseptal inducible ischemia. 2. Left ventricular ejection fraction 54%. These results will be called to the ordering clinician or representative by the Radiologist Assistant, and communication documented in the PACS or zVision Dashboard.   Electronically Signed   By: Sherryl Barters M.D.   On: 08/27/2013 13:36    ASSESSMENT AND PLAN  1.  Chest pain.  Apical septal inducible ischemia on Myoview.  Plan: We'll arrange for left heart cardiac catheterization on Monday, June 1. Add low-dose beta blocker Lopressor 12.5 mg twice a day  Signed, Darlin Coco MD

## 2013-08-28 NOTE — Progress Notes (Addendum)
PROGRESS NOTE  Denise Davis ESP:[id number] DOB: 11-Mar-1966 DOA: 08/24/2013 PCP: Marguerita Merles, MD  Assessment/Plan: Chest pain with syncopal episode:   -Patient was passenger in the car during the episode  - Cardiac enzymes negative so far, EKG showed no acute ST-T wave changes suggestive of ischemia  - CT angiogram negative for any PE.  - Cardiology was consulted, 2-D echo showed EF of 63-33%, grade 1 diastolic dysfunction  - Patient underwent 2-day stress test, which showed apical septal inducible ischemia, LVF 54% d -cath planned 6/1/5 Active Problems:  Right hemiparesis with sensory disturbance -Resolved.  -CT head negative for any acute CVA, patient's husband did not notice any seizure activity.  -Neurology was consulted and MRI of the brain was done which showed no acute CVA.  -Patient was placed on aspirin.  -MRI of the brain showed no acute CVA.  -Neurology was consulted and felt exam showing multiple inconsistencies and significant giveaway weakness did not recommend any further neurodiagnostic studies.  Chronic respiratory failure -Presently stable on home 2 L Chronic right knee pain -chronically wheelchair-bound  -Patient reports that she has been chronically having right knee pain, her primary care physician was recommending her to physical therapy.  -Patient was seen by PT and OT during hospitalization, recommended intermittent supervision home PT .  -recommended her to have her PCP refer patient to orthopedics for further management  Diabetes mellitus  - Continue sliding scale insulin  - Hold oral hypoglycemic medications during hospitalization -Check hemoglobin A1c  -am BMP Hypertension  - Currently stable   Family Communication:   Pt at beside Disposition Plan:   Home when medically stable        Procedures/Studies: Dg Chest 2 View  08/24/2013   CLINICAL DATA:  Mid to RIGHT chest pain, shortness of breath, history hypertension, diabetes,  renal cell carcinoma  EXAM: CHEST  2 VIEW  COMPARISON:  08/16/2012  FINDINGS: Enlargement of cardiac silhouette.  Slight pulmonary vascular congestion.  Mediastinal contours normal.  No acute infiltrate, pleural effusion, or pneumothorax.  Bones unremarkable.  IMPRESSION: Enlargement of cardiac silhouette with slight pulmonary vascular congestion.  No acute abnormalities.   Electronically Signed   By: Lavonia Dana M.D.   On: 08/24/2013 23:11   Ct Head Wo Contrast  08/25/2013   CLINICAL DATA:  Loss of consciousness.  Right-sided weakness.  EXAM: CT HEAD WITHOUT CONTRAST  TECHNIQUE: Contiguous axial images were obtained from the base of the skull through the vertex without intravenous contrast.  COMPARISON:  None.  FINDINGS: There is no evidence of acute infarction, mass lesion, or intra- or extra-axial hemorrhage on CT.  The posterior fossa, including the cerebellum, brainstem and fourth ventricle, is within normal limits. The third and lateral ventricles, and basal ganglia are unremarkable in appearance. The cerebral hemispheres are symmetric in appearance, with normal gray-white differentiation. No mass effect or midline shift is seen.  There is no evidence of fracture; visualized osseous structures are unremarkable in appearance. The orbits are within normal limits. The paranasal sinuses and mastoid air cells are well-aerated. No significant soft tissue abnormalities are seen.  IMPRESSION: Unremarkable noncontrast CT of the head.   Electronically Signed   By: Garald Balding M.D.   On: 08/25/2013 00:40   Ct Angio Chest Pe W/cm &/or Wo Cm  08/25/2013   CLINICAL DATA:  Shortness of breath; syncope. Concern for pulmonary embolus.  EXAM: CT ANGIOGRAPHY CHEST WITH CONTRAST  TECHNIQUE: Multidetector CT  imaging of the chest was performed using the standard protocol during bolus administration of intravenous contrast. Multiplanar CT image reconstructions and MIPs were obtained to evaluate the vascular anatomy.   CONTRAST:  148mL OMNIPAQUE IOHEXOL 350 MG/ML SOLN  COMPARISON:  CTA of the chest performed 05/09/2011, and chest radiograph performed 08/24/2013  FINDINGS: There is no evidence of pulmonary embolus.  Mild bilateral atelectasis is noted. The lungs are otherwise clear. There is no evidence of significant focal consolidation, pleural effusion or pneumothorax. No masses are identified; no abnormal focal contrast enhancement is seen.  The mediastinum is unremarkable in appearance. No mediastinal lymphadenopathy is seen. No pericardial effusion is identified. The great vessels are grossly unremarkable in appearance. No axillary lymphadenopathy is seen. The visualized portions of the thyroid gland are unremarkable in appearance.  The visualized portions of the liver and spleen are unremarkable.  No acute osseous abnormalities are seen.  Review of the MIP images confirms the above findings.  IMPRESSION: 1. No evidence of pulmonary embolus. 2. Mild bilateral atelectasis noted; lungs otherwise clear.   Electronically Signed   By: Garald Balding M.D.   On: 08/25/2013 01:53   Mr Brain Wo Contrast  08/25/2013   CLINICAL DATA:  Right-sided weakness. Numbness and tingling. Headache.  EXAM: MRI HEAD WITHOUT CONTRAST  TECHNIQUE: Multiplanar, multiecho pulse sequences of the brain and surrounding structures were obtained without intravenous contrast.  COMPARISON:  CT head without contrast 08/24/2013.  FINDINGS: No acute infarct, hemorrhage, or mass lesion is present. A relatively empty sella is evident. The pituitary stalk is midline. The optic chiasm is unremarkable. Midline structures are otherwise within normal limits. The ventricles are of normal size. No significant extra-axial fluid collection is present.  Fluid is present in the major intracranial arteries. The globes and orbits are intact. The paranasal sinuses and mastoid air cells are clear.  IMPRESSION: 1. Relatively empty sella without evidence for pituitary pathology.  2. Otherwise normal MRI of the brain.   Electronically Signed   By: Lawrence Santiago M.D.   On: 08/25/2013 16:26   Nm Myocar Multi W/spect W/wall Motion / Ef  08/27/2013   CLINICAL DATA:  Chest pain, diabetes, hypertension. Shortness of breath.  EXAM: MYOCARDIAL IMAGING WITH SPECT (REST AND PHARMACOLOGIC-STRESS - 2 DAY PROTOCOL)  GATED LEFT VENTRICULAR WALL MOTION STUDY  LEFT VENTRICULAR EJECTION FRACTION  TECHNIQUE: Standard myocardial SPECT imaging was performed after resting intravenous injection of 10 mCi Tc-17m sestamibi. Subsequently, on a second day, intravenous infusion of Lexiscan was performed under the supervision of the Cardiology staff. At peak effect of the drug, 30 mCi Tc-35m sestamibi was injected intravenously and standard myocardial SPECT imaging was performed. Quantitative gated imaging was also performed to evaluate left ventricular wall motion, and estimate left ventricular ejection fraction.  COMPARISON:  None  FINDINGS: Myocardial perfusion study: Abnormal drop in activity in the septal wall near the cardiac apex by 15% of activity on stress images compared to rest images, compatible with inducible ischemia.  Ejection fraction calculation: End-diastolic volume is 387 mL. End systolic volume is 51 mL. Derived left ventricular ejection fraction of 54%.  Wall motion analysis: Equivocal hypokinesis in the inferior wall base.  IMPRESSION: 1. Apicoseptal inducible ischemia. 2. Left ventricular ejection fraction 54%. These results will be called to the ordering clinician or representative by the Radiologist Assistant, and communication documented in the PACS or zVision Dashboard.   Electronically Signed   By: Sherryl Barters M.D.   On: 08/27/2013 13:36  Subjective: Patient is complaining of chest pain and knee pain that is keeping her up at night. She requested to hydromorphone for nighttime. She denies any fevers, chills, shortness of breath, nausea, vomiting, diarrhea, abdominal  pain. She continues to have intermittent chest discomfort.  Objective: Filed Vitals:   08/27/13 2217 08/28/13 0500 08/28/13 0855 08/28/13 0948  BP:  127/84 120/80   Pulse:  85 80   Temp:  98 F (36.7 C)    TempSrc:      Resp:  18    Height:      Weight:  171.097 kg (377 lb 3.2 oz)    SpO2: 98% 99%  99%    Intake/Output Summary (Last 24 hours) at 08/28/13 1212 Last data filed at 08/27/13 2209  Gross per 24 hour  Intake    939 ml  Output      0 ml  Net    939 ml   Weight change: 0.045 kg (1.6 oz) Exam:   General:  Pt is alert, follows commands appropriately, not in acute distress  HEENT: No icterus, No thrush,  [place]/AT  Cardiovascular: RRR, S1/S2, no rubs, no gallops  Respiratory: CTA bilaterally, no wheezing, no crackles, no rhonchi  Abdomen: Soft/+BS, non tender, non distended, no guarding  Extremities: 2+ edema, No lymphangitis, No petechiae, No rashes, no synovitis  Data Reviewed: Basic Metabolic Panel:  Recent Labs Lab 08/24/13 2229  NA 136*  K 4.4  CL 98  CO2 24  GLUCOSE 176*  BUN 14  CREATININE 0.88  CALCIUM 9.0   Liver Function Tests:  Recent Labs Lab 08/24/13 2229  AST 15  ALT 15  ALKPHOS 88  BILITOT <0.2*  PROT 7.6  ALBUMIN 3.3*   No results found for this basename: LIPASE, AMYLASE,  in the last 168 hours No results found for this basename: AMMONIA,  in the last 168 hours CBC:  Recent Labs Lab 08/24/13 2229  WBC 5.6  NEUTROABS 2.8  HGB 10.5*  HCT 32.4*  MCV 88.8  PLT 277   Cardiac Enzymes:  Recent Labs Lab 08/25/13 1130 08/25/13 1532  TROPONINI <0.30 <0.30   BNP: No components found with this basename: POCBNP,  CBG:  Recent Labs Lab 08/26/13 2003 08/27/13 0725 08/27/13 1657 08/27/13 2109 08/28/13 0737  GLUCAP 159* 127* 124* 165* 142*    Recent Results (from the past 240 hour(s))  MRSA PCR SCREENING     Status: Abnormal   Collection Time    08/25/13  4:42 AM      Result Value Ref Range Status   MRSA by PCR  POSITIVE (*) NEGATIVE Final   Comment:            The GeneXpert MRSA Assay (FDA     approved for NASAL specimens     only), is one component of a     comprehensive MRSA colonization     surveillance program. It is not     intended to diagnose MRSA     infection nor to guide or     monitor treatment for     MRSA infections.     RESULT CALLED TO, READ BACK BY AND VERIFIED WITH:     T.DORMON RN [id number] 08/25/13 E.GADDY     Scheduled Meds: . amitriptyline  200 mg Oral QPM  . antiseptic oral rinse  15 mL Mouth Rinse BID  . aspirin EC  325 mg Oral Daily  . budesonide-formoterol  2 puff Inhalation BID  . carvedilol  12.5 mg  Oral BID WC  . Chlorhexidine Gluconate Cloth  6 each Topical Q0600  . enoxaparin (LOVENOX) injection  40 mg Subcutaneous Q24H  . gabapentin  1,800 mg Oral QHS  . gabapentin  1,200 mg Oral Daily  . hydrochlorothiazide  25 mg Oral Daily  . insulin aspart  0-9 Units Subcutaneous TID WC  . lisinopril  20 mg Oral Daily  . loratadine  10 mg Oral QHS  . mupirocin ointment  1 application Nasal BID  . pantoprazole  40 mg Oral Daily  . sertraline  200 mg Oral QHS  . sodium chloride  3 mL Intravenous Q12H  . sodium chloride  3 mL Intravenous Q12H  . tiotropium  18 mcg Inhalation Daily  . triamcinolone  2 spray Each Nare Daily   Continuous Infusions:    Geraldo Docker  [facility] Pager [phone]  If 7PM-7AM, please contact night-coverage [email or web address] Password [email or web address] 08/28/2013, 12:12 PM   LOS: 4 days

## 2013-08-29 LAB — BASIC METABOLIC PANEL
BUN: 24 mg/dL — AB (ref 6–23)
CO2: 29 mEq/L (ref 19–32)
CREATININE: 1.08 mg/dL (ref 0.50–1.10)
Calcium: 9.2 mg/dL (ref 8.4–10.5)
Chloride: 95 mEq/L — ABNORMAL LOW (ref 96–112)
GFR calc Af Amer: 70 mL/min — ABNORMAL LOW (ref >90)
GFR, EST NON AFRICAN AMERICAN: 60 mL/min — AB (ref >90)
GLUCOSE: 168 mg/dL — AB (ref 70–99)
POTASSIUM: 4.6 meq/L (ref 3.7–5.3)
Sodium: 135 mEq/L — ABNORMAL LOW (ref 137–147)

## 2013-08-29 LAB — GLUCOSE, CAPILLARY
GLUCOSE-CAPILLARY: 160 mg/dL — AB (ref 70–99)
GLUCOSE-CAPILLARY: 183 mg/dL — AB (ref 70–99)
Glucose-Capillary: 119 mg/dL — ABNORMAL HIGH (ref 70–99)
Glucose-Capillary: 144 mg/dL — ABNORMAL HIGH (ref 70–99)
Glucose-Capillary: 192 mg/dL — ABNORMAL HIGH (ref 70–99)

## 2013-08-29 LAB — HEMOGLOBIN A1C
Hgb A1c MFr Bld: 7.6 % — ABNORMAL HIGH (ref <5.7)
Mean Plasma Glucose: 171 mg/dL — ABNORMAL HIGH (ref <117)

## 2013-08-29 LAB — PROTIME-INR
INR: 1.02 (ref 0.00–1.49)
PROTHROMBIN TIME: 13.2 s (ref 11.6–15.2)

## 2013-08-29 MED ORDER — SODIUM CHLORIDE 0.9 % IV SOLN: 250.0000 mL | INTRAVENOUS | Status: DC | PRN

## 2013-08-29 MED ORDER — HYDROMORPHONE HCL PF 1 MG/ML IJ SOLN
0.5000 mg | Freq: Once | INTRAMUSCULAR | Status: AC
  Administered 2013-08-29: 0.5 mg via INTRAVENOUS
  Filled 2013-08-29: qty 1

## 2013-08-29 MED ORDER — SODIUM CHLORIDE 0.9 % IJ SOLN: 3.0000 mL | INTRAMUSCULAR | Status: DC | PRN

## 2013-08-29 MED ORDER — SODIUM CHLORIDE 0.9 % IJ SOLN
3.0000 mL | Freq: Two times a day (BID) | INTRAMUSCULAR | Status: DC
  Administered 2013-08-29 (×2): 3 mL via INTRAVENOUS

## 2013-08-29 MED ORDER — ENOXAPARIN SODIUM 80 MG/0.8ML ~~LOC~~ SOLN: 80.0000 mg | SUBCUTANEOUS | Status: DC

## 2013-08-29 MED ORDER — GI COCKTAIL ~~LOC~~
30.0000 mL | Freq: Once | ORAL | Status: AC
  Administered 2013-08-29: 30 mL via ORAL
  Filled 2013-08-29: qty 30

## 2013-08-29 MED ORDER — ASPIRIN 81 MG PO CHEW
81.0000 mg | CHEWABLE_TABLET | ORAL | Status: AC
  Administered 2013-08-30: 81 mg via ORAL
  Filled 2013-08-29: qty 1

## 2013-08-29 MED ORDER — ENOXAPARIN SODIUM 40 MG/0.4ML ~~LOC~~ SOLN
40.0000 mg | SUBCUTANEOUS | Status: AC
  Administered 2013-08-29: 40 mg via SUBCUTANEOUS
  Filled 2013-08-29: qty 0.4

## 2013-08-29 NOTE — Progress Notes (Signed)
PROGRESS NOTE  Denise Davis ZOX:[id number] DOB: May 10, 1965 DOA: 08/24/2013 PCP: Marguerita Merles, MD  Assessment/Plan: Chest pain with syncopal episode:  -Patient was passenger in the car during the episode  - Cardiac enzymes negative so far, EKG showed no acute ST-T wave changes suggestive of ischemia  - CT angiogram negative for any PE.  - Cardiology was consulted, 2-D echo showed EF of 81-19%, grade 1 diastolic dysfunction  - Patient underwent 2-day stress test, which showed apical septal inducible ischemia, LVF 54%  -cath planned 08/30/13    Right hemiparesis with sensory disturbance  -Resolved.  -CT head negative for any acute CVA, patient's husband did not notice any seizure activity.  -Neurology was consulted and MRI of the brain was done which showed no acute CVA.  -Patient was placed on aspirin.  -MRI of the brain showed no acute CVA.  -Neurology was consulted and felt exam showing multiple inconsistencies and significant giveaway weakness did not recommend any further neurodiagnostic studies.  Chronic respiratory failure  -Presently stable on home 2 L  Chronic right knee pain  -chronically wheelchair-bound  -Patient reports that she has been chronically having right knee pain, her primary care physician was recommending her to physical therapy.  -Patient was seen by PT and OT during hospitalization, recommended intermittent supervision home PT .  -recommended her to have her PCP refer patient to orthopedics for further management  Diabetes mellitus  - Continue sliding scale insulin  - Hold oral hypoglycemic medications during hospitalization  -Check hemoglobin A1c  -am BMP  Hypertension  - Currently stable  Family Communication: husband at beside  Disposition Plan: Home when medically stable          Procedures/Studies: Dg Chest 2 View  08/24/2013   CLINICAL DATA:  Mid to RIGHT chest pain, shortness of breath, history hypertension, diabetes, renal  cell carcinoma  EXAM: CHEST  2 VIEW  COMPARISON:  08/16/2012  FINDINGS: Enlargement of cardiac silhouette.  Slight pulmonary vascular congestion.  Mediastinal contours normal.  No acute infiltrate, pleural effusion, or pneumothorax.  Bones unremarkable.  IMPRESSION: Enlargement of cardiac silhouette with slight pulmonary vascular congestion.  No acute abnormalities.   Electronically Signed   By: Lavonia Dana M.D.   On: 08/24/2013 23:11   Ct Head Wo Contrast  08/25/2013   CLINICAL DATA:  Loss of consciousness.  Right-sided weakness.  EXAM: CT HEAD WITHOUT CONTRAST  TECHNIQUE: Contiguous axial images were obtained from the base of the skull through the vertex without intravenous contrast.  COMPARISON:  None.  FINDINGS: There is no evidence of acute infarction, mass lesion, or intra- or extra-axial hemorrhage on CT.  The posterior fossa, including the cerebellum, brainstem and fourth ventricle, is within normal limits. The third and lateral ventricles, and basal ganglia are unremarkable in appearance. The cerebral hemispheres are symmetric in appearance, with normal gray-white differentiation. No mass effect or midline shift is seen.  There is no evidence of fracture; visualized osseous structures are unremarkable in appearance. The orbits are within normal limits. The paranasal sinuses and mastoid air cells are well-aerated. No significant soft tissue abnormalities are seen.  IMPRESSION: Unremarkable noncontrast CT of the head.   Electronically Signed   By: Garald Balding M.D.   On: 08/25/2013 00:40   Ct Angio Chest Pe W/cm &/or Wo Cm  08/25/2013   CLINICAL DATA:  Shortness of breath; syncope. Concern for pulmonary embolus.  EXAM: CT ANGIOGRAPHY CHEST WITH CONTRAST  TECHNIQUE: Multidetector CT imaging of the chest was performed using the standard protocol during bolus administration of intravenous contrast. Multiplanar CT image reconstructions and MIPs were obtained to evaluate the vascular anatomy.  CONTRAST:   162mL OMNIPAQUE IOHEXOL 350 MG/ML SOLN  COMPARISON:  CTA of the chest performed 05/09/2011, and chest radiograph performed 08/24/2013  FINDINGS: There is no evidence of pulmonary embolus.  Mild bilateral atelectasis is noted. The lungs are otherwise clear. There is no evidence of significant focal consolidation, pleural effusion or pneumothorax. No masses are identified; no abnormal focal contrast enhancement is seen.  The mediastinum is unremarkable in appearance. No mediastinal lymphadenopathy is seen. No pericardial effusion is identified. The great vessels are grossly unremarkable in appearance. No axillary lymphadenopathy is seen. The visualized portions of the thyroid gland are unremarkable in appearance.  The visualized portions of the liver and spleen are unremarkable.  No acute osseous abnormalities are seen.  Review of the MIP images confirms the above findings.  IMPRESSION: 1. No evidence of pulmonary embolus. 2. Mild bilateral atelectasis noted; lungs otherwise clear.   Electronically Signed   By: Garald Balding M.D.   On: 08/25/2013 01:53   Mr Brain Wo Contrast  08/25/2013   CLINICAL DATA:  Right-sided weakness. Numbness and tingling. Headache.  EXAM: MRI HEAD WITHOUT CONTRAST  TECHNIQUE: Multiplanar, multiecho pulse sequences of the brain and surrounding structures were obtained without intravenous contrast.  COMPARISON:  CT head without contrast 08/24/2013.  FINDINGS: No acute infarct, hemorrhage, or mass lesion is present. A relatively empty sella is evident. The pituitary stalk is midline. The optic chiasm is unremarkable. Midline structures are otherwise within normal limits. The ventricles are of normal size. No significant extra-axial fluid collection is present.  Fluid is present in the major intracranial arteries. The globes and orbits are intact. The paranasal sinuses and mastoid air cells are clear.  IMPRESSION: 1. Relatively empty sella without evidence for pituitary pathology. 2.  Otherwise normal MRI of the brain.   Electronically Signed   By: Lawrence Santiago M.D.   On: 08/25/2013 16:26   Nm Myocar Multi W/spect W/wall Motion / Ef  08/27/2013   CLINICAL DATA:  Chest pain, diabetes, hypertension. Shortness of breath.  EXAM: MYOCARDIAL IMAGING WITH SPECT (REST AND PHARMACOLOGIC-STRESS - 2 DAY PROTOCOL)  GATED LEFT VENTRICULAR WALL MOTION STUDY  LEFT VENTRICULAR EJECTION FRACTION  TECHNIQUE: Standard myocardial SPECT imaging was performed after resting intravenous injection of 10 mCi Tc-75m sestamibi. Subsequently, on a second day, intravenous infusion of Lexiscan was performed under the supervision of the Cardiology staff. At peak effect of the drug, 30 mCi Tc-55m sestamibi was injected intravenously and standard myocardial SPECT imaging was performed. Quantitative gated imaging was also performed to evaluate left ventricular wall motion, and estimate left ventricular ejection fraction.  COMPARISON:  None  FINDINGS: Myocardial perfusion study: Abnormal drop in activity in the septal wall near the cardiac apex by 15% of activity on stress images compared to rest images, compatible with inducible ischemia.  Ejection fraction calculation: End-diastolic volume is A999333 mL. End systolic volume is 51 mL. Derived left ventricular ejection fraction of 54%.  Wall motion analysis: Equivocal hypokinesis in the inferior wall base.  IMPRESSION: 1. Apicoseptal inducible ischemia. 2. Left ventricular ejection fraction 54%. These results will be called to the ordering clinician or representative by the Radiologist Assistant, and communication documented in the PACS or zVision Dashboard.   Electronically Signed   By: Sherryl Barters M.D.   On: 08/27/2013 13:36  Subjective: Patient continues to complain of intermittent chest pain. Denies any nausea, vomiting, diarrhea, abdominal pain, dysuria, hematuria. No rashes. No headaches. No dizziness.  Objective: Filed Vitals:   08/29/13 0929  08/29/13 1101 08/29/13 1445 08/29/13 1500  BP:   124/75   Pulse: 85  96   Temp:   98.4 F (36.9 C)   TempSrc:   Oral   Resp:   20   Height:    5\' 5"  (1.651 m)  Weight:    160.891 kg (354 lb 11.2 oz)  SpO2:  100% 98%     Intake/Output Summary (Last 24 hours) at 08/29/13 1818 Last data filed at 08/29/13 1450  Gross per 24 hour  Intake    480 ml  Output      0 ml  Net    480 ml   Weight change: -10.206 kg (-22 lb 8 oz) Exam:   General:  Pt is alert, follows commands appropriately, not in acute distress  HEENT: No icterus, No thrush,[place]/AT  Cardiovascular: RRR, S1/S2, no rubs, no gallops  Respiratory: CTA bilaterally, no wheezing, no crackles, no rhonchi  Abdomen: Soft/+BS, non tender, non distended, no guarding  Extremities: 2+LE edema, No lymphangitis, No petechiae, No rashes, no synovitis  Data Reviewed: Basic Metabolic Panel:  Recent Labs Lab 08/24/13 2229 08/29/13 0535  NA 136* 135*  K 4.4 4.6  CL 98 95*  CO2 24 29  GLUCOSE 176* 168*  BUN 14 24*  CREATININE 0.88 1.08  CALCIUM 9.0 9.2   Liver Function Tests:  Recent Labs Lab 08/24/13 2229  AST 15  ALT 15  ALKPHOS 88  BILITOT <0.2*  PROT 7.6  ALBUMIN 3.3*   No results found for this basename: LIPASE, AMYLASE,  in the last 168 hours No results found for this basename: AMMONIA,  in the last 168 hours CBC:  Recent Labs Lab 08/24/13 2229  WBC 5.6  NEUTROABS 2.8  HGB 10.5*  HCT 32.4*  MCV 88.8  PLT 277   Cardiac Enzymes:  Recent Labs Lab 08/25/13 1130 08/25/13 1532  TROPONINI <0.30 <0.30   BNP: No components found with this basename: POCBNP,  CBG:  Recent Labs Lab 08/28/13 1145 08/28/13 2142 08/29/13 0733 08/29/13 1136 08/29/13 1621  GLUCAP 119* 176* 144* 160* 192*    Recent Results (from the past 240 hour(s))  MRSA PCR SCREENING     Status: Abnormal   Collection Time    08/25/13  4:42 AM      Result Value Ref Range Status   MRSA by PCR POSITIVE (*) NEGATIVE Final    Comment:            The GeneXpert MRSA Assay (FDA     approved for NASAL specimens     only), is one component of a     comprehensive MRSA colonization     surveillance program. It is not     intended to diagnose MRSA     infection nor to guide or     monitor treatment for     MRSA infections.     RESULT CALLED TO, READ BACK BY AND VERIFIED WITH:     T.DORMON RN [id number] 08/25/13 E.GADDY     Scheduled Meds: . amitriptyline  200 mg Oral QPM  . antiseptic oral rinse  15 mL Mouth Rinse BID  . [START ON 08/30/2013] aspirin  81 mg Oral Pre-Cath  . aspirin EC  325 mg Oral Daily  . budesonide-formoterol  2 puff Inhalation BID  .  carvedilol  12.5 mg Oral BID WC  . Chlorhexidine Gluconate Cloth  6 each Topical Q0600  . enoxaparin (LOVENOX) injection  40 mg Subcutaneous NOW  . [START ON 08/30/2013] enoxaparin (LOVENOX) injection  80 mg Subcutaneous Q24H  . gabapentin  1,200 mg Oral Daily  . gabapentin  1,800 mg Oral QHS  . hydrochlorothiazide  25 mg Oral Daily  . insulin aspart  0-9 Units Subcutaneous TID WC  . lisinopril  20 mg Oral Daily  . loratadine  10 mg Oral QHS  . mupirocin ointment  1 application Nasal BID  . pantoprazole  40 mg Oral Daily  . sertraline  200 mg Oral QHS  . sodium chloride  3 mL Intravenous Q12H  . sodium chloride  3 mL Intravenous Q12H  . sodium chloride  3 mL Intravenous Q12H  . tiotropium  18 mcg Inhalation Daily  . triamcinolone  2 spray Each Nare Daily   Continuous Infusions:    Geraldo Docker  [facility] Pager [phone]  If 7PM-7AM, please contact night-coverage [email or web address] Password [email or web address] 08/29/2013, 6:18 PM   LOS: 5 days

## 2013-08-29 NOTE — Progress Notes (Signed)
ANTICOAGULATION CONSULT NOTE - Initial Consult  Pharmacy Consult for Lovenox Indication: DVT prophylaxis in obese pt (wt >150 kg)  Allergies  Allergen Reactions  . Milk-Related Compounds     Diarrhea and vomiting  "only milk causes problems. Milk products are ok"    Patient Measurements: Height: 5\' 5"  (165.1 cm) Weight: 354 lb 11.2 oz (160.891 kg) (taken from earlier today 08/29/13) IBW/kg (Calculated) : 57   Vital Signs: Temp: 98.4 F (36.9 C) (05/31 1445) Temp src: Oral (05/31 1445) BP: 124/75 mmHg (05/31 1445) Pulse Rate: 96 (05/31 1445)  Labs:  Recent Labs  08/29/13 0535  CREATININE 1.08    Estimated Creatinine Clearance: 100.2 ml/min (by C-G formula based on Cr of 1.08).   Medical History: Past Medical History  Diagnosis Date  . Diabetes mellitus   . Hypertension   . DVT of leg (deep venous thrombosis) 12/2007    left leg, given warfarin for 6 mos  . Morbid obesity   . Renal cell cancer 2009    S/p nephrectomy (left)  . Pericarditis     Diagnosed/treated at [facility]; per patient 4 episodes per cardiologist Dr. Edwin Dada persistant chest pain unclear if pericarditis verusus neuropsychogenic   . H/O hiatal hernia   . Anxiety   . Shortness of breath     Medications:  Prescriptions prior to admission  Medication Sig Dispense Refill  . amitriptyline (ELAVIL) 50 MG tablet Take 200 mg by mouth every evening. Take 2-3 hours prior to bedtime      . aspirin EC 81 MG tablet Take 81 mg by mouth daily.      . budesonide-formoterol (SYMBICORT) 160-4.5 MCG/ACT inhaler Inhale 2 puffs into the lungs 2 (two) times daily.      . carvedilol (COREG) 12.5 MG tablet Take 12.5 mg by mouth 2 (two) times daily with a meal.      . diazepam (VALIUM) 10 MG tablet Take 10 mg by mouth 3 (three) times daily as needed for anxiety.      Marland Kitchen esomeprazole (NEXIUM) 40 MG capsule Take 40 mg by mouth daily.       Marland Kitchen gabapentin (NEURONTIN) 300 MG capsule Take 1,800 mg by mouth at bedtime.      .  gabapentin (NEURONTIN) 600 MG tablet Take 1,200 mg by mouth daily.       Marland Kitchen glipiZIDE (GLUCOTROL) 5 MG tablet Take 5 mg by mouth 2 (two) times daily before a meal.       . hydrochlorothiazide (HYDRODIURIL) 25 MG tablet Take 25 mg by mouth daily.      Marland Kitchen ibuprofen (ADVIL,MOTRIN) 800 MG tablet Take 800 mg by mouth every 4 (four) hours as needed (pain).      Marland Kitchen lisinopril (PRINIVIL,ZESTRIL) 20 MG tablet Take 20 mg by mouth daily.      Marland Kitchen loratadine (CLARITIN) 10 MG tablet Take 10 mg by mouth at bedtime.      . metFORMIN (GLUCOPHAGE) 1000 MG tablet Take 1,000 mg by mouth 2 (two) times daily with a meal.      . nitroGLYCERIN (NITROSTAT) 0.4 MG SL tablet Place 0.4 mg under the tongue every 5 (five) minutes as needed for chest pain. (may repeat every 5 minutes but seek medical help if pain persists after 3 tablets)      . promethazine (PHENERGAN) 25 MG tablet Take 25 mg by mouth 4 (four) times daily as needed for nausea or vomiting.      . sertraline (ZOLOFT) 100 MG tablet Take 200 mg  by mouth at bedtime.       Marland Kitchen tiotropium (SPIRIVA) 18 MCG inhalation capsule Place 18 mcg into inhaler and inhale daily.      Marland Kitchen triamcinolone (NASACORT AQ) 55 MCG/ACT AERO nasal inhaler Place 2 sprays into both nostrils daily.       . [DISCONTINUED] HYDROcodone-acetaminophen (NORCO/VICODIN) 5-325 MG per tablet Take 2 tablets by mouth at bedtime as needed for moderate pain.       Scheduled:  . amitriptyline  200 mg Oral QPM  . antiseptic oral rinse  15 mL Mouth Rinse BID  . [START ON 08/30/2013] aspirin  81 mg Oral Pre-Cath  . aspirin EC  325 mg Oral Daily  . budesonide-formoterol  2 puff Inhalation BID  . carvedilol  12.5 mg Oral BID WC  . Chlorhexidine Gluconate Cloth  6 each Topical Q0600  . enoxaparin (LOVENOX) injection  40 mg Subcutaneous Q24H  . gabapentin  1,200 mg Oral Daily  . gabapentin  1,800 mg Oral QHS  . hydrochlorothiazide  25 mg Oral Daily  . insulin aspart  0-9 Units Subcutaneous TID WC  . lisinopril  20  mg Oral Daily  . loratadine  10 mg Oral QHS  . mupirocin ointment  1 application Nasal BID  . pantoprazole  40 mg Oral Daily  . sertraline  200 mg Oral QHS  . sodium chloride  3 mL Intravenous Q12H  . sodium chloride  3 mL Intravenous Q12H  . sodium chloride  3 mL Intravenous Q12H  . tiotropium  18 mcg Inhalation Daily  . triamcinolone  2 spray Each Nare Daily    Assessment: 48 y.o obese female on Lovenox 40 mg SQ q24h for DVT prophylaxis. Noted to have a h/o DVT left leg (took coumadin x 6 months; not on anticoagulation prior to this admission) . Lovenox orders indicate that pharmacy may adjust dose for DVT prophylaxis.   SCr = 1.08, CrCl ~ 100 ml/min.  On admit date 08/24/13 the H/H = 10.5/32.4 and pltc = 277. No bleeding noted. This patient is obese and her BMI is 59.1.  The  DVT prophylaxis lovenox dosage recommendation is to increase to 0.5 mg/kg q24h in patients with BMI > 30.   CTA 5/27 negative for PE CT head 5/27 no acute infarct or hemorrhage MRI brain 5/27= normal / no acute infarct or hemorrhage.   Lovenox 40mg  SQ given today at 0930  Goal of Therapy:  4 hr heparin level = 0.3-0.6 units/ml Monitor platelets by anticoagulation protocol: Yes   Plan:  Increase lovenox dose to 80mg  SQ q24h (0.5mg /kg q24h) for DVT prophylaxis.. Will give extra lovenox 40mg  x 1 now for total of 80mg  today.  Monitor CBC ~ q72h Hold lovenox dose tomorrow AM as pt is schedule for cardiac cath.   Nicole Cella, RPh Clinical Pharmacist Pager: [phone] 08/29/2013,4:28 PM

## 2013-08-29 NOTE — Progress Notes (Signed)
 Patient Name: Denise Davis Date of Encounter: 08/29/2013     Principal Problem:   Chest pain Active Problems:   Diabetes mellitus   Hypertension   Anxiety state, unspecified   Syncope   Right sided weakness   Chronic respiratory failure    SUBJECTIVE  The patient continues to have mild chronic substernal discomfort.  She does not appear to be in any acute distress. Scheduled for cardiac catheterization tomorrow because of apical septal inducible ischemia seen on Myoview. CURRENT MEDS . amitriptyline  200 mg Oral QPM  . antiseptic oral rinse  15 mL Mouth Rinse BID  . [START ON 08/30/2013] aspirin  81 mg Oral Pre-Cath  . aspirin EC  325 mg Oral Daily  . budesonide-formoterol  2 puff Inhalation BID  . carvedilol  12.5 mg Oral BID WC  . Chlorhexidine Gluconate Cloth  6 each Topical Q0600  . enoxaparin (LOVENOX) injection  40 mg Subcutaneous Q24H  . gabapentin  1,200 mg Oral Daily  . gabapentin  1,800 mg Oral QHS  . hydrochlorothiazide  25 mg Oral Daily  . insulin aspart  0-9 Units Subcutaneous TID WC  . lisinopril  20 mg Oral Daily  . loratadine  10 mg Oral QHS  . mupirocin ointment  1 application Nasal BID  . pantoprazole  40 mg Oral Daily  . sertraline  200 mg Oral QHS  . sodium chloride  3 mL Intravenous Q12H  . sodium chloride  3 mL Intravenous Q12H  . sodium chloride  3 mL Intravenous Q12H  . tiotropium  18 mcg Inhalation Daily  . triamcinolone  2 spray Each Nare Daily    OBJECTIVE  Filed Vitals:   08/29/13 0620 08/29/13 0928 08/29/13 0929 08/29/13 1101  BP: 116/69 120/70    Pulse: 90  85   Temp: 98 F (36.7 C)     TempSrc: Oral     Resp: 18     Height:      Weight: 354 lb 11.2 oz (160.891 kg)     SpO2: 100%   100%    Intake/Output Summary (Last 24 hours) at 08/29/13 1136 Last data filed at 08/29/13 0945  Gross per 24 hour  Intake    240 ml  Output      0 ml  Net    240 ml   Filed Weights   08/26/13 0712 08/28/13 0500 08/29/13 0620  Weight:  377 lb 1.6 oz (171.051 kg) 377 lb 3.2 oz (171.097 kg) 354 lb 11.2 oz (160.891 kg)    PHYSICAL EXAM  General: Pleasant, NAD. Significant obesity  Neuro: Alert and oriented X 3. Moves all extremities spontaneously.  Psych: Normal affect.  HEENT: Normal  Neck: Supple without bruits or JVD.  Lungs: Resp regular and unlabored, CTA.  Heart: RRR no s3, s4, or murmurs.  Abdomen: Soft, non-tender, non-distended, BS + x 4.  Extremities: No clubbing, cyanosis or edema. DP/PT/Radials 2+ and equal bilaterally.  Accessory Clinical Findings  CBC No results found for this basename: WBC, NEUTROABS, HGB, HCT, MCV, PLT,  in the last 72 hours Basic Metabolic Panel  Recent Labs  08/29/13 0535  NA 135*  K 4.6  CL 95*  CO2 29  GLUCOSE 168*  BUN 24*  CREATININE 1.08  CALCIUM 9.2   Liver Function Tests No results found for this basename: AST, ALT, ALKPHOS, BILITOT, PROT, ALBUMIN,  in the last 72 hours No results found for this basename: LIPASE, AMYLASE,  in the last 72 hours Cardiac   Enzymes No results found for this basename: CKTOTAL, CKMB, CKMBINDEX, TROPONINI,  in the last 72 hours BNP No components found with this basename: POCBNP,  D-Dimer No results found for this basename: DDIMER,  in the last 72 hours Hemoglobin A1C No results found for this basename: HGBA1C,  in the last 72 hours Fasting Lipid Panel No results found for this basename: CHOL, HDL, LDLCALC, TRIG, CHOLHDL, LDLDIRECT,  in the last 72 hours Thyroid Function Tests No results found for this basename: TSH, T4TOTAL, FREET3, T3FREE, THYROIDAB,  in the last 72 hours  TELE  Normal sinus rhythm  ECG    Radiology/Studies  Dg Chest 2 View  08/24/2013   CLINICAL DATA:  Mid to RIGHT chest pain, shortness of breath, history hypertension, diabetes, renal cell carcinoma  EXAM: CHEST  2 VIEW  COMPARISON:  08/16/2012  FINDINGS: Enlargement of cardiac silhouette.  Slight pulmonary vascular congestion.  Mediastinal contours normal.   No acute infiltrate, pleural effusion, or pneumothorax.  Bones unremarkable.  IMPRESSION: Enlargement of cardiac silhouette with slight pulmonary vascular congestion.  No acute abnormalities.   Electronically Signed   By: Mark  Boles M.D.   On: 08/24/2013 23:11   Ct Head Wo Contrast  08/25/2013   CLINICAL DATA:  Loss of consciousness.  Right-sided weakness.  EXAM: CT HEAD WITHOUT CONTRAST  TECHNIQUE: Contiguous axial images were obtained from the base of the skull through the vertex without intravenous contrast.  COMPARISON:  None.  FINDINGS: There is no evidence of acute infarction, mass lesion, or intra- or extra-axial hemorrhage on CT.  The posterior fossa, including the cerebellum, brainstem and fourth ventricle, is within normal limits. The third and lateral ventricles, and basal ganglia are unremarkable in appearance. The cerebral hemispheres are symmetric in appearance, with normal gray-white differentiation. No mass effect or midline shift is seen.  There is no evidence of fracture; visualized osseous structures are unremarkable in appearance. The orbits are within normal limits. The paranasal sinuses and mastoid air cells are well-aerated. No significant soft tissue abnormalities are seen.  IMPRESSION: Unremarkable noncontrast CT of the head.   Electronically Signed   By: Jeffery  Chang M.D.   On: 08/25/2013 00:40   Ct Angio Chest Pe W/cm &/or Wo Cm  08/25/2013   CLINICAL DATA:  Shortness of breath; syncope. Concern for pulmonary embolus.  EXAM: CT ANGIOGRAPHY CHEST WITH CONTRAST  TECHNIQUE: Multidetector CT imaging of the chest was performed using the standard protocol during bolus administration of intravenous contrast. Multiplanar CT image reconstructions and MIPs were obtained to evaluate the vascular anatomy.  CONTRAST:  100mL OMNIPAQUE IOHEXOL 350 MG/ML SOLN  COMPARISON:  CTA of the chest performed 05/09/2011, and chest radiograph performed 08/24/2013  FINDINGS: There is no evidence of  pulmonary embolus.  Mild bilateral atelectasis is noted. The lungs are otherwise clear. There is no evidence of significant focal consolidation, pleural effusion or pneumothorax. No masses are identified; no abnormal focal contrast enhancement is seen.  The mediastinum is unremarkable in appearance. No mediastinal lymphadenopathy is seen. No pericardial effusion is identified. The great vessels are grossly unremarkable in appearance. No axillary lymphadenopathy is seen. The visualized portions of the thyroid gland are unremarkable in appearance.  The visualized portions of the liver and spleen are unremarkable.  No acute osseous abnormalities are seen.  Review of the MIP images confirms the above findings.  IMPRESSION: 1. No evidence of pulmonary embolus. 2. Mild bilateral atelectasis noted; lungs otherwise clear.   Electronically Signed   By: Jeffery    Chang M.D.   On: 08/25/2013 01:53   Mr Brain Wo Contrast  08/25/2013   CLINICAL DATA:  Right-sided weakness. Numbness and tingling. Headache.  EXAM: MRI HEAD WITHOUT CONTRAST  TECHNIQUE: Multiplanar, multiecho pulse sequences of the brain and surrounding structures were obtained without intravenous contrast.  COMPARISON:  CT head without contrast 08/24/2013.  FINDINGS: No acute infarct, hemorrhage, or mass lesion is present. A relatively empty sella is evident. The pituitary stalk is midline. The optic chiasm is unremarkable. Midline structures are otherwise within normal limits. The ventricles are of normal size. No significant extra-axial fluid collection is present.  Fluid is present in the major intracranial arteries. The globes and orbits are intact. The paranasal sinuses and mastoid air cells are clear.  IMPRESSION: 1. Relatively empty sella without evidence for pituitary pathology. 2. Otherwise normal MRI of the brain.   Electronically Signed   By: Lawrence Santiago M.D.   On: 08/25/2013 16:26   Nm Myocar Multi W/spect W/wall Motion / Ef  08/27/2013    CLINICAL DATA:  Chest pain, diabetes, hypertension. Shortness of breath.  EXAM: MYOCARDIAL IMAGING WITH SPECT (REST AND PHARMACOLOGIC-STRESS - 2 DAY PROTOCOL)  GATED LEFT VENTRICULAR WALL MOTION STUDY  LEFT VENTRICULAR EJECTION FRACTION  TECHNIQUE: Standard myocardial SPECT imaging was performed after resting intravenous injection of 10 mCi Tc-19m sestamibi. Subsequently, on a second day, intravenous infusion of Lexiscan was performed under the supervision of the Cardiology staff. At peak effect of the drug, 30 mCi Tc-56m sestamibi was injected intravenously and standard myocardial SPECT imaging was performed. Quantitative gated imaging was also performed to evaluate left ventricular wall motion, and estimate left ventricular ejection fraction.  COMPARISON:  None  FINDINGS: Myocardial perfusion study: Abnormal drop in activity in the septal wall near the cardiac apex by 15% of activity on stress images compared to rest images, compatible with inducible ischemia.  Ejection fraction calculation: End-diastolic volume is 588 mL. End systolic volume is 51 mL. Derived left ventricular ejection fraction of 54%.  Wall motion analysis: Equivocal hypokinesis in the inferior wall base.  IMPRESSION: 1. Apicoseptal inducible ischemia. 2. Left ventricular ejection fraction 54%. These results will be called to the ordering clinician or representative by the Radiologist Assistant, and communication documented in the PACS or zVision Dashboard.   Electronically Signed   By: Sherryl Barters M.D.   On: 08/27/2013 13:36    ASSESSMENT AND PLAN 1. Chest pain. Apical septal inducible ischemia on Myoview.  2. morbid obesity 3. diabetes mellitus 4. Hypertension  Plan: We'll arrange for left heart cardiac catheterization on Monday, June 1.  The risks and benefits of a cardiac catheterization including, but not limited to, death, stroke, MI, kidney damage and bleeding were discussed with the patient who indicates understanding and  agrees to proceed.     Signed, Darlin Coco MD

## 2013-08-30 ENCOUNTER — Encounter (HOSPITAL_COMMUNITY)
Admission: EM | Disposition: A | Discharge status: Home / Self Care | Payer: Self-pay | Source: Home / Self Care | Attending: Internal Medicine

## 2013-08-30 HISTORY — PX: LEFT HEART CATHETERIZATION WITH CORONARY ANGIOGRAM: SHX5451

## 2013-08-30 LAB — LIPID PANEL
Cholesterol: 260 mg/dL — ABNORMAL HIGH (ref 0–200)
HDL: 41 mg/dL (ref >39)
LDL CALC: 167 mg/dL — AB (ref 0–99)
Total CHOL/HDL Ratio: 6.3 RATIO
Triglycerides: 260 mg/dL — ABNORMAL HIGH (ref <150)
VLDL: 52 mg/dL — ABNORMAL HIGH (ref 0–40)

## 2013-08-30 LAB — GLUCOSE, CAPILLARY
GLUCOSE-CAPILLARY: 114 mg/dL — AB (ref 70–99)
Glucose-Capillary: 157 mg/dL — ABNORMAL HIGH (ref 70–99)
Glucose-Capillary: 157 mg/dL — ABNORMAL HIGH (ref 70–99)

## 2013-08-30 LAB — POCT ACTIVATED CLOTTING TIME: ACTIVATED CLOTTING TIME: 138 s

## 2013-08-30 SURGERY — LEFT HEART CATHETERIZATION WITH CORONARY ANGIOGRAM
Anesthesia: LOCAL

## 2013-08-30 MED ORDER — MIDAZOLAM HCL 2 MG/2ML IJ SOLN
INTRAMUSCULAR | Status: AC
  Filled 2013-08-30: qty 2

## 2013-08-30 MED ORDER — FENTANYL CITRATE 0.05 MG/ML IJ SOLN
INTRAMUSCULAR | Status: AC
  Filled 2013-08-30: qty 2

## 2013-08-30 MED ORDER — VERAPAMIL HCL 2.5 MG/ML IV SOLN
INTRAVENOUS | Status: AC
  Filled 2013-08-30: qty 2

## 2013-08-30 MED ORDER — HEPARIN SODIUM (PORCINE) 1000 UNIT/ML IJ SOLN
INTRAMUSCULAR | Status: AC
  Filled 2013-08-30: qty 1

## 2013-08-30 MED ORDER — HEPARIN (PORCINE) IN NACL 2-0.9 UNIT/ML-% IJ SOLN
INTRAMUSCULAR | Status: AC
  Filled 2013-08-30: qty 1000

## 2013-08-30 MED ORDER — LIDOCAINE HCL (PF) 1 % IJ SOLN
INTRAMUSCULAR | Status: AC
  Filled 2013-08-30: qty 30

## 2013-08-30 MED ORDER — SODIUM CHLORIDE 0.9 % IV SOLN: INTRAVENOUS | Status: DC

## 2013-08-30 NOTE — H&P (View-Only) (Signed)
Patient Name: Denise Davis Date of Encounter: 08/29/2013     Principal Problem:   Chest pain Active Problems:   Diabetes mellitus   Hypertension   Anxiety state, unspecified   Syncope   Right sided weakness   Chronic respiratory failure    SUBJECTIVE  The patient continues to have mild chronic substernal discomfort.  She does not appear to be in any acute distress. Scheduled for cardiac catheterization tomorrow because of apical septal inducible ischemia seen on Myoview. CURRENT MEDS . amitriptyline  200 mg Oral QPM  . antiseptic oral rinse  15 mL Mouth Rinse BID  . [START ON 08/30/2013] aspirin  81 mg Oral Pre-Cath  . aspirin EC  325 mg Oral Daily  . budesonide-formoterol  2 puff Inhalation BID  . carvedilol  12.5 mg Oral BID WC  . Chlorhexidine Gluconate Cloth  6 each Topical Q0600  . enoxaparin (LOVENOX) injection  40 mg Subcutaneous Q24H  . gabapentin  1,200 mg Oral Daily  . gabapentin  1,800 mg Oral QHS  . hydrochlorothiazide  25 mg Oral Daily  . insulin aspart  0-9 Units Subcutaneous TID WC  . lisinopril  20 mg Oral Daily  . loratadine  10 mg Oral QHS  . mupirocin ointment  1 application Nasal BID  . pantoprazole  40 mg Oral Daily  . sertraline  200 mg Oral QHS  . sodium chloride  3 mL Intravenous Q12H  . sodium chloride  3 mL Intravenous Q12H  . sodium chloride  3 mL Intravenous Q12H  . tiotropium  18 mcg Inhalation Daily  . triamcinolone  2 spray Each Nare Daily    OBJECTIVE  Filed Vitals:   08/29/13 0620 08/29/13 0928 08/29/13 0929 08/29/13 1101  BP: 116/69 120/70    Pulse: 90  85   Temp: 98 F (36.7 C)     TempSrc: Oral     Resp: 18     Height:      Weight: 354 lb 11.2 oz (160.891 kg)     SpO2: 100%   100%    Intake/Output Summary (Last 24 hours) at 08/29/13 1136 Last data filed at 08/29/13 0945  Gross per 24 hour  Intake    240 ml  Output      0 ml  Net    240 ml   Filed Weights   08/26/13 0712 08/28/13 0500 08/29/13 0620  Weight:  377 lb 1.6 oz (171.051 kg) 377 lb 3.2 oz (171.097 kg) 354 lb 11.2 oz (160.891 kg)    PHYSICAL EXAM  General: Pleasant, NAD. Significant obesity  Neuro: Alert and oriented X 3. Moves all extremities spontaneously.  Psych: Normal affect.  HEENT: Normal  Neck: Supple without bruits or JVD.  Lungs: Resp regular and unlabored, CTA.  Heart: RRR no s3, s4, or murmurs.  Abdomen: Soft, non-tender, non-distended, BS + x 4.  Extremities: No clubbing, cyanosis or edema. DP/PT/Radials 2+ and equal bilaterally.  Accessory Clinical Findings  CBC No results found for this basename: WBC, NEUTROABS, HGB, HCT, MCV, PLT,  in the last 72 hours Basic Metabolic Panel  Recent Labs  08/29/13 0535  NA 135*  K 4.6  CL 95*  CO2 29  GLUCOSE 168*  BUN 24*  CREATININE 1.08  CALCIUM 9.2   Liver Function Tests No results found for this basename: AST, ALT, ALKPHOS, BILITOT, PROT, ALBUMIN,  in the last 72 hours No results found for this basename: LIPASE, AMYLASE,  in the last 72 hours Cardiac  Enzymes No results found for this basename: CKTOTAL, CKMB, CKMBINDEX, TROPONINI,  in the last 72 hours BNP No components found with this basename: POCBNP,  D-Dimer No results found for this basename: DDIMER,  in the last 72 hours Hemoglobin A1C No results found for this basename: HGBA1C,  in the last 72 hours Fasting Lipid Panel No results found for this basename: CHOL, HDL, LDLCALC, TRIG, CHOLHDL, LDLDIRECT,  in the last 72 hours Thyroid Function Tests No results found for this basename: TSH, T4TOTAL, FREET3, T3FREE, THYROIDAB,  in the last 72 hours  TELE  Normal sinus rhythm  ECG    Radiology/Studies  Dg Chest 2 View  08/24/2013   CLINICAL DATA:  Mid to RIGHT chest pain, shortness of breath, history hypertension, diabetes, renal cell carcinoma  EXAM: CHEST  2 VIEW  COMPARISON:  08/16/2012  FINDINGS: Enlargement of cardiac silhouette.  Slight pulmonary vascular congestion.  Mediastinal contours normal.   No acute infiltrate, pleural effusion, or pneumothorax.  Bones unremarkable.  IMPRESSION: Enlargement of cardiac silhouette with slight pulmonary vascular congestion.  No acute abnormalities.   Electronically Signed   By: Lavonia Dana M.D.   On: 08/24/2013 23:11   Ct Head Wo Contrast  08/25/2013   CLINICAL DATA:  Loss of consciousness.  Right-sided weakness.  EXAM: CT HEAD WITHOUT CONTRAST  TECHNIQUE: Contiguous axial images were obtained from the base of the skull through the vertex without intravenous contrast.  COMPARISON:  None.  FINDINGS: There is no evidence of acute infarction, mass lesion, or intra- or extra-axial hemorrhage on CT.  The posterior fossa, including the cerebellum, brainstem and fourth ventricle, is within normal limits. The third and lateral ventricles, and basal ganglia are unremarkable in appearance. The cerebral hemispheres are symmetric in appearance, with normal gray-white differentiation. No mass effect or midline shift is seen.  There is no evidence of fracture; visualized osseous structures are unremarkable in appearance. The orbits are within normal limits. The paranasal sinuses and mastoid air cells are well-aerated. No significant soft tissue abnormalities are seen.  IMPRESSION: Unremarkable noncontrast CT of the head.   Electronically Signed   By: Garald Balding M.D.   On: 08/25/2013 00:40   Ct Angio Chest Pe W/cm &/or Wo Cm  08/25/2013   CLINICAL DATA:  Shortness of breath; syncope. Concern for pulmonary embolus.  EXAM: CT ANGIOGRAPHY CHEST WITH CONTRAST  TECHNIQUE: Multidetector CT imaging of the chest was performed using the standard protocol during bolus administration of intravenous contrast. Multiplanar CT image reconstructions and MIPs were obtained to evaluate the vascular anatomy.  CONTRAST:  128mL OMNIPAQUE IOHEXOL 350 MG/ML SOLN  COMPARISON:  CTA of the chest performed 05/09/2011, and chest radiograph performed 08/24/2013  FINDINGS: There is no evidence of  pulmonary embolus.  Mild bilateral atelectasis is noted. The lungs are otherwise clear. There is no evidence of significant focal consolidation, pleural effusion or pneumothorax. No masses are identified; no abnormal focal contrast enhancement is seen.  The mediastinum is unremarkable in appearance. No mediastinal lymphadenopathy is seen. No pericardial effusion is identified. The great vessels are grossly unremarkable in appearance. No axillary lymphadenopathy is seen. The visualized portions of the thyroid gland are unremarkable in appearance.  The visualized portions of the liver and spleen are unremarkable.  No acute osseous abnormalities are seen.  Review of the MIP images confirms the above findings.  IMPRESSION: 1. No evidence of pulmonary embolus. 2. Mild bilateral atelectasis noted; lungs otherwise clear.   Electronically Signed   By: Jacqulynn Cadet  Chang M.D.   On: 08/25/2013 01:53   Mr Brain Wo Contrast  08/25/2013   CLINICAL DATA:  Right-sided weakness. Numbness and tingling. Headache.  EXAM: MRI HEAD WITHOUT CONTRAST  TECHNIQUE: Multiplanar, multiecho pulse sequences of the brain and surrounding structures were obtained without intravenous contrast.  COMPARISON:  CT head without contrast 08/24/2013.  FINDINGS: No acute infarct, hemorrhage, or mass lesion is present. A relatively empty sella is evident. The pituitary stalk is midline. The optic chiasm is unremarkable. Midline structures are otherwise within normal limits. The ventricles are of normal size. No significant extra-axial fluid collection is present.  Fluid is present in the major intracranial arteries. The globes and orbits are intact. The paranasal sinuses and mastoid air cells are clear.  IMPRESSION: 1. Relatively empty sella without evidence for pituitary pathology. 2. Otherwise normal MRI of the brain.   Electronically Signed   By: Lawrence Santiago M.D.   On: 08/25/2013 16:26   Nm Myocar Multi W/spect W/wall Motion / Ef  08/27/2013    CLINICAL DATA:  Chest pain, diabetes, hypertension. Shortness of breath.  EXAM: MYOCARDIAL IMAGING WITH SPECT (REST AND PHARMACOLOGIC-STRESS - 2 DAY PROTOCOL)  GATED LEFT VENTRICULAR WALL MOTION STUDY  LEFT VENTRICULAR EJECTION FRACTION  TECHNIQUE: Standard myocardial SPECT imaging was performed after resting intravenous injection of 10 mCi Tc-19m sestamibi. Subsequently, on a second day, intravenous infusion of Lexiscan was performed under the supervision of the Cardiology staff. At peak effect of the drug, 30 mCi Tc-56m sestamibi was injected intravenously and standard myocardial SPECT imaging was performed. Quantitative gated imaging was also performed to evaluate left ventricular wall motion, and estimate left ventricular ejection fraction.  COMPARISON:  None  FINDINGS: Myocardial perfusion study: Abnormal drop in activity in the septal wall near the cardiac apex by 15% of activity on stress images compared to rest images, compatible with inducible ischemia.  Ejection fraction calculation: End-diastolic volume is 588 mL. End systolic volume is 51 mL. Derived left ventricular ejection fraction of 54%.  Wall motion analysis: Equivocal hypokinesis in the inferior wall base.  IMPRESSION: 1. Apicoseptal inducible ischemia. 2. Left ventricular ejection fraction 54%. These results will be called to the ordering clinician or representative by the Radiologist Assistant, and communication documented in the PACS or zVision Dashboard.   Electronically Signed   By: Sherryl Barters M.D.   On: 08/27/2013 13:36    ASSESSMENT AND PLAN 1. Chest pain. Apical septal inducible ischemia on Myoview.  2. morbid obesity 3. diabetes mellitus 4. Hypertension  Plan: We'll arrange for left heart cardiac catheterization on Monday, June 1.  The risks and benefits of a cardiac catheterization including, but not limited to, death, stroke, MI, kidney damage and bleeding were discussed with the patient who indicates understanding and  agrees to proceed.     Signed, Darlin Coco MD

## 2013-08-30 NOTE — Progress Notes (Signed)
Patient Name: Denise Davis Date of Encounter: 08/30/2013     Principal Problem:   Chest pain Active Problems:   Diabetes mellitus   Hypertension   Anxiety state, unspecified   Syncope   Right sided weakness   Chronic respiratory failure    SUBJECTIVE  Continue to have chest discomfort overnight. Some R hand tingling, however no L hand tingling. No acute event per nursing staff  CURRENT MEDS . amitriptyline  200 mg Oral QPM  . antiseptic oral rinse  15 mL Mouth Rinse BID  . aspirin EC  325 mg Oral Daily  . budesonide-formoterol  2 puff Inhalation BID  . carvedilol  12.5 mg Oral BID WC  . enoxaparin (LOVENOX) injection  80 mg Subcutaneous Q24H  . gabapentin  1,200 mg Oral Daily  . gabapentin  1,800 mg Oral QHS  . hydrochlorothiazide  25 mg Oral Daily  . insulin aspart  0-9 Units Subcutaneous TID WC  . lisinopril  20 mg Oral Daily  . loratadine  10 mg Oral QHS  . mupirocin ointment  1 application Nasal BID  . pantoprazole  40 mg Oral Daily  . sertraline  200 mg Oral QHS  . sodium chloride  3 mL Intravenous Q12H  . sodium chloride  3 mL Intravenous Q12H  . sodium chloride  3 mL Intravenous Q12H  . tiotropium  18 mcg Inhalation Daily  . triamcinolone  2 spray Each Nare Daily    OBJECTIVE  Filed Vitals:   08/29/13 1445 08/29/13 1500 08/29/13 2039 08/30/13 0651  BP: 124/75  100/64 116/62  Pulse: 96  97 89  Temp: 98.4 F (36.9 C)  97.7 F (36.5 C) 98 F (36.7 C)  TempSrc: Oral  Oral Oral  Resp: 20  18 18   Height:  5\' 5"  (1.651 m)    Weight:  354 lb 11.2 oz (160.891 kg)  354 lb 15.1 oz (161 kg)  SpO2: 98%  100% 100%    Intake/Output Summary (Last 24 hours) at 08/30/13 0831 Last data filed at 08/30/13 0749  Gross per 24 hour  Intake    840 ml  Output      0 ml  Net    840 ml   Filed Weights   08/29/13 0620 08/29/13 1500 08/30/13 0651  Weight: 354 lb 11.2 oz (160.891 kg) 354 lb 11.2 oz (160.891 kg) 354 lb 15.1 oz (161 kg)    PHYSICAL EXAM  General:  Pleasant, NAD. Morbidly obese Neuro: Alert and oriented X 3. Moves all extremities spontaneously. Psych: Normal affect. HEENT:  Normal  Neck: Supple without bruits or JVD. Lungs:  Resp regular and unlabored, difficult to assess given large body habitus Heart: RRR no s3, s4, or murmurs. Abdomen: Soft, non-tender, non-distended, BS + x 4. Pendulus Extremities: No clubbing, cyanosis or edema. DP/PT/Radials 2+ and equal bilaterally. Largely symmetrical radial pulse, did not appreciate significant subclavian bruits  Accessory Clinical Findings  CBC No results found for this basename: WBC, NEUTROABS, HGB, HCT, MCV, PLT,  in the last 72 hours Basic Metabolic Panel  Recent Labs  08/29/13 0535  NA 135*  K 4.6  CL 95*  CO2 29  GLUCOSE 168*  BUN 24*  CREATININE 1.08  CALCIUM 9.2   Liver Function Tests No results found for this basename: AST, ALT, ALKPHOS, BILITOT, PROT, ALBUMIN,  in the last 72 hours No results found for this basename: LIPASE, AMYLASE,  in the last 72 hours Cardiac Enzymes No results found for this basename:  CKTOTAL, CKMB, CKMBINDEX, TROPONINI,  in the last 72 hours BNP No components found with this basename: POCBNP,  D-Dimer No results found for this basename: DDIMER,  in the last 72 hours Hemoglobin A1C  Recent Labs  08/29/13 0535  HGBA1C 7.6*   Fasting Lipid Panel No results found for this basename: CHOL, HDL, LDLCALC, TRIG, CHOLHDL, LDLDIRECT,  in the last 72 hours Thyroid Function Tests No results found for this basename: TSH, T4TOTAL, FREET3, T3FREE, THYROIDAB,  in the last 72 hours  TELE  Sinus tach with base heartrate 90s-100s.   ECG  Sinus tach with HR 90s, mildly prolonged QT, nonspecific ST-T wave changes (t wave inversion V2)  Radiology/Studies  Dg Chest 2 View  08/24/2013   CLINICAL DATA:  Mid to RIGHT chest pain, shortness of breath, history hypertension, diabetes, renal cell carcinoma  EXAM: CHEST  2 VIEW  COMPARISON:  08/16/2012   FINDINGS: Enlargement of cardiac silhouette.  Slight pulmonary vascular congestion.  Mediastinal contours normal.  No acute infiltrate, pleural effusion, or pneumothorax.  Bones unremarkable.  IMPRESSION: Enlargement of cardiac silhouette with slight pulmonary vascular congestion.  No acute abnormalities.   Electronically Signed   By: Lavonia Dana M.D.   On: 08/24/2013 23:11   Ct Head Wo Contrast  08/25/2013   CLINICAL DATA:  Loss of consciousness.  Right-sided weakness.  EXAM: CT HEAD WITHOUT CONTRAST  TECHNIQUE: Contiguous axial images were obtained from the base of the skull through the vertex without intravenous contrast.  COMPARISON:  None.  FINDINGS: There is no evidence of acute infarction, mass lesion, or intra- or extra-axial hemorrhage on CT.  The posterior fossa, including the cerebellum, brainstem and fourth ventricle, is within normal limits. The third and lateral ventricles, and basal ganglia are unremarkable in appearance. The cerebral hemispheres are symmetric in appearance, with normal gray-white differentiation. No mass effect or midline shift is seen.  There is no evidence of fracture; visualized osseous structures are unremarkable in appearance. The orbits are within normal limits. The paranasal sinuses and mastoid air cells are well-aerated. No significant soft tissue abnormalities are seen.  IMPRESSION: Unremarkable noncontrast CT of the head.   Electronically Signed   By: Garald Balding M.D.   On: 08/25/2013 00:40   Ct Angio Chest Pe W/cm &/or Wo Cm  08/25/2013   CLINICAL DATA:  Shortness of breath; syncope. Concern for pulmonary embolus.  EXAM: CT ANGIOGRAPHY CHEST WITH CONTRAST  TECHNIQUE: Multidetector CT imaging of the chest was performed using the standard protocol during bolus administration of intravenous contrast. Multiplanar CT image reconstructions and MIPs were obtained to evaluate the vascular anatomy.  CONTRAST:  174mL OMNIPAQUE IOHEXOL 350 MG/ML SOLN  COMPARISON:  CTA of the  chest performed 05/09/2011, and chest radiograph performed 08/24/2013  FINDINGS: There is no evidence of pulmonary embolus.  Mild bilateral atelectasis is noted. The lungs are otherwise clear. There is no evidence of significant focal consolidation, pleural effusion or pneumothorax. No masses are identified; no abnormal focal contrast enhancement is seen.  The mediastinum is unremarkable in appearance. No mediastinal lymphadenopathy is seen. No pericardial effusion is identified. The great vessels are grossly unremarkable in appearance. No axillary lymphadenopathy is seen. The visualized portions of the thyroid gland are unremarkable in appearance.  The visualized portions of the liver and spleen are unremarkable.  No acute osseous abnormalities are seen.  Review of the MIP images confirms the above findings.  IMPRESSION: 1. No evidence of pulmonary embolus. 2. Mild bilateral atelectasis noted; lungs otherwise  clear.   Electronically Signed   By: Garald Balding M.D.   On: 08/25/2013 01:53   Mr Brain Wo Contrast  08/25/2013   CLINICAL DATA:  Right-sided weakness. Numbness and tingling. Headache.  EXAM: MRI HEAD WITHOUT CONTRAST  TECHNIQUE: Multiplanar, multiecho pulse sequences of the brain and surrounding structures were obtained without intravenous contrast.  COMPARISON:  CT head without contrast 08/24/2013.  FINDINGS: No acute infarct, hemorrhage, or mass lesion is present. A relatively empty sella is evident. The pituitary stalk is midline. The optic chiasm is unremarkable. Midline structures are otherwise within normal limits. The ventricles are of normal size. No significant extra-axial fluid collection is present.  Fluid is present in the major intracranial arteries. The globes and orbits are intact. The paranasal sinuses and mastoid air cells are clear.  IMPRESSION: 1. Relatively empty sella without evidence for pituitary pathology. 2. Otherwise normal MRI of the brain.   Electronically Signed   By: Lawrence Santiago M.D.   On: 08/25/2013 16:26   Nm Myocar Multi W/spect W/wall Motion / Ef  08/27/2013   CLINICAL DATA:  Chest pain, diabetes, hypertension. Shortness of breath.  EXAM: MYOCARDIAL IMAGING WITH SPECT (REST AND PHARMACOLOGIC-STRESS - 2 DAY PROTOCOL)  GATED LEFT VENTRICULAR WALL MOTION STUDY  LEFT VENTRICULAR EJECTION FRACTION  TECHNIQUE: Standard myocardial SPECT imaging was performed after resting intravenous injection of 10 mCi Tc-16m sestamibi. Subsequently, on a second day, intravenous infusion of Lexiscan was performed under the supervision of the Cardiology staff. At peak effect of the drug, 30 mCi Tc-60m sestamibi was injected intravenously and standard myocardial SPECT imaging was performed. Quantitative gated imaging was also performed to evaluate left ventricular wall motion, and estimate left ventricular ejection fraction.  COMPARISON:  None  FINDINGS: Myocardial perfusion study: Abnormal drop in activity in the septal wall near the cardiac apex by 15% of activity on stress images compared to rest images, compatible with inducible ischemia.  Ejection fraction calculation: End-diastolic volume is 409 mL. End systolic volume is 51 mL. Derived left ventricular ejection fraction of 54%.  Wall motion analysis: Equivocal hypokinesis in the inferior wall base.  IMPRESSION: 1. Apicoseptal inducible ischemia. 2. Left ventricular ejection fraction 54%. These results will be called to the ordering clinician or representative by the Radiologist Assistant, and communication documented in the PACS or zVision Dashboard.   Electronically Signed   By: Sherryl Barters M.D.   On: 08/27/2013 13:36    ASSESSMENT AND PLAN  1. Chest pain with possible syncope. Apical septal inducible ischemia on Myoview.   - given atypical symptom with tachycardia, syncope and ipsilateral tingling sx, discussed with Dr. Marion Downer of Radiology in am of 6/1 to over-read 5/27 film who confirm there is no dissection seen on previous CTA of  chest  - Echo 5/28 EF 81-19%, grade 1 diastolic dysfunction  - plan for LHC today given abnormal Myoview result  - Denies any bleeding, h/o stroke or upcoming surgeries. (did use warfarin when she had DVT along with cancer, however has not been using any coumadin in awhile) 2. morbid obesity  3. diabetes mellitus. A1C 7.6 (08/29/2013) 4. Hypertension  Check lipid panel  Signed, Almyra Deforest PA Pager: [phone]

## 2013-08-30 NOTE — CV Procedure (Signed)
Denise Davis is a 48 y.o. female   [id number]  [id number] LOCATION:  FACILITY: [facility]  PHYSICIAN: Troy Sine, MD, [facility] 12-03-1965   DATE OF PROCEDURE:  08/30/2013     CARDIAC CATHETERIZATION    HISTORY: Ms. Lariah Fleer is a 48 year old female who has a history of morbid obesity, diabetes mellitus, hypertension, who presented with syncope and has also experienced mild chronic substernal chest discomfort.  A nuclear perfusion study raised the possibility of apical septal inducible ischemia.  She is now referred for definitive cardiac catheterization.   PROCEDURE: Left heart catheterization: Coronary angiography; left ventriculography  The patient was brought to the second floor [facility] cath lab in the postabsorptive state. The patient required a significant amount of sedation and was initially premedicated with Versed 2 mg and fentanyl 50 mcg.  She required several additional doses and received a total of 6 mg Versed and 125 mcg fetany.l A right radial approach was initially utilized after an Allen's test verified adequate circulation. The right radial artery was punctured via the Seldinger technique without difficulty, and a 6 [place] Slender was inserted without difficulty.  A radial cocktail consisting of Verapamil, IV nitroglycerin, and lidocaine was administered.  Heparin 5000 units was administered. A versicore was advanced into the ascending aorta. Attempted diagnostic catheterization was done with multiple catheters including a 5 French TIG 4.0, JR 4, JL 3.5 and ERAD left catheters. Due to marked tortousity in the subclavian and inominate region the vessels were not able to be selectively cannulated due to torque difficulty. The radial approach was then aborted to try to minimize further contrast load in this patient with only one kidney.  A TR radial band was applied for hemostasis.The right groin was prepped and draped in sterile fashion.  Catheterization was  performed without difficulty via the right femoral artery utilizing 5 French L4 and 5 [place] 4 diagnostic catheters.  A 5 French pigtail catheter was used for left ventriculography.  At the completion of the procedure the ACT was 134.  Hemostasis was obtained by manual pressure.  The patient tolerated the procedure well and left the catheterization laboratory in stable condition.   HEMODYNAMICS:   Central Aorta: 114/74  Left Ventricle: 114/17  ANGIOGRAPHY:   The left main coronary artery was angiographically normal and trifurcated into the LAD, a ramus intermediate and left circumflex coronary artery.   The LAD was angiographically normal and gave rise to 2 major diagonal vessels and several septal perforating arteries. The vessel extended to the LV apex.  The mid-distal portion had a small segment, which dipped intramyocardially, but there was no evidence for systolic muscle bridging.  The ramus intermediate vessel was a large caliber normal vessel which bifurcated distally  The left circumflex coronary artery was a normal vessel and gave rise to two major obtuse marginal branches.   The RCA gave rise to a large PDA and PLA vessel.  There is mild percent luminal narrowing in the mid RCA, and 20-30% proximal focal narrowing in the PDA branch.  Left ventriculography revealed normal global LV contractility without focal segmental wall motion abnormalities. There was no evidence for mitral regurgitation.  Ejection fraction estimated 60%.   IMPRESSION:  Normal left ventricular function.  No significant coronary obstructive disease with mild percent luminal narrowing in the mid RCA in 20-30% narrowing in the proximal PDA branch, and angiographically normal LAD, ramus intermediate, and left circumflex coronary arteries.   RECOMMENDATION:  Medical therapy.   Marcello Moores  A. Claiborne Billings, MD, [facility] 08/30/2013 12:36 PM

## 2013-08-30 NOTE — Progress Notes (Signed)
Pt is ready for DC home accompanied by family. Pt states she understands all DC orders including medications and follow up appointments.   Prescilla Sours, Therapist, sports

## 2013-08-30 NOTE — Progress Notes (Signed)
Cardiac cath on 08/30/2013 shows no significant CAD with 20-30% RCA narrowing. Medical therapy recommended. Result has been discussed with Dr. Carles Collet of Internal Medicine. Patient is stable to discharge from cardiology perspective.   Patient was seen post cath, R radial cath site appears to be intact, without significant bleeding or hematoma. Wound check follow up has been scheduled on 09/13/2013 at 1:15 pm.

## 2013-08-30 NOTE — Discharge Summary (Signed)
Physician Discharge Summary  Denise Davis [id number]:[id number] DOB: 1965/11/03 DOA: 08/24/2013  PCP: Marguerita Merles, MD  Admit date: 08/24/2013 Discharge date: 08/30/2013  Recommendations for Outpatient Follow-up:  1. Pt will need to follow up with PCP in 2 weeks post discharge 2. Please obtain BMP to evaluate electrolytes and kidney function 3. Please also check CBC to evaluate Hg and Hct levels   Discharge Diagnoses:  Chest pain with syncopal episode:  -Patient was passenger in the car during the episode  - Cardiac enzymes negative so far, EKG showed no acute ST-T wave changes suggestive of ischemia  - CT angiogram negative for any PE.  - Cardiology was consulted, 2-D echo showed EF of 44-81%, grade 1 diastolic dysfunction, no WMA - Patient underwent 2-day stress test, which showed apical septal inducible ischemia, LVF 54%  -cath 08/30/13--no significant coronary artery disease -No further neurologic workup per the neurology service -Patient is medically stable for discharge- Right hemiparesis with sensory disturbance  -Resolved.  -CT head negative for any acute CVA, patient's husband did not notice any seizure activity.  -Neurology was consulted and MRI of the brain was done which showed no acute CVA.  -Patient was placed on aspirin.  -MRI of the brain showed no acute CVA.  -Neurology was consulted and felt exam showing multiple inconsistencies and significant giveaway weakness did not recommend any further neurodiagnostic studies.  Chronic respiratory failure  -Presently stable on home 2 L -will need outpt polysomnogram -instructed the patient to minimize opioids  Chronic right knee pain  -chronically wheelchair-bound  -Patient reports that she has been chronically having right knee pain, her primary care physician was recommending her to physical therapy.  -Patient was seen by PT and OT during hospitalization, recommended intermittent supervision home PT .  -recommended her to have  her PCP refer patient to orthopedics for further management  Diabetes mellitus  - Continue sliding scale insulin  - Hold oral hypoglycemic medications during hospitalization  -hemoglobin A1c--7.6 -Patient will resume metformin and glipizide at the time of discharge  Hypertension  - Currently stable  Family Communication: husband at beside  Disposition Plan: Home when medically stable   Discharge Condition: Stable  Disposition: Home  Follow-up Information   Follow up with Marguerita Merles, MD. Schedule an appointment as soon as possible for a visit in 10 days. (for hospital follow-up)    Specialty:  Family Medicine   Contact information:   [facility] [place] [address] [phone]       Follow up with Ermalinda Barrios, PA-C On 09/13/2013. (09/13/2013 1:15 pm, For wound re-check (post cath R radial site). No further cardiology follow up necessary)    Specialty:  Cardiology   Contact information:   [address] [place]  [address] [phone]       Diet:heart healthy Wt Readings from Last 3 Encounters:  08/30/13 161 kg (354 lb 15.1 oz)  08/30/13 161 kg (354 lb 15.1 oz)  08/19/12 168.2 kg (370 lb 13 oz)    History of present illness:  48 y.o. female with history of diabetes mellitus, hypertension, renal cell cancer status post nephrectomy, COPD on home oxygen was brought to the ER patient had loss of consciousness. Per patient's husband patient was on a truck when patient became unresponsive. They do not know how long she was unresponsive because when they reached the destination and tried to wake up the patient she was not waking up. They called the EMS  and once EMS kept on oxygen patient woke up. Patient does not recall the incident. In the ER patient was complaining of some chest pain and headache. CT head and angiogram of the chest was negative for anything acute. EKG shows sinus rhythm with PVCs and cardiac markers are  negative. Patient on exam is nonfocal and has been admitted for further management. Patient is usually wheelchair-bound. Patient denies any nausea vomiting abdominal pain diarrhea. Husband did not notice any seizure-like activities     Consultants: Cardiology Neurology  Discharge Exam: Filed Vitals:   08/30/13 1057  BP:   Pulse: 57  Temp:   Resp:    Filed Vitals:   08/30/13 0651 08/30/13 0854 08/30/13 0948 08/30/13 1057  BP: 116/62 145/81    Pulse: 89   57  Temp: 98 F (36.7 C)     TempSrc: Oral     Resp: 18     Height:      Weight: 161 kg (354 lb 15.1 oz)     SpO2: 100%  99%    General: A&O x 3, NAD, pleasant, cooperative Cardiovascular: RRR, no rub, no gallop, no S3 Respiratory: CTAB, no wheeze, no rhonchi Abdomen:soft, nontender, nondistended, positive bowel sounds Extremities: 2+LE edema, No lymphangitis, no petechiae  Discharge Instructions      Discharge Instructions   Diet - low sodium heart healthy    Complete by:  As directed      Increase activity slowly    Complete by:  As directed             Medication List    STOP taking these medications       ibuprofen 800 MG tablet  Commonly known as:  ADVIL,MOTRIN      TAKE these medications       amitriptyline 50 MG tablet  Commonly known as:  ELAVIL  Take 200 mg by mouth every evening. Take 2-3 hours prior to bedtime     aspirin EC 81 MG tablet  Take 81 mg by mouth daily.     budesonide-formoterol 160-4.5 MCG/ACT inhaler  Commonly known as:  SYMBICORT  Inhale 2 puffs into the lungs 2 (two) times daily.     carvedilol 12.5 MG tablet  Commonly known as:  COREG  Take 12.5 mg by mouth 2 (two) times daily with a meal.     diazepam 10 MG tablet  Commonly known as:  VALIUM  Take 10 mg by mouth 3 (three) times daily as needed for anxiety.     gabapentin 300 MG capsule  Commonly known as:  NEURONTIN  Take 1,800 mg by mouth at bedtime.     gabapentin 600 MG tablet  Commonly known as:   NEURONTIN  Take 1,200 mg by mouth daily.     glipiZIDE 5 MG tablet  Commonly known as:  GLUCOTROL  Take 5 mg by mouth 2 (two) times daily before a meal.     hydrochlorothiazide 25 MG tablet  Commonly known as:  HYDRODIURIL  Take 25 mg by mouth daily.     HYDROcodone-acetaminophen 5-325 MG per tablet  Commonly known as:  NORCO/VICODIN  Take 1 tablet by mouth every 6 (six) hours as needed for moderate pain or severe pain.     lisinopril 20 MG tablet  Commonly known as:  PRINIVIL,ZESTRIL  Take 20 mg by mouth daily.     loratadine 10 MG tablet  Commonly known as:  CLARITIN  Take 10 mg by mouth at bedtime.  metFORMIN 1000 MG tablet  Commonly known as:  GLUCOPHAGE  Take 1,000 mg by mouth 2 (two) times daily with a meal.     NASACORT AQ 55 MCG/ACT Aero nasal inhaler  Generic drug:  triamcinolone  Place 2 sprays into both nostrils daily.     NEXIUM 40 MG capsule  Generic drug:  esomeprazole  Take 40 mg by mouth daily.     nitroGLYCERIN 0.4 MG SL tablet  Commonly known as:  NITROSTAT  Place 0.4 mg under the tongue every 5 (five) minutes as needed for chest pain. (may repeat every 5 minutes but seek medical help if pain persists after 3 tablets)     promethazine 25 MG tablet  Commonly known as:  PHENERGAN  Take 25 mg by mouth 4 (four) times daily as needed for nausea or vomiting.     sertraline 100 MG tablet  Commonly known as:  ZOLOFT  Take 200 mg by mouth at bedtime.     tiotropium 18 MCG inhalation capsule  Commonly known as:  SPIRIVA  Place 18 mcg into inhaler and inhale daily.     traMADol 50 MG tablet  Commonly known as:  ULTRAM  Take 1 tablet (50 mg total) by mouth every 6 (six) hours as needed for moderate pain.         The results of significant diagnostics from this hospitalization (including imaging, microbiology, ancillary and laboratory) are listed below for reference.    Significant Diagnostic Studies: Dg Chest 2 View  08/24/2013   CLINICAL DATA:   Mid to RIGHT chest pain, shortness of breath, history hypertension, diabetes, renal cell carcinoma  EXAM: CHEST  2 VIEW  COMPARISON:  08/16/2012  FINDINGS: Enlargement of cardiac silhouette.  Slight pulmonary vascular congestion.  Mediastinal contours normal.  No acute infiltrate, pleural effusion, or pneumothorax.  Bones unremarkable.  IMPRESSION: Enlargement of cardiac silhouette with slight pulmonary vascular congestion.  No acute abnormalities.   Electronically Signed   By: Lavonia Dana M.D.   On: 08/24/2013 23:11   Ct Head Wo Contrast  08/25/2013   CLINICAL DATA:  Loss of consciousness.  Right-sided weakness.  EXAM: CT HEAD WITHOUT CONTRAST  TECHNIQUE: Contiguous axial images were obtained from the base of the skull through the vertex without intravenous contrast.  COMPARISON:  None.  FINDINGS: There is no evidence of acute infarction, mass lesion, or intra- or extra-axial hemorrhage on CT.  The posterior fossa, including the cerebellum, brainstem and fourth ventricle, is within normal limits. The third and lateral ventricles, and basal ganglia are unremarkable in appearance. The cerebral hemispheres are symmetric in appearance, with normal gray-white differentiation. No mass effect or midline shift is seen.  There is no evidence of fracture; visualized osseous structures are unremarkable in appearance. The orbits are within normal limits. The paranasal sinuses and mastoid air cells are well-aerated. No significant soft tissue abnormalities are seen.  IMPRESSION: Unremarkable noncontrast CT of the head.   Electronically Signed   By: Garald Balding M.D.   On: 08/25/2013 00:40   Ct Angio Chest Pe W/cm &/or Wo Cm  08/25/2013   CLINICAL DATA:  Shortness of breath; syncope. Concern for pulmonary embolus.  EXAM: CT ANGIOGRAPHY CHEST WITH CONTRAST  TECHNIQUE: Multidetector CT imaging of the chest was performed using the standard protocol during bolus administration of intravenous contrast. Multiplanar CT image  reconstructions and MIPs were obtained to evaluate the vascular anatomy.  CONTRAST:  111mL OMNIPAQUE IOHEXOL 350 MG/ML SOLN  COMPARISON:  CTA of the  chest performed 05/09/2011, and chest radiograph performed 08/24/2013  FINDINGS: There is no evidence of pulmonary embolus.  Mild bilateral atelectasis is noted. The lungs are otherwise clear. There is no evidence of significant focal consolidation, pleural effusion or pneumothorax. No masses are identified; no abnormal focal contrast enhancement is seen.  The mediastinum is unremarkable in appearance. No mediastinal lymphadenopathy is seen. No pericardial effusion is identified. The great vessels are grossly unremarkable in appearance. No axillary lymphadenopathy is seen. The visualized portions of the thyroid gland are unremarkable in appearance.  The visualized portions of the liver and spleen are unremarkable.  No acute osseous abnormalities are seen.  Review of the MIP images confirms the above findings.  IMPRESSION: 1. No evidence of pulmonary embolus. 2. Mild bilateral atelectasis noted; lungs otherwise clear.   Electronically Signed   By: Garald Balding M.D.   On: 08/25/2013 01:53   Mr Brain Wo Contrast  08/25/2013   CLINICAL DATA:  Right-sided weakness. Numbness and tingling. Headache.  EXAM: MRI HEAD WITHOUT CONTRAST  TECHNIQUE: Multiplanar, multiecho pulse sequences of the brain and surrounding structures were obtained without intravenous contrast.  COMPARISON:  CT head without contrast 08/24/2013.  FINDINGS: No acute infarct, hemorrhage, or mass lesion is present. A relatively empty sella is evident. The pituitary stalk is midline. The optic chiasm is unremarkable. Midline structures are otherwise within normal limits. The ventricles are of normal size. No significant extra-axial fluid collection is present.  Fluid is present in the major intracranial arteries. The globes and orbits are intact. The paranasal sinuses and mastoid air cells are clear.   IMPRESSION: 1. Relatively empty sella without evidence for pituitary pathology. 2. Otherwise normal MRI of the brain.   Electronically Signed   By: Lawrence Santiago M.D.   On: 08/25/2013 16:26   Nm Myocar Multi W/spect W/wall Motion / Ef  08/27/2013   CLINICAL DATA:  Chest pain, diabetes, hypertension. Shortness of breath.  EXAM: MYOCARDIAL IMAGING WITH SPECT (REST AND PHARMACOLOGIC-STRESS - 2 DAY PROTOCOL)  GATED LEFT VENTRICULAR WALL MOTION STUDY  LEFT VENTRICULAR EJECTION FRACTION  TECHNIQUE: Standard myocardial SPECT imaging was performed after resting intravenous injection of 10 mCi Tc-58m sestamibi. Subsequently, on a second day, intravenous infusion of Lexiscan was performed under the supervision of the Cardiology staff. At peak effect of the drug, 30 mCi Tc-50m sestamibi was injected intravenously and standard myocardial SPECT imaging was performed. Quantitative gated imaging was also performed to evaluate left ventricular wall motion, and estimate left ventricular ejection fraction.  COMPARISON:  None  FINDINGS: Myocardial perfusion study: Abnormal drop in activity in the septal wall near the cardiac apex by 15% of activity on stress images compared to rest images, compatible with inducible ischemia.  Ejection fraction calculation: End-diastolic volume is A999333 mL. End systolic volume is 51 mL. Derived left ventricular ejection fraction of 54%.  Wall motion analysis: Equivocal hypokinesis in the inferior wall base.  IMPRESSION: 1. Apicoseptal inducible ischemia. 2. Left ventricular ejection fraction 54%. These results will be called to the ordering clinician or representative by the Radiologist Assistant, and communication documented in the PACS or zVision Dashboard.   Electronically Signed   By: Sherryl Barters M.D.   On: 08/27/2013 13:36     Microbiology: Recent Results (from the past 240 hour(s))  MRSA PCR SCREENING     Status: Abnormal   Collection Time    08/25/13  4:42 AM      Result Value  Ref Range Status   MRSA by PCR  POSITIVE (*) NEGATIVE Final   Comment:            The GeneXpert MRSA Assay (FDA     approved for NASAL specimens     only), is one component of a     comprehensive MRSA colonization     surveillance program. It is not     intended to diagnose MRSA     infection nor to guide or     monitor treatment for     MRSA infections.     RESULT CALLED TO, READ BACK BY AND VERIFIED WITH:     T.DORMON RN [id number] 08/25/13 E.GADDY     Labs: Basic Metabolic Panel:  Recent Labs Lab 08/24/13 2229 08/29/13 0535  NA 136* 135*  K 4.4 4.6  CL 98 95*  CO2 24 29  GLUCOSE 176* 168*  BUN 14 24*  CREATININE 0.88 1.08  CALCIUM 9.0 9.2   Liver Function Tests:  Recent Labs Lab 08/24/13 2229  AST 15  ALT 15  ALKPHOS 88  BILITOT <0.2*  PROT 7.6  ALBUMIN 3.3*   No results found for this basename: LIPASE, AMYLASE,  in the last 168 hours No results found for this basename: AMMONIA,  in the last 168 hours CBC:  Recent Labs Lab 08/24/13 2229  WBC 5.6  NEUTROABS 2.8  HGB 10.5*  HCT 32.4*  MCV 88.8  PLT 277   Cardiac Enzymes:  Recent Labs Lab 08/25/13 1130 08/25/13 1532  TROPONINI <0.30 <0.30   BNP: No components found with this basename: POCBNP,  CBG:  Recent Labs Lab 08/29/13 1136 08/29/13 1621 08/29/13 2126 08/30/13 0750 08/30/13 1251  GLUCAP 160* 192* 183* 157* 114*    Time coordinating discharge:  Greater than 30 minutes  Signed:  Orson Eva, DO [facility] Pager: [phone] 08/30/2013, 3:50 PM

## 2013-08-30 NOTE — Interval H&P Note (Signed)
Cath Lab Visit (complete for each Cath Lab visit)  Clinical Evaluation Leading to the Procedure:   ACS: no  Non-ACS:    Anginal Classification: CCS III  Anti-ischemic medical therapy: Maximal Therapy (2 or more classes of medications)  Non-Invasive Test Results: Intermediate-risk stress test findings: cardiac mortality 1-3%/year  Prior CABG: No previous CABG      History and Physical Interval Note:  08/30/2013 11:01 AM  Denise Davis  has presented today for surgery, with the diagnosis of syncope  The various methods of treatment have been discussed with the patient and family. After consideration of risks, benefits and other options for treatment, the patient has consented to  Procedure(s): LEFT HEART CATHETERIZATION WITH CORONARY ANGIOGRAM (N/A) as a surgical intervention .  The patient's history has been reviewed, patient examined, no change in status, stable for surgery.  I have reviewed the patient's chart and labs.  Questions were answered to the patient's satisfaction.     Troy Sine

## 2013-08-30 NOTE — Discharge Instructions (Signed)
Cath Site Care: Radial and Groin  Refer to this sheet in the next few weeks. These instructions provide you with information on caring for yourself after your procedure. Your caregiver may also give you more specific instructions. Your treatment has been planned according to current medical practices, but problems sometimes occur. Call your caregiver if you have any problems or questions after your procedure. HOME CARE INSTRUCTIONS  You may shower the day after the procedure.Remove the bandage (dressing) and gently wash the site with plain soap and water.Gently pat the site dry.  Do not apply powder or lotion to the site.  Do not submerge the affected site in water for 3 to 5 days.  Inspect the site at least twice daily.  Do not flex or bend the affected arm for 24 hours.  No lifting over 5 pounds (2.3 kg) for 5 days after your procedure.  Do not drive home if you are discharged the same day of the procedure. Have someone else drive you.  You may drive 24 hours after the procedure unless otherwise instructed by your caregiver.  Do not operate machinery or power tools for 24 hours.  A responsible adult should be with you for the first 24 hours after you arrive home. What to expect:  Any bruising will usually fade within 1 to 2 weeks.  Blood that collects in the tissue (hematoma) may be painful to the touch. It should usually decrease in size and tenderness within 1 to 2 weeks. SEEK IMMEDIATE MEDICAL CARE IF:  You have unusual pain at the radial site.  You have redness, warmth, swelling, or pain at the radial site.  You have drainage (other than a small amount of blood on the dressing).  You have chills.  You have a fever or persistent symptoms for more than 72 hours.  You have a fever and your symptoms suddenly get worse.  Your arm becomes pale, cool, tingly, or numb.  You have heavy bleeding from the site. Hold pressure on the site. Document Released: 04/20/2010  Document Revised: 06/10/2011 Document Reviewed: 04/20/2010 [facility] Patient Information 2014 [place], [place].

## 2013-08-30 NOTE — Progress Notes (Signed)
Occupational Therapy Note  Pt is currently in a procedure. Will check back at a later time.  Pauline Aus OTR/L [phone]

## 2013-08-30 NOTE — Progress Notes (Signed)
Pt seen & examined - still having SSCP - R sided.   I suspect that the Myoview defect could be false + given her morbid obesity w/ high likelihood of tissue attenuation, but given her continued Sx, agree with plan for LHC; I looked @ the CTA Chest - do not see dissection, but having Radiology over-read makes sense.  CRFs = obesity, DM, HTN +/- dyslipidemia  All ?s involving cath answered.  Leonie Man, MD

## 2013-08-30 NOTE — Care Management Note (Signed)
    Page 1 of 1   08/30/2013     4:09:16 PM CARE MANAGEMENT NOTE 08/30/2013  Patient:  Denise Davis, Denise Davis   Account Number:  [id number]  Date Initiated:  08/30/2013  Documentation initiated by:  GRAVES-BIGELOW,Quinci Gavidia  Subjective/Objective Assessment:   Pt admitted for Chest pain with syncopal episode.     Action/Plan:   Plan for d/c today. Pt is without insurance at this time and does not have a qualifying dx for HHPT services. Pt and RN aware. No HH services at d/c.   Anticipated DC Date:  08/30/2013   Anticipated DC Plan:  [place]  CM consult      Choice offered to / List presented to:             Status of service:  Completed, signed off Medicare Important Message given?  NO (If response is "NO", the following Medicare IM given date fields will be blank) Date Medicare IM given:   Date Additional Medicare IM given:    Discharge Disposition:  HOME/SELF CARE  Per UR Regulation:  Reviewed for med. necessity/level of care/duration of stay  If discussed at [place] of Stay Meetings, dates discussed:    Comments:

## 2013-08-30 NOTE — Progress Notes (Signed)
TR Band deflated and removed per protocol. Pt site level 0. Gauze and tegaderm applied. No complications noted. Pt without complaints.

## 2013-09-10 ENCOUNTER — Encounter: Payer: Self-pay | Admitting: Cardiovascular Disease

## 2013-09-13 ENCOUNTER — Encounter: Payer: Self-pay | Admitting: Physician Assistant

## 2013-09-22 ENCOUNTER — Encounter: Payer: Self-pay | Admitting: Physician Assistant

## 2013-09-25 ENCOUNTER — Emergency Department: Payer: Self-pay | Admitting: Emergency Medicine

## 2013-09-25 LAB — CBC WITH DIFFERENTIAL/PLATELET
Basophil #: 0.1 10*3/uL (ref 0.0–0.1)
Basophil %: 1.2 %
Eosinophil #: 0.2 10*3/uL (ref 0.0–0.7)
Eosinophil %: 3.4 %
HCT: 35.3 % (ref 35.0–47.0)
HGB: 11.4 g/dL — ABNORMAL LOW (ref 12.0–16.0)
Lymphocyte #: 2.2 10*3/uL (ref 1.0–3.6)
Lymphocyte %: 35.6 %
MCH: 27.8 pg (ref 26.0–34.0)
MCHC: 32.2 g/dL (ref 32.0–36.0)
MCV: 86 fL (ref 80–100)
Monocyte #: 0.6 x10 3/mm (ref 0.2–0.9)
Monocyte %: 9.5 %
Neutrophil #: 3 10*3/uL (ref 1.4–6.5)
Neutrophil %: 50.3 %
Platelet: 284 10*3/uL (ref 150–440)
RBC: 4.08 10*6/uL (ref 3.80–5.20)
RDW: 15.5 % — ABNORMAL HIGH (ref 11.5–14.5)
WBC: 6 10*3/uL (ref 3.6–11.0)

## 2013-09-25 LAB — COMPREHENSIVE METABOLIC PANEL
Albumin: 3.5 g/dL (ref 3.4–5.0)
Alkaline Phosphatase: 102 U/L
Anion Gap: 6 — ABNORMAL LOW (ref 7–16)
BILIRUBIN TOTAL: 0.2 mg/dL (ref 0.2–1.0)
BUN: 12 mg/dL (ref 7–18)
CALCIUM: 9 mg/dL (ref 8.5–10.1)
CHLORIDE: 99 mmol/L (ref 98–107)
CO2: 28 mmol/L (ref 21–32)
Creatinine: 1.05 mg/dL (ref 0.60–1.30)
EGFR (African American): >60
EGFR (Non-African Amer.): >60
Glucose: 120 mg/dL — ABNORMAL HIGH (ref 65–99)
Osmolality: 267 (ref 275–301)
Potassium: 4.1 mmol/L (ref 3.5–5.1)
SGOT(AST): 29 U/L (ref 15–37)
SGPT (ALT): 24 U/L (ref 12–78)
SODIUM: 133 mmol/L — AB (ref 136–145)
Total Protein: 7.7 g/dL (ref 6.4–8.2)

## 2013-09-25 LAB — TROPONIN I

## 2013-09-26 LAB — URINALYSIS, COMPLETE
BACTERIA: NONE SEEN
Glucose,UR: NEGATIVE mg/dL (ref 0–75)
Ketone: NEGATIVE
LEUKOCYTE ESTERASE: NEGATIVE
Nitrite: NEGATIVE
Ph: 6 (ref 4.5–8.0)
Protein: NEGATIVE
SPECIFIC GRAVITY: 1.008 (ref 1.003–1.030)
Squamous Epithelial: 1
WBC UR: NONE SEEN /HPF (ref 0–5)

## 2013-09-26 LAB — DRUG SCREEN, URINE
Amphetamines, Ur Screen: NEGATIVE (ref ?–1000)
BARBITURATES, UR SCREEN: NEGATIVE (ref ?–200)
Benzodiazepine, Ur Scrn: POSITIVE (ref ?–200)
Cannabinoid 50 Ng, Ur ~~LOC~~: NEGATIVE (ref ?–50)
Cocaine Metabolite,Ur ~~LOC~~: NEGATIVE (ref ?–300)
MDMA (ECSTASY) UR SCREEN: NEGATIVE (ref ?–500)
Methadone, Ur Screen: NEGATIVE (ref ?–300)
OPIATE, UR SCREEN: NEGATIVE (ref ?–300)
Phencyclidine (PCP) Ur S: NEGATIVE (ref ?–25)
Tricyclic, Ur Screen: POSITIVE (ref ?–1000)

## 2013-09-26 LAB — ETHANOL
Ethanol %: <0.003 % (ref 0.000–0.080)
Ethanol: <3 mg/dL

## 2013-11-16 ENCOUNTER — Emergency Department: Payer: Self-pay | Admitting: Internal Medicine

## 2013-11-16 LAB — COMPREHENSIVE METABOLIC PANEL
ALT: 27 U/L
AST: 22 U/L (ref 15–37)
Albumin: 3.3 g/dL — ABNORMAL LOW (ref 3.4–5.0)
Alkaline Phosphatase: 95 U/L
Anion Gap: 7 (ref 7–16)
BILIRUBIN TOTAL: 0.2 mg/dL (ref 0.2–1.0)
BUN: 14 mg/dL (ref 7–18)
CREATININE: 1.05 mg/dL (ref 0.60–1.30)
Calcium, Total: 9.1 mg/dL (ref 8.5–10.1)
Chloride: 100 mmol/L (ref 98–107)
Co2: 30 mmol/L (ref 21–32)
EGFR (African American): >60
Glucose: 129 mg/dL — ABNORMAL HIGH (ref 65–99)
OSMOLALITY: 276 (ref 275–301)
POTASSIUM: 3.8 mmol/L (ref 3.5–5.1)
Sodium: 137 mmol/L (ref 136–145)
Total Protein: 7.4 g/dL (ref 6.4–8.2)

## 2013-11-16 LAB — CBC
HCT: 34.8 % — AB (ref 35.0–47.0)
HGB: 11.1 g/dL — ABNORMAL LOW (ref 12.0–16.0)
MCH: 27.1 pg (ref 26.0–34.0)
MCHC: 31.8 g/dL — AB (ref 32.0–36.0)
MCV: 85 fL (ref 80–100)
Platelet: 306 10*3/uL (ref 150–440)
RBC: 4.08 10*6/uL (ref 3.80–5.20)
RDW: 16.2 % — ABNORMAL HIGH (ref 11.5–14.5)
WBC: 6.1 10*3/uL (ref 3.6–11.0)

## 2013-11-16 LAB — ACETAMINOPHEN LEVEL

## 2013-11-16 LAB — ETHANOL: Ethanol: <3 mg/dL

## 2013-11-16 LAB — SALICYLATE LEVEL: Salicylates, Serum: <1.7 mg/dL

## 2013-11-16 LAB — TSH: Thyroid Stimulating Horm: 1.31 u[IU]/mL

## 2013-11-17 LAB — URINALYSIS, COMPLETE
BLOOD: NEGATIVE
Bacteria: NONE SEEN
Bilirubin,UR: NEGATIVE
Glucose,UR: NEGATIVE mg/dL (ref 0–75)
Ketone: NEGATIVE
Leukocyte Esterase: NEGATIVE
Nitrite: NEGATIVE
Ph: 7 (ref 4.5–8.0)
Protein: NEGATIVE
RBC, UR: NONE SEEN /HPF (ref 0–5)
Specific Gravity: 1.013 (ref 1.003–1.030)
Squamous Epithelial: 1

## 2013-11-17 LAB — DRUG SCREEN, URINE
Amphetamines, Ur Screen: NEGATIVE (ref ?–1000)
BENZODIAZEPINE, UR SCRN: POSITIVE (ref ?–200)
Barbiturates, Ur Screen: NEGATIVE (ref ?–200)
CANNABINOID 50 NG, UR ~~LOC~~: NEGATIVE (ref ?–50)
COCAINE METABOLITE, UR ~~LOC~~: NEGATIVE (ref ?–300)
MDMA (ECSTASY) UR SCREEN: NEGATIVE (ref ?–500)
METHADONE, UR SCREEN: NEGATIVE (ref ?–300)
Opiate, Ur Screen: NEGATIVE (ref ?–300)
PHENCYCLIDINE (PCP) UR S: NEGATIVE (ref ?–25)
TRICYCLIC, UR SCREEN: POSITIVE (ref ?–1000)

## 2013-12-01 ENCOUNTER — Emergency Department: Payer: Self-pay | Admitting: Student

## 2013-12-01 LAB — COMPREHENSIVE METABOLIC PANEL
Albumin: 3.5 g/dL (ref 3.4–5.0)
Alkaline Phosphatase: 94 U/L
Anion Gap: 9 (ref 7–16)
BILIRUBIN TOTAL: 0.2 mg/dL (ref 0.2–1.0)
BUN: 12 mg/dL (ref 7–18)
CHLORIDE: 99 mmol/L (ref 98–107)
CO2: 27 mmol/L (ref 21–32)
Calcium, Total: 8.9 mg/dL (ref 8.5–10.1)
Creatinine: 1.02 mg/dL (ref 0.60–1.30)
EGFR (African American): >60
EGFR (Non-African Amer.): >60
Glucose: 190 mg/dL — ABNORMAL HIGH (ref 65–99)
Osmolality: 275 (ref 275–301)
Potassium: 3.7 mmol/L (ref 3.5–5.1)
SGOT(AST): 18 U/L (ref 15–37)
SGPT (ALT): 28 U/L
Sodium: 135 mmol/L — ABNORMAL LOW (ref 136–145)
Total Protein: 7.6 g/dL (ref 6.4–8.2)

## 2013-12-01 LAB — URINALYSIS, COMPLETE
Bacteria: NONE SEEN
Bilirubin,UR: NEGATIVE
Blood: NEGATIVE
GLUCOSE, UR: NEGATIVE mg/dL (ref 0–75)
Ketone: NEGATIVE
Leukocyte Esterase: NEGATIVE
Nitrite: NEGATIVE
PH: 6 (ref 4.5–8.0)
Protein: NEGATIVE
Specific Gravity: 1.013 (ref 1.003–1.030)
WBC UR: 2 /HPF (ref 0–5)

## 2013-12-01 LAB — CBC
HCT: 34.2 % — ABNORMAL LOW (ref 35.0–47.0)
HGB: 10.9 g/dL — ABNORMAL LOW (ref 12.0–16.0)
MCH: 27.5 pg (ref 26.0–34.0)
MCHC: 31.9 g/dL — ABNORMAL LOW (ref 32.0–36.0)
MCV: 86 fL (ref 80–100)
Platelet: 262 10*3/uL (ref 150–440)
RBC: 3.97 10*6/uL (ref 3.80–5.20)
RDW: 16.3 % — ABNORMAL HIGH (ref 11.5–14.5)
WBC: 5.3 10*3/uL (ref 3.6–11.0)

## 2013-12-01 LAB — LIPASE, BLOOD: Lipase: 147 U/L (ref 73–393)

## 2013-12-01 LAB — PREGNANCY, URINE: Pregnancy Test, Urine: NEGATIVE m[IU]/mL

## 2013-12-13 ENCOUNTER — Emergency Department: Payer: Self-pay | Admitting: Emergency Medicine

## 2013-12-27 ENCOUNTER — Emergency Department (HOSPITAL_COMMUNITY): Payer: Medicaid Other

## 2013-12-27 ENCOUNTER — Encounter (HOSPITAL_COMMUNITY): Payer: Self-pay | Admitting: Emergency Medicine

## 2013-12-27 ENCOUNTER — Emergency Department (HOSPITAL_COMMUNITY)
Admission: EM | Admit: 2013-12-27 | Discharge: 2013-12-27 | Disposition: A | Discharge status: Home / Self Care | Payer: Medicaid Other | Attending: Emergency Medicine | Admitting: Emergency Medicine

## 2013-12-27 DIAGNOSIS — Z7982 Long term (current) use of aspirin: Secondary | ICD-10-CM | POA: Diagnosis not present

## 2013-12-27 DIAGNOSIS — Z87891 Personal history of nicotine dependence: Secondary | ICD-10-CM | POA: Insufficient documentation

## 2013-12-27 DIAGNOSIS — N898 Other specified noninflammatory disorders of vagina: Secondary | ICD-10-CM | POA: Insufficient documentation

## 2013-12-27 DIAGNOSIS — E119 Type 2 diabetes mellitus without complications: Secondary | ICD-10-CM | POA: Diagnosis not present

## 2013-12-27 DIAGNOSIS — Z3202 Encounter for pregnancy test, result negative: Secondary | ICD-10-CM | POA: Diagnosis not present

## 2013-12-27 DIAGNOSIS — R062 Wheezing: Secondary | ICD-10-CM | POA: Diagnosis not present

## 2013-12-27 DIAGNOSIS — R109 Unspecified abdominal pain: Secondary | ICD-10-CM | POA: Insufficient documentation

## 2013-12-27 DIAGNOSIS — R0602 Shortness of breath: Secondary | ICD-10-CM | POA: Diagnosis not present

## 2013-12-27 DIAGNOSIS — R110 Nausea: Secondary | ICD-10-CM | POA: Insufficient documentation

## 2013-12-27 DIAGNOSIS — Z86718 Personal history of other venous thrombosis and embolism: Secondary | ICD-10-CM | POA: Diagnosis not present

## 2013-12-27 DIAGNOSIS — M549 Dorsalgia, unspecified: Secondary | ICD-10-CM | POA: Insufficient documentation

## 2013-12-27 DIAGNOSIS — Z79899 Other long term (current) drug therapy: Secondary | ICD-10-CM | POA: Insufficient documentation

## 2013-12-27 DIAGNOSIS — IMO0002 Reserved for concepts with insufficient information to code with codable children: Secondary | ICD-10-CM | POA: Diagnosis not present

## 2013-12-27 DIAGNOSIS — I10 Essential (primary) hypertension: Secondary | ICD-10-CM | POA: Diagnosis not present

## 2013-12-27 DIAGNOSIS — Z9851 Tubal ligation status: Secondary | ICD-10-CM | POA: Diagnosis not present

## 2013-12-27 DIAGNOSIS — Z85528 Personal history of other malignant neoplasm of kidney: Secondary | ICD-10-CM | POA: Insufficient documentation

## 2013-12-27 DIAGNOSIS — Z9889 Other specified postprocedural states: Secondary | ICD-10-CM | POA: Insufficient documentation

## 2013-12-27 DIAGNOSIS — Z905 Acquired absence of kidney: Secondary | ICD-10-CM | POA: Insufficient documentation

## 2013-12-27 DIAGNOSIS — F411 Generalized anxiety disorder: Secondary | ICD-10-CM | POA: Diagnosis not present

## 2013-12-27 DIAGNOSIS — R000 Tachycardia, unspecified: Secondary | ICD-10-CM | POA: Diagnosis not present

## 2013-12-27 LAB — COMPREHENSIVE METABOLIC PANEL
ALK PHOS: 98 U/L (ref 39–117)
ALT: 25 U/L (ref 0–35)
AST: 25 U/L (ref 0–37)
Albumin: 4 g/dL (ref 3.5–5.2)
Anion gap: 21 — ABNORMAL HIGH (ref 5–15)
BUN: 14 mg/dL (ref 6–23)
CO2: 21 meq/L (ref 19–32)
Calcium: 9.6 mg/dL (ref 8.4–10.5)
Chloride: 96 mEq/L (ref 96–112)
Creatinine, Ser: 0.82 mg/dL (ref 0.50–1.10)
GFR calc non Af Amer: 83 mL/min — ABNORMAL LOW (ref >90)
GLUCOSE: 193 mg/dL — AB (ref 70–99)
Potassium: 3.8 mEq/L (ref 3.7–5.3)
SODIUM: 138 meq/L (ref 137–147)
Total Bilirubin: 0.4 mg/dL (ref 0.3–1.2)
Total Protein: 8.2 g/dL (ref 6.0–8.3)

## 2013-12-27 LAB — CBC
HEMATOCRIT: 36.4 % (ref 36.0–46.0)
HEMOGLOBIN: 11.9 g/dL — AB (ref 12.0–15.0)
MCH: 27.6 pg (ref 26.0–34.0)
MCHC: 32.7 g/dL (ref 30.0–36.0)
MCV: 84.5 fL (ref 78.0–100.0)
Platelets: 327 10*3/uL (ref 150–400)
RBC: 4.31 MIL/uL (ref 3.87–5.11)
RDW: 16.1 % — ABNORMAL HIGH (ref 11.5–15.5)
WBC: 6.7 10*3/uL (ref 4.0–10.5)

## 2013-12-27 LAB — URINALYSIS, ROUTINE W REFLEX MICROSCOPIC
Glucose, UA: NEGATIVE mg/dL
HGB URINE DIPSTICK: NEGATIVE
Ketones, ur: 15 mg/dL — AB
Nitrite: NEGATIVE
Protein, ur: 30 mg/dL — AB
Specific Gravity, Urine: 1.024 (ref 1.005–1.030)
UROBILINOGEN UA: 0.2 mg/dL (ref 0.0–1.0)
pH: 7 (ref 5.0–8.0)

## 2013-12-27 LAB — PRO B NATRIURETIC PEPTIDE: Pro B Natriuretic peptide (BNP): 20.1 pg/mL (ref 0–125)

## 2013-12-27 LAB — URINE MICROSCOPIC-ADD ON

## 2013-12-27 LAB — I-STAT TROPONIN, ED: TROPONIN I, POC: 0 ng/mL (ref 0.00–0.08)

## 2013-12-27 LAB — WET PREP, GENITAL
Clue Cells Wet Prep HPF POC: NONE SEEN
Trich, Wet Prep: NONE SEEN
YEAST WET PREP: NONE SEEN

## 2013-12-27 LAB — D-DIMER, QUANTITATIVE (NOT AT ARMC)

## 2013-12-27 LAB — POC URINE PREG, ED: Preg Test, Ur: NEGATIVE

## 2013-12-27 MED ORDER — MORPHINE SULFATE 4 MG/ML IJ SOLN
4.0000 mg | Freq: Once | INTRAMUSCULAR | Status: AC
  Administered 2013-12-27: 4 mg via INTRAVENOUS
  Filled 2013-12-27: qty 1

## 2013-12-27 MED ORDER — ONDANSETRON HCL 4 MG/2ML IJ SOLN
4.0000 mg | Freq: Once | INTRAMUSCULAR | Status: AC
  Administered 2013-12-27: 4 mg via INTRAVENOUS
  Filled 2013-12-27: qty 2

## 2013-12-27 MED ORDER — IOHEXOL 300 MG/ML  SOLN
100.0000 mL | Freq: Once | INTRAMUSCULAR | Status: AC | PRN
  Administered 2013-12-27: 100 mL via INTRAVENOUS

## 2013-12-27 MED ORDER — LORAZEPAM 2 MG/ML IJ SOLN
1.0000 mg | Freq: Once | INTRAMUSCULAR | Status: AC
  Administered 2013-12-27: 1 mg via INTRAVENOUS
  Filled 2013-12-27: qty 1

## 2013-12-27 NOTE — ED Notes (Signed)
Patient transported to CT 
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
## 2013-12-27 NOTE — ED Notes (Signed)
Bed: WA02 Expected date:  Expected time:  Means of arrival:  Comments: EMS- SOB, vaginal bleeding
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
## 2013-12-27 NOTE — ED Provider Notes (Signed)
Medical screening examination/treatment/procedure(s) were performed by non-physician practitioner and as supervising physician I was immediately available for consultation/collaboration.   EKG Interpretation None        Houston Siren III, MD 12/27/13 1911

## 2013-12-27 NOTE — Discharge Instructions (Signed)
Flank Pain °Flank pain refers to pain that is located on the side of the body between the upper abdomen and the back. The pain may occur over a short period of time (acute) or may be long-term or reoccurring (chronic). It may be mild or severe. Flank pain can be caused by many things. °CAUSES  °Some of the more common causes of flank pain include: °· Muscle strains.   °· Muscle spasms.   °· A disease of your spine (vertebral disk disease).   °· A lung infection (pneumonia).   °· Fluid around your lungs (pulmonary edema).   °· A kidney infection.   °· Kidney stones.   °· A very painful skin rash caused by the chickenpox virus (shingles).   °· Gallbladder disease.   °HOME CARE INSTRUCTIONS  °Home care will depend on the cause of your pain. In general, °· Rest as directed by your caregiver. °· Drink enough fluids to keep your urine clear or pale yellow. °· Only take over-the-counter or prescription medicines as directed by your caregiver. Some medicines may help relieve the pain. °· Tell your caregiver about any changes in your pain. °· Follow up with your caregiver as directed. °SEEK IMMEDIATE MEDICAL CARE IF:  °· Your pain is not controlled with medicine.   °· You have new or worsening symptoms. °· Your pain increases.   °· You have abdominal pain.   °· You have shortness of breath.   °· You have persistent nausea or vomiting.   °· You have swelling in your abdomen.   °· You feel faint or pass out.   °· You have blood in your urine. °· You have a fever or persistent symptoms for more than 2-3 days. °· You have a fever and your symptoms suddenly get worse. °MAKE SURE YOU:  °· Understand these instructions. °· Will watch your condition. °· Will get help right away if you are not doing well or get worse. °Document Released: 05/09/2005 Document Revised: 12/11/2011 Document Reviewed: 10/31/2011 °ExitCare® Patient Information ©2015 [employer]. This information is not intended to replace advice given to you by your  health care provider. Make sure you discuss any questions you have with your health care provider. ° °

## 2013-12-27 NOTE — ED Notes (Signed)
Pt crying in room, states "just put me to sleep, I can't stand this pain", pt informed doctor will be in shortly and we will get her pain under control
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
## 2014-03-10 ENCOUNTER — Encounter (HOSPITAL_COMMUNITY): Payer: Self-pay | Admitting: Cardiovascular Disease

## 2014-04-18 ENCOUNTER — Emergency Department: Payer: Self-pay | Admitting: Emergency Medicine

## 2014-04-18 LAB — CBC WITH DIFFERENTIAL/PLATELET
BASOS ABS: 0.1 10*3/uL (ref 0.0–0.1)
Basophil %: 0.9 %
EOS ABS: 0.1 10*3/uL (ref 0.0–0.7)
Eosinophil %: 1.1 %
HCT: 39.7 % (ref 35.0–47.0)
HGB: 12.9 g/dL (ref 12.0–16.0)
LYMPHS PCT: 32.6 %
Lymphocyte #: 2.5 10*3/uL (ref 1.0–3.6)
MCH: 28.2 pg (ref 26.0–34.0)
MCHC: 32.6 g/dL (ref 32.0–36.0)
MCV: 87 fL (ref 80–100)
MONOS PCT: 9.5 %
Monocyte #: 0.7 x10 3/mm (ref 0.2–0.9)
NEUTROS PCT: 55.9 %
Neutrophil #: 4.3 10*3/uL (ref 1.4–6.5)
Platelet: 346 10*3/uL (ref 150–440)
RBC: 4.59 10*6/uL (ref 3.80–5.20)
RDW: 16.2 % — AB (ref 11.5–14.5)
WBC: 7.6 10*3/uL (ref 3.6–11.0)

## 2014-04-18 LAB — TROPONIN I: Troponin-I: <0.02 ng/mL

## 2014-04-18 LAB — URINALYSIS, COMPLETE
BACTERIA: NONE SEEN
BILIRUBIN, UR: NEGATIVE
BLOOD: NEGATIVE
GLUCOSE, UR: NEGATIVE mg/dL (ref 0–75)
KETONE: NEGATIVE
Nitrite: NEGATIVE
PH: 5 (ref 4.5–8.0)
Protein: NEGATIVE
RBC, UR: NONE SEEN /HPF (ref 0–5)
SPECIFIC GRAVITY: 1.019 (ref 1.003–1.030)
Squamous Epithelial: 5

## 2014-04-18 LAB — COMPREHENSIVE METABOLIC PANEL
ALK PHOS: 112 U/L
ALT: 23 U/L
AST: 23 U/L (ref 15–37)
Albumin: 3.3 g/dL — ABNORMAL LOW (ref 3.4–5.0)
Anion Gap: 9 (ref 7–16)
BILIRUBIN TOTAL: 0.2 mg/dL (ref 0.2–1.0)
BUN: 15 mg/dL (ref 7–18)
CHLORIDE: 101 mmol/L (ref 98–107)
Calcium, Total: 9 mg/dL (ref 8.5–10.1)
Co2: 29 mmol/L (ref 21–32)
Creatinine: 1.17 mg/dL (ref 0.60–1.30)
EGFR (Non-African Amer.): 52 — ABNORMAL LOW
GLUCOSE: 113 mg/dL — AB (ref 65–99)
Osmolality: 279 (ref 275–301)
POTASSIUM: 3.8 mmol/L (ref 3.5–5.1)
Sodium: 139 mmol/L (ref 136–145)
Total Protein: 7.8 g/dL (ref 6.4–8.2)

## 2014-04-18 LAB — LIPASE, BLOOD: Lipase: 180 U/L (ref 73–393)

## 2014-05-11 ENCOUNTER — Encounter: Payer: Self-pay | Admitting: Family Medicine

## 2014-05-26 ENCOUNTER — Emergency Department: Payer: Self-pay | Admitting: Emergency Medicine

## 2014-05-31 ENCOUNTER — Encounter
Admit: 2014-05-31 | Disposition: A | Discharge status: Home / Self Care | Payer: Self-pay | Attending: Family Medicine | Admitting: Family Medicine

## 2014-06-02 ENCOUNTER — Emergency Department: Payer: Self-pay | Admitting: Emergency Medicine

## 2014-07-19 NOTE — Consult Note (Signed)
PATIENT NAME:  Denise Davis, Denise Davis MR#:  [id number] DATE OF BIRTH:  July 07, 1965  DATE OF CONSULTATION:  01/08/2012  REFERRING PHYSICIAN:   CONSULTING PHYSICIAN:  Micheline Maze, MD  PRIMARY CARE PHYSICIAN: Delight Stare, MD  CHIEF COMPLAINT: Abdominal pain.   BRIEF HISTORY: Denise Davis is a 49 year old woman seen in the Emergency Room with symptomatic umbilical hernia. She has a complicated medical history.  In 2009 she underwent a left nephrectomy for renal cell carcinoma through a lateral incision. The procedure was performed at [facility] and she has done well post surgery. She has had multiple abdominal pain issues since that time. In reviewing her chart, she has had multiple CT scans performed in the last two years for abdominal pain. She was seen in the fall of 2012 by our service. At that time, she had a reducible umbilical hernia which was symptomatic. She had cramping and left lower quadrant abdominal pain. Her symptoms resolved without any surgical intervention. She was evaluated a week ago with similar symptoms as her pain had returned and had been going on for approximately 10 days. CT scan was repeated and a small umbilical hernia was noted. There did not appear to be any significant bowel in the defect. There was no evidence of obstruction, although the scan was not performed with p.o. contrast. She has continued to have symptoms since that time and called her primary care physician yesterday requesting referral and the primary care physician suggested she return to the Emergency Room for further evaluation. Plain films were largely unremarkable. She does have a significant amount of stool in her abdomen. Her laboratory values are all unremarkable. The surgical service was consulted for symptomatic umbilical hernia.   She denies history of hepatitis, yellow jaundice, pancreatitis, peptic ulcer disease, gallbladder disease, or diverticulitis. She denies any history of cardiac disease.  She does have a history of diabetes and hypertension. Previous surgery other than her nephrectomy includes uterine ablation and tubal ligation.   CURRENT MEDICATIONS:  1. Valium 5 mg p.o. every 8 hours p.r.n.  2. Metformin 1000 mg twice a day. 3. Glipizide 2.5 mg twice a day. 4. Hydrochlorothiazide 25 mg daily.  5. Clarinex 5 mg p.o. daily.  6. Nexium 40 mg p.o. daily.  7. Accupril 10 mg p.o. daily.  8. Cyclobenzaprine 10 mg p.o. at bedtime p.r.n.  9. Neurontin 600 mg 4 tablets daily.  10. Promethazine 50 mg p.o. every 6 hours p.r.n.  11. Lovastatin 10 mg p.o. daily.  12. Lisinopril 20 mg p.o. daily.  13. Carvedilol 25 mg twice a day. 14. Aspirin 81 mg p.o. daily.   ALLERGIES: She has no medical allergies.   SOCIAL HISTORY: She is not a cigarette smoker, does not drink alcohol, and is currently unemployed.   FAMILY HISTORY: Noncontributory.  REVIEW OF SYSTEMS: Otherwise unremarkable.   PHYSICAL EXAMINATION:   GENERAL: She is a morbidly obese woman with a BMI of 56.   VITALS: Blood pressure is 124/65, pulse rate 92 and regular, and oxygen saturation is 100%.  HEENT: No scleral icterus, no facial deformities, and no pupillary abnormalities.   NECK: Supple and nontender with no adenopathy and midline trachea.   PULMONARY: Chest is clear with distant breath sounds. She has normal pulmonary excursion and no chest pain.   CARDIAC: No murmurs or gallops. She seems to be in normal sinus rhythm.   ABDOMEN: Grossly obese and nontender with the exception of her periumbilical area. She does have a reducible umbilical hernia  which is tender to palpation. She has no groin hernia.   EXTREMITIES: Lower extremity examination reveals some minimal edema, no deformity, and full range of motion.  PSYCHIATRIC: A very anxious and agitated woman with normal orientation.   ASSESSMENT AND RECOMMENDATIONS: I have independently reviewed her most recent CT scan. It did not demonstrate any evidence  of obstruction. I have talked with her at length. She is adamant about considering elective repair, but she is a terrible candidate for surgical intervention with hernia and her obesity. She will need to lose a significant amount of weight to have a reasonable chance of a durable repair. She does have multiple pain medicine issues and at this point I would continue to treat her symptomatically rather than recommend surgical intervention. I do not see any indication for urgent admission or urgent surgical repair. This plan has been discussed with the patient and the emergency room physician.  ____________________________ Micheline Maze, MD rle:slb D: 01/08/2012 01:13:53 ET T: 01/08/2012 08:58:59 ET JOB#: [id number]  cc: Micheline Maze, MD, <Dictator> Marguerita Merles, MD Rodena Goldmann MD ELECTRONICALLY SIGNED 01/10/2012 22:51

## 2014-07-23 NOTE — Consult Note (Signed)
Brief Consult Note: Diagnosis: Mood disorder NOS.   Patient was seen by consultant.   Consult note dictated.   Recommend further assessment or treatment.   Comments: Ms. Guedes has a h/o depression stable on the Zoloft. She was brought to the ER after she passed out at the [facility]. She has been "passing out" for the past two months. She has full cardiology and neuro work up at [facility] in May/June that was negative. She is not suicidal or homicidal.   PLAN: 1. The patient does not meet criteria for IVC. I will terminate proceedings. Please discharge as appropriate.  2. She is to continue Zoloft as prescribed by her primary psychiatrist.  3. She will follow with her psychiatrist in the community.  Electronic Signatures: Orson Slick (MD)  (Signed 19-Aug-15 11:26)  Authored: Brief Consult Note   Last Updated: 19-Aug-15 11:26 by Orson Slick (MD)

## 2014-07-23 NOTE — Discharge Summary (Signed)
PATIENT NAME:  Denise Davis, Denise Davis MR#:  [id number] DATE OF BIRTH:  1965-07-13  DATE OF ADMISSION:  05/13/2013 DATE OF DISCHARGE:  05/18/2013  ADMITTING DIAGNOSIS: Shortness of breath.   DISCHARGE DIAGNOSES: 1. Shortness of breath felt to be multifactorial and recurrent in nature.  2.  Acute respiratory failure. Etiology is likely due to obstructive sleep apnea, obesity hypoventilation syndrome, possible chronic obstructive pulmonary disease/asthma. Work-up so far has been negative.  3.  Cough, possibly due to ACE inhibitor. Her ACE inhibitor has been stopped.  4.  Vocal cord paralysis, needs outpatient [name] follow-up and supportive therapy. This is a chronic diagnosis for this patient.  5.  Likely sleep apnea. Needs a sleep study again.  6.  Morbid obesity.  Dietary weight loss and a possible bariatric surgery if tolerated.   CONSULTANTS: Pulmonary.   PERTINENT LABS AND EVALUATIONS: Admitting glucose 215, BUN 34, creatinine 1.08, sodium 133, potassium 4.2, chloride 102, CO2 23, magnesium 1.7. LFTs showed albumin of 3.2, AST of 52. CPK 34, CK-MB less than 0.5. Troponin less than 0.02. WBC 10.4, hemoglobin 11.9, platelet count 241. Blood cultures x 2: No growth. ABG: PH of 7.47, pCO2 31, pO2 176.  EKG: Sinus tachycardia with nonspecific T waves. Echocardiogram of the heart showed poor quality. Left ventricular EF 60% to 65%. The patient also had a CT of the chest per PE protocol on 01/24 that showed no evidence of pulmonary embolism. Mild patchy bilateral air space opacities which are nonspecific.   HOSPITAL COURSE:  Please refer to H and P done by the admitting physician. The patient is a 49 year old African American female morbidly obese who has had at least 3 to 4 admissions within the past 2 to 3 months for similar complaints of shortness of breath and presented back with the same complaint. The patient was thought to have likely obstructive sleep apnea, Pickwickian syndrome. She has had evaluation  with a negative CT per PE protocol on the 24th; therefore, was not repeated. She was treated for possible COPD and  exacerbation. The patient was treated with nebulizers and breathing treatments. She also continued to complain of cough; therefore, her lisinopril was discontinued. During this hospitalization, the patient has arranged to have home oxygen which was not done previously. This will benefit her until further work-up is pursued.  She was seen by Pulmonary, who recommended current management. The patient will need a sleep study repeated. She has had one in the past. Also consider arrangement needs to be made for right heart catheter to evaluate for pulmonary artery hypertension.  At this time, the patient is doing stable. Home health will be arranged. Home oxygen will be arranged for the patient.   DISCHARGE MEDICATIONS: Nexium 40 daily, Qvar 80, 1 puff b.i.d., loratadine 10, one  tab p.o. daily, aspirin 81 mg 1 tab p.o. daily, carvedilol 12.5 mg 1 tab p.o. b.i.d., Proventil 2 puffs 4 times a day as needed. Amitriptyline 25, one tabs p.o. at bedtime, fluticasone nasal spray 2 sprays to the nose daily, gabapentin 300 so 6 caps at bedtime, hydrochlorothiazide 25 daily, promethazine 25 q. 4 p.r.n. for nausea, prednisone taper to be finished as previously, sertraline 100 mg 1.5. tab at bedtime, albuterol inhalation 2 times a day, Cheratussin AC 10 mL q. 8 p.r.n. for cough, metformin 1000, one tab p.o. b.i.d., Advair 250/50 one puff b.i.d., Spiriva 18 mcg daily, acetaminophen and oxycodone 325/5 q. 6 p.r.n. for pain.    HOME HEALTH SERVICES:  The patient will have a  nurse, O2 at 2 liters via nasal cannula continuous.   DIET: Low sodium, low fat, low cholesterol, carbohydrate-controlled diet.   ACTIVITY: As tolerated.   FOLLOW-UP:  With primary M.D. in 1 to 2 weeks. The patient is to be seen by Pulmonary as scheduled. Recommend [name] follow-up for her vocal cord paralysis.   45 minutes spent on this  discharge.    ____________________________ Lafonda Mosses. Posey Pronto, MD shp:dp D: 05/19/2013 14:06:15 ET T: 05/19/2013 14:29:51 ET JOB#: [id number]  cc: Tomie Spizzirri H. Posey Pronto, MD, <Dictator> Alric Seton MD ELECTRONICALLY SIGNED 05/21/2013 14:37

## 2014-07-23 NOTE — H&P (Signed)
PATIENT NAME:  Denise Davis, Denise Davis MR#:  [id number] DATE OF BIRTH:  [birth year]  DATE OF ADMISSION:  04/25/2013  REFERRING PHYSICIAN:  Dr. Lavonia Drafts.   PRIMARY CARE PHYSICIAN:  Dr. Delight Stare.   CHIEF COMPLAINT:  Shortness of breath.   HISTORY OF PRESENT ILLNESS:  Denise Davis is a 49 year old morbidly obese female with history of renal cell CA status post nephrectomy, hypertension, hyperlipidemia, diabetes mellitus, paradoxical vocal cord dysfunction, presented to the Emergency Department with complaints of shortness of breath that started for the last one week.  The patient has been having cough, mostly dry cough.  Did not have any fever.  The shortness of breath has been gradually getting worse.  Concerning this, came to the Emergency Department.  The patient states has pain taking a deep breath.  The patient received multiple doses of pain medication as well as Ativan.  The patient also received multiple breathing treatments.  As per the Emergency Department physician the patient was diffusely wheezing and placed on CPAP initially and was taken off of CPAP after the improvement of the breathing with nebulizers.  CT of the chest showed mild bilateral patchy air space opacities.  ABGs well within normal limits while on BiPAP.   PAST MEDICAL HISTORY: 1.  Previous history of DVT. 2.  Paradoxical vocal cord dysfunction. 3.  Anxiety. 4.  Renal cell CA status post nephrectomy.  5.  Hypertension.  6.  Diabetes mellitus, type 2.  7.  Tubal ligation.  .  ALLERGIES:  MILK.  HOME MEDICATIONS: 1.  Xanax 1 mg as needed.  2.  Sertraline 150 mg once a day.  3.  QVAR 1 puff 2 times a day.  4.  Proventil 2 puffs 4 times a day.  5.  Prednisone 40 mg once a day.  6.  Phenergan 25 mg every four hours as needed.  7.  Nexium 40 mg once a day.  8. Metformin 1000 mg two times a day.  9.  Loratadine 10 mg once a day. 10.  Lisinopril 20 mg once a day. 11.  Hydrochlorothiazide 25 mg once a day.  12.   Gabapentin 1800 mg at bedtime.  13.   10 mL every eight hours as needed. 14.  Coreg 12.5 mg 2 times a day.  15.  Aspirin 81 mg once a day.  16.  Amitriptyline 25 mg once a day.   SOCIAL HISTORY:  Former smoker, quit 30 years back.  Denies drinking alcohol or using illicit drugs.  In the past worked as a Radio producer.  Currently is disabled.   FAMILY HISTORY:  History of hypertension and diabetes mellitus.   REVIEW OF SYSTEMS: CONSTITUTIONAL:  The patient states that unable to speak secondary vocal cord dysfunction for the last 8 months.  EYES:  No change in vision.  EARS, NOSE, THROAT:  No change in hearing.  RESPIRATORY:  Has cough, shortness of breath.  CARDIOVASCULAR:  No chest pain, palpitations.  GASTROINTESTINAL:  No nausea, vomiting, abdominal pain.  GENITOURINARY:  No dysuria or hematuria.  ENDOCRINE:  No polyuria or polydipsia.  SKIN:  No rashes or lesions.  MUSCULOSKELETAL:  Chronic back pain.   NEUROLOGIC:  No weakness or numbness in any part of the body.   PHYSICAL EXAMINATION: GENERAL:  This is a well-built, well-nourished morbidly obese female lying down in the bed moaning, states that unable to speak full sentences, has good oxygen saturations.  VITAL SIGNS:  Temperature 97.8, pulse 87, blood pressure 162/93, respiratory rate of 16,  oxygen saturation 100% on 2 liters of oxygen.  HEENT:  Head atraumatic, normocephalic.  Eyes, no scleral icterus.  Conjunctivae normal.  Pupils equal and react to light.  Extraocular movements are intact.  Mucous membranes could not examine secondary to the patient's body habitus.  NECK:  Supple.  No lymphadenopathy.  No JVD.  No carotid bruit.  Thick neck.  CHEST:  Has no focal tenderness.  Bilateral diffuse wheezing.   HEART:  S1, S2, regular.  Tachycardia.  ABDOMEN:  Bowel sounds plus.  Soft, nontender, nondistended.  EXTREMITIES:  No pedal edema.  Pulses 2+.  NEUROLOGIC:  The patient is alert, oriented to place, person and time.   Cranial nerves II through XII intact.  Motor 5 by 5 in upper and lower extremities.   LABORATORY DATA:  ABG, pH of 7.49, pCO2 of 31, pO2 of 337.  CBC is completely within normal limits.    CMP is completely within normal limits.   Troponin less than 0.02.  BNP 141.    Chest x-ray, one view portable:  Low lung volumes, symmetric elevation of the right hemidiaphragm.    CTA of the chest, patchy mild patchy bilateral airspace opacities.   ASSESSMENT AND PLAN:  Denise Davis is a 49 year old female who comes to the Emergency Department with shortness of breath.  1.  Chronic obstructive pulmonary disease exacerbation.  Continue with the breathing treatments, Solu-Medrol and levofloxacin.  2.  Morbid obesity.  Counseled with the patient regarding diet and exercise.  3.  Diabetes mellitus, on metformin.  We will hold the metformin as the patient received contrast.  4.  Hypertension.  Continue the lisinopril, hydrochlorothiazide.  5.  Keep the patient on deep vein thrombosis prophylaxis with Lovenox.   TIME SPENT:  45 minutes.     ____________________________ Monica Becton, MD pv:ea D: 04/25/2013 00:23:00 ET T: 04/25/2013 01:36:02 ET JOB#: [id number]  cc: Monica Becton, MD, <Dictator> Marguerita Merles, MD Monica Becton MD ELECTRONICALLY SIGNED 05/08/2013 20:41

## 2014-07-23 NOTE — Discharge Summary (Signed)
PATIENT NAME:  Denise Davis, HESSER MR#:  [id number] DATE OF BIRTH:  Apr 07, 1965  DATE OF ADMISSION:  05/02/2013 DATE OF DISCHARGE:  05/06/2013  ADMISSION DIAGNOSES:  1. Sepsis.  2. Type 2 diabetes.   DISCHARGE DIAGNOSES: 1. Sepsis which has improved, likely secondary to acute reactive airway disease/bronchitis.  2. Type 2 diabetes.  3. Hypertension. 4. Gastroesophageal reflux disease.  5. Cough.   CONSULTATIONS: None.   Chest x-ray showed no acute cardiopulmonary disease.   HOSPITAL COURSE: This is a 49 year old female who was recently discharged with pneumonia seen on CT scan who presented with shortness of breath, sepsis, leukocytosis increased respiratory rate. For further details, please refer to H and P.  1. Sepsis.  The patient presented with an elevated heart rate, leukocytosis and respiratory rate due to reactive airway disease/bronchitis with no evidence of pneumonia on chest x-ray. Blood culture negative to date. She was empirically placed on Augmentin. She had been DC'd with Levaquin. She was on and off of BiPAP on the first day with oxygen requirement. She is now off of oxygen, doing well. On discharge her lungs are clear to auscultation without crackles, rales, rhonchi, or wheezing.  2. Type 2 diabetes. The patient to continue her outpatient medications.  3. Hypertension. The patient continue on Coreg.  4. GERD. The patient will continue on a PPI.  5. Cough, likely bacterial/viral in nature. I did discharge the patient with Flonase as well. She is already on a PPI.   DISCHARGE MEDICATIONS:  1. Cheratussin 10 mL q.8 hours p.r.n.  2. Gabapentin 1800 mg at bedtime.  3. Nexium 40 mg daily. 4. Xanax 1 mg t.i.d. p.r.n. anxiety.  5. Metformin 1000 mg b.i.d.  6. Amitriptyline 25 mg 1 to 4 tablets 2 to 3 hours before bed.  7. Qvar 1 puff b.i.d.  8. Loratadine 10 mg daily.  9. Aspirin 81 mg daily.  10. Coreg 12.5 b.i.d.  11. Phenergan 25 mg q.4h. p.r.n.  12. Proventil 2 puffs 4  times a day as needed.  13. Zoloft 10 mg daily.  14. Prednisone taper starting at 50 mg, taper x 10 mg every two days.  15. Hydrochlorothiazide  25 mg 2 tablets daily.  16. Fluticasone two sprays daily.  17. Augmentin 875 b.i.d. x 3 days.   We have stopped lisinopril due to her cough. Discharge home with home health and physical therapy, nurse and nurse aide.   DISCHARGE DIET: ADA diet.   DISCHARGE ACTIVITY: As tolerated.   DISCHARGE FOLLOW-UP: The patient will follow up Delight Stare in two weeks.   TIME SPENT: 35 minutes.   The patient is medically stable for discharge.   ____________________________ Dayln Tugwell P. Benjie Karvonen, MD spm:sg D: 05/06/2013 14:10:08 ET T: 05/06/2013 14:49:13 ET JOB#: [id number]  cc: Jamiyla Ishee P. Benjie Karvonen, MD, <Dictator> Marguerita Merles, MD   Donell Beers Earlee Herald MD ELECTRONICALLY SIGNED 05/06/2013 21:33
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
## 2014-07-23 NOTE — Discharge Summary (Signed)
PATIENT NAME:  Denise Davis, Denise Davis MR#:  [id number] DATE OF BIRTH:  [birth year]  DATE OF ADMISSION:  04/24/2013 DATE OF DISCHARGE:  04/26/2013  ADMISSION DIAGNOSIS:  Acute respiratory failure.   DISCHARGE DIAGNOSES: 1.  Acute respiratory failure with tachypnea and hypoxia on admission.  2.  Community-acquired pneumonia. 3.  Acute bronchospasm.  4.  Vocal cord paralysis, paradoxical. 5.  Hypertension.  6.  Diabetes.   CONSULTATIONS: None.   LABORATORY AND RADIOLOGICAL DATA: Sodium 134, potassium 3.9, chloride 102, bicarb 26, BUN 21, creatinine 1.09, glucose is 188.  CT scan showed no evidence of PE, did show possible pneumonia.   HOSPITAL COURSE: This is a 49 year old female with a history of paradoxical vocal cord paralysis who presented with respiratory failure.  For further details, please refer to the H and P.  1.  Acute respiratory failure. The patient presented with tachypnea and hypoxia on admission. Her CT scan did not show pulmonary emboli but it was consistent with a community-acquired pneumonia. The patient was started on Levaquin, also some IV steroids for acute bronchospasms.  Her respiratory issues have resolved. She is much better. She is not requiring oxygen.  2.  Community-acquired pneumonia.  The patient will continue on Levaquin.  3.  Acute bronchospasm, improved.  4.  Vocal cord paralysis which is paradoxical.  5.  Hypertension. The patient is continued on her outpatient medications.  6.  Diabetes. The patient's metformin was initially held due to the CT scan that she had.   DISCHARGE MEDICATIONS: 1.  Cheratussin 10 mL q.8 hours p.r.n.  2.  Gabapentin 800 mg at bedtime.  3.  Nexium 40 mg daily.  4.  Xanax 1 mg p.r.n. nervousness.  5.  HCTZ 25 mg daily.  6.  Metformin 1000 mg b.i.d.  7.  Amitriptyline 25 mg 4 tablets 2 to 3 hours before bed.  8.  Qvar 88 mcg 1 puff b.i.d.  9.  Loratadine 10 mg daily.  10.  Aspirin 81 mg daily.  11.  Coreg 12.5 mg b.i.d.  12.   Lisinopril 20 mg daily.   13.  Phenergan 25 mg q.4 hours p.r.n. nausea and vomiting.  14.  Proventil 2 puffs 4 times a day p.r.n.  15.  Zoloft 100 mg 2 tablets at bedtime.  16.  Prednisone taper starting at 40 mg, taper by 10 mg every 2 days.  17.  Levaquin 750 mg daily for 7 days.   DISCHARGE DIET: Low sodium.   DISCHARGE ACTIVITY: As tolerated.   DISCHARGE FOLLOWUP: The patient will follow up with Delight Stare in 1 week.   TIME SPENT: 35 minutes. The patient is medically stable for discharge.   ____________________________ Eira Alpert P. Benjie Karvonen, MD spm:cs D: 04/26/2013 13:57:18 ET T: 04/26/2013 15:34:25 ET JOB#: [id number]  cc: [name] P. Benjie Karvonen, MD, <Dictator> Marguerita Merles, MD Donell Beers Temica Righetti MD ELECTRONICALLY SIGNED 04/27/2013 15:24

## 2014-07-23 NOTE — H&P (Signed)
PATIENT NAME:  Denise Davis, Denise Davis MR#:  [id number] DATE OF BIRTH:  01/25/1966  DATE OF ADMISSION:  05/13/2013  PRIMARY CARE PHYSICIAN: Delight Stare, MD  CHIEF COMPLAINT: "I can't breathe."   HISTORY OF PRESENT ILLNESS: This is a 49 year old female who is now back in the hospital for a third time in a short period of time. Recent admissions for shortness of breath and wheeze. The patient has been given antibiotics during both hospital courses and started on high-dose steroids.   She was recently discharged on the 5th but was unable to fill her antibiotics or steroids until yesterday. The patient got worse and worse since leaving the hospital.   In the ER, she is on BiPAP in order to oxygenate. She states that she has been using her albuterol inhaler more often. She does have tightness in the chest, wheezing in the upper airway. Her oxygen saturations dropped down to 86% while lying down. She has been taking the QVAR  inhaler at home. The chest pain is described as a tightness that has been constant since the last few days.   On one of the previous hospitalizations, she did have a CT scan of the chest which was negative for pulmonary embolism.   PAST MEDICAL HISTORY: Vocal cord paralysis, anxiety, hypertension, diabetes, renal cell carcinoma status post nephrectomy, and obesity with a BMI of 61.6.   PAST SURGICAL HISTORY: Kidney removal, hernia repair.   ALLERGIES: No known drug allergies.   MEDICATIONS: Include albuterol 3 mL inhaled twice a day; amitriptyline 25 mg one to 4 tablets at bedtime; Augmentin just filled yesterday, 875/125 one tablet twice a day for 3 more days; aspirin 81 mg daily; Coreg 12.5 mg twice a day; Cheratussin a.c. 10 mL every 8 hours as needed for cough; Flonase nasal spray 2 sprays each nostril daily; gabapentin 300 mg six capsules at bedtime, hydrochlorothiazide 25 mg daily; lisinopril 20 mg daily; loratadine 10 mg daily; metformin 1000 mg twice a day; Nexium 40 mg daily;  prednisone taper of 10 mg starting with 5 tablets for the past 2 days; promethazine 25 mg every 4 hours as needed for nausea and vomiting; Proventil HFA 2 puffs four times a day as needed for shortness of breath; QVAR 80 mcg/inhalations 1 puff twice a day; Zoloft 150 mg at bedtime.   FAMILY HISTORY: Mother is healthy. Father died of cancer of the throat,   SOCIAL HISTORY: Remote tobacco abuse. No alcohol or drug use. Currently not working.   REVIEW OF SYSTEMS:  CONSTITUTIONAL: Slight fever, slight chills. Positive for weight loss. Positive for weakness.  EYES: Does have blurry vision. [name]: No hearing loss. No sore throat. No difficulty swallowing.  CARDIOVASCULAR: Positive for chest pain. No palpitations.  RESPIRATORY: Positive for shortness of breath, wheeze, cough, nonproductive.  GASTROINTESTINAL: Positive for nausea but no vomiting. No abdominal pain. No diarrhea. No constipation. No bright red blood per rectum. No melena.  GENITOURINARY: No burning on urination. No hematuria.  MUSCULOSKELETAL: Positive for knee pain.  PSYCHIATRIC: Positive for panic attacks and anxiety.  NEUROLOGIC: Positive for fainting.  ENDOCRINE: No thyroid problems.  HEMOLYMPHATIC: No anemia. No easy bruising or bleeding.   PHYSICAL EXAMINATION:  GENERAL: Obese female, lying in bed on BiPAP. Slight use of accessory muscles to breathe. VITAL SIGNS: Temperature 99.2, pulse 117, respirations 28, blood pressure 134/83, pulse oximetry 95% on the BiPAP.  GENERAL: Slight respiratory distress.  EYES: Conjunctivae and lids normal. Pupils equal, round, and reactive to light. Extraocular muscles intact. No  nystagmus.  [name]: Tympanic membranes: No erythema. Nasal mucosa: No erythema.  THROAT: No erythema. No exudate seen. Lips and gums: No lesions.  NECK: No JVD. No bruits. No lymphadenopathy. No thyromegaly. No thyroid nodules palpated.  RESPIRATORY: Poor air entry bilaterally. Slight expiratory wheeze, slight use of  accessory muscles to breathe.  CARDIOVASCULAR SYSTEM: S1 and S2, tachycardic. No gallops, rubs, or murmurs heard. Carotid upstroke 2+ bilaterally. No bruits. Dorsalis pedis pulses 2+ bilaterally, 2+ edema bilateral lower extremity.  ABDOMEN: Soft, nontender. No organomegaly/splenomegaly. Normoactive bowel sounds. No masses felt.  LYMPHATIC: No lymph nodes in the neck.  MUSCULOSKELETAL: Has 2+ edema. No clubbing. No cyanosis on BiPAP.  SKIN: No ulcers or lesions seen.  NEUROLOGIC: Cranial nerves II through XII grossly intact. Deep tendon reflexes 2+ bilateral lower extremity.  PSYCHIATRIC: Oriented to person, place, and time.   DIAGNOSTIC STUDIES: Chest x-ray showed no cardiopulmonary disease.   Magnesium 1.7. Troponin less than 0.2. Glucose 215, BUN 21, creatinine 1.08, sodium 133, potassium 4.2, chloride 102, CO2 23, calcium 9.0. Liver function tests: AST slightly elevated at 52. White blood cell count 10.4, H and H 11.9 and 36.7, platelet count of 241. BNP 34. Troponin again was negative. ABG showed a pH of 7.47, pCO2 of 31, pO2 176. That is on 35% BiPAP. Oxygen saturation 99.7.   EKG showed a sinus tachycardia, nonspecific ST-T-wave change.   ASSESSMENT AND PLAN:  1.  Likely chronic obstructive pulmonary disease exacerbation. Could also be a restrictive component with vocal cord paralysis and morbid obesity. This is acute respiratory failure requiring BiPAP initially. I will hold off on further imaging studies, continue the patient's Augmentin from last hospital stay, give DuoNeb nebulizer solution, IV Solu-Medrol. Will give budesonide nebulizers also. Will get a pulmonary consultation. The patient will likely need PFTs as an outpatient. The patient states that she had a sleep study as outpatient that showed a mild sleep apnea. Part of the problem that keeps bringing her in is she was unable to fill her antibiotic and steroid the day after discharge and that may have worsened things. She just  filled the antibiotic and steroid yesterday.  2.  Hypomagnesemia. Replace magnesium IV.  3.  Hypertension. Continue blood pressure medications.  4.  Diabetes. Continue Glucophage and sliding scale. Sugars may be high while on IV steroids.  5.  Panic attacks. The patient is on a lot of psychiatric medications. I will continue that for now.  6.  Gastroesophageal reflux disease, on Nexium.  7.  Morbid obesity with a BMI of 61.6. Weight loss is definitely needed but will be hard for this patient with her chronic knee pain.   TIME SPENT ON ADMISSION: 55 minutes.   CODE STATUS: FULL CODE.   ____________________________ Tana Conch. Leslye Peer, MD rjw:[id number] D: 05/13/2013 18:59:30 ET T: 05/13/2013 19:17:10 ET JOB#: [id number]  cc: Tana Conch. Leslye Peer, MD, <Dictator> Marguerita Merles, MD Marisue Brooklyn MD ELECTRONICALLY SIGNED 05/22/2013 14:06

## 2014-07-23 NOTE — H&P (Signed)
PATIENT NAME:  Denise Davis, Denise Davis MR#:  [id number] DATE OF BIRTH:  1965/10/26  DATE OF ADMISSION:  05/03/2013  REFERRING PHYSICIAN: Dr. Jacqualine Code.   PRIMARY CARE PHYSICIAN: Dr. Delight Stare.   CHIEF COMPLAINT: Shortness of breath/wheeze.   HISTORY OF PRESENT ILLNESS: A 49 year old African American female with past medical history of vocal cord paralysis, hypertension, diabetes, presenting with shortness of breath. Recently discharged from [facility] less than 1 week ago with discharge diagnosis of community-acquired pneumonia. She was discharged on Levaquin as well as steroid taper. The patient and husband at bedside state initial improvement as far as cough and breathing; however, about 2 days after discharge, she began noticing shortness of breath, mainly dyspnea on exertion with associated wheezing. Her cough has been stable, though somewhat worsening as far as frequency over the last few days as well. Cough is nonproductive. In the last 1 or 2 days, she has subjective fevers and chills. She does have a history of positive sick contacts. The patient has difficulty providing information as she is currently on BiPAP therapy and does have vocal cord paralysis, giving her a very weak voice at baseline.   REVIEW OF SYSTEMS:   CONSTITUTIONAL: Positive for fevers, chills, fatigue.  EYES: Denies blurred vision, double vision, eye pain.  EARS, NOSE, THROAT: Denies tinnitus, ear pain or hearing loss.  RESPIRATORY: Positive for cough, wheeze, shortness of breath as described above.  CARDIOVASCULAR: Denies chest pain, palpitations, edema.  GASTROINTESTINAL: Denies nausea, vomiting, diarrhea, abdominal pain.  GENITOURINARY: Denies dysuria, hematuria.  ENDOCRINE: Denies nocturia or thyroid problems.  HEMATOLOGIC AND LYMPHATIC: Denies easy bruising, bleeding.  SKIN: Denies rash, lesions.  MUSCULOSKELETAL: Denies pain in neck, back, shoulder, knees, hips or arthritic symptoms.  NEUROLOGIC: Denies paralysis,  paresthesias.  PSYCHIATRIC: Denies anxiety or depressive symptoms.   Otherwise, full review of systems performed by me is negative.   PAST MEDICAL HISTORY: Vocal cord paralysis, anxiety, hypertension, history of renal cell carcinoma status post nephrectomy, type 2 diabetes noninsulin requiring.   SOCIAL HISTORY: Remote tobacco abuse. Negative for alcohol or drug use.   FAMILY HISTORY: Positive for hypertension and diabetes.   ALLERGIES: No known drug allergies.   HOME MEDICATIONS: She is currently on a prednisone taper taking 30 mg daily, aspirin 81 mg p.o. daily, lisinopril 20 mg p.o. daily, gabapentin 1800 mg p.o. at bedtime, amitriptyline 25 mg 1 to 4 tablets 2 to 3 hours before bed as needed, sertraline 100 mg 2 tablets p.o. at bedtime, metformin 1000 mg p.o. b.i.d., Phenergan 25 mg every 4 hours as needed for nausea, vomiting, loratadine 10 mg p.o. daily, Xanax 1 mg p.o. as needed for anxiety, Coreg 12.5 mg p.o. b.i.d., Proventil 90 mcg inhalation 2 puffs 4 times daily as needed for shortness of breath and wheezing, hydrochlorothiazide 25 mg p.o. daily, Nexium 40 mg p.o. daily, Levaquin 750 mg p.o. daily, Qvar 80 mcg inhalation 1 puff b.i.d., Cheratussin 10/100 mg 10 mL every 8 hours as needed for cough.   PHYSICAL EXAMINATION:  VITAL SIGNS: Temperature 101, heart rate 138, respirations 28, blood pressure 115/92, saturating 96% on room air. Weight 162.8 kg, BMI 59.7.  GENERAL: Obese African American female in minimal distress secondary to respiratory status with tachypnea.  HEAD: Normocephalic, atraumatic.  EYES: Pupils are equal, round and reactive to light. Extraocular muscles intact. No scleral icterus.  MOUTH: Moist mucosal membranes. Dentition intact. No abscess noted.  EARS, NOSE, THROAT: Throat clear without exudates. No external lesions.  NECK: Supple. No thyromegaly. No nodules.  No JVD.  PULMONARY: Prolonged expiratory phase with diffuse wheezing, most prominent on the left and  right upper, sparing right lower. Currently on BiPAP therapy with good effort. No use of accessory muscles.  CHEST: Nontender to palpation.  CARDIOVASCULAR: S1, S2, regular rate and rhythm. No murmurs, rubs or gallops. No edema. Pedal pulses 2+ bilaterally.  GASTROINTESTINAL: Soft, nontender, nondistended. No masses. Obese. Positive bowel sounds. No hepatosplenomegaly.  MUSCULOSKELETAL: No swelling, clubbing, edema. Range of motion full in all extremities.  NEUROLOGIC: Cranial nerves II through XII intact. No gross focal neurological deficits. Sensation intact. Reflexes intact.  SKIN: No ulcerations, lesions, rash, cyanosis. Skin warm, dry. Turgor intact.  PSYCHIATRIC: Mood and affect within normal limits. The patient is awake, appears to be oriented x 3; however, difficult to fully assess given the patient's vocal cord paralysis and current BiPAP therapy. Her insight and judgment appear to be intact.   LABORATORY DATA: Sodium 132, potassium 4.3, chloride 99, bicarb 28, BUN 18, creatinine 1.18, glucose 154. LFTs normal limits. WBC 16.6, hemoglobin 13.3, platelets 405. Influenza negative. Chest x-ray performed. Chest x-ray reveals small bilateral pleural effusions, greater on the left as compared to the right, with associated compressive atelectasis; however, no frank infiltrate is noted. Exam is somewhat limited by body habitus.   ASSESSMENT AND PLAN: A 49 year old [place] American female with history of vocal cord paralysis, hypertension, diabetes who is presenting with shortness of breath and wheezing.  1. Sepsis: Meeting septic criteria by heart rate, leukocytosis and respiratory rate, of likely pulmonary source. Blood cultures and sputum cultures have been obtained; however, she has a nonproductive cough. Antibiotic coverage with azithromycin and Zosyn. Difficult to exclude pneumonia based off chest x-ray given body habitus and poor read. Change steroid therapy to increase to Solu-Medrol 60 mg daily.  DuoNeb therapy q.4 hours. Supplemental O2 to keep oxygen saturation greater than 92%. BiPAP therapy as required. Incentive spirometry. Check Streptococcus and Legionella urinary antigens.  2. Type 2 diabetes: Hold p.o. agents. Add insulin sliding scale and q.6 hour Accu-Cheks.  3. Hypertension: Continue with Coreg, hydrochlorothiazide, lisinopril.  4. Gastroesophageal reflux disease: Continue proton pump inhibitor therapy.  5. Venous thromboembolism prophylaxis with heparin subcutaneous.   The patient is FULL CODE.   TIME SPENT: 45 minutes.    ____________________________ Aaron Mose. Hower, MD dkh:gb D: 05/02/2013 23:34:12 ET T: 05/03/2013 05:17:56 ET JOB#: [id number]  cc: Aaron Mose. Hower, MD, <Dictator> DAVID Woodfin Ganja MD ELECTRONICALLY SIGNED 05/03/2013 21:46

## 2014-08-16 ENCOUNTER — Encounter: Payer: Self-pay | Admitting: Emergency Medicine

## 2014-08-16 ENCOUNTER — Emergency Department: Payer: Medicaid Other

## 2014-08-16 ENCOUNTER — Emergency Department
Admission: EM | Admit: 2014-08-16 | Discharge: 2014-08-16 | Disposition: A | Discharge status: Home / Self Care | Payer: Medicaid Other | Attending: Emergency Medicine | Admitting: Emergency Medicine

## 2014-08-16 ENCOUNTER — Other Ambulatory Visit: Payer: Self-pay

## 2014-08-16 DIAGNOSIS — Z79899 Other long term (current) drug therapy: Secondary | ICD-10-CM | POA: Insufficient documentation

## 2014-08-16 DIAGNOSIS — R0602 Shortness of breath: Secondary | ICD-10-CM | POA: Insufficient documentation

## 2014-08-16 DIAGNOSIS — Z7982 Long term (current) use of aspirin: Secondary | ICD-10-CM | POA: Insufficient documentation

## 2014-08-16 DIAGNOSIS — I10 Essential (primary) hypertension: Secondary | ICD-10-CM | POA: Insufficient documentation

## 2014-08-16 DIAGNOSIS — R0781 Pleurodynia: Secondary | ICD-10-CM | POA: Insufficient documentation

## 2014-08-16 DIAGNOSIS — E119 Type 2 diabetes mellitus without complications: Secondary | ICD-10-CM | POA: Insufficient documentation

## 2014-08-16 DIAGNOSIS — R42 Dizziness and giddiness: Secondary | ICD-10-CM | POA: Insufficient documentation

## 2014-08-16 DIAGNOSIS — Z87891 Personal history of nicotine dependence: Secondary | ICD-10-CM | POA: Insufficient documentation

## 2014-08-16 DIAGNOSIS — J9811 Atelectasis: Secondary | ICD-10-CM | POA: Insufficient documentation

## 2014-08-16 LAB — CBC
HCT: 38.6 % (ref 35.0–47.0)
Hemoglobin: 12.3 g/dL (ref 12.0–16.0)
MCH: 26.9 pg (ref 26.0–34.0)
MCHC: 31.8 g/dL — ABNORMAL LOW (ref 32.0–36.0)
MCV: 84.7 fL (ref 80.0–100.0)
PLATELETS: 263 10*3/uL (ref 150–440)
RBC: 4.56 MIL/uL (ref 3.80–5.20)
RDW: 15.2 % — ABNORMAL HIGH (ref 11.5–14.5)
WBC: 5.2 10*3/uL (ref 3.6–11.0)

## 2014-08-16 LAB — BASIC METABOLIC PANEL
Anion gap: 9 (ref 5–15)
BUN: 14 mg/dL (ref 6–20)
CALCIUM: 8.8 mg/dL — AB (ref 8.9–10.3)
CHLORIDE: 102 mmol/L (ref 101–111)
CO2: 27 mmol/L (ref 22–32)
CREATININE: 0.89 mg/dL (ref 0.44–1.00)
GFR calc non Af Amer: >60 mL/min (ref >60)
Glucose, Bld: 139 mg/dL — ABNORMAL HIGH (ref 65–99)
Potassium: 4.4 mmol/L (ref 3.5–5.1)
Sodium: 138 mmol/L (ref 135–145)

## 2014-08-16 LAB — TROPONIN I: Troponin I: <0.03 ng/mL (ref <0.031)

## 2014-08-16 LAB — BRAIN NATRIURETIC PEPTIDE: B NATRIURETIC PEPTIDE 5: 67 pg/mL (ref 0.0–100.0)

## 2014-08-16 MED ORDER — IOHEXOL 350 MG/ML SOLN
100.0000 mL | Freq: Once | INTRAVENOUS | Status: AC | PRN
  Administered 2014-08-16: 100 mL via INTRAVENOUS

## 2014-08-16 MED ORDER — ASPIRIN 81 MG PO CHEW
324.0000 mg | CHEWABLE_TABLET | Freq: Once | ORAL | Status: AC
  Administered 2014-08-16: 324 mg via ORAL

## 2014-08-16 MED ORDER — ONDANSETRON HCL 4 MG/2ML IJ SOLN
INTRAMUSCULAR | Status: AC
  Administered 2014-08-16: 4 mg via INTRAVENOUS
  Filled 2014-08-16: qty 2

## 2014-08-16 MED ORDER — MORPHINE SULFATE 4 MG/ML IJ SOLN
4.0000 mg | Freq: Once | INTRAMUSCULAR | Status: AC
  Administered 2014-08-16: 4 mg via INTRAVENOUS

## 2014-08-16 MED ORDER — SODIUM CHLORIDE 0.9 % IV BOLUS (SEPSIS)
500.0000 mL | INTRAVENOUS | Status: AC
  Administered 2014-08-16: 500 mL via INTRAVENOUS

## 2014-08-16 MED ORDER — ASPIRIN 81 MG PO CHEW
CHEWABLE_TABLET | ORAL | Status: AC
  Administered 2014-08-16: 324 mg via ORAL
  Filled 2014-08-16: qty 4

## 2014-08-16 MED ORDER — IBUPROFEN 600 MG PO TABS: ORAL_TABLET | ORAL | Status: DC

## 2014-08-16 MED ORDER — MORPHINE SULFATE 4 MG/ML IJ SOLN
INTRAMUSCULAR | Status: AC
  Administered 2014-08-16: 4 mg via INTRAVENOUS
  Filled 2014-08-16: qty 1

## 2014-08-16 MED ORDER — ONDANSETRON HCL 4 MG/2ML IJ SOLN
4.0000 mg | Freq: Once | INTRAMUSCULAR | Status: AC
  Administered 2014-08-16: 4 mg via INTRAVENOUS

## 2014-08-16 NOTE — Discharge Instructions (Signed)
You have been seen in the Emergency Department (ED) today for chest pain.  As we have discussed todays test results are normal, including a normal CT angiography of your chest with no evidence of pulmonary embolism, a normal EKG, and normal lab work, but you may require further testing as an outpatient, such as ultrasounds of your legs or additional cardiac testing.  **PLEASE NOTE:  Because of the IV contrast dye you received, please do not take your metformin for 48 hours.  You may then resume your normal dosing.**  Please follow up with the recommended doctor as instructed above in these documents regarding todays emergent visit and your recent symptoms to discuss further management.  Continue to take your regular medications. If you are not doing so already, please also take a daily baby aspirin (81 mg), at least until you follow up with your doctor.  Return to the Emergency Department (ED) if you experience any further chest pain/pressure/tightness, difficulty breathing, or sudden sweating, or other symptoms that concern you.   Chest Pain (Nonspecific) It is often hard to give a specific diagnosis for the cause of chest pain. There is always a chance that your pain could be related to something serious, such as a heart attack or a blood clot in the lungs. You need to follow up with your health care provider for further evaluation. CAUSES   Heartburn.  Pneumonia or bronchitis.  Anxiety or stress.  Inflammation around your heart (pericarditis) or lung (pleuritis or pleurisy).  A blood clot in the lung.  A collapsed lung (pneumothorax). It can develop suddenly on its own (spontaneous pneumothorax) or from trauma to the chest.  Shingles infection (herpes zoster virus). The chest wall is composed of bones, muscles, and cartilage. Any of these can be the source of the pain.  The bones can be bruised by injury.  The muscles or cartilage can be strained by coughing or overwork.  The  cartilage can be affected by inflammation and become sore (costochondritis). DIAGNOSIS  Lab tests or other studies may be needed to find the cause of your pain. Your health care provider may have you take a test called an ambulatory electrocardiogram (ECG). An ECG records your heartbeat patterns over a 24-hour period. You may also have other tests, such as:  Transthoracic echocardiogram (TTE). During echocardiography, sound waves are used to evaluate how blood flows through your heart.  Transesophageal echocardiogram (TEE).  Cardiac monitoring. This allows your health care provider to monitor your heart rate and rhythm in real time.  Holter monitor. This is a portable device that records your heartbeat and can help diagnose heart arrhythmias. It allows your health care provider to track your heart activity for several days, if needed.  Stress tests by exercise or by giving medicine that makes the heart beat faster. TREATMENT   Treatment depends on what may be causing your chest pain. Treatment may include:  Acid blockers for heartburn.  Anti-inflammatory medicine.  Pain medicine for inflammatory conditions.  Antibiotics if an infection is present.  You may be advised to change lifestyle habits. This includes stopping smoking and avoiding alcohol, caffeine, and chocolate.  You may be advised to keep your head raised (elevated) when sleeping. This reduces the chance of acid going backward from your stomach into your esophagus. Most of the time, nonspecific chest pain will improve within 2-3 days with rest and mild pain medicine.  HOME CARE INSTRUCTIONS   If antibiotics were prescribed, take them as directed. Finish them  even if you start to feel better.  For the next few days, avoid physical activities that bring on chest pain. Continue physical activities as directed.  Do not use any tobacco products, including cigarettes, chewing tobacco, or electronic cigarettes.  Avoid drinking  alcohol.  Only take medicine as directed by your health care provider.  Follow your health care provider's suggestions for further testing if your chest pain does not go away.  Keep any follow-up appointments you made. If you do not go to an appointment, you could develop lasting (chronic) problems with pain. If there is any problem keeping an appointment, call to reschedule. SEEK MEDICAL CARE IF:   Your chest pain does not go away, even after treatment.  You have a rash with blisters on your chest.  You have a fever. SEEK IMMEDIATE MEDICAL CARE IF:   You have increased chest pain or pain that spreads to your arm, neck, jaw, back, or abdomen.  You have shortness of breath.  You have an increasing cough, or you cough up blood.  You have severe back or abdominal pain.  You feel nauseous or vomit.  You have severe weakness.  You faint.  You have chills. This is an emergency. Do not wait to see if the pain will go away. Get medical help at once. Call your local emergency services (911 in [place] Do not drive yourself to the hospital. MAKE SURE YOU:   Understand these instructions.  Will watch your condition.  Will get help right away if you are not doing well or get worse. Document Released: 12/26/2004 Document Revised: 03/23/2013 Document Reviewed: 10/22/2007 [facility] Patient Information 2015 [place], [place]. This information is not intended to replace advice given to you by your health care provider. Make sure you discuss any questions you have with your health care provider.  Atelectasis Atelectasis is a collapse of the small air sacs in the lungs (alveoli). When this occurs, all or part of a lung collapses and becomes airless. It can be caused by various things and is a common problem after surgery. The severity of atelectasis will vary depending on the size of the area involved and the underlying cause of the condition. CAUSES  There are multiple causes for atelectasis:     Shallow breathing, particularly if there is an injury to your chest wall or abdomen that makes it painful to take a deep breath. This commonly occurs after surgery.  Obstruction of your airways (bronchi or bronchioles). This may be caused by a buildup of mucus (mucus plug), tumors, blood clots (pulmonary embolus), or inhaled foreign bodies. Mucus plugs occur when the lungs do not expand enough to get rid of mucus.  Outside pressure on the lung. This may be caused by tumors, fluid (pleural effusion), or a leakage of air between the lung and rib cage (pneumothorax).   Infections such as pneumonia.  Scarring in lung tissue left over from previous infection or injury.  Some diseases such as cystic fibrosis. SIGNS AND SYMPTOMS  Often, atelectasis will have no symptoms. When symptoms occur, they include:  Shortness of breath.   Bluish color to your nails, lips, or mouth (cyanosis). DIAGNOSIS  Your health care provider may suspect atelectasis based on symptoms and physical findings. A chest X-ray may be done to confirm the diagnosis. More specialized X-ray exams are sometimes required.  TREATMENT  Treatment will depend on the cause of the atelectasis. Treatment may include:  Purposeful coughing to loosen mucus plugs in the lungs.  Chest  physiotherapy. This consists of clapping or percussion on the chest over the lungs to further loosen mucus plugs.  Postural drainage techniques. This involves positioning your body so your head is lower than your chest. [place] relaxed deep breathing whenever you are sitting down. A good technique is to take a few relaxed deep breaths each time a commercial comes on if you are watching television.  If you were given a deep breathing device (such as an incentive spirometer) or a mucus clearance device, use this regularly as directed by your health care provider.  Try to cough several times a day as directed by your health care  provider.  Perform any chest physiotherapy or postural drainage techniques as directed by your health care provider. If necessary, have someone (such as a family member) assist you with these techniques.  When lying down, lie on the unaffected side to encourage mucus drainage.  Stay physically active as much as possible. SEEK IMMEDIATE MEDICAL CARE IF:   You develop increasing problems with your breathing.   You develop severe chest pain.   You develop severe coughing, or you cough up blood.   You have a fever or persistent symptoms for more than 2-3 days.   You have a fever and your symptoms suddenly get worse.  MAKE SURE YOU:  Understand these instructions.  Will watch your condition.  Will get help right away if you are not doing well or get worse. Document Released: 03/18/2005 Document Revised: 03/23/2013 Document Reviewed: 09/23/2012 [facility] Patient Information 2015 [place], [place]. This information is not intended to replace advice given to you by your health care provider. Make sure you discuss any questions you have with your health care provider.

## 2014-08-16 NOTE — ED Notes (Signed)
Patient to ED with c/o sharp substernal chest pain that radiates through to back and up into jaw. Reports pain started on Sunday and has been intermittent since then. Patient further reports increased shortness of breath and dizziness.

## 2014-08-16 NOTE — ED Notes (Signed)
Unsuccessful attempt to start IV.

## 2014-08-16 NOTE — ED Notes (Signed)
Patient transported to CT 

## 2014-08-16 NOTE — ED Provider Notes (Signed)
[facility] Emergency Department Provider Note  ____________________________________________  Time seen: Approximately 6:36 PM  I have reviewed the triage vital signs and the nursing notes.   HISTORY  Chief Complaint Chest Pain; Shortness of Breath; and Dizziness    HPI Denise Davis is a 49 y.o. female with a history that includes morbid obesity and DVTsnot currently on anticoagulation except for baby aspirin.  She presents with sharp substernal chest pain and shortness of breath for several days.  It started about 2 days ago and has persisted but was worse today.  She also reports some dizziness, and the shortness of breath has been worse today particularly with deep inspiration.  The symptoms are severe.  She has not had these types of symptoms in the past.  She denies nausea, vomiting, abdominal pain.  She has not had any fever or chills.  She does not use any estrogen supplements.  She has not had any leg pain or swelling.   Past Medical History  Diagnosis Date  . Diabetes mellitus   . Hypertension   . DVT of leg (deep venous thrombosis) 12/2007    left leg, given warfarin for 6 mos  . Morbid obesity   . Renal cell cancer 2009    S/p nephrectomy (left)  . Pericarditis     Diagnosed/treated at [facility]; per patient 4 episodes per cardiologist Dr. Edwin Dada persistant chest pain unclear if pericarditis verusus neuropsychogenic   . H/O hiatal hernia   . Anxiety   . Shortness of breath     Patient Active Problem List   Diagnosis Date Noted  . Chronic respiratory failure 08/28/2013  . Syncope 08/25/2013  . Right sided weakness 08/25/2013  . Anxiety state, unspecified 08/19/2012  . Vocal cord dysfunction 08/18/2012  . Wheezing 08/16/2012  . Shortness of breath 08/16/2012  . Acute respiratory distress 08/16/2012  . Chest pain 05/09/2011  . Diabetes mellitus   . Hypertension     Past Surgical History  Procedure Laterality Date  . Endometrial  ablation  2007  . Tubal ligation    . Insertion of mesh  2014  . Umbilical hernia repair    . Left heart catheterization with coronary angiogram N/A 08/30/2013    Procedure: LEFT HEART CATHETERIZATION WITH CORONARY ANGIOGRAM;  Surgeon: Troy Sine, MD;  Location: [facility] CATH LAB;  Service: Cardiovascular;  Laterality: N/A;    Current Outpatient Rx  Name  Route  Sig  Dispense  Refill  . amitriptyline (ELAVIL) 50 MG tablet   Oral   Take 150-200 mg by mouth every evening. Take 2-3 hours prior to bedtime         . aspirin EC 81 MG tablet   Oral   Take 81 mg by mouth daily.         . carvedilol (COREG) 12.5 MG tablet   Oral   Take 12.5 mg by mouth 2 (two) times daily with a meal.         . diazepam (VALIUM) 10 MG tablet   Oral   Take 10 mg by mouth 3 (three) times daily as needed for anxiety (anxiety).          Marland Kitchen esomeprazole (NEXIUM) 40 MG capsule   Oral   Take 40 mg by mouth daily.          Marland Kitchen exenatide (BYETTA) 5 MCG/0.02ML SOPN injection   Subcutaneous   Inject 5 mcg into the skin 2 (two) times daily with a meal.         .  gabapentin (NEURONTIN) 300 MG capsule   Oral   Take 1,800 mg by mouth at bedtime.         . gabapentin (NEURONTIN) 600 MG tablet   Oral   Take 1,200 mg by mouth daily.          Marland Kitchen glipiZIDE (GLUCOTROL) 5 MG tablet   Oral   Take 5 mg by mouth 2 (two) times daily before a meal.          . hydrochlorothiazide (HYDRODIURIL) 25 MG tablet   Oral   Take 25 mg by mouth daily.         Marland Kitchen HYDROcodone-acetaminophen (NORCO/VICODIN) 5-325 MG per tablet   Oral   Take 1 tablet by mouth every 6 (six) hours as needed for moderate pain (pain in body).         Marland Kitchen ibuprofen (ADVIL,MOTRIN) 600 MG tablet      Take 1 tablet by mouth three times daily with meals   15 tablet   0   . lisinopril (PRINIVIL,ZESTRIL) 20 MG tablet   Oral   Take 20 mg by mouth daily.         Marland Kitchen loratadine (CLARITIN) 10 MG tablet   Oral   Take 10 mg by mouth at  bedtime.         . metFORMIN (GLUCOPHAGE) 1000 MG tablet   Oral   Take 1,000 mg by mouth 2 (two) times daily with a meal.         . nitroGLYCERIN (NITROSTAT) 0.4 MG SL tablet   Sublingual   Place 0.4 mg under the tongue every 5 (five) minutes as needed for chest pain. (may repeat every 5 minutes but seek medical help if pain persists after 3 tablets)         . promethazine (PHENERGAN) 25 MG tablet   Oral   Take 50 mg by mouth 4 (four) times daily as needed for nausea or vomiting (Nausea).          . sertraline (ZOLOFT) 100 MG tablet   Oral   Take 200 mg by mouth at bedtime.          . triamcinolone (NASACORT AQ) 55 MCG/ACT AERO nasal inhaler   Each Nare   Place 2 sprays into both nostrils daily.            Allergies Milk-related compounds  Family History  Problem Relation Age of Onset  . Hypertension Mother   . Breast cancer Mother   . Heart disease Paternal Grandmother     Social History History  Substance Use Topics  . Smoking status: Former Research scientist (life sciences)  . Smokeless tobacco: Never Used  . Alcohol Use: No    Review of Systems Constitutional: No fever/chills Eyes: No visual changes. [name]: No sore throat. Cardiovascular: Sharp severe substernal chest pain as described above. Respiratory: shortness of breath especially with deep inspiration Gastrointestinal: No abdominal pain.  No nausea, no vomiting.  No diarrhea.  No constipation. Genitourinary: Negative for dysuria. Musculoskeletal: Negative for back pain. Skin: Negative for rash. Neurological: Negative for headaches, focal weakness or numbness.  10-point ROS otherwise negative.  ____________________________________________   PHYSICAL EXAM:  VITAL SIGNS: ED Triage Vitals  Enc Vitals Group     BP 08/16/14 1653 177/100 mmHg     Pulse Rate 08/16/14 1653 91     Resp 08/16/14 1653 20     Temp 08/16/14 1653 98.7 F (37.1 C)     Temp Source 08/16/14 1653 Oral  SpO2 08/16/14 1653 98 %      Weight 08/16/14 1653 360 lb (163.295 kg)     Height 08/16/14 1653 5\' 5"  (1.651 m)     Head Cir --      Peak Flow --      Pain Score 08/16/14 1654 8     Pain Loc --      Pain Edu? --      Excl. in [place]? --     Constitutional: Alert and oriented but appears uncomfortable. Eyes: Conjunctivae are normal. PERRL. EOMI. Head: Atraumatic. Nose: No congestion/rhinnorhea. Mouth/Throat: Mucous membranes are moist.  Oropharynx non-erythematous. Neck: No stridor.   Cardiovascular: Normal rate, regular rhythm. Grossly normal heart sounds.  Good peripheral circulation.  No friction rub. Respiratory: Shallow inspirations with increased rate. Lungs CTAB. Gastrointestinal: Obese, Soft and nontender. No distention. No abdominal bruits. No CVA tenderness. Musculoskeletal: No lower extremity tenderness nor edema.  No joint effusions. Neurologic:  Normal speech and language. No gross focal neurologic deficits are appreciated. Speech is normal. No gait instability. Skin:  Skin is warm, dry and intact. No rash noted. Psychiatric: Patient is anxious and appears uncomfortable and in pain  ____________________________________________   LABS (all labs ordered are listed, but only abnormal results are displayed)  Labs Reviewed  CBC - Abnormal; Notable for the following:    MCHC 31.8 (*)    RDW 15.2 (*)    All other components within normal limits  BASIC METABOLIC PANEL - Abnormal; Notable for the following:    Glucose, Bld 139 (*)    Calcium 8.8 (*)    All other components within normal limits  BRAIN NATRIURETIC PEPTIDE  TROPONIN I   ____________________________________________  EKG   Date: 08/16/2014  Rate: 89  Rhythm: normal sinus rhythm  QRS Axis: normal  Intervals: normal  ST/T Wave abnormalities: normal  Conduction Disutrbances: none  Narrative Interpretation: unremarkable     ____________________________________________  RADIOLOGY  Dg Chest 2 View  08/16/2014   CLINICAL DATA:   chest pain, SOB, dizziness. Diabetic. Denies previous heart or lung issues.  EXAM: CHEST - 2 VIEW  COMPARISON:  12/27/2013  FINDINGS: Obesity. Low lung volumes. Crowding of bronchovascular structures. Can't exclude pulmonary vascular congestion. Patchy infrahilar subsegmental atelectasis versus developing infiltrates. Heart size upper limits normal for technique. No pneumothorax. No effusion. Visualized skeletal structures are unremarkable.  IMPRESSION: 1. Low lung volumes with some increase in bibasilar atelectasis or infiltrates.   Electronically Signed   By: Lucrezia Europe M.D.   On: 08/16/2014 18:41   Ct Angio Chest Pe W/cm &/or Wo Cm  08/16/2014   CLINICAL DATA:  sharp substernal chest pain that radiates through to back and up into jaw. Reports pain started on Sunday and has been intermittent since then. Patient further reports increased shortness of breath and dizziness  EXAM: CT ANGIOGRAPHY CHEST WITH CONTRAST  TECHNIQUE: Multidetector CT imaging of the chest was performed using the standard protocol during bolus administration of intravenous contrast. Multiplanar CT image reconstructions and MIPs were obtained to evaluate the vascular anatomy.  CONTRAST:  126mL OMNIPAQUE IOHEXOL 350 MG/ML SOLN  COMPARISON:  08/25/2013  FINDINGS: Right arm contrast injection. SVC is patent. Satisfactory opacification of pulmonary arteries noted, and there is no evidence of pulmonary emboli. Patent pulmonary veins bilaterally. Adequate contrast opacification of the thoracic aorta with no evidence of dissection, aneurysm, or stenosis. There is classic 3-vessel brachiocephalic arch anatomy without proximal stenosis. No significant atheromatous irregularity or plaque identified.  No hilar or  mediastinal adenopathy. No pleural or pericardial effusion. No pneumothorax. Patchy somewhat geographic ground-glass opacities posteriorly in both lungs . No confluent airspace consolidation or nodule. Minimal spurring in the lower thoracic  spine. Sternum intact. Hepatomegaly, incompletely visualized. Remainder visualized upper abdomen unremarkable.  Review of the MIP images confirms the above findings.  IMPRESSION: 1. Negative for acute PE or thoracic aortic dissection. 2. Geographic ground-glass opacities posteriorly in both lungs, may represent dependent atelectasis or alveolitis. 3. Hepatomegaly   Electronically Signed   By: Lucrezia Europe M.D.   On: 08/16/2014 21:31    ____________________________________________   PROCEDURES  Procedure(s) performed: None  Critical Care performed: No  ____________________________________________   INITIAL IMPRESSION / ASSESSMENT AND PLAN / ED COURSE  Pertinent labs & imaging results that were available during my care of the patient were reviewed by me and considered in my medical decision making (see chart for details).  Concerning for pulmonary embolism.  The patient is hemodynamically stable and her initial hypertension has improved while in the emergency department.  I will obtain CT angiography of her chest and provide anti-emetics and pain medicine while awaiting the results.  I will also give her a small normal saline bolus for kidney protection.  ----------------------------------------- 9:56 PM on 08/16/2014 -----------------------------------------  The patient's CT angio was negative for dissection and PE.  She has been resting relatively comfortably in her room although she still endorses some chest pain.  Her lab work was all unremarkable.  Both her chest x-ray report and CTA reports mentioned bibasilar atelectasis.  I discussed the finding with the radiologist by phone who had interpreted the CT scan.  Given her lack of systemic symptoms and normal vital signs, I strongly doubt this represents pneumonia.  Given her obesity she much more likely has chronic restrictive lung disease with poor inspirations that is leading to the symmetrical atelectasis seen in the scan  I discussed  the results of her tests with the patient.  She has a cardiologist at [facility] that has seen her for similar symptoms in the past.  I do not believe this represents pericarditis, but I will treat her with high-dose ibuprofen which should help as an anti-inflammatory and analgesic until she can follow-up with her cardiologist.  Given the lack of lower extremity swelling or pain, I will not order Dopplers of her lower extremities at this time but will advise close outpatient follow-up with her cardiologist.  I am also giving the patient an incentive spirometer to help with her atelectasis and having appropriate inspiratory force.  I gave her my usual and customary return precautions and she understands and agrees with the plan.  ____________________________________________   FINAL CLINICAL IMPRESSION(S) / ED DIAGNOSES  Final diagnoses:  Pleuritic chest pain  Shortness of breath  Bilateral atelectasis     Hinda Kehr, MD 08/16/14 2200

## 2014-08-16 NOTE — ED Notes (Signed)

## 2014-11-03 ENCOUNTER — Encounter: Payer: Self-pay | Admitting: *Deleted

## 2014-11-03 ENCOUNTER — Other Ambulatory Visit: Payer: Self-pay

## 2014-11-03 ENCOUNTER — Emergency Department
Admission: EM | Admit: 2014-11-03 | Discharge: 2014-11-03 | Disposition: A | Discharge status: Home / Self Care | Payer: PPO | Attending: Emergency Medicine | Admitting: Emergency Medicine

## 2014-11-03 ENCOUNTER — Emergency Department: Payer: PPO

## 2014-11-03 DIAGNOSIS — J441 Chronic obstructive pulmonary disease with (acute) exacerbation: Secondary | ICD-10-CM | POA: Diagnosis not present

## 2014-11-03 DIAGNOSIS — Z87891 Personal history of nicotine dependence: Secondary | ICD-10-CM | POA: Diagnosis not present

## 2014-11-03 DIAGNOSIS — Z79899 Other long term (current) drug therapy: Secondary | ICD-10-CM | POA: Insufficient documentation

## 2014-11-03 DIAGNOSIS — E119 Type 2 diabetes mellitus without complications: Secondary | ICD-10-CM | POA: Diagnosis not present

## 2014-11-03 DIAGNOSIS — Z7982 Long term (current) use of aspirin: Secondary | ICD-10-CM | POA: Insufficient documentation

## 2014-11-03 DIAGNOSIS — I10 Essential (primary) hypertension: Secondary | ICD-10-CM | POA: Insufficient documentation

## 2014-11-03 DIAGNOSIS — R0602 Shortness of breath: Secondary | ICD-10-CM | POA: Diagnosis present

## 2014-11-03 LAB — CBC
HCT: 39.3 % (ref 35.0–47.0)
HEMOGLOBIN: 13 g/dL (ref 12.0–16.0)
MCH: 28.3 pg (ref 26.0–34.0)
MCHC: 33.1 g/dL (ref 32.0–36.0)
MCV: 85.4 fL (ref 80.0–100.0)
Platelets: 278 10*3/uL (ref 150–440)
RBC: 4.6 MIL/uL (ref 3.80–5.20)
RDW: 17.7 % — ABNORMAL HIGH (ref 11.5–14.5)
WBC: 7.4 10*3/uL (ref 3.6–11.0)

## 2014-11-03 LAB — BASIC METABOLIC PANEL
Anion gap: 12 (ref 5–15)
BUN: 14 mg/dL (ref 6–20)
CHLORIDE: 101 mmol/L (ref 101–111)
CO2: 26 mmol/L (ref 22–32)
Calcium: 9.7 mg/dL (ref 8.9–10.3)
Creatinine, Ser: 0.87 mg/dL (ref 0.44–1.00)
GFR calc non Af Amer: >60 mL/min (ref >60)
GLUCOSE: 140 mg/dL — AB (ref 65–99)
Potassium: 3.8 mmol/L (ref 3.5–5.1)
SODIUM: 139 mmol/L (ref 135–145)

## 2014-11-03 LAB — TROPONIN I: Troponin I: <0.03 ng/mL (ref <0.031)

## 2014-11-03 MED ORDER — IPRATROPIUM-ALBUTEROL 0.5-2.5 (3) MG/3ML IN SOLN
3.0000 mL | RESPIRATORY_TRACT | Status: AC
  Administered 2014-11-03 (×3): 3 mL via RESPIRATORY_TRACT
  Filled 2014-11-03 (×2): qty 3

## 2014-11-03 MED ORDER — METHYLPREDNISOLONE SODIUM SUCC 125 MG IJ SOLR
125.0000 mg | Freq: Once | INTRAMUSCULAR | Status: AC
  Administered 2014-11-03: 125 mg via INTRAVENOUS
  Filled 2014-11-03: qty 2

## 2014-11-03 MED ORDER — ALBUTEROL SULFATE (2.5 MG/3ML) 0.083% IN NEBU
5.0000 mg | INHALATION_SOLUTION | Freq: Once | RESPIRATORY_TRACT | Status: AC
  Administered 2014-11-03: 5 mg via RESPIRATORY_TRACT
  Filled 2014-11-03: qty 6

## 2014-11-03 MED ORDER — PREDNISONE 20 MG PO TABS: 40.0000 mg | ORAL_TABLET | Freq: Every day | ORAL | Status: DC

## 2014-11-03 MED ORDER — ALBUTEROL SULFATE HFA 108 (90 BASE) MCG/ACT IN AERS: 2.0000 | INHALATION_SPRAY | RESPIRATORY_TRACT | Status: DC | PRN

## 2014-11-03 NOTE — ED Notes (Signed)
MD at bedside. 

## 2014-11-03 NOTE — Discharge Instructions (Signed)

## 2014-11-03 NOTE — ED Provider Notes (Addendum)
[facility] Emergency Department Provider Note  ____________________________________________  Time seen: 9:35 PM  I have reviewed the triage vital signs and the nursing notes.   HISTORY  Chief Complaint Shortness of Breath    HPI Denise Davis is a 49 y.o. female who complains of shortness of breath for 2 days. No precipitating illness or other event. Denies chest pain or exertional symptoms. No fevers or chills nausea vomiting diarrhea. No abdominal pain.  Dyspnea is associated with new nonproductive cough.   Past Medical History  Diagnosis Date  . Diabetes mellitus   . Hypertension   . DVT of leg (deep venous thrombosis) 12/2007    left leg, given warfarin for 6 mos  . Morbid obesity   . Renal cell cancer 2009    S/p nephrectomy (left)  . Pericarditis     Diagnosed/treated at [facility]; per patient 4 episodes per cardiologist Dr. Edwin Dada persistant chest pain unclear if pericarditis verusus neuropsychogenic   . H/O hiatal hernia   . Anxiety   . Shortness of breath     Patient Active Problem List   Diagnosis Date Noted  . Chronic respiratory failure 08/28/2013  . Syncope 08/25/2013  . Right sided weakness 08/25/2013  . Anxiety state, unspecified 08/19/2012  . Vocal cord dysfunction 08/18/2012  . Wheezing 08/16/2012  . Shortness of breath 08/16/2012  . Acute respiratory distress 08/16/2012  . Chest pain 05/09/2011  . Diabetes mellitus   . Hypertension     Past Surgical History  Procedure Laterality Date  . Endometrial ablation  2007  . Tubal ligation    . Insertion of mesh  2014  . Umbilical hernia repair    . Left heart catheterization with coronary angiogram N/A 08/30/2013    Procedure: LEFT HEART CATHETERIZATION WITH CORONARY ANGIOGRAM;  Surgeon: Troy Sine, MD;  Location: [facility] CATH LAB;  Service: Cardiovascular;  Laterality: N/A;    Current Outpatient Rx  Name  Route  Sig  Dispense  Refill  . albuterol (PROVENTIL HFA) 108 (90  BASE) MCG/ACT inhaler   Inhalation   Inhale 2 puffs into the lungs every 4 (four) hours as needed for wheezing or shortness of breath.   1 Inhaler   0   . amitriptyline (ELAVIL) 50 MG tablet   Oral   Take 150-200 mg by mouth every evening. Take 2-3 hours prior to bedtime         . aspirin EC 81 MG tablet   Oral   Take 81 mg by mouth daily.         . carvedilol (COREG) 12.5 MG tablet   Oral   Take 12.5 mg by mouth 2 (two) times daily with a meal.         . diazepam (VALIUM) 10 MG tablet   Oral   Take 10 mg by mouth 3 (three) times daily as needed for anxiety (anxiety).          Marland Kitchen esomeprazole (NEXIUM) 40 MG capsule   Oral   Take 40 mg by mouth daily.          Marland Kitchen exenatide (BYETTA) 5 MCG/0.02ML SOPN injection   Subcutaneous   Inject 5 mcg into the skin 2 (two) times daily with a meal.         . gabapentin (NEURONTIN) 300 MG capsule   Oral   Take 1,800 mg by mouth at bedtime.         . gabapentin (NEURONTIN) 600 MG tablet  Oral   Take 1,200 mg by mouth daily.          Marland Kitchen glipiZIDE (GLUCOTROL) 5 MG tablet   Oral   Take 5 mg by mouth 2 (two) times daily before a meal.          . hydrochlorothiazide (HYDRODIURIL) 25 MG tablet   Oral   Take 25 mg by mouth daily.         Marland Kitchen HYDROcodone-acetaminophen (NORCO/VICODIN) 5-325 MG per tablet   Oral   Take 1 tablet by mouth every 6 (six) hours as needed for moderate pain (pain in body).         Marland Kitchen ibuprofen (ADVIL,MOTRIN) 600 MG tablet      Take 1 tablet by mouth three times daily with meals   15 tablet   0   . lisinopril (PRINIVIL,ZESTRIL) 20 MG tablet   Oral   Take 20 mg by mouth daily.         Marland Kitchen loratadine (CLARITIN) 10 MG tablet   Oral   Take 10 mg by mouth at bedtime.         . metFORMIN (GLUCOPHAGE) 1000 MG tablet   Oral   Take 1,000 mg by mouth 2 (two) times daily with a meal.         . nitroGLYCERIN (NITROSTAT) 0.4 MG SL tablet   Sublingual   Place 0.4 mg under the tongue every 5  (five) minutes as needed for chest pain. (may repeat every 5 minutes but seek medical help if pain persists after 3 tablets)         . predniSONE (DELTASONE) 20 MG tablet   Oral   Take 2 tablets (40 mg total) by mouth daily.   8 tablet   0   . promethazine (PHENERGAN) 25 MG tablet   Oral   Take 50 mg by mouth 4 (four) times daily as needed for nausea or vomiting (Nausea).          . sertraline (ZOLOFT) 100 MG tablet   Oral   Take 200 mg by mouth at bedtime.          . triamcinolone (NASACORT AQ) 55 MCG/ACT AERO nasal inhaler   Each Nare   Place 2 sprays into both nostrils daily.            Allergies Lisinopril and Milk-related compounds  Family History  Problem Relation Age of Onset  . Hypertension Mother   . Breast cancer Mother   . Heart disease Paternal Grandmother     Social History History  Substance Use Topics  . Smoking status: Former Research scientist (life sciences)  . Smokeless tobacco: Never Used  . Alcohol Use: No    Review of Systems  Constitutional: No fever or chills. No weight changes Eyes:No blurry vision or double vision.  [name]: No sore throat. Cardiovascular: No chest pain. Respiratory: Shortness of breath and nonproductive cough Gastrointestinal: Negative for abdominal pain, vomiting and diarrhea.  No BRBPR or melena. Genitourinary: Negative for dysuria, urinary retention, bloody urine, or difficulty urinating. Musculoskeletal: Negative for back pain. No joint swelling or pain. Skin: Negative for rash. Neurological: Negative for headaches, focal weakness or numbness. Psychiatric:No anxiety or depression.   Endocrine:No hot/cold intolerance, changes in energy, or sleep difficulty.  10-point ROS otherwise negative.  ____________________________________________   PHYSICAL EXAM:  VITAL SIGNS: ED Triage Vitals  Enc Vitals Group     BP --      Pulse --      Resp 11/03/14 1951 20  Temp 11/03/14 1951 99.3 F (37.4 C)     Temp Source 11/03/14 1951  Oral     SpO2 11/03/14 1951 97 %     Weight 11/03/14 1951 364 lb (165.109 kg)     Height 11/03/14 1951 5\' 5"  (1.651 m)     Head Cir --      Peak Flow --      Pain Score 11/03/14 1952 7     Pain Loc --      Pain Edu? --      Excl. in [place]? --      Constitutional: Alert and oriented. Well appearing and in no distress. Eyes: No scleral icterus. No conjunctival pallor. PERRL. EOMI [name]   Head: Normocephalic and atraumatic.   Nose: No congestion/rhinnorhea. No septal hematoma   Mouth/Throat: MMM, no pharyngeal erythema. No peritonsillar mass. No uvula shift.   Neck: No stridor. No SubQ emphysema. No meningismus. Hematological/Lymphatic/Immunilogical: No cervical lymphadenopathy. Cardiovascular: RRR. Normal and symmetric distal pulses are present in all extremities. No murmurs, rubs, or gallops. Respiratory: Prolonged expiratory phase with diffuse wheezing, more pronounced with forced expiration. No focal rhonchi or rales. Breath sounds present in all lung fields.. Gastrointestinal: Soft and nontender. No distention. There is no CVA tenderness.  No rebound, rigidity, or guarding. Genitourinary: deferred Musculoskeletal: Nontender with normal range of motion in all extremities. No joint effusions.  No lower extremity tenderness.  No edema. Neurologic:   Normal speech and language.  CN 2-10 normal. Motor grossly intact.  Normal gait. No gross focal neurologic deficits are appreciated.  Skin:  Skin is warm, dry and intact. No rash noted.  No petechiae, purpura, or bullae. Psychiatric: Mood and affect are normal. Speech and behavior are normal. Patient exhibits appropriate insight and judgment.  ____________________________________________    LABS (pertinent positives/negatives) (all labs ordered are listed, but only abnormal results are displayed) Labs Reviewed  BASIC METABOLIC PANEL - Abnormal; Notable for the following:    Glucose, Bld 140 (*)    All other components  within normal limits  CBC - Abnormal; Notable for the following:    RDW 17.7 (*)    All other components within normal limits  TROPONIN I   ____________________________________________   EKG Initial EKG at [place] interpreted by me Normal sinus rhythm rate of 99, normal axis intervals, poor R-wave progression in anterior precordial leads, normal ST segments, inverted T waves in V2 through V3. This is unchanged compared to previous EKG on 08/16/2014.  Repeat EKG at 2204 interpreted by me Normal sinus rhythm rate of 85, normal axis and intervals, poor R progression in anterior precordial leads, normal ST segments, T-wave inversion in V2 and V3, unchanged from previous.   ____________________________________________    RADIOLOGY  Chest x-ray unremarkable  ____________________________________________   PROCEDURES  ____________________________________________   INITIAL IMPRESSION / ASSESSMENT AND PLAN / ED COURSE  Pertinent labs & imaging results that were available during my care of the patient were reviewed by me and considered in my medical decision making (see chart for details).  Labs unremarkable. Patient presents with reactive airway disease or COPD. Low suspicion for cardiac wheeze due to worsened heart failure. She is not having any PND orthopnea or exertional dyspnea. Peripheral edema appears to be at baseline. We'll give the patient steroids and DuoNeb's and plan to discharge home if improved.  ----------------------------------------- 10:24 PM on 11/03/2014 -----------------------------------------  Feeling much better. We'll discharge home on a short course of prednisone as well as with an albuterol inhaler.  We'll have her follow up with primary care in 5 days. Medically stable good condition no respiratory distress. No suspicion for ACS PE TAD pneumothorax carditis mediastinitis pneumonia or sepsis. Low suspicion for acidosis or hypercapnia. She was unable to control  his symptoms with her home albuterol inhaler because she ran out. We will refill this for her.  ____________________________________________   FINAL CLINICAL IMPRESSION(S) / ED DIAGNOSES  Final diagnoses:  COPD with acute exacerbation      Carrie Mew, MD 11/03/14 [place], MD 11/03/14 2227

## 2014-11-03 NOTE — ED Notes (Signed)
Patient with no complaints at this time. Respirations even and unlabored. Skin warm/dry. Discharge instructions reviewed with patient at this time. Patient given opportunity to voice concerns/ask questions. IV removed per policy and band-aid applied to site. Patient discharged at this time and left Emergency Department, via wheelchair.   

## 2014-11-03 NOTE — ED Notes (Signed)
Pt to triage via wheelchair.  Pt has sob for 2 days with chest tightness. Pt wheezing in triage.  Pt used nebulizer at home this morning without relief.

## 2014-11-03 NOTE — ED Notes (Signed)
Pt in [place].  Breathing treatment given.  Pt also had xray.  Iv in place.

## 2014-12-29 ENCOUNTER — Emergency Department
Admission: EM | Admit: 2014-12-29 | Discharge: 2014-12-29 | Disposition: A | Discharge status: Home / Self Care | Payer: PPO | Attending: Emergency Medicine | Admitting: Emergency Medicine

## 2014-12-29 ENCOUNTER — Emergency Department: Payer: PPO

## 2014-12-29 DIAGNOSIS — Z87891 Personal history of nicotine dependence: Secondary | ICD-10-CM | POA: Diagnosis not present

## 2014-12-29 DIAGNOSIS — M542 Cervicalgia: Secondary | ICD-10-CM | POA: Diagnosis present

## 2014-12-29 DIAGNOSIS — Z791 Long term (current) use of non-steroidal anti-inflammatories (NSAID): Secondary | ICD-10-CM | POA: Diagnosis not present

## 2014-12-29 DIAGNOSIS — Z79899 Other long term (current) drug therapy: Secondary | ICD-10-CM | POA: Diagnosis not present

## 2014-12-29 DIAGNOSIS — Z7951 Long term (current) use of inhaled steroids: Secondary | ICD-10-CM | POA: Diagnosis not present

## 2014-12-29 DIAGNOSIS — I10 Essential (primary) hypertension: Secondary | ICD-10-CM | POA: Insufficient documentation

## 2014-12-29 DIAGNOSIS — Z7982 Long term (current) use of aspirin: Secondary | ICD-10-CM | POA: Insufficient documentation

## 2014-12-29 DIAGNOSIS — E119 Type 2 diabetes mellitus without complications: Secondary | ICD-10-CM | POA: Insufficient documentation

## 2014-12-29 DIAGNOSIS — R200 Anesthesia of skin: Secondary | ICD-10-CM | POA: Diagnosis not present

## 2014-12-29 DIAGNOSIS — M5412 Radiculopathy, cervical region: Secondary | ICD-10-CM | POA: Diagnosis not present

## 2014-12-29 DIAGNOSIS — Z7952 Long term (current) use of systemic steroids: Secondary | ICD-10-CM | POA: Diagnosis not present

## 2014-12-29 DIAGNOSIS — M7918 Myalgia, other site: Secondary | ICD-10-CM

## 2014-12-29 DIAGNOSIS — M25512 Pain in left shoulder: Secondary | ICD-10-CM | POA: Diagnosis not present

## 2014-12-29 LAB — CBC
HEMATOCRIT: 37.4 % (ref 35.0–47.0)
HEMOGLOBIN: 12.1 g/dL (ref 12.0–16.0)
MCH: 27.7 pg (ref 26.0–34.0)
MCHC: 32.4 g/dL (ref 32.0–36.0)
MCV: 85.5 fL (ref 80.0–100.0)
PLATELETS: 281 10*3/uL (ref 150–440)
RBC: 4.37 MIL/uL (ref 3.80–5.20)
RDW: 15.4 % — ABNORMAL HIGH (ref 11.5–14.5)
WBC: 5.2 10*3/uL (ref 3.6–11.0)

## 2014-12-29 LAB — URINALYSIS COMPLETE WITH MICROSCOPIC (ARMC ONLY)
Bacteria, UA: NONE SEEN
Bilirubin Urine: NEGATIVE
Glucose, UA: NEGATIVE mg/dL
Hgb urine dipstick: NEGATIVE
Ketones, ur: NEGATIVE mg/dL
Nitrite: NEGATIVE
Protein, ur: NEGATIVE mg/dL
Specific Gravity, Urine: 1.019 (ref 1.005–1.030)
pH: 6 (ref 5.0–8.0)

## 2014-12-29 LAB — BASIC METABOLIC PANEL
ANION GAP: 6 (ref 5–15)
BUN: 14 mg/dL (ref 6–20)
CHLORIDE: 101 mmol/L (ref 101–111)
CO2: 28 mmol/L (ref 22–32)
Calcium: 8.7 mg/dL — ABNORMAL LOW (ref 8.9–10.3)
Creatinine, Ser: 0.94 mg/dL (ref 0.44–1.00)
GFR calc non Af Amer: >60 mL/min (ref >60)
Glucose, Bld: 128 mg/dL — ABNORMAL HIGH (ref 65–99)
POTASSIUM: 4 mmol/L (ref 3.5–5.1)
Sodium: 135 mmol/L (ref 135–145)

## 2014-12-29 LAB — GLUCOSE, CAPILLARY: Glucose-Capillary: 120 mg/dL — ABNORMAL HIGH (ref 65–99)

## 2014-12-29 LAB — TROPONIN I: Troponin I: <0.03 ng/mL (ref <0.031)

## 2014-12-29 MED ORDER — OXYCODONE-ACETAMINOPHEN 5-325 MG PO TABS
1.0000 | ORAL_TABLET | Freq: Once | ORAL | Status: AC
  Administered 2014-12-29: 1 via ORAL

## 2014-12-29 MED ORDER — PREDNISONE 20 MG PO TABS: 40.0000 mg | ORAL_TABLET | Freq: Every day | ORAL | Status: DC

## 2014-12-29 MED ORDER — HYDROMORPHONE HCL 1 MG/ML IJ SOLN
1.0000 mg | Freq: Once | INTRAMUSCULAR | Status: AC
  Administered 2014-12-29: 1 mg via INTRAMUSCULAR
  Filled 2014-12-29: qty 1

## 2014-12-29 MED ORDER — HYDROCODONE-ACETAMINOPHEN 5-325 MG PO TABS: 1.0000 | ORAL_TABLET | ORAL | Status: DC | PRN

## 2014-12-29 MED ORDER — CYCLOBENZAPRINE HCL 10 MG PO TABS
5.0000 mg | ORAL_TABLET | Freq: Once | ORAL | Status: AC
  Administered 2014-12-29: 5 mg via ORAL
  Filled 2014-12-29: qty 2

## 2014-12-29 MED ORDER — OXYCODONE-ACETAMINOPHEN 5-325 MG PO TABS
ORAL_TABLET | ORAL | Status: AC
  Filled 2014-12-29: qty 1

## 2014-12-29 MED ORDER — ONDANSETRON 4 MG PO TBDP
ORAL_TABLET | ORAL | Status: AC
  Administered 2014-12-29: 4 mg via ORAL
  Filled 2014-12-29: qty 1

## 2014-12-29 MED ORDER — PREDNISONE 20 MG PO TABS
60.0000 mg | ORAL_TABLET | Freq: Once | ORAL | Status: AC
  Administered 2014-12-29: 60 mg via ORAL
  Filled 2014-12-29: qty 3

## 2014-12-29 MED ORDER — ONDANSETRON 4 MG PO TBDP
4.0000 mg | ORAL_TABLET | Freq: Once | ORAL | Status: AC
  Administered 2014-12-29: 4 mg via ORAL

## 2014-12-29 NOTE — ED Provider Notes (Addendum)
[facility] Emergency Department Provider Note  Time seen: 3:36 PM  I have reviewed the triage vital signs and the nursing notes.   HISTORY  Chief Complaint Numbness    HPI Denise Davis is a 49 y.o. female with a past medical history of diabetes, hypertension, DVT, obesity, anxiety, presents the emergency department with left-sided neck and arm pain. According to the patient for the past 5-6 days she has been experiencing pain in the left side of her neck radiating down to her left shoulder. Also states occasional numbness sensation in her arm but denies any numbness currently. Denies any chest pain, shortness of breath, nausea or diaphoresis. Patient does not recall an inciting event, but denies any traumatic event. Currently rates the pain as moderate 7-8/10, worse with any type of movement of the neck or left arm, or. Push on the neck or left shoulder.    Past Medical History  Diagnosis Date  . Diabetes mellitus   . Hypertension   . DVT of leg (deep venous thrombosis) 12/2007    left leg, given warfarin for 6 mos  . Morbid obesity   . Renal cell cancer 2009    S/p nephrectomy (left)  . Pericarditis     Diagnosed/treated at [facility]; per patient 4 episodes per cardiologist Dr. Edwin Dada persistant chest pain unclear if pericarditis verusus neuropsychogenic   . H/O hiatal hernia   . Anxiety   . Shortness of breath     Patient Active Problem List   Diagnosis Date Noted  . Chronic respiratory failure 08/28/2013  . Syncope 08/25/2013  . Right sided weakness 08/25/2013  . Anxiety state, unspecified 08/19/2012  . Vocal cord dysfunction 08/18/2012  . Wheezing 08/16/2012  . Shortness of breath 08/16/2012  . Acute respiratory distress 08/16/2012  . Chest pain 05/09/2011  . Diabetes mellitus   . Hypertension     Past Surgical History  Procedure Laterality Date  . Endometrial ablation  2007  . Tubal ligation    . Insertion of mesh  2014  . Umbilical  hernia repair    . Left heart catheterization with coronary angiogram N/A 08/30/2013    Procedure: LEFT HEART CATHETERIZATION WITH CORONARY ANGIOGRAM;  Surgeon: Troy Sine, MD;  Location: [facility] CATH LAB;  Service: Cardiovascular;  Laterality: N/A;    Current Outpatient Rx  Name  Route  Sig  Dispense  Refill  . albuterol (PROVENTIL HFA) 108 (90 BASE) MCG/ACT inhaler   Inhalation   Inhale 2 puffs into the lungs every 4 (four) hours as needed for wheezing or shortness of breath.   1 Inhaler   0   . amitriptyline (ELAVIL) 50 MG tablet   Oral   Take 150-200 mg by mouth every evening. Take 2-3 hours prior to bedtime         . aspirin EC 81 MG tablet   Oral   Take 81 mg by mouth daily.         . carvedilol (COREG) 12.5 MG tablet   Oral   Take 12.5 mg by mouth 2 (two) times daily with a meal.         . diazepam (VALIUM) 10 MG tablet   Oral   Take 10 mg by mouth 3 (three) times daily as needed for anxiety (anxiety).          Marland Kitchen esomeprazole (NEXIUM) 40 MG capsule   Oral   Take 40 mg by mouth daily.          Marland Kitchen  exenatide (BYETTA) 5 MCG/0.02ML SOPN injection   Subcutaneous   Inject 5 mcg into the skin 2 (two) times daily with a meal.         . gabapentin (NEURONTIN) 300 MG capsule   Oral   Take 1,800 mg by mouth at bedtime.         . gabapentin (NEURONTIN) 600 MG tablet   Oral   Take 1,200 mg by mouth daily.          Marland Kitchen glipiZIDE (GLUCOTROL) 5 MG tablet   Oral   Take 5 mg by mouth 2 (two) times daily before a meal.          . hydrochlorothiazide (HYDRODIURIL) 25 MG tablet   Oral   Take 25 mg by mouth daily.         Marland Kitchen HYDROcodone-acetaminophen (NORCO/VICODIN) 5-325 MG per tablet   Oral   Take 1 tablet by mouth every 6 (six) hours as needed for moderate pain (pain in body).         Marland Kitchen ibuprofen (ADVIL,MOTRIN) 600 MG tablet      Take 1 tablet by mouth three times daily with meals   15 tablet   0   . lisinopril (PRINIVIL,ZESTRIL) 20 MG tablet   Oral    Take 20 mg by mouth daily.         Marland Kitchen loratadine (CLARITIN) 10 MG tablet   Oral   Take 10 mg by mouth at bedtime.         . metFORMIN (GLUCOPHAGE) 1000 MG tablet   Oral   Take 1,000 mg by mouth 2 (two) times daily with a meal.         . nitroGLYCERIN (NITROSTAT) 0.4 MG SL tablet   Sublingual   Place 0.4 mg under the tongue every 5 (five) minutes as needed for chest pain. (may repeat every 5 minutes but seek medical help if pain persists after 3 tablets)         . predniSONE (DELTASONE) 20 MG tablet   Oral   Take 2 tablets (40 mg total) by mouth daily.   8 tablet   0   . promethazine (PHENERGAN) 25 MG tablet   Oral   Take 50 mg by mouth 4 (four) times daily as needed for nausea or vomiting (Nausea).          . sertraline (ZOLOFT) 100 MG tablet   Oral   Take 200 mg by mouth at bedtime.          . triamcinolone (NASACORT AQ) 55 MCG/ACT AERO nasal inhaler   Each Nare   Place 2 sprays into both nostrils daily.            Allergies Lisinopril and Milk-related compounds  Family History  Problem Relation Age of Onset  . Hypertension Mother   . Breast cancer Mother   . Heart disease Paternal Grandmother     Social History Social History  Substance Use Topics  . Smoking status: Former Research scientist (life sciences)  . Smokeless tobacco: Never Used  . Alcohol Use: No    Review of Systems Constitutional: Negative for fever. Cardiovascular: Negative for chest pain. Respiratory: Negative for shortness of breath. Gastrointestinal: Negative for abdominal pain Genitourinary: Negative for dysuria. Musculoskeletal: Negative for back pain. Positive for left neck pain. Positive for left shoulder pain. Neurological: Negative for headache 10-point ROS otherwise negative.  ____________________________________________   PHYSICAL EXAM:  VITAL SIGNS: ED Triage Vitals  Enc Vitals Group     BP  12/29/14 1313 131/97 mmHg     Pulse Rate 12/29/14 1313 90     Resp 12/29/14 1313 18      Temp 12/29/14 1313 98.5 F (36.9 C)     Temp Source 12/29/14 1313 Oral     SpO2 12/29/14 1313 98 %     Weight 12/29/14 1313 364 lb (165.109 kg)     Height 12/29/14 1313 5\' 5"  (1.651 m)     Head Cir --      Peak Flow --      Pain Score --      Pain Loc --      Pain Edu? --      Excl. in [place]? --     Constitutional: Alert and oriented. Well appearing and in no distress. Eyes: Normal exam [name]   Head: Normocephalic and atraumatic.   Mouth/Throat: Mucous membranes are moist. Cardiovascular: Normal rate, regular rhythm. No murmurs Respiratory: Normal respiratory effort without tachypnea nor retractions. Breath sounds are clear and equal bilaterally. No wheezes/rales/rhonchi. Gastrointestinal: Soft and nontender. No distention.  Obese Musculoskeletal: Moderate left paraspinal cervical tenderness. Tenderness down her trapezius, with mild tenderness of the left shoulder. Neurologic:  Normal speech and language. No gross focal neurologic deficits are appreciated. Speech is normal. Sensation is equal bilaterally per patient. Grip strength is equal bilaterally. No pronator drift. Skin:  Skin is warm, dry and intact.  Psychiatric: Mood and affect are normal. Speech and behavior are normal. Patient exhibits appropriate insight and judgment.  ____________________________________________    EKG  EKG reviewed and interpreted by myself shows normal sinus rhythm at 76 bpm, narrow QRS, normal axis, normal intervals, nonspecific ST changes present. No ST elevations.  ____________________________________________    RADIOLOGY  Chest x-ray within normal limits  ____________________________________________    INITIAL IMPRESSION / ASSESSMENT AND PLAN / ED COURSE  Pertinent labs & imaging results that were available during my care of the patient were reviewed by me and considered in my medical decision making (see chart for details).  Patient presents for left-sided neck pain radiating  down to her left shoulder. Has been ongoing for 1 week. Worse with movement. Reproducible on exam. Most consistent with musculoskeletal/radicular pain. Cardiac enzyme negative, labs within normal limits, chest x-ray and EKG show no acute change. We will discharge the patient home with a short course of pain medication as well as prednisone. Patient agreeable to plan and will follow up with her primary care physician. Discussed strict chest pain and weakness/numbness return precautions. Patient agreeable.  ____________________________________________   FINAL CLINICAL IMPRESSION(S) / ED DIAGNOSES  Musculoskeletal pain   Harvest Dark, MD 12/29/14 1540  Harvest Dark, MD 12/29/14 1540

## 2014-12-29 NOTE — ED Notes (Signed)
Patient comes in complaining of pain to left side that started last night and has worsened today and she started developing numbness to left shoulder.

## 2014-12-29 NOTE — Discharge Instructions (Signed)

## 2015-02-12 ENCOUNTER — Encounter: Payer: Self-pay | Admitting: Emergency Medicine

## 2015-02-12 ENCOUNTER — Observation Stay
Admission: EM | Admit: 2015-02-12 | Discharge: 2015-02-14 | Disposition: A | Discharge status: Home / Self Care | Payer: PPO | Attending: Internal Medicine | Admitting: Internal Medicine

## 2015-02-12 ENCOUNTER — Emergency Department: Payer: PPO

## 2015-02-12 DIAGNOSIS — J9620 Acute and chronic respiratory failure, unspecified whether with hypoxia or hypercapnia: Secondary | ICD-10-CM | POA: Diagnosis present

## 2015-02-12 DIAGNOSIS — Z7984 Long term (current) use of oral hypoglycemic drugs: Secondary | ICD-10-CM | POA: Insufficient documentation

## 2015-02-12 DIAGNOSIS — Z8553 Personal history of malignant neoplasm of renal pelvis: Secondary | ICD-10-CM | POA: Insufficient documentation

## 2015-02-12 DIAGNOSIS — I10 Essential (primary) hypertension: Secondary | ICD-10-CM | POA: Diagnosis present

## 2015-02-12 DIAGNOSIS — Z905 Acquired absence of kidney: Secondary | ICD-10-CM | POA: Diagnosis not present

## 2015-02-12 DIAGNOSIS — R062 Wheezing: Secondary | ICD-10-CM | POA: Insufficient documentation

## 2015-02-12 DIAGNOSIS — R0781 Pleurodynia: Secondary | ICD-10-CM | POA: Diagnosis not present

## 2015-02-12 DIAGNOSIS — Z86718 Personal history of other venous thrombosis and embolism: Secondary | ICD-10-CM | POA: Diagnosis not present

## 2015-02-12 DIAGNOSIS — F411 Generalized anxiety disorder: Secondary | ICD-10-CM | POA: Diagnosis not present

## 2015-02-12 DIAGNOSIS — E119 Type 2 diabetes mellitus without complications: Secondary | ICD-10-CM | POA: Diagnosis not present

## 2015-02-12 DIAGNOSIS — Z803 Family history of malignant neoplasm of breast: Secondary | ICD-10-CM | POA: Insufficient documentation

## 2015-02-12 DIAGNOSIS — F329 Major depressive disorder, single episode, unspecified: Secondary | ICD-10-CM | POA: Diagnosis present

## 2015-02-12 DIAGNOSIS — Z794 Long term (current) use of insulin: Secondary | ICD-10-CM | POA: Diagnosis not present

## 2015-02-12 DIAGNOSIS — Z87891 Personal history of nicotine dependence: Secondary | ICD-10-CM | POA: Diagnosis not present

## 2015-02-12 DIAGNOSIS — Z7982 Long term (current) use of aspirin: Secondary | ICD-10-CM | POA: Diagnosis not present

## 2015-02-12 DIAGNOSIS — I071 Rheumatic tricuspid insufficiency: Secondary | ICD-10-CM | POA: Insufficient documentation

## 2015-02-12 DIAGNOSIS — I5189 Other ill-defined heart diseases: Secondary | ICD-10-CM | POA: Diagnosis not present

## 2015-02-12 DIAGNOSIS — Z79899 Other long term (current) drug therapy: Secondary | ICD-10-CM | POA: Diagnosis not present

## 2015-02-12 DIAGNOSIS — Z7951 Long term (current) use of inhaled steroids: Secondary | ICD-10-CM | POA: Insufficient documentation

## 2015-02-12 DIAGNOSIS — R079 Chest pain, unspecified: Secondary | ICD-10-CM | POA: Diagnosis present

## 2015-02-12 DIAGNOSIS — R609 Edema, unspecified: Secondary | ICD-10-CM

## 2015-02-12 DIAGNOSIS — R918 Other nonspecific abnormal finding of lung field: Secondary | ICD-10-CM | POA: Insufficient documentation

## 2015-02-12 DIAGNOSIS — G40909 Epilepsy, unspecified, not intractable, without status epilepticus: Secondary | ICD-10-CM

## 2015-02-12 DIAGNOSIS — R0602 Shortness of breath: Secondary | ICD-10-CM | POA: Insufficient documentation

## 2015-02-12 DIAGNOSIS — R55 Syncope and collapse: Secondary | ICD-10-CM | POA: Insufficient documentation

## 2015-02-12 DIAGNOSIS — I371 Nonrheumatic pulmonary valve insufficiency: Secondary | ICD-10-CM | POA: Diagnosis not present

## 2015-02-12 DIAGNOSIS — F419 Anxiety disorder, unspecified: Secondary | ICD-10-CM | POA: Diagnosis present

## 2015-02-12 DIAGNOSIS — K449 Diaphragmatic hernia without obstruction or gangrene: Secondary | ICD-10-CM | POA: Diagnosis not present

## 2015-02-12 DIAGNOSIS — J383 Other diseases of vocal cords: Secondary | ICD-10-CM | POA: Diagnosis not present

## 2015-02-12 DIAGNOSIS — Z888 Allergy status to other drugs, medicaments and biological substances status: Secondary | ICD-10-CM | POA: Diagnosis not present

## 2015-02-12 DIAGNOSIS — Z8249 Family history of ischemic heart disease and other diseases of the circulatory system: Secondary | ICD-10-CM | POA: Diagnosis not present

## 2015-02-12 DIAGNOSIS — R531 Weakness: Secondary | ICD-10-CM | POA: Diagnosis not present

## 2015-02-12 DIAGNOSIS — Z6841 Body Mass Index (BMI) 40.0 and over, adult: Secondary | ICD-10-CM | POA: Diagnosis not present

## 2015-02-12 DIAGNOSIS — K219 Gastro-esophageal reflux disease without esophagitis: Secondary | ICD-10-CM | POA: Diagnosis present

## 2015-02-12 DIAGNOSIS — R600 Localized edema: Secondary | ICD-10-CM | POA: Diagnosis not present

## 2015-02-12 DIAGNOSIS — Z91011 Allergy to milk products: Secondary | ICD-10-CM | POA: Diagnosis not present

## 2015-02-12 HISTORY — DX: Epilepsy, unspecified, not intractable, without status epilepticus: G40.909

## 2015-02-12 LAB — CBC
HEMATOCRIT: 34.4 % — AB (ref 35.0–47.0)
HEMOGLOBIN: 11.4 g/dL — AB (ref 12.0–16.0)
MCH: 28.5 pg (ref 26.0–34.0)
MCHC: 33.2 g/dL (ref 32.0–36.0)
MCV: 85.9 fL (ref 80.0–100.0)
Platelets: 267 10*3/uL (ref 150–440)
RBC: 4 MIL/uL (ref 3.80–5.20)
RDW: 15.7 % — AB (ref 11.5–14.5)
WBC: 7.3 10*3/uL (ref 3.6–11.0)

## 2015-02-12 LAB — COMPREHENSIVE METABOLIC PANEL
ALK PHOS: 74 U/L (ref 38–126)
ALT: 22 U/L (ref 14–54)
ANION GAP: 7 (ref 5–15)
AST: 20 U/L (ref 15–41)
Albumin: 3.5 g/dL (ref 3.5–5.0)
BILIRUBIN TOTAL: 0.5 mg/dL (ref 0.3–1.2)
BUN: 13 mg/dL (ref 6–20)
CALCIUM: 8.5 mg/dL — AB (ref 8.9–10.3)
CO2: 27 mmol/L (ref 22–32)
CREATININE: 0.84 mg/dL (ref 0.44–1.00)
Chloride: 102 mmol/L (ref 101–111)
Glucose, Bld: 125 mg/dL — ABNORMAL HIGH (ref 65–99)
Potassium: 3.7 mmol/L (ref 3.5–5.1)
SODIUM: 136 mmol/L (ref 135–145)
TOTAL PROTEIN: 6.8 g/dL (ref 6.5–8.1)

## 2015-02-12 LAB — TROPONIN I: Troponin I: <0.03 ng/mL (ref <0.031)

## 2015-02-12 LAB — BRAIN NATRIURETIC PEPTIDE: B NATRIURETIC PEPTIDE 5: 73 pg/mL (ref 0.0–100.0)

## 2015-02-12 LAB — FIBRIN DERIVATIVES D-DIMER (ARMC ONLY): Fibrin derivatives D-dimer (ARMC): 477 (ref 0–499)

## 2015-02-12 MED ORDER — IPRATROPIUM-ALBUTEROL 0.5-2.5 (3) MG/3ML IN SOLN
3.0000 mL | Freq: Once | RESPIRATORY_TRACT | Status: AC
  Administered 2015-02-12: 3 mL via RESPIRATORY_TRACT
  Filled 2015-02-12: qty 3

## 2015-02-12 MED ORDER — ONDANSETRON HCL 4 MG/2ML IJ SOLN
8.0000 mg | Freq: Once | INTRAMUSCULAR | Status: AC
  Administered 2015-02-12: 8 mg via INTRAVENOUS

## 2015-02-12 MED ORDER — DIAZEPAM 5 MG/ML IJ SOLN
2.5000 mg | Freq: Once | INTRAMUSCULAR | Status: AC
  Administered 2015-02-12: 2.5 mg via INTRAVENOUS
  Filled 2015-02-12: qty 2

## 2015-02-12 MED ORDER — PREDNISONE 20 MG PO TABS: ORAL_TABLET | ORAL | Status: DC

## 2015-02-12 MED ORDER — ONDANSETRON HCL 4 MG/2ML IJ SOLN
INTRAMUSCULAR | Status: AC
  Administered 2015-02-12: 8 mg via INTRAVENOUS
  Filled 2015-02-12: qty 4

## 2015-02-12 MED ORDER — OXYCODONE-ACETAMINOPHEN 5-325 MG PO TABS
2.0000 | ORAL_TABLET | Freq: Once | ORAL | Status: DC
  Filled 2015-02-12: qty 2

## 2015-02-12 MED ORDER — METHYLPREDNISOLONE SODIUM SUCC 125 MG IJ SOLR
125.0000 mg | Freq: Once | INTRAMUSCULAR | Status: AC
  Administered 2015-02-12: 125 mg via INTRAVENOUS
  Filled 2015-02-12: qty 2

## 2015-02-12 NOTE — ED Notes (Signed)
Pt reports that percocet does not help her pain. MD made aware but states he will talk to pt before changing medication. Pt made aware and continues to refuse percocet.

## 2015-02-12 NOTE — ED Provider Notes (Signed)
[facility] Emergency Department Provider Note  ____________________________________________  Time seen: 2115  I have reviewed the triage vital signs and the nursing notes.   HISTORY  Chief Complaint Chest Pain     HPI Denise Davis is a 49 y.o. female with multiple health problems, including diabetes, hypertension, obesity, respiratory issues, who developed some chest pain last night. It awoke her in her sleep. This pain is in her upper central chest. She reports she's having more shortness of breath and feels a tightness in her chest. The patient does have a history of pericarditis approximate 2 years ago. She reports the symptoms are not like that episode. She confirms nausea, but has not had any vomiting and has no abdominal pain or diarrhea.  The discomfort waxes and wanes. It does not appear to be positional or exertional. It does hurt more when she takes a deeper breath. The patient does have a history of a DVT in 2011. She is not on blood thinners currently as she completed that treatment and did not have further problems. That DVT occurred following a surgery and hospitalization.    Past Medical History  Diagnosis Date  . Diabetes mellitus   . Hypertension   . DVT of leg (deep venous thrombosis) ([place]) 12/2007    left leg, given warfarin for 6 mos  . Morbid obesity ([place])   . Renal cell cancer ([place]) 2009    S/p nephrectomy (left)  . Pericarditis     Diagnosed/treated at [facility]; per patient 4 episodes per cardiologist Dr. Edwin Dada persistant chest pain unclear if pericarditis verusus neuropsychogenic   . H/O hiatal hernia   . Anxiety   . Shortness of breath     Patient Active Problem List   Diagnosis Date Noted  . Chronic respiratory failure ([place]) 08/28/2013  . Syncope 08/25/2013  . Right sided weakness 08/25/2013  . Anxiety state, unspecified 08/19/2012  . Vocal cord dysfunction 08/18/2012  . Wheezing 08/16/2012  . Shortness of breath  08/16/2012  . Acute respiratory distress (HCC) 08/16/2012  . Chest pain 05/09/2011  . Diabetes mellitus ([place])   . Hypertension     Past Surgical History  Procedure Laterality Date  . Endometrial ablation  2007  . Tubal ligation    . Insertion of mesh  2014  . Umbilical hernia repair    . Left heart catheterization with coronary angiogram N/A 08/30/2013    Procedure: LEFT HEART CATHETERIZATION WITH CORONARY ANGIOGRAM;  Surgeon: Troy Sine, MD;  Location: [facility] CATH LAB;  Service: Cardiovascular;  Laterality: N/A;    Current Outpatient Rx  Name  Route  Sig  Dispense  Refill  . albuterol (PROVENTIL HFA) 108 (90 BASE) MCG/ACT inhaler   Inhalation   Inhale 2 puffs into the lungs every 4 (four) hours as needed for wheezing or shortness of breath.   1 Inhaler   0   . aspirin EC 81 MG tablet   Oral   Take 81 mg by mouth daily.         . carvedilol (COREG) 12.5 MG tablet   Oral   Take 25 mg by mouth 2 (two) times daily with a meal.          . Cholecalciferol (VITAMIN D-3) 1000 UNITS CAPS   Oral   Take 1 capsule by mouth daily.         Marland Kitchen exenatide (BYETTA) 5 MCG/0.02ML SOPN injection   Subcutaneous   Inject 5 mcg into the skin 2 (two)  times daily with a meal.         . ferrous sulfate 325 (65 FE) MG tablet   Oral   Take 325 mg by mouth daily with breakfast.         . gabapentin (NEURONTIN) 300 MG capsule   Oral   Take 1,800 mg by mouth at bedtime.         . hydrochlorothiazide (HYDRODIURIL) 25 MG tablet   Oral   Take 25 mg by mouth daily.         Marland Kitchen HYDROcodone-acetaminophen (NORCO/VICODIN) 5-325 MG tablet   Oral   Take 1 tablet by mouth every 4 (four) hours as needed for moderate pain.   12 tablet   0   . ibuprofen (ADVIL,MOTRIN) 600 MG tablet      Take 1 tablet by mouth three times daily with meals   15 tablet   0   . levETIRAcetam (KEPPRA) 750 MG tablet   Oral   Take 1,500 mg by mouth 2 (two) times daily.         Marland Kitchen loratadine (CLARITIN) 10  MG tablet   Oral   Take 10 mg by mouth at bedtime.         . metFORMIN (GLUCOPHAGE) 1000 MG tablet   Oral   Take 1,000 mg by mouth 2 (two) times daily with a meal.         . metoCLOPramide (REGLAN) 10 MG tablet   Oral   Take 10 mg by mouth 3 (three) times daily before meals.         . nitroGLYCERIN (NITROSTAT) 0.4 MG SL tablet   Sublingual   Place 0.4 mg under the tongue every 5 (five) minutes as needed for chest pain. (may repeat every 5 minutes but seek medical help if pain persists after 3 tablets)         . omeprazole (PRILOSEC) 40 MG capsule   Oral   Take 40 mg by mouth daily.         . Pediatric Multivit-Minerals-C (MULTIVITAMIN GUMMIES CHILDRENS PO)   Oral   Take 1 tablet by mouth daily.         . QUEtiapine (SEROQUEL) 100 MG tablet   Oral   Take 100 mg by mouth at bedtime.         . sertraline (ZOLOFT) 100 MG tablet   Oral   Take 200 mg by mouth at bedtime.          . Thiamine HCl (VITAMIN B-1) 250 MG tablet   Oral   Take 250 mg by mouth daily.         . vitamin B-12 (CYANOCOBALAMIN) 500 MCG tablet   Oral   Take 500 mcg by mouth daily.         . vitamin E 400 UNIT capsule   Oral   Take 400 Units by mouth daily.         . predniSONE (DELTASONE) 20 MG tablet   Oral   Take 2 tablets (40 mg total) by mouth daily. Patient not taking: Reported on 02/12/2015   10 tablet   0   . predniSONE (DELTASONE) 20 MG tablet      Take 2 tablets on Monday and Tuesday and one tablet on Wednesday through Friday   7 tablet   0     Allergies Lisinopril and Milk-related compounds  Family History  Problem Relation Age of Onset  . Hypertension Mother   . Breast cancer Mother   .  Heart disease Paternal Grandmother     Social History Social History  Substance Use Topics  . Smoking status: Former Research scientist (life sciences)  . Smokeless tobacco: Never Used  . Alcohol Use: No    Review of Systems  Constitutional: Negative for fatigue. [name]: Negative for  congestion. Cardiovascular: Positive for chest pain. Respiratory: Short of breath and discomfort with inspiration. See history of present illness Gastrointestinal: Negative for abdominal pain, vomiting and diarrhea. Genitourinary: Negative for dysuria. Musculoskeletal: No myalgias or injuries. Skin: Negative for rash. Neurological: Negative for headache or focal weakness   10-point ROS otherwise negative.  ____________________________________________   PHYSICAL EXAM:  VITAL SIGNS: ED Triage Vitals  Enc Vitals Group     BP 02/12/15 2047 135/102 mmHg     Pulse Rate 02/12/15 2047 81     Resp 02/12/15 2047 18     Temp 02/12/15 2047 98.9 F (37.2 C)     Temp Source 02/12/15 2047 Oral     SpO2 02/12/15 2047 99 %     Weight 02/12/15 2047 365 lb (165.563 kg)     Height 02/12/15 2047 5\' 5"  (1.651 m)     Head Cir --      Peak Flow --      Pain Score 02/12/15 2048 10     Pain Loc --      Pain Edu? --      Excl. in [place]? --     Constitutional: Alert and oriented. Patient taking slightly shallow breaths with mild tachypnea. She appears a little bit uncomfortable.Marland Kitchen [name]   Head: Normocephalic and atraumatic.   Nose: No congestion/rhinnorhea.       Mouth: No erythema, no swelling   Cardiovascular: Normal rate, regular rhythm, no murmur noted Respiratory:  Shallow breaths with mild increase work of breathing and mild tachypnea..    Breath sounds are slightly distant, but there is no wheezing.  Gastrointestinal: Large, obese. Soft and nontender. No distention.  Back: No muscle spasm, no tenderness, no CVA tenderness. Musculoskeletal: No deformity noted. Nontender. Patient is notably obese, which limits exam some.  No noted edema. Neurologic:  Communicative. Normal appearing spontaneous movement in all 4 extremities. No gross focal neurologic deficits are appreciated.  Skin:  Skin is warm, dry. No rash noted. Psychiatric: Mood and affect are normal. Speech and behavior are normal.   ____________________________________________    LABS (pertinent positives/negatives)  Labs Reviewed  CBC - Abnormal; Notable for the following:    Hemoglobin 11.4 (*)    HCT 34.4 (*)    RDW 15.7 (*)    All other components within normal limits  COMPREHENSIVE METABOLIC PANEL - Abnormal; Notable for the following:    Glucose, Bld 125 (*)    Calcium 8.5 (*)    All other components within normal limits  TROPONIN I  FIBRIN DERIVATIVES D-DIMER ([facility] ONLY)  BRAIN NATRIURETIC PEPTIDE     ____________________________________________   EKG  ED ECG REPORT I, Sahirah Rudell W, the attending physician, personally viewed and interpreted this ECG.   Date: 02/12/2015  EKG Time: 2048  Rate: 79  Rhythm: Normal sinus rhythm  Axis: Normal  Intervals: Normal  ST&T Change: Inverted T-wave in V2 V3. Slightly flat in lead 3 and V6.   ____________________________________________    RADIOLOGY  Chest x-ray  FINDINGS: Lower lung volumes from prior exam leading to crowding of bronchovascular structures. Possible vascular congestion versus differences in technique. Limited basilar assessment due to soft tissue attenuation from body habitus. The cardiomediastinal contours are unchanged. No  evident focal consolidation, pleural effusion or pneumothorax.  IMPRESSION: Hypoventilatory chest. Vascular congestion versus secondary to technique and low lung volumes. ____________________________________________   PROCEDURES    ____________________________________________   INITIAL IMPRESSION / ASSESSMENT AND PLAN / ED COURSE  Pertinent labs & imaging results that were available during my care of the patient were reviewed by me and considered in my medical decision making (see chart for details).  49 year old female with upper chest pain, worse with inspiration. She does have a history of a DVT over 5 years ago. She is not currently under any treatment for coagulopathy. She is not on  any blood thinners. She is not tachycardic and she does not appear hypoxic. Still, with the complaint of discomfort with inspiration and a remote history of DVT, I have spoke with the patient about the possibility of a pulmonary emboli and the evaluation involved. We have spoke about getting a CT scan area due to the fact that she has only one kidney as well as the fact that she has had pericarditis in the past and they have been advised to avoid unnecessary radiation, the patient would like to avoid going straight to CT for possible pulmonary emboli. She would like to have [place] perform a D-dimer test burst. I discussed the risks and benefits of this evaluation with her and her husband.   She does seem to be tight, with shallow Breaths. We will treat her with albuterol nebulizer treatment. She did use her albuterol inhaler earlier this morning and again at 12:30, 2 puffs each time. This was possibly not an adequate dose.  ----------------------------------------- 10:23 PM on 02/12/2015 -----------------------------------------  The patient's chest x-ray shows lower lung volumes. This is not a surprise given her clinical appearance. Her D-dimer test is negative at 477. With this, we will avoid a CT scan. The patient understands the small risk present, but I think clinically this makes sense, as she is not tachycardic and she is not hypoxic and I think she hasn't different explanation for her shortness of breath.  At this point, the patient has had one nebulized treatment. She reports she doesn't feel much different. She is continuing to have some chest pain. We will treat her with a small dose of Valium with the hope that that will ease some of the discomfort and tension in her chest and she can take a more relaxed breath.  ----------------------------------------- 10:55 PM on 02/12/2015 -----------------------------------------  Patient reports she is breathing more comfortably now, but she has ongoing  discomfort in the upper chest. I discussed treatment options with the patient and we have agreed to do another breathing treatment and start steroids. I will also treat her pain with Percocet.  Given her ongoing chest pain, history of diabetes, hypertension, and pulmonary issues, I have offered the patient discharged home or admission to the hospital for observation and further evaluation. She is not comfortable going home. Given her multiple risk factors, observation is reasonable. We will repeat cardiac testing and treat her for what I suspect is a COPD exacerbation.  ____________________________________________   FINAL CLINICAL IMPRESSION(S) / ED DIAGNOSES  Final diagnoses:  Shortness of breath  Chest pain, unspecified chest pain type  Acute on chronic respiratory failure, unspecified whether with hypoxia or hypercapnia ([place])      Ahmed Prima, MD 02/12/15 2337

## 2015-02-12 NOTE — ED Notes (Signed)
Pt states that she started having left sided chest pain yesterday, states some sob, pt states hx of pericarditis 2 years ago

## 2015-02-13 ENCOUNTER — Observation Stay
Admit: 2015-02-13 | Discharge: 2015-02-13 | Disposition: A | Discharge status: Home / Self Care | Payer: PPO | Attending: Physician Assistant | Admitting: Physician Assistant

## 2015-02-13 ENCOUNTER — Observation Stay: Payer: PPO

## 2015-02-13 ENCOUNTER — Encounter: Payer: Self-pay | Admitting: Internal Medicine

## 2015-02-13 DIAGNOSIS — F419 Anxiety disorder, unspecified: Secondary | ICD-10-CM

## 2015-02-13 DIAGNOSIS — F32A Depression, unspecified: Secondary | ICD-10-CM | POA: Diagnosis present

## 2015-02-13 DIAGNOSIS — F329 Major depressive disorder, single episode, unspecified: Secondary | ICD-10-CM | POA: Diagnosis present

## 2015-02-13 DIAGNOSIS — G40909 Epilepsy, unspecified, not intractable, without status epilepticus: Secondary | ICD-10-CM

## 2015-02-13 DIAGNOSIS — K219 Gastro-esophageal reflux disease without esophagitis: Secondary | ICD-10-CM | POA: Diagnosis present

## 2015-02-13 LAB — HEMOGLOBIN A1C: Hgb A1c MFr Bld: 6.5 % — ABNORMAL HIGH (ref 4.0–6.0)

## 2015-02-13 LAB — CBC
HEMATOCRIT: 38.2 % (ref 35.0–47.0)
HEMOGLOBIN: 12.7 g/dL (ref 12.0–16.0)
MCH: 28.9 pg (ref 26.0–34.0)
MCHC: 33.2 g/dL (ref 32.0–36.0)
MCV: 87 fL (ref 80.0–100.0)
Platelets: 256 10*3/uL (ref 150–440)
RBC: 4.39 MIL/uL (ref 3.80–5.20)
RDW: 15.8 % — ABNORMAL HIGH (ref 11.5–14.5)
WBC: 9 10*3/uL (ref 3.6–11.0)

## 2015-02-13 LAB — BASIC METABOLIC PANEL
ANION GAP: 7 (ref 5–15)
BUN: 12 mg/dL (ref 6–20)
CHLORIDE: 101 mmol/L (ref 101–111)
CO2: 27 mmol/L (ref 22–32)
Calcium: 8.8 mg/dL — ABNORMAL LOW (ref 8.9–10.3)
Creatinine, Ser: 0.82 mg/dL (ref 0.44–1.00)
GFR calc Af Amer: >60 mL/min (ref >60)
GFR calc non Af Amer: >60 mL/min (ref >60)
Glucose, Bld: 182 mg/dL — ABNORMAL HIGH (ref 65–99)
POTASSIUM: 4 mmol/L (ref 3.5–5.1)
SODIUM: 135 mmol/L (ref 135–145)

## 2015-02-13 LAB — TROPONIN I
Troponin I: <0.03 ng/mL (ref <0.031)
Troponin I: <0.03 ng/mL (ref <0.031)

## 2015-02-13 LAB — MRSA PCR SCREENING: MRSA by PCR: NEGATIVE

## 2015-02-13 LAB — GLUCOSE, CAPILLARY
GLUCOSE-CAPILLARY: 184 mg/dL — AB (ref 65–99)
GLUCOSE-CAPILLARY: 163 mg/dL — AB (ref 65–99)
Glucose-Capillary: 174 mg/dL — ABNORMAL HIGH (ref 65–99)

## 2015-02-13 LAB — BRAIN NATRIURETIC PEPTIDE: B Natriuretic Peptide: 125 pg/mL — ABNORMAL HIGH (ref 0.0–100.0)

## 2015-02-13 MED ORDER — PANTOPRAZOLE SODIUM 40 MG PO TBEC
40.0000 mg | DELAYED_RELEASE_TABLET | Freq: Every day | ORAL | Status: DC
  Administered 2015-02-13 – 2015-02-14 (×2): 40 mg via ORAL
  Filled 2015-02-13 (×2): qty 1

## 2015-02-13 MED ORDER — QUETIAPINE FUMARATE 100 MG PO TABS
100.0000 mg | ORAL_TABLET | Freq: Every day | ORAL | Status: DC
  Administered 2015-02-13: 100 mg via ORAL
  Filled 2015-02-13: qty 1

## 2015-02-13 MED ORDER — ALBUTEROL SULFATE (2.5 MG/3ML) 0.083% IN NEBU: 2.0000 mL | INHALATION_SOLUTION | RESPIRATORY_TRACT | Status: DC | PRN

## 2015-02-13 MED ORDER — SERTRALINE HCL 50 MG PO TABS
200.0000 mg | ORAL_TABLET | Freq: Every day | ORAL | Status: DC
  Administered 2015-02-13: 200 mg via ORAL
  Filled 2015-02-13: qty 4

## 2015-02-13 MED ORDER — HYDRALAZINE HCL 20 MG/ML IJ SOLN: 10.0000 mg | INTRAMUSCULAR | Status: DC | PRN

## 2015-02-13 MED ORDER — HYDROCODONE-ACETAMINOPHEN 5-325 MG PO TABS
1.0000 | ORAL_TABLET | ORAL | Status: DC | PRN
  Administered 2015-02-13 – 2015-02-14 (×4): 1 via ORAL
  Filled 2015-02-13 (×4): qty 1

## 2015-02-13 MED ORDER — CARVEDILOL 12.5 MG PO TABS
25.0000 mg | ORAL_TABLET | Freq: Two times a day (BID) | ORAL | Status: DC
  Administered 2015-02-13 – 2015-02-14 (×3): 25 mg via ORAL
  Filled 2015-02-13 (×4): qty 2

## 2015-02-13 MED ORDER — ONDANSETRON HCL 4 MG PO TABS: 4.0000 mg | ORAL_TABLET | Freq: Four times a day (QID) | ORAL | Status: DC | PRN

## 2015-02-13 MED ORDER — ACETAMINOPHEN 650 MG RE SUPP: 650.0000 mg | Freq: Four times a day (QID) | RECTAL | Status: DC | PRN

## 2015-02-13 MED ORDER — ONDANSETRON HCL 4 MG/2ML IJ SOLN
4.0000 mg | Freq: Four times a day (QID) | INTRAMUSCULAR | Status: DC | PRN
  Filled 2015-02-13: qty 2

## 2015-02-13 MED ORDER — ACETAMINOPHEN 325 MG PO TABS: 650.0000 mg | ORAL_TABLET | Freq: Four times a day (QID) | ORAL | Status: DC | PRN

## 2015-02-13 MED ORDER — MORPHINE SULFATE (PF) 2 MG/ML IV SOLN
2.0000 mg | INTRAVENOUS | Status: DC | PRN
  Administered 2015-02-13 – 2015-02-14 (×4): 2 mg via INTRAVENOUS
  Filled 2015-02-13 (×5): qty 1

## 2015-02-13 MED ORDER — LEVETIRACETAM 500 MG PO TABS
1500.0000 mg | ORAL_TABLET | Freq: Two times a day (BID) | ORAL | Status: DC
  Administered 2015-02-13 – 2015-02-14 (×4): 1500 mg via ORAL
  Filled 2015-02-13 (×3): qty 3

## 2015-02-13 MED ORDER — AMLODIPINE BESYLATE 10 MG PO TABS
10.0000 mg | ORAL_TABLET | Freq: Every day | ORAL | Status: DC
  Administered 2015-02-13 – 2015-02-14 (×2): 10 mg via ORAL
  Filled 2015-02-13 (×2): qty 1

## 2015-02-13 MED ORDER — ENOXAPARIN SODIUM 40 MG/0.4ML ~~LOC~~ SOLN
40.0000 mg | Freq: Two times a day (BID) | SUBCUTANEOUS | Status: DC
  Administered 2015-02-13 – 2015-02-14 (×3): 40 mg via SUBCUTANEOUS
  Filled 2015-02-13 (×3): qty 0.4

## 2015-02-13 MED ORDER — INDOMETHACIN ER 75 MG PO CPCR
75.0000 mg | ORAL_CAPSULE | Freq: Two times a day (BID) | ORAL | Status: DC
  Administered 2015-02-13 – 2015-02-14 (×2): 75 mg via ORAL
  Filled 2015-02-13 (×4): qty 1

## 2015-02-13 MED ORDER — INSULIN ASPART 100 UNIT/ML ~~LOC~~ SOLN: 0.0000 [IU] | Freq: Four times a day (QID) | SUBCUTANEOUS | Status: DC | PRN

## 2015-02-13 MED ORDER — SODIUM CHLORIDE 0.9 % IJ SOLN
3.0000 mL | Freq: Two times a day (BID) | INTRAMUSCULAR | Status: DC
  Administered 2015-02-13 – 2015-02-14 (×4): 3 mL via INTRAVENOUS

## 2015-02-13 MED ORDER — ASPIRIN EC 81 MG PO TBEC
81.0000 mg | DELAYED_RELEASE_TABLET | Freq: Every day | ORAL | Status: DC
  Administered 2015-02-13 – 2015-02-14 (×2): 81 mg via ORAL
  Filled 2015-02-13 (×2): qty 1

## 2015-02-13 NOTE — H&P (Signed)
[facility] NAME: Denise Davis    MR#:  [id number]  DATE OF BIRTH:  Nov 20, 1965  DATE OF ADMISSION:  02/12/2015  PRIMARY CARE PHYSICIAN: Marguerita Merles, MD   REQUESTING/REFERRING PHYSICIAN: Thomasene Lot, M.D.  CHIEF COMPLAINT:   Chief Complaint  Patient presents with  . Chest Pain    HISTORY OF PRESENT ILLNESS:  Denise Davis  is a 49 y.o. female who presents with chest pain. Patient states that her chest pain is anterior central in location. It is an aching type pain. It is nonradiating, not associated with any nausea, vomiting, diaphoresis, headache, visual changes. She states that the chest pain first started 24 hours prior to presentation in the ED, and woke her from her sleep. It was associated with shortness of breath. She took her normal dose of home pain medications which did not alleviate the pain. He states that the pain is worse with exertion. In the ED her workup was largely negative, including negative cardiac enzymes 1, normal d-dimer, and labs otherwise largely normal. Her chest x-ray did show some vascular congestion, which per radiology is potentially related to her obesity and technique or low lung volumes. However, her chest pain persisted and given the patient's risk factors, hospitalists were called for admission for serial enzymes and evaluation of the same.  PAST MEDICAL HISTORY:   Past Medical History  Diagnosis Date  . Diabetes mellitus   . Hypertension   . DVT of leg (deep venous thrombosis) ([place]) 12/2007    left leg, given warfarin for 6 mos  . Morbid obesity ([place])   . Renal cell cancer ([place]) 2009    S/p nephrectomy (left)  . Pericarditis     Diagnosed/treated at [facility]; per patient 4 episodes per cardiologist Dr. Edwin Dada persistant chest pain unclear if pericarditis verusus neuropsychogenic   . H/O hiatal hernia   . Anxiety   . Shortness of breath   . Seizure disorder ([place])     PAST SURGICAL  HISTORY:   Past Surgical History  Procedure Laterality Date  . Endometrial ablation  2007  . Tubal ligation    . Insertion of mesh  2014  . Umbilical hernia repair    . Left heart catheterization with coronary angiogram N/A 08/30/2013    Procedure: LEFT HEART CATHETERIZATION WITH CORONARY ANGIOGRAM;  Surgeon: Troy Sine, MD;  Location: [facility] CATH LAB;  Service: Cardiovascular;  Laterality: N/A;    SOCIAL HISTORY:   Social History  Substance Use Topics  . Smoking status: Former Research scientist (life sciences)  . Smokeless tobacco: Never Used  . Alcohol Use: No    FAMILY HISTORY:   Family History  Problem Relation Age of Onset  . Hypertension Mother   . Breast cancer Mother   . Heart disease Paternal Grandmother     DRUG ALLERGIES:   Allergies  Allergen Reactions  . Lisinopril Cough  . Milk-Related Compounds     Diarrhea and vomiting  "only milk causes problems. Milk products are ok"    MEDICATIONS AT HOME:   Prior to Admission medications   Medication Sig Start Date End Date Taking? Authorizing Provider  albuterol (PROVENTIL HFA) 108 (90 BASE) MCG/ACT inhaler Inhale 2 puffs into the lungs every 4 (four) hours as needed for wheezing or shortness of breath. 11/03/14  Yes Carrie Mew, MD  aspirin EC 81 MG tablet Take 81 mg by mouth daily.   Yes Historical Provider, MD  carvedilol (COREG) 12.5 MG tablet Take 25  mg by mouth 2 (two) times daily with a meal.    Yes Historical Provider, MD  Cholecalciferol (VITAMIN D-3) 1000 UNITS CAPS Take 1 capsule by mouth daily.   Yes Historical Provider, MD  exenatide (BYETTA) 5 MCG/0.02ML SOPN injection Inject 5 mcg into the skin 2 (two) times daily with a meal.   Yes Historical Provider, MD  ferrous sulfate 325 (65 FE) MG tablet Take 325 mg by mouth daily with breakfast.   Yes Historical Provider, MD  gabapentin (NEURONTIN) 300 MG capsule Take 1,800 mg by mouth at bedtime.   Yes Historical Provider, MD  hydrochlorothiazide (HYDRODIURIL) 25 MG tablet Take 25  mg by mouth daily.   Yes Historical Provider, MD  HYDROcodone-acetaminophen (NORCO/VICODIN) 5-325 MG tablet Take 1 tablet by mouth every 4 (four) hours as needed for moderate pain. 12/29/14  Yes Harvest Dark, MD  ibuprofen (ADVIL,MOTRIN) 600 MG tablet Take 1 tablet by mouth three times daily with meals 08/16/14  Yes Hinda Kehr, MD  levETIRAcetam (KEPPRA) 750 MG tablet Take 1,500 mg by mouth 2 (two) times daily.   Yes Historical Provider, MD  loratadine (CLARITIN) 10 MG tablet Take 10 mg by mouth at bedtime.   Yes Historical Provider, MD  metFORMIN (GLUCOPHAGE) 1000 MG tablet Take 1,000 mg by mouth 2 (two) times daily with a meal.   Yes Historical Provider, MD  metoCLOPramide (REGLAN) 10 MG tablet Take 10 mg by mouth 3 (three) times daily before meals.   Yes Historical Provider, MD  nitroGLYCERIN (NITROSTAT) 0.4 MG SL tablet Place 0.4 mg under the tongue every 5 (five) minutes as needed for chest pain. (may repeat every 5 minutes but seek medical help if pain persists after 3 tablets) 07/06/09  Yes Historical Provider, MD  omeprazole (PRILOSEC) 40 MG capsule Take 40 mg by mouth daily.   Yes Historical Provider, MD  Pediatric Multivit-Minerals-C (MULTIVITAMIN GUMMIES CHILDRENS PO) Take 1 tablet by mouth daily.   Yes Historical Provider, MD  QUEtiapine (SEROQUEL) 100 MG tablet Take 100 mg by mouth at bedtime.   Yes Historical Provider, MD  sertraline (ZOLOFT) 100 MG tablet Take 200 mg by mouth at bedtime.    Yes Historical Provider, MD  Thiamine HCl (VITAMIN B-1) 250 MG tablet Take 250 mg by mouth daily.   Yes Historical Provider, MD  vitamin B-12 (CYANOCOBALAMIN) 500 MCG tablet Take 500 mcg by mouth daily.   Yes Historical Provider, MD  vitamin E 400 UNIT capsule Take 400 Units by mouth daily.   Yes Historical Provider, MD  predniSONE (DELTASONE) 20 MG tablet Take 2 tablets on Monday and Tuesday and one tablet on Wednesday through Friday 02/12/15   Ahmed Prima, MD    REVIEW OF SYSTEMS:   Review of Systems  Constitutional: Negative for fever, chills, weight loss and malaise/fatigue.  HENT: Negative for ear pain, hearing loss and tinnitus.   Eyes: Negative for blurred vision, double vision, pain and redness.  Respiratory: Positive for shortness of breath. Negative for cough and hemoptysis.   Cardiovascular: Positive for chest pain. Negative for palpitations, orthopnea and leg swelling.  Gastrointestinal: Negative for nausea, vomiting, abdominal pain, diarrhea and constipation.  Genitourinary: Negative for dysuria, frequency and hematuria.  Musculoskeletal: Negative for back pain, joint pain and neck pain.  Skin:       No acne, rash, or lesions  Neurological: Negative for dizziness, tremors, focal weakness and weakness.  Endo/Heme/Allergies: Negative for polydipsia. Does not bruise/bleed easily.  Psychiatric/Behavioral: Negative for depression. The patient is not  nervous/anxious and does not have insomnia.      VITAL SIGNS:   Filed Vitals:   02/12/15 2320 02/12/15 2330 02/13/15 0000 02/13/15 0030  BP: 156/80 122/90 144/85 147/85  Pulse: 74 79 82 74  Temp:      TempSrc:      Resp: 18     Height:      Weight:      SpO2: 100% 100% 100% 97%   Wt Readings from Last 3 Encounters:  02/12/15 165.563 kg (365 lb)  12/29/14 165.109 kg (364 lb)  11/03/14 165.109 kg (364 lb)    PHYSICAL EXAMINATION:  Physical Exam  Vitals reviewed. Constitutional: She is oriented to person, place, and time. She appears well-developed and well-nourished. No distress.  HENT:  Head: Normocephalic and atraumatic.  Mouth/Throat: Oropharynx is clear and moist.  Eyes: Conjunctivae and EOM are normal. Pupils are equal, round, and reactive to light. No scleral icterus.  Neck: Normal range of motion. Neck supple. No JVD present. No thyromegaly present.  Cardiovascular: Normal rate, regular rhythm and intact distal pulses.  Exam reveals no gallop and no friction rub.   No murmur  heard. Respiratory: Effort normal and breath sounds normal. No respiratory distress. She has no wheezes. She has no rales.  GI: Soft. Bowel sounds are normal. She exhibits no distension. There is no tenderness.  Musculoskeletal: Normal range of motion. She exhibits no edema.  No arthritis, no gout  Lymphadenopathy:    She has no cervical adenopathy.  Neurological: She is alert and oriented to person, place, and time. No cranial nerve deficit.  No dysarthria, no aphasia  Skin: Skin is warm and dry. No rash noted. No erythema.  Psychiatric: She has a normal mood and affect. Her behavior is normal. Judgment and thought content normal.    LABORATORY PANEL:   CBC  Recent Labs Lab 02/12/15 2115  WBC 7.3  HGB 11.4*  HCT 34.4*  PLT 267   ------------------------------------------------------------------------------------------------------------------  Chemistries   Recent Labs Lab 02/12/15 2115  NA 136  K 3.7  CL 102  CO2 27  GLUCOSE 125*  BUN 13  CREATININE 0.84  CALCIUM 8.5*  AST 20  ALT 22  ALKPHOS 74  BILITOT 0.5   ------------------------------------------------------------------------------------------------------------------  Cardiac Enzymes  Recent Labs Lab 02/12/15 2115  TROPONINI <0.03   ------------------------------------------------------------------------------------------------------------------  RADIOLOGY:  Dg Chest 1 View  02/12/2015  CLINICAL DATA:  Chest pain and shortness of breath for 2 days. EXAM: CHEST 1 VIEW COMPARISON:  Frontal and lateral views 12/29/2014 FINDINGS: Lower lung volumes from prior exam leading to crowding of bronchovascular structures. Possible vascular congestion versus differences in technique. Limited basilar assessment due to soft tissue attenuation from body habitus. The cardiomediastinal contours are unchanged. No evident focal consolidation, pleural effusion or pneumothorax. IMPRESSION: Hypoventilatory chest. Vascular  congestion versus secondary to technique and low lung volumes. Electronically Signed   By: Jeb Levering M.D.   On: 02/12/2015 22:19    EKG:   Orders placed or performed during the hospital encounter of 12/29/14  . EKG 12-Lead  . EKG 12-Lead  . ED EKG  . ED EKG  . EKG    IMPRESSION AND PLAN:  Principal Problem:   Chest pain - unclear etiology at this time. Pain is not reproducible on palpation. She describes it with some elements of typical cardiac pain, however her first set of cardiac enzymes 24 hours after onset of symptoms is still negative. We will trend serial enzymes, get an echocardiogram, and  a cardiology consult. Also some question from her chest x-ray results of possible heart failure. We will also check a BNP. Active Problems:   Diabetes mellitus ([place]) - hold home anti-glycemic, use sliding scale insulin with responding glucose checks every 6 hours for now while nothing by mouth, and before meals at bedtime once she can start a carb modified/heart healthy diet.   Hypertension - continue home antihypertensives, use IV when necessary antihypertensives for blood pressure goal less than 160/100   Anxiety - continue home anxiolytics   Seizure disorder ([place]) - continue home antiepileptics   GERD (gastroesophageal reflux disease) - equivalent home dose PPI  All the records are reviewed and case discussed with ED provider. Management plans discussed with the patient and/or family.  DVT PROPHYLAXIS: SubQ lovenox  ADMISSION STATUS: Observation  CODE STATUS: Full  TOTAL TIME TAKING CARE OF THIS PATIENT: 45 minutes.    Kaci Freel [place] 02/13/2015, 12:50 AM  [facility]  Office  [phone]  CC: Primary care physician; Marguerita Merles, MD

## 2015-02-13 NOTE — Progress Notes (Signed)
*  PRELIMINARY RESULTS* Echocardiogram 2D Echocardiogram has been performed.  Denise Davis 02/13/2015, 5:36 PM

## 2015-02-13 NOTE — Care Management Obs Status (Signed)
MEDICARE OBSERVATION STATUS NOTIFICATION   Patient Details  Name: Denise Davis MRN: [id number] Date of Birth: [birth year]   Medicare Observation Status Notification Given:       Katrina Stack, RN 02/13/2015, 10:54 AM

## 2015-02-13 NOTE — Progress Notes (Signed)
Patient alert and oriented x4, c/o chest pain -- pain medication given. vss at this time. Patient NSR on telemetry. Will continue to assess. Denise Davis

## 2015-02-13 NOTE — Care Management (Signed)
Observation order dated 11/14 for chest pain.  Cardiology has evaluated and troponins are negative.  Attending feels pain is muscular skeletal and will start Indocin.  Anticipate discharge 11/15

## 2015-02-13 NOTE — Progress Notes (Signed)
[name] NAME: Denise Davis    MR#:  [id number]  DATE OF BIRTH:  04-Jul-1965  SUBJECTIVE:  CHIEF COMPLAINT:   Chief Complaint  Patient presents with  . Chest Pain   patient is a 49 year old morbidly obese female who presents to the hospital with complaints of chest pains in the anterior aspect of her chest associated with deep breathing. Her EKG was unremarkable. Cardiac enzymes were negative. Cardiologist recommended echocardiogram, which is still pending. Patient still complains of chest pains, especially with deep inspirations, but denies any pain on palpation or moving around.   Review of Systems  Constitutional: Negative for fever, chills and weight loss.  HENT: Negative for congestion.   Eyes: Negative for blurred vision and double vision.  Respiratory: Negative for cough, sputum production, shortness of breath and wheezing.   Cardiovascular: Positive for chest pain. Negative for palpitations, orthopnea, leg swelling and PND.  Gastrointestinal: Negative for nausea, vomiting, abdominal pain, diarrhea, constipation and blood in stool.  Genitourinary: Negative for dysuria, urgency, frequency and hematuria.  Musculoskeletal: Negative for falls.  Neurological: Negative for dizziness, tremors, focal weakness and headaches.  Endo/Heme/Allergies: Does not bruise/bleed easily.  Psychiatric/Behavioral: Negative for depression. The patient does not have insomnia.     VITAL SIGNS: Blood pressure 123/71, pulse 86, temperature 98.4 F (36.9 C), temperature source Oral, resp. rate 18, height 5\' 5"  (1.651 m), weight 169.827 kg (374 lb 6.4 oz), SpO2 99 %.  PHYSICAL EXAMINATION:   GENERAL:  49 y.o.-year-old patient lying in the bed with no acute distress.  EYES: Pupils equal, round, reactive to light and accommodation. No scleral icterus. Extraocular muscles intact.  HEENT: Head atraumatic, normocephalic. Oropharynx and nasopharynx  clear.  NECK:  Supple, no jugular venous distention. No thyroid enlargement, no tenderness.  LUNGS: Normal breath sounds bilaterally, no wheezing, rales,rhonchi or crepitation. No use of accessory muscles of respiration.  CARDIOVASCULAR: S1, S2 normal. No murmurs, rubs, or gallops.  ABDOMEN: Soft, nontender, nondistended. Bowel sounds present. No organomegaly or mass.  EXTREMITIES: No pedal edema, cyanosis, or clubbing.  NEUROLOGIC: Cranial nerves II through XII are intact. Muscle strength 5/5 in all extremities. Sensation intact. Gait not checked.  PSYCHIATRIC: The patient is alert and oriented x 3.  SKIN: No obvious rash, lesion, or ulcer.   ORDERS/RESULTS REVIEWED:   CBC  Recent Labs Lab 02/12/15 2115 02/13/15 0421  WBC 7.3 9.0  HGB 11.4* 12.7  HCT 34.4* 38.2  PLT 267 256  MCV 85.9 87.0  MCH 28.5 28.9  MCHC 33.2 33.2  RDW 15.7* 15.8*   ------------------------------------------------------------------------------------------------------------------  Chemistries   Recent Labs Lab 02/12/15 2115 02/13/15 0421  NA 136 135  K 3.7 4.0  CL 102 101  CO2 27 27  GLUCOSE 125* 182*  BUN 13 12  CREATININE 0.84 0.82  CALCIUM 8.5* 8.8*  AST 20  --   ALT 22  --   ALKPHOS 74  --   BILITOT 0.5  --    ------------------------------------------------------------------------------------------------------------------ estimated creatinine clearance is 133.8 mL/min (by C-G formula based on Cr of 0.82). ------------------------------------------------------------------------------------------------------------------ No results for input(s): TSH, T4TOTAL, T3FREE, THYROIDAB in the last 72 hours.  Invalid input(s): FREET3  Cardiac Enzymes  Recent Labs Lab 02/12/15 2115 02/13/15 0421 02/13/15 1014  TROPONINI <0.03 <0.03 <0.03   ------------------------------------------------------------------------------------------------------------------ Invalid input(s):  POCBNP ---------------------------------------------------------------------------------------------------------------  RADIOLOGY: Dg Chest 1 View  02/12/2015  CLINICAL DATA:  Chest pain and shortness of breath for 2 days. EXAM: CHEST  1 VIEW COMPARISON:  Frontal and lateral views 12/29/2014 FINDINGS: Lower lung volumes from prior exam leading to crowding of bronchovascular structures. Possible vascular congestion versus differences in technique. Limited basilar assessment due to soft tissue attenuation from body habitus. The cardiomediastinal contours are unchanged. No evident focal consolidation, pleural effusion or pneumothorax. IMPRESSION: Hypoventilatory chest. Vascular congestion versus secondary to technique and low lung volumes. Electronically Signed   By: Jeb Levering M.D.   On: 02/12/2015 22:19    EKG:  Orders placed or performed during the hospital encounter of 02/12/15  . EKG 12-Lead  . EKG 12-Lead    ASSESSMENT AND PLAN:  Principal Problem:   Chest pain Active Problems:   Diabetes mellitus ([facility])   Hypertension   Anxiety   Seizure disorder ([facility])   GERD (gastroesophageal reflux disease) 1. Atypical chest pain, pleuritic in nature, etiology remains unclear. Echocardiogram is pending. Get VQ scan 2. Essential hypertension, relatively well controlled 3. Morbid obesity, get Doppler ultrasound of lower extremities to rule out DVT 4. Diabetes mellitus with hemoglobin A1c 6.5, continue home therapy as well as sliding scale insulin while in the hospital   Management plans discussed with the patient, family and they are in agreement.   DRUG ALLERGIES:  Allergies  Allergen Reactions  . Lisinopril Cough  . Milk-Related Compounds     Diarrhea and vomiting  "only milk causes problems. Milk products are ok"    CODE STATUS:     Code Status Orders        Start     Ordered   02/13/15 0205  Full code   Continuous     02/13/15 0204      TOTAL TIME TAKING CARE OF THIS  PATIENT: 40 minutes.    Theodoro Grist M.D on 02/13/2015 at 4:12 PM  Between 7am to 6pm - Pager - [phone]  After 6pm go to [email or web address] - password [email or web address] [facility]  Office  [phone]  CC: Primary care physician; Marguerita Merles, MD

## 2015-02-13 NOTE — Consult Note (Signed)
Primary Cardiologist: None   Reason for Consultation :Unstable angina    HPI : This is a 36 yoF with PMHx HTN, HL, DM who presented to ER last night c/o chest pain since Saturday. She states this pain woke her from her sleep, substernal with radiation to left chest and left shoulder, is described as sharp and pleuritic with concurrent nausea and diaphoresis. She states pain is worse with exertion, and was very short of breath with minimal movement.        Review of Systems: General: negative for chills, fever, night sweats or weight changes.  Cardiovascular: positive for chest pain,shortness of breath and dyspnea on exertion, negative for edema, orthopnea, palpitations, paroxysmal nocturnal dyspnea,  Dermatological: negative for rash Respiratory: negative for cough or wheezing Urologic: negative for hematuria Abdominal: positive for nausea, negative for vomiting, diarrhea, bright red blood per rectum, melena, or hematemesis Neurologic: negative for visual changes, syncope, or dizziness All other systems reviewed and are otherwise negative except as noted above.    Past Medical History  Diagnosis Date  . Diabetes mellitus   . Hypertension   . DVT of leg (deep venous thrombosis) ([place]) 12/2007    left leg, given warfarin for 6 mos  . Morbid obesity ([place])   . Renal cell cancer ([place]) 2009    S/p nephrectomy (left)  . Pericarditis     Diagnosed/treated at [facility]; per patient 4 episodes per cardiologist Dr. Edwin Dada persistant chest pain unclear if pericarditis verusus neuropsychogenic   . H/O hiatal hernia   . Anxiety   . Shortness of breath   . Seizure disorder ([place])     Medications Prior to Admission  Medication Sig Dispense Refill  . albuterol (PROVENTIL HFA) 108 (90 BASE) MCG/ACT inhaler Inhale 2 puffs into the lungs every 4 (four) hours as needed for wheezing or shortness of breath. 1 Inhaler 0  . aspirin EC 81 MG tablet Take 81 mg by mouth daily.    . carvedilol (COREG)  12.5 MG tablet Take 25 mg by mouth 2 (two) times daily with a meal.     . Cholecalciferol (VITAMIN D-3) 1000 UNITS CAPS Take 1 capsule by mouth daily.    Marland Kitchen exenatide (BYETTA) 5 MCG/0.02ML SOPN injection Inject 5 mcg into the skin 2 (two) times daily with a meal.    . ferrous sulfate 325 (65 FE) MG tablet Take 325 mg by mouth daily with breakfast.    . gabapentin (NEURONTIN) 300 MG capsule Take 1,800 mg by mouth at bedtime.    . hydrochlorothiazide (HYDRODIURIL) 25 MG tablet Take 25 mg by mouth daily.    Marland Kitchen HYDROcodone-acetaminophen (NORCO/VICODIN) 5-325 MG tablet Take 1 tablet by mouth every 4 (four) hours as needed for moderate pain. 12 tablet 0  . ibuprofen (ADVIL,MOTRIN) 600 MG tablet Take 1 tablet by mouth three times daily with meals 15 tablet 0  . levETIRAcetam (KEPPRA) 750 MG tablet Take 1,500 mg by mouth 2 (two) times daily.    Marland Kitchen loratadine (CLARITIN) 10 MG tablet Take 10 mg by mouth at bedtime.    . metFORMIN (GLUCOPHAGE) 1000 MG tablet Take 1,000 mg by mouth 2 (two) times daily with a meal.    . metoCLOPramide (REGLAN) 10 MG tablet Take 10 mg by mouth 3 (three) times daily before meals.    . nitroGLYCERIN (NITROSTAT) 0.4 MG SL tablet Place 0.4 mg under the tongue every 5 (five) minutes as needed for chest pain. (may repeat every 5 minutes but seek medical  help if pain persists after 3 tablets)    . omeprazole (PRILOSEC) 40 MG capsule Take 40 mg by mouth daily.    . Pediatric Multivit-Minerals-C (MULTIVITAMIN GUMMIES CHILDRENS PO) Take 1 tablet by mouth daily.    . QUEtiapine (SEROQUEL) 100 MG tablet Take 100 mg by mouth at bedtime.    . sertraline (ZOLOFT) 100 MG tablet Take 200 mg by mouth at bedtime.     . Thiamine HCl (VITAMIN B-1) 250 MG tablet Take 250 mg by mouth daily.    . vitamin B-12 (CYANOCOBALAMIN) 500 MCG tablet Take 500 mcg by mouth daily.    . vitamin E 400 UNIT capsule Take 400 Units by mouth daily.       Marland Kitchen amLODipine  10 mg Oral Daily  . aspirin EC  81 mg Oral Daily   . carvedilol  25 mg Oral BID WC  . enoxaparin (LOVENOX) injection  40 mg Subcutaneous BID  . levETIRAcetam  1,500 mg Oral BID  . oxyCODONE-acetaminophen  2 tablet Oral Once  . pantoprazole  40 mg Oral Daily  . QUEtiapine  100 mg Oral QHS  . sertraline  200 mg Oral QHS  . sodium chloride  3 mL Intravenous Q12H    Infusions:    Allergies  Allergen Reactions  . Lisinopril Cough  . Milk-Related Compounds     Diarrhea and vomiting  "only milk causes problems. Milk products are ok"    Social History   Social History  . Marital Status: Married    Spouse Name: N/A  . Number of Children: N/A  . Years of Education: N/A   Occupational History  . Not on file.   Social History Main Topics  . Smoking status: Former Research scientist (life sciences)  . Smokeless tobacco: Never Used  . Alcohol Use: No  . Drug Use: No  . Sexual Activity: Not on file   Other Topics Concern  . 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 Hypertension Mother   . Breast cancer Mother   . Heart disease Paternal Grandmother     PHYSICAL EXAM: Filed Vitals:   02/13/15 0801  BP: 165/97  Pulse: 75  Temp:   Resp:      Intake/Output Summary (Last 24 hours) at 02/13/15 0859 Last data filed at 02/13/15 0510  Gross per 24 hour  Intake      0 ml  Output    851 ml  Net   -851 ml    General: Appears to be in distress. Speaking softly  HEENT: normal Neck: supple. no JVD. Carotids 2+ bilat; no bruits. No lymphadenopathy or thryomegaly appreciated. Cor: PMI nondisplaced. Regular rate & rhythm. No rubs, gallops or murmurs. Lungs: clear Abdomen: soft, nontender, nondistended. No hepatosplenomegaly. No bruits or masses. Good bowel sounds. Extremities: no cyanosis, clubbing, rash, edema Neuro: alert & oriented x 3, cranial  nerves grossly intact. moves all 4 extremities w/o difficulty. Affect pleasant.  ECG: NSR 79 BPM low voltage NS ST/T changes  Results for orders placed or performed during the hospital encounter of 02/12/15 (from the past 24 hour(s))  CBC     Status: Abnormal   Collection Time: 02/12/15  9:15 PM  Result Value Ref Range   WBC 7.3  3.6 - 11.0 K/uL   RBC 4.00 3.80 - 5.20 MIL/uL   Hemoglobin 11.4 (L) 12.0 - 16.0 g/dL   HCT 34.4 (L) 35.0 - 47.0 %   MCV 85.9 80.0 - 100.0 fL   MCH 28.5 26.0 - 34.0 pg   MCHC 33.2 32.0 - 36.0 g/dL   RDW 15.7 (H) 11.5 - 14.5 %   Platelets 267 150 - 440 K/uL  Troponin I     Status: None   Collection Time: 02/12/15  9:15 PM  Result Value Ref Range   Troponin I <0.03 <0.031 ng/mL  Comprehensive metabolic panel     Status: Abnormal   Collection Time: 02/12/15  9:15 PM  Result Value Ref Range   Sodium 136 135 - 145 mmol/L   Potassium 3.7 3.5 - 5.1 mmol/L   Chloride 102 101 - 111 mmol/L   CO2 27 22 - 32 mmol/L   Glucose, Bld 125 (H) 65 - 99 mg/dL   BUN 13 6 - 20 mg/dL   Creatinine, Ser 0.84 0.44 - 1.00 mg/dL   Calcium 8.5 (L) 8.9 - 10.3 mg/dL   Total Protein 6.8 6.5 - 8.1 g/dL   Albumin 3.5 3.5 - 5.0 g/dL   AST 20 15 - 41 U/L   ALT 22 14 - 54 U/L   Alkaline Phosphatase 74 38 - 126 U/L   Total Bilirubin 0.5 0.3 - 1.2 mg/dL   GFR calc non Af Amer >60 >60 mL/min   GFR calc Af Amer >60 >60 mL/min   Anion gap 7 5 - 15  Fibrin derivatives D-Dimer ([facility] only)     Status: None   Collection Time: 02/12/15  9:15 PM  Result Value Ref Range   Fibrin derivatives D-dimer ([facility]) 477 0 - 499  Brain natriuretic peptide     Status: None   Collection Time: 02/12/15  9:15 PM  Result Value Ref Range   B Natriuretic Peptide 73.0 0.0 - 100.0 pg/mL  Brain natriuretic peptide     Status: Abnormal   Collection Time: 02/13/15  4:21 AM  Result Value Ref Range   B Natriuretic Peptide 125.0 (H) 0.0 - 100.0 pg/mL  Troponin I     Status: None   Collection Time: 02/13/15  4:21  AM  Result Value Ref Range   Troponin I <0.03 <0.031 ng/mL  Basic metabolic panel     Status: Abnormal   Collection Time: 02/13/15  4:21 AM  Result Value Ref Range   Sodium 135 135 - 145 mmol/L   Potassium 4.0 3.5 - 5.1 mmol/L   Chloride 101 101 - 111 mmol/L   CO2 27 22 - 32 mmol/L   Glucose, Bld 182 (H) 65 - 99 mg/dL   BUN 12 6 - 20 mg/dL   Creatinine, Ser 0.82 0.44 - 1.00 mg/dL   Calcium 8.8 (L) 8.9 - 10.3 mg/dL   GFR calc non Af Amer >60 >60 mL/min   GFR calc Af Amer >60 >60 mL/min   Anion gap 7 5 - 15  CBC     Status: Abnormal   Collection Time: 02/13/15  4:21 AM  Result Value Ref Range   WBC 9.0 3.6 - 11.0 K/uL   RBC 4.39 3.80 - 5.20 MIL/uL   Hemoglobin 12.7 12.0 - 16.0 g/dL   HCT 38.2 35.0 - 47.0 %   MCV 87.0 80.0 - 100.0 fL   MCH 28.9 26.0 - 34.0 pg   MCHC 33.2 32.0 - 36.0 g/dL   RDW 15.8 (  H) 11.5 - 14.5 %   Platelets 256 150 - 440 K/uL  Glucose, capillary     Status: Abnormal   Collection Time: 02/13/15  6:25 AM  Result Value Ref Range   Glucose-Capillary 174 (H) 65 - 99 mg/dL   Comment 1 Notify RN    Dg Chest 1 View  02/12/2015  CLINICAL DATA:  Chest pain and shortness of breath for 2 days. EXAM: CHEST 1 VIEW COMPARISON:  Frontal and lateral views 12/29/2014 FINDINGS: Lower lung volumes from prior exam leading to crowding of bronchovascular structures. Possible vascular congestion versus differences in technique. Limited basilar assessment due to soft tissue attenuation from body habitus. The cardiomediastinal contours are unchanged. No evident focal consolidation, pleural effusion or pneumothorax. IMPRESSION: Hypoventilatory chest. Vascular congestion versus secondary to technique and low lung volumes. Electronically Signed   By: Jeb Levering M.D.   On: 02/12/2015 22:19     ASSESSMENT: Atypical Chest Pain   PLAN/DISCUSSION: Patient had cardiac catheterization June 2015 which only showed 20-30% stenosis and RCA, no significant coronary artery disease. EKG with  nonspecific findings, chest pain is atypical. If echocardiogram shows normal wall motion and normal LVEF, patient can go home with outpatient nuclear stress test tomorrow in our office.   Patient and plan discussed with supervising provider, Dr. Neoma Laming, who agrees with above findings.   Kelby Fam [place], [place] 02/13/2015 8:59 AM

## 2015-02-14 ENCOUNTER — Observation Stay: Payer: PPO

## 2015-02-14 ENCOUNTER — Encounter: Payer: Self-pay | Admitting: Radiology

## 2015-02-14 LAB — GLUCOSE, CAPILLARY
GLUCOSE-CAPILLARY: 130 mg/dL — AB (ref 65–99)
GLUCOSE-CAPILLARY: 144 mg/dL — AB (ref 65–99)
GLUCOSE-CAPILLARY: 161 mg/dL — AB (ref 65–99)

## 2015-02-14 MED ORDER — AMLODIPINE BESYLATE 10 MG PO TABS: 10.0000 mg | ORAL_TABLET | Freq: Every day | ORAL | Status: DC

## 2015-02-14 MED ORDER — OXYCODONE-ACETAMINOPHEN 5-325 MG PO TABS: 2.0000 | ORAL_TABLET | Freq: Once | ORAL | Status: DC

## 2015-02-14 MED ORDER — TECHNETIUM TO 99M ALBUMIN AGGREGATED
4.1500 | Freq: Once | INTRAVENOUS | Status: AC | PRN
  Administered 2015-02-14: 4.15 via INTRAVENOUS

## 2015-02-14 MED ORDER — TECHNETIUM TC 99M DIETHYLENETRIAME-PENTAACETIC ACID
43.3600 | Freq: Once | INTRAVENOUS | Status: DC | PRN
  Administered 2015-02-14: 43.36 via INTRAVENOUS
  Filled 2015-02-14: qty 43.36

## 2015-02-14 MED ORDER — HYDROCODONE-ACETAMINOPHEN 5-325 MG PO TABS: 1.0000 | ORAL_TABLET | ORAL | Status: DC | PRN

## 2015-02-14 NOTE — Discharge Summary (Signed)
[place] [facility] [place] NAME: Denise Davis    MR#:  [id number]  DATE OF BIRTH:  08/14/1965  DATE OF ADMISSION:  02/12/2015 ADMITTING PHYSICIAN: Lance Coon, MD  DATE OF DISCHARGE: No discharge date for patient encounter.  PRIMARY CARE PHYSICIAN: Marguerita Merles, MD     ADMISSION DIAGNOSIS:  Shortness of breath [R06.02] Acute on chronic respiratory failure, unspecified whether with hypoxia or hypercapnia (HCC) [J96.20] Chest pain, unspecified chest pain type [R07.9]  DISCHARGE DIAGNOSIS:  Principal Problem:   Pleuritic chest pain Active Problems:   Hypertension   Diabetes mellitus ([place])   Anxiety   Seizure disorder (HCC)   GERD (gastroesophageal reflux disease)   SECONDARY DIAGNOSIS:   Past Medical History  Diagnosis Date  . Diabetes mellitus   . Hypertension   . DVT of leg (deep venous thrombosis) ([place]) 12/2007    left leg, given warfarin for 6 mos  . Morbid obesity ([place])   . Renal cell cancer ([place]) 2009    S/p nephrectomy (left)  . Pericarditis     Diagnosed/treated at [facility]; per patient 4 episodes per cardiologist Dr. Edwin Dada persistant chest pain unclear if pericarditis verusus neuropsychogenic   . H/O hiatal hernia   . Anxiety   . Shortness of breath   . Seizure disorder ([place])     .pro HOSPITAL COURSE:   Patient is 48 year old African-American female with past medical history significant for history of morbid obesity diabetes, vocal cord dysfunction who presents to the hospital with complaints of pleuritic chest pain. Admission labs were remarkable for glucose level 180. Cardiac enzymes were normal 2. CBC was unremarkable. D-dimer was normal. Lower extremity Dopplers were negative for DVT. VQ scan was negative. Echocardiogram revealed normal ejection fraction and grade 1 diastolic dysfunction. Patient was seen by cardiologist. Will follow patient along and recommended outpatient follow-up . Patient was initiated on  nonsteroidal anti-inflammatory medications for pain as well as Norco and her condition improved. It was felt that patient's pleuritic chest pain, very likely was musculoskeletal.  Discussion by problem 1. Pleuritic chest pain,  etiology is  likely musculoskeletal. Echocardiogram,  VQ scan were unremarkable. Cardiologist saw patient in consultation and recommended outpatient follow-up clinic enzymes were negative. Patient is ambulated and her pain did not worsen with exertion , it improved with nonsteroidal anti-inflammatory medications. Patient receives anti-inflammatory medications at home 3 times a day and if her pain does not improve, she may need to be evaluated by gastroenterologist to rule out gastroesophageal problem. 2. Essential hypertension, relatively well controlled 3. Morbid obesity, Doppler ultrasound of lower extremities showed no DVT 4. Diabetes mellitus with hemoglobin A1c 6.5, continue home therapy, diet DISCHARGE CONDITIONS:   [place]  CONSULTS OBTAINED:  Treatment Team:  Dionisio David, MD  DRUG ALLERGIES:   Allergies  Allergen Reactions  . Lisinopril Cough  . Milk-Related Compounds     Diarrhea and vomiting  "only milk causes problems. Milk products are ok"    DISCHARGE MEDICATIONS:   Current Discharge Medication List    START taking these medications   Details  amLODipine (NORVASC) 10 MG tablet Take 1 tablet (10 mg total) by mouth daily. Qty: 30 tablet, Refills: 6      CONTINUE these medications which have CHANGED   Details  HYDROcodone-acetaminophen (NORCO/VICODIN) 5-325 MG tablet Take 1 tablet by mouth every 4 (four) hours as needed for moderate pain. Qty: 20 tablet, Refills: 0    predniSONE (DELTASONE) 20 MG tablet Take 2  tablets on Monday and Tuesday and one tablet on Wednesday through Friday Qty: 7 tablet, Refills: 0      CONTINUE these medications which have NOT CHANGED   Details  albuterol (PROVENTIL HFA) 108 (90 BASE) MCG/ACT inhaler Inhale 2  puffs into the lungs every 4 (four) hours as needed for wheezing or shortness of breath. Qty: 1 Inhaler, Refills: 0    aspirin EC 81 MG tablet Take 81 mg by mouth daily.    carvedilol (COREG) 12.5 MG tablet Take 25 mg by mouth 2 (two) times daily with a meal.     Cholecalciferol (VITAMIN D-3) 1000 UNITS CAPS Take 1 capsule by mouth daily.    exenatide (BYETTA) 5 MCG/0.02ML SOPN injection Inject 5 mcg into the skin 2 (two) times daily with a meal.    ferrous sulfate 325 (65 FE) MG tablet Take 325 mg by mouth daily with breakfast.    gabapentin (NEURONTIN) 300 MG capsule Take 1,800 mg by mouth at bedtime.    hydrochlorothiazide (HYDRODIURIL) 25 MG tablet Take 25 mg by mouth daily.    ibuprofen (ADVIL,MOTRIN) 600 MG tablet Take 1 tablet by mouth three times daily with meals Qty: 15 tablet, Refills: 0    levETIRAcetam (KEPPRA) 750 MG tablet Take 1,500 mg by mouth 2 (two) times daily.    loratadine (CLARITIN) 10 MG tablet Take 10 mg by mouth at bedtime.    metFORMIN (GLUCOPHAGE) 1000 MG tablet Take 1,000 mg by mouth 2 (two) times daily with a meal.    metoCLOPramide (REGLAN) 10 MG tablet Take 10 mg by mouth 3 (three) times daily before meals.    nitroGLYCERIN (NITROSTAT) 0.4 MG SL tablet Place 0.4 mg under the tongue every 5 (five) minutes as needed for chest pain. (may repeat every 5 minutes but seek medical help if pain persists after 3 tablets)    omeprazole (PRILOSEC) 40 MG capsule Take 40 mg by mouth daily.    Pediatric Multivit-Minerals-C (MULTIVITAMIN GUMMIES CHILDRENS PO) Take 1 tablet by mouth daily.    QUEtiapine (SEROQUEL) 100 MG tablet Take 100 mg by mouth at bedtime.    sertraline (ZOLOFT) 100 MG tablet Take 200 mg by mouth at bedtime.     Thiamine HCl (VITAMIN B-1) 250 MG tablet Take 250 mg by mouth daily.    vitamin B-12 (CYANOCOBALAMIN) 500 MCG tablet Take 500 mcg by mouth daily.    vitamin E 400 UNIT capsule Take 400 Units by mouth daily.         DISCHARGE  INSTRUCTIONS:    Patient is to follow-up with primary care physician and cardiologist as outpatient, she may benefit from a gastroenterology evaluation if her chest pain is relentless due to history of nonsteroidal anti-inflammatory medication use  If you experience worsening of your admission symptoms, develop shortness of breath, life threatening emergency, suicidal or homicidal thoughts you must seek medical attention immediately by calling 911 or calling your MD immediately  if symptoms less severe.  You Must read complete instructions/literature along with all the possible adverse reactions/side effects for all the Medicines you take and that have been prescribed to you. Take any new Medicines after you have completely understood and accept all the possible adverse reactions/side effects.   Please note  You were cared for by a hospitalist during your hospital stay. If you have any questions about your discharge medications or the care you received while you were in the hospital after you are discharged, you can call the unit and asked to  speak with the hospitalist on call if the hospitalist that took care of you is not available. Once you are discharged, your primary care physician will handle any further medical issues. Please note that NO REFILLS for any discharge medications will be authorized once you are discharged, as it is imperative that you return to your primary care physician (or establish a relationship with a primary care physician if you do not have one) for your aftercare needs so that they can reassess your need for medications and monitor your lab values.    Today   CHIEF COMPLAINT:   Chief Complaint  Patient presents with  . Chest Pain    HISTORY OF PRESENT ILLNESS:  Denise Davis  is a 49 y.o. female with a known history of morbid obesity diabetes, vocal cord dysfunction who presents to the hospital with complaints of pleuritic chest pain. Admission labs were  remarkable for glucose level 180. Cardiac enzymes were normal 2. CBC was unremarkable. D-dimer was normal. Lower extremity Dopplers were negative for DVT. VQ scan was negative. Echocardiogram revealed normal ejection fraction and grade 1 diastolic dysfunction. Patient was seen by cardiologist. Will follow patient along and recommended outpatient follow-up . Patient was initiated on nonsteroidal anti-inflammatory medications for pain as well as Norco and her condition improved. It was felt that patient's pleuritic chest pain, very likely was musculoskeletal.  Discussion by problem 1. Pleuritic chest pain,  etiology is  likely musculoskeletal. Echocardiogram,  VQ scan were unremarkable. Cardiologist saw patient in consultation and recommended outpatient follow-up clinic enzymes were negative. Patient is ambulated and her pain did not worsen with exertion , it improved with nonsteroidal anti-inflammatory medications. Patient receives anti-inflammatory medications at home 3 times a day and if her pain does not improve, she may need to be evaluated by gastroenterologist to rule out gastroesophageal problem. 2. Essential hypertension, relatively well controlled 3. Morbid obesity, Doppler ultrasound of lower extremities showed no DVT 4. Diabetes mellitus with hemoglobin A1c 6.5, continue home therapy, diet    VITAL SIGNS:  Blood pressure 136/73, pulse 73, temperature 98 F (36.7 C), temperature source Oral, resp. rate 18, height 5\' 5"  (1.651 m), weight 169.827 kg (374 lb 6.4 oz), SpO2 97 %.  I/O:   Intake/Output Summary (Last 24 hours) at 02/14/15 1431 Last data filed at 02/14/15 1200  Gross per 24 hour  Intake    720 ml  Output      0 ml  Net    720 ml    PHYSICAL EXAMINATION:  GENERAL:  49 y.o.-year-old patient lying in the bed with no acute distress.  EYES: Pupils equal, round, reactive to light and accommodation. No scleral icterus. Extraocular muscles intact.  HEENT: Head atraumatic,  normocephalic. Oropharynx and nasopharynx clear.  NECK:  Supple, no jugular venous distention. No thyroid enlargement, no tenderness.  LUNGS: Normal breath sounds bilaterally, no wheezing, rales,rhonchi or crepitation. No use of accessory muscles of respiration.  CARDIOVASCULAR: S1, S2 normal. No murmurs, rubs, or gallops.  ABDOMEN: Soft, non-tender, non-distended. Bowel sounds present. No organomegaly or mass.  EXTREMITIES: No pedal edema, cyanosis, or clubbing.  NEUROLOGIC: Cranial nerves II through XII are intact. Muscle strength 5/5 in all extremities. Sensation intact. Gait not checked.  PSYCHIATRIC: The patient is alert and oriented x 3.  SKIN: No obvious rash, lesion, or ulcer.   DATA REVIEW:   CBC  Recent Labs Lab 02/13/15 0421  WBC 9.0  HGB 12.7  HCT 38.2  PLT 256    Chemistries  Recent Labs Lab 02/12/15 2115 02/13/15 0421  NA 136 135  K 3.7 4.0  CL 102 101  CO2 27 27  GLUCOSE 125* 182*  BUN 13 12  CREATININE 0.84 0.82  CALCIUM 8.5* 8.8*  AST 20  --   ALT 22  --   ALKPHOS 74  --   BILITOT 0.5  --     Cardiac Enzymes  Recent Labs Lab 02/13/15 1014  [place] <0.03    Microbiology Results  Results for orders placed or performed during the hospital encounter of 02/12/15  MRSA PCR Screening     Status: None   Collection Time: 02/13/15  9:50 AM  Result Value Ref Range Status   MRSA by PCR NEGATIVE NEGATIVE Final    Comment:        The GeneXpert MRSA Assay (FDA approved for NASAL specimens only), is one component of a comprehensive MRSA colonization surveillance program. It is not intended to diagnose MRSA infection nor to guide or monitor treatment for MRSA infections.     RADIOLOGY:  Dg Chest 1 View  02/12/2015  CLINICAL DATA:  Chest pain and shortness of breath for 2 days. EXAM: CHEST 1 VIEW COMPARISON:  Frontal and lateral views 12/29/2014 FINDINGS: Lower lung volumes from prior exam leading to crowding of bronchovascular structures.  Possible vascular congestion versus differences in technique. Limited basilar assessment due to soft tissue attenuation from body habitus. The cardiomediastinal contours are unchanged. No evident focal consolidation, pleural effusion or pneumothorax. IMPRESSION: Hypoventilatory chest. Vascular congestion versus secondary to technique and low lung volumes. Electronically Signed   By: Jeb Levering M.D.   On: 02/12/2015 22:19   Dg Chest 2 View  02/14/2015  CLINICAL DATA:  Anterior chest pain for 24 hours. Initial encounter. EXAM: CHEST  2 VIEW COMPARISON:  PA and lateral chest 12/29/2014. PA and lateral chest and CT chest 08/16/2014. FINDINGS: The lungs are clear. Heart size is normal. No pneumothorax or pleural effusion. IMPRESSION: Negative chest. Electronically Signed   By: Inge Rise M.D.   On: 02/14/2015 11:17   Nm Pulmonary Perf And Vent  02/14/2015  CLINICAL DATA:  Pleuritic chest pain since 02/11/2015. History of prior DVT. Initial encounter. EXAM: NUCLEAR MEDICINE VENTILATION - PERFUSION LUNG SCAN TECHNIQUE: Ventilation images were obtained in multiple projections using inhaled aerosol Tc-71m DTPA. Perfusion images were obtained in multiple projections after intravenous injection of Tc-20m MAA. RADIOPHARMACEUTICALS:  43.6 Technetium-80m DTPA aerosol inhalation and 4.15 Technetium-61m MAA IV COMPARISON:  PA and lateral chest earlier today. CT chest 08/16/2014. FINDINGS: Ventilation: No focal ventilation defect. Perfusion: No wedge shaped peripheral perfusion defects to suggest acute pulmonary embolism. IMPRESSION: Negative exam. Electronically Signed   By: Inge Rise M.D.   On: 02/14/2015 12:09   US Venous Img Lower Bilateral  02/13/2015  CLINICAL DATA:  Bilateral lower extremity edema for the past 2 weeks. History of prior DVT. History malignancy. Evaluate for acute or chronic DVT. EXAM: BILATERAL LOWER EXTREMITY VENOUS DOPPLER ULTRASOUND TECHNIQUE: Gray-scale sonography with graded  compression, as well as color Doppler and duplex ultrasound were performed to evaluate the lower extremity deep venous systems from the level of the common femoral vein and including the common femoral, femoral, profunda femoral, popliteal and calf veins including the posterior tibial, peroneal and gastrocnemius veins when visible. The superficial great saphenous vein was also interrogated. Spectral Doppler was utilized to evaluate flow at rest and with distal augmentation maneuvers in the common femoral, femoral and popliteal veins. COMPARISON:  None. FINDINGS:  Examination is degraded due to patient body habitus and poor sonographic window. RIGHT LOWER EXTREMITY Common Femoral Vein: No evidence of thrombus. Normal compressibility, respiratory phasicity and response to augmentation. Saphenofemoral Junction: No evidence of thrombus. Normal compressibility and flow on color Doppler imaging. Profunda Femoral Vein: No evidence of thrombus. Normal compressibility and flow on color Doppler imaging. Femoral Vein: No evidence of thrombus. Normal compressibility, respiratory phasicity and response to augmentation. Popliteal Vein: No evidence of thrombus. Normal compressibility, respiratory phasicity and response to augmentation. Calf Veins: No evidence of thrombus. Normal compressibility and flow on color Doppler imaging. Superficial Great Saphenous Vein: No evidence of thrombus. Normal compressibility and flow on color Doppler imaging. Venous Reflux:  None. Other Findings:  None. LEFT LOWER EXTREMITY Common Femoral Vein: No evidence of thrombus. Normal compressibility, respiratory phasicity and response to augmentation. Saphenofemoral Junction: No evidence of thrombus. Normal compressibility and flow on color Doppler imaging. Profunda Femoral Vein: No evidence of thrombus. Normal compressibility and flow on color Doppler imaging. Femoral Vein: No evidence of thrombus. Normal compressibility, respiratory phasicity and  response to augmentation. Popliteal Vein: No evidence of thrombus. Normal compressibility, respiratory phasicity and response to augmentation. Calf Veins: No evidence of thrombus. Normal compressibility and flow on color Doppler imaging. Superficial Great Saphenous Vein: No evidence of thrombus. Normal compressibility and flow on color Doppler imaging. Venous Reflux:  None. Other Findings:  None. IMPRESSION: No evidence of DVT within either lower extremity. Electronically Signed   By: Sandi Mariscal M.D.   On: 02/13/2015 17:37    EKG:   Orders placed or performed during the hospital encounter of 02/12/15  . EKG 12-Lead  . EKG 12-Lead      Management plans discussed with the patient, family and they are in agreement.  CODE STATUS:     Code Status Orders        Start     Ordered   02/13/15 0205  Full code   Continuous     02/13/15 0204      TOTAL TIME TAKING CARE OF THIS PATIENT: 40 minutes.    Theodoro Grist M.D on 02/14/2015 at 2:31 PM  Between 7am to 6pm - Pager - [phone]  After 6pm go to [email or web address] - password [email or web address]  Office  [phone]  CC: Primary care physician; Marguerita Merles, MD

## 2015-02-14 NOTE — Progress Notes (Signed)
   SUBJECTIVE: Pt up and moving around room, less CP today.    Filed Vitals:   02/13/15 1748 02/13/15 1749 02/13/15 2003 02/14/15 0357  BP: 157/84 157/84 149/74 138/81  Pulse: 82 82 80 77  Temp:   98.1 F (36.7 C) 97.6 F (36.4 C)  TempSrc:   Oral Oral  Resp:   16 16  Height:      Weight:      SpO2:   100% 97%    Intake/Output Summary (Last 24 hours) at 02/14/15 0853 Last data filed at 02/14/15 0525  Gross per 24 hour  Intake    480 ml  Output    700 ml  Net   -220 ml    LABS: Basic Metabolic Panel:  Recent Labs  02/12/15 2115 02/13/15 0421  NA 136 135  K 3.7 4.0  CL 102 101  CO2 27 27  GLUCOSE 125* 182*  BUN 13 12  CREATININE 0.84 0.82  CALCIUM 8.5* 8.8*   Liver Function Tests:  Recent Labs  02/12/15 2115  AST 20  ALT 22  ALKPHOS 74  BILITOT 0.5  PROT 6.8  ALBUMIN 3.5   No results for input(s): LIPASE, AMYLASE in the last 72 hours. CBC:  Recent Labs  02/12/15 2115 02/13/15 0421  WBC 7.3 9.0  HGB 11.4* 12.7  HCT 34.4* 38.2  MCV 85.9 87.0  PLT 267 256   Cardiac Enzymes:  Recent Labs  02/12/15 2115 02/13/15 0421 02/13/15 1014  TROPONINI <0.03 <0.03 <0.03   BNP: Invalid input(s): POCBNP D-Dimer: No results for input(s): DDIMER in the last 72 hours. Hemoglobin A1C:  Recent Labs  02/13/15 0421  HGBA1C 6.5*   Fasting Lipid Panel: No results for input(s): CHOL, HDL, LDLCALC, TRIG, CHOLHDL, LDLDIRECT in the last 72 hours. Thyroid Function Tests: No results for input(s): TSH, T4TOTAL, T3FREE, THYROIDAB in the last 72 hours.  Invalid input(s): FREET3 Anemia Panel: No results for input(s): VITAMINB12, FOLATE, FERRITIN, TIBC, IRON, RETICCTPCT in the last 72 hours.   PHYSICAL EXAM General: obese, in no acute distress HEENT:  Normocephalic and atramatic Neck:  No JVD.  Lungs: Clear bilaterally to auscultation and percussion. Heart: HRRR . Normal S1 and S2 without gallops or murmurs.  Abdomen: Bowel sounds are positive, abdomen  soft and non-tender  Msk:  Back normal, normal gait. Normal strength and tone for age. Extremities: No clubbing, cyanosis or edema.   Neuro: Alert and oriented X 3. Psych:  Good affect, responds appropriately  TELEMETRY: Reviewed telemetry pt in NSR  ASSESSMENT AND PLAN:  1. Atypical CP: troponin negative x3, echo shows normal LVEF and WM, EKG with no acute changes. Pt is ok to d/c from cards stand point, given appt for NST tomorrow at our office at 8:45am.    Patient and plan discussed with supervising provider, Dr. Neoma Laming, who agrees with above findings.   Kelby Fam Burt, [place]  02/14/2015 8:53 AM

## 2015-02-14 NOTE — Progress Notes (Signed)
Patient d/c'd home. Education provided, no questions at this time. Patient to be picked up by daughter. Telemetry removed. Katilynn Sinkler R [place]  

## 2015-04-24 DIAGNOSIS — Z6841 Body Mass Index (BMI) 40.0 and over, adult: Secondary | ICD-10-CM | POA: Diagnosis not present

## 2015-04-25 DIAGNOSIS — E113393 Type 2 diabetes mellitus with moderate nonproliferative diabetic retinopathy without macular edema, bilateral: Secondary | ICD-10-CM | POA: Diagnosis not present

## 2015-04-27 DIAGNOSIS — Z905 Acquired absence of kidney: Secondary | ICD-10-CM | POA: Diagnosis not present

## 2015-04-27 DIAGNOSIS — F410 Panic disorder [episodic paroxysmal anxiety] without agoraphobia: Secondary | ICD-10-CM | POA: Diagnosis not present

## 2015-04-27 DIAGNOSIS — R110 Nausea: Secondary | ICD-10-CM | POA: Diagnosis not present

## 2015-04-27 DIAGNOSIS — Z6841 Body Mass Index (BMI) 40.0 and over, adult: Secondary | ICD-10-CM | POA: Diagnosis not present

## 2015-04-27 DIAGNOSIS — F329 Major depressive disorder, single episode, unspecified: Secondary | ICD-10-CM | POA: Diagnosis not present

## 2015-04-27 DIAGNOSIS — Z7982 Long term (current) use of aspirin: Secondary | ICD-10-CM | POA: Diagnosis not present

## 2015-04-27 DIAGNOSIS — M1990 Unspecified osteoarthritis, unspecified site: Secondary | ICD-10-CM | POA: Diagnosis not present

## 2015-04-27 DIAGNOSIS — Z85528 Personal history of other malignant neoplasm of kidney: Secondary | ICD-10-CM | POA: Diagnosis not present

## 2015-04-27 DIAGNOSIS — Z79899 Other long term (current) drug therapy: Secondary | ICD-10-CM | POA: Diagnosis not present

## 2015-04-27 DIAGNOSIS — Z79891 Long term (current) use of opiate analgesic: Secondary | ICD-10-CM | POA: Diagnosis not present

## 2015-04-27 DIAGNOSIS — Z7951 Long term (current) use of inhaled steroids: Secondary | ICD-10-CM | POA: Diagnosis not present

## 2015-04-27 DIAGNOSIS — G4733 Obstructive sleep apnea (adult) (pediatric): Secondary | ICD-10-CM | POA: Diagnosis not present

## 2015-04-27 DIAGNOSIS — Z86718 Personal history of other venous thrombosis and embolism: Secondary | ICD-10-CM | POA: Diagnosis not present

## 2015-04-27 DIAGNOSIS — K219 Gastro-esophageal reflux disease without esophagitis: Secondary | ICD-10-CM | POA: Diagnosis not present

## 2015-04-27 DIAGNOSIS — G4700 Insomnia, unspecified: Secondary | ICD-10-CM | POA: Diagnosis not present

## 2015-04-27 DIAGNOSIS — I10 Essential (primary) hypertension: Secondary | ICD-10-CM | POA: Diagnosis not present

## 2015-04-27 DIAGNOSIS — Z8669 Personal history of other diseases of the nervous system and sense organs: Secondary | ICD-10-CM | POA: Diagnosis not present

## 2015-04-27 DIAGNOSIS — E119 Type 2 diabetes mellitus without complications: Secondary | ICD-10-CM | POA: Diagnosis not present

## 2015-04-27 DIAGNOSIS — E785 Hyperlipidemia, unspecified: Secondary | ICD-10-CM | POA: Diagnosis not present

## 2015-04-27 DIAGNOSIS — K660 Peritoneal adhesions (postprocedural) (postinfection): Secondary | ICD-10-CM | POA: Diagnosis not present

## 2015-04-27 DIAGNOSIS — Z7984 Long term (current) use of oral hypoglycemic drugs: Secondary | ICD-10-CM | POA: Diagnosis not present

## 2015-04-27 HISTORY — PX: ROUX-EN-Y GASTRIC BYPASS: SHX1104

## 2015-04-28 DIAGNOSIS — Z9884 Bariatric surgery status: Secondary | ICD-10-CM | POA: Diagnosis not present

## 2015-05-02 DIAGNOSIS — K760 Fatty (change of) liver, not elsewhere classified: Secondary | ICD-10-CM | POA: Diagnosis not present

## 2015-05-02 DIAGNOSIS — R112 Nausea with vomiting, unspecified: Secondary | ICD-10-CM | POA: Diagnosis not present

## 2015-05-02 DIAGNOSIS — R1110 Vomiting, unspecified: Secondary | ICD-10-CM | POA: Diagnosis not present

## 2015-05-02 DIAGNOSIS — Z9884 Bariatric surgery status: Secondary | ICD-10-CM | POA: Diagnosis not present

## 2015-05-03 DIAGNOSIS — Z9884 Bariatric surgery status: Secondary | ICD-10-CM | POA: Diagnosis not present

## 2015-05-03 DIAGNOSIS — R110 Nausea: Secondary | ICD-10-CM | POA: Diagnosis not present

## 2015-05-07 DIAGNOSIS — Z86718 Personal history of other venous thrombosis and embolism: Secondary | ICD-10-CM | POA: Diagnosis not present

## 2015-05-07 DIAGNOSIS — K219 Gastro-esophageal reflux disease without esophagitis: Secondary | ICD-10-CM | POA: Diagnosis not present

## 2015-05-07 DIAGNOSIS — Z85528 Personal history of other malignant neoplasm of kidney: Secondary | ICD-10-CM | POA: Diagnosis not present

## 2015-05-07 DIAGNOSIS — B351 Tinea unguium: Secondary | ICD-10-CM | POA: Diagnosis not present

## 2015-05-07 DIAGNOSIS — E785 Hyperlipidemia, unspecified: Secondary | ICD-10-CM | POA: Diagnosis not present

## 2015-05-07 DIAGNOSIS — F410 Panic disorder [episodic paroxysmal anxiety] without agoraphobia: Secondary | ICD-10-CM | POA: Diagnosis not present

## 2015-05-07 DIAGNOSIS — Z888 Allergy status to other drugs, medicaments and biological substances status: Secondary | ICD-10-CM | POA: Diagnosis not present

## 2015-05-07 DIAGNOSIS — K660 Peritoneal adhesions (postprocedural) (postinfection): Secondary | ICD-10-CM | POA: Diagnosis not present

## 2015-05-07 DIAGNOSIS — G4700 Insomnia, unspecified: Secondary | ICD-10-CM | POA: Diagnosis not present

## 2015-05-07 DIAGNOSIS — R112 Nausea with vomiting, unspecified: Secondary | ICD-10-CM | POA: Diagnosis not present

## 2015-05-07 DIAGNOSIS — M1990 Unspecified osteoarthritis, unspecified site: Secondary | ICD-10-CM | POA: Diagnosis not present

## 2015-05-07 DIAGNOSIS — Z9884 Bariatric surgery status: Secondary | ICD-10-CM | POA: Diagnosis not present

## 2015-05-07 DIAGNOSIS — K429 Umbilical hernia without obstruction or gangrene: Secondary | ICD-10-CM | POA: Diagnosis not present

## 2015-05-07 DIAGNOSIS — G4733 Obstructive sleep apnea (adult) (pediatric): Secondary | ICD-10-CM | POA: Diagnosis not present

## 2015-05-07 DIAGNOSIS — I517 Cardiomegaly: Secondary | ICD-10-CM | POA: Diagnosis not present

## 2015-05-07 DIAGNOSIS — Z6841 Body Mass Index (BMI) 40.0 and over, adult: Secondary | ICD-10-CM | POA: Diagnosis not present

## 2015-05-07 DIAGNOSIS — Y848 Other medical procedures as the cause of abnormal reaction of the patient, or of later complication, without mention of misadventure at the time of the procedure: Secondary | ICD-10-CM | POA: Diagnosis not present

## 2015-05-07 DIAGNOSIS — Z931 Gastrostomy status: Secondary | ICD-10-CM | POA: Diagnosis not present

## 2015-05-07 DIAGNOSIS — Z79891 Long term (current) use of opiate analgesic: Secondary | ICD-10-CM | POA: Diagnosis not present

## 2015-05-07 DIAGNOSIS — I10 Essential (primary) hypertension: Secondary | ICD-10-CM | POA: Diagnosis not present

## 2015-05-07 DIAGNOSIS — E119 Type 2 diabetes mellitus without complications: Secondary | ICD-10-CM | POA: Diagnosis not present

## 2015-05-07 DIAGNOSIS — Z7982 Long term (current) use of aspirin: Secondary | ICD-10-CM | POA: Diagnosis not present

## 2015-05-07 DIAGNOSIS — R1110 Vomiting, unspecified: Secondary | ICD-10-CM | POA: Diagnosis not present

## 2015-05-07 DIAGNOSIS — H539 Unspecified visual disturbance: Secondary | ICD-10-CM | POA: Diagnosis not present

## 2015-05-07 DIAGNOSIS — Z79899 Other long term (current) drug therapy: Secondary | ICD-10-CM | POA: Diagnosis not present

## 2015-05-07 DIAGNOSIS — F329 Major depressive disorder, single episode, unspecified: Secondary | ICD-10-CM | POA: Diagnosis not present

## 2015-05-07 DIAGNOSIS — Z7901 Long term (current) use of anticoagulants: Secondary | ICD-10-CM | POA: Diagnosis not present

## 2015-05-08 DIAGNOSIS — Z9884 Bariatric surgery status: Secondary | ICD-10-CM | POA: Diagnosis not present

## 2015-05-08 DIAGNOSIS — R1110 Vomiting, unspecified: Secondary | ICD-10-CM | POA: Diagnosis not present

## 2015-05-09 DIAGNOSIS — F3340 Major depressive disorder, recurrent, in remission, unspecified: Secondary | ICD-10-CM | POA: Diagnosis not present

## 2015-05-10 DIAGNOSIS — Z9884 Bariatric surgery status: Secondary | ICD-10-CM | POA: Diagnosis not present

## 2015-05-12 DIAGNOSIS — K565 Intestinal adhesions [bands] with obstruction (postprocedural) (postinfection): Secondary | ICD-10-CM | POA: Diagnosis not present

## 2015-05-12 DIAGNOSIS — K9589 Other complications of other bariatric procedure: Secondary | ICD-10-CM | POA: Diagnosis not present

## 2015-05-12 DIAGNOSIS — K660 Peritoneal adhesions (postprocedural) (postinfection): Secondary | ICD-10-CM | POA: Diagnosis not present

## 2015-05-22 DIAGNOSIS — Z9884 Bariatric surgery status: Secondary | ICD-10-CM | POA: Diagnosis not present

## 2015-05-26 DIAGNOSIS — K9589 Other complications of other bariatric procedure: Secondary | ICD-10-CM | POA: Diagnosis not present

## 2015-05-26 DIAGNOSIS — K210 Gastro-esophageal reflux disease with esophagitis: Secondary | ICD-10-CM | POA: Diagnosis not present

## 2015-05-26 DIAGNOSIS — G4733 Obstructive sleep apnea (adult) (pediatric): Secondary | ICD-10-CM | POA: Diagnosis not present

## 2015-05-26 DIAGNOSIS — Z888 Allergy status to other drugs, medicaments and biological substances status: Secondary | ICD-10-CM | POA: Diagnosis not present

## 2015-05-26 DIAGNOSIS — Z85528 Personal history of other malignant neoplasm of kidney: Secondary | ICD-10-CM | POA: Diagnosis not present

## 2015-05-26 DIAGNOSIS — E785 Hyperlipidemia, unspecified: Secondary | ICD-10-CM | POA: Diagnosis not present

## 2015-05-26 DIAGNOSIS — R339 Retention of urine, unspecified: Secondary | ICD-10-CM | POA: Diagnosis not present

## 2015-05-26 DIAGNOSIS — I10 Essential (primary) hypertension: Secondary | ICD-10-CM | POA: Diagnosis not present

## 2015-05-26 DIAGNOSIS — K259 Gastric ulcer, unspecified as acute or chronic, without hemorrhage or perforation: Secondary | ICD-10-CM | POA: Diagnosis not present

## 2015-05-26 DIAGNOSIS — Z6841 Body Mass Index (BMI) 40.0 and over, adult: Secondary | ICD-10-CM | POA: Diagnosis not present

## 2015-05-26 DIAGNOSIS — Y832 Surgical operation with anastomosis, bypass or graft as the cause of abnormal reaction of the patient, or of later complication, without mention of misadventure at the time of the procedure: Secondary | ICD-10-CM | POA: Diagnosis not present

## 2015-05-26 DIAGNOSIS — Z9884 Bariatric surgery status: Secondary | ICD-10-CM | POA: Diagnosis not present

## 2015-05-26 DIAGNOSIS — K222 Esophageal obstruction: Secondary | ICD-10-CM | POA: Diagnosis not present

## 2015-05-26 DIAGNOSIS — Z905 Acquired absence of kidney: Secondary | ICD-10-CM | POA: Diagnosis not present

## 2015-05-26 DIAGNOSIS — R109 Unspecified abdominal pain: Secondary | ICD-10-CM | POA: Diagnosis not present

## 2015-05-26 DIAGNOSIS — Z86718 Personal history of other venous thrombosis and embolism: Secondary | ICD-10-CM | POA: Diagnosis not present

## 2015-05-27 DIAGNOSIS — R229 Localized swelling, mass and lump, unspecified: Secondary | ICD-10-CM | POA: Diagnosis not present

## 2015-05-27 DIAGNOSIS — R109 Unspecified abdominal pain: Secondary | ICD-10-CM | POA: Diagnosis not present

## 2015-05-29 DIAGNOSIS — M79604 Pain in right leg: Secondary | ICD-10-CM | POA: Diagnosis not present

## 2015-05-29 DIAGNOSIS — M79605 Pain in left leg: Secondary | ICD-10-CM | POA: Diagnosis not present

## 2015-05-30 DIAGNOSIS — Z9884 Bariatric surgery status: Secondary | ICD-10-CM | POA: Diagnosis not present

## 2015-05-30 DIAGNOSIS — K289 Gastrojejunal ulcer, unspecified as acute or chronic, without hemorrhage or perforation: Secondary | ICD-10-CM | POA: Diagnosis not present

## 2015-05-30 DIAGNOSIS — K3189 Other diseases of stomach and duodenum: Secondary | ICD-10-CM | POA: Diagnosis not present

## 2015-05-30 DIAGNOSIS — R109 Unspecified abdominal pain: Secondary | ICD-10-CM | POA: Diagnosis not present

## 2015-05-30 DIAGNOSIS — K208 Other esophagitis: Secondary | ICD-10-CM | POA: Diagnosis not present

## 2015-05-31 DIAGNOSIS — K289 Gastrojejunal ulcer, unspecified as acute or chronic, without hemorrhage or perforation: Secondary | ICD-10-CM | POA: Insufficient documentation

## 2015-05-31 DIAGNOSIS — R42 Dizziness and giddiness: Secondary | ICD-10-CM | POA: Diagnosis not present

## 2015-05-31 DIAGNOSIS — M2550 Pain in unspecified joint: Secondary | ICD-10-CM | POA: Diagnosis not present

## 2015-06-06 ENCOUNTER — Other Ambulatory Visit: Payer: Self-pay | Admitting: *Deleted

## 2015-06-06 DIAGNOSIS — R509 Fever, unspecified: Secondary | ICD-10-CM | POA: Diagnosis not present

## 2015-06-06 DIAGNOSIS — E1169 Type 2 diabetes mellitus with other specified complication: Secondary | ICD-10-CM

## 2015-06-06 NOTE — Patient Outreach (Addendum)
[facility]) Care Management  06/06/2015  Denise Davis 11-04-1965 [id number]   Subjective: Telephone call to patient's home number, spoke with patient, and HIPAA verified.  Discussed [facility] Care Management services and patient in agreement to receive services.   Patient has given RNCM verbal authorization to speak with her husband Carmalita Micheletti, son Bryla Shellito, and daughter Sahirah Chaumont regarding patient's healthcare needs as needed.  Patient states is currently on disability, has [employer] insurance and does not have [place] Medicaid.   Patient states she has an appointment with her primary MD Dr. Delight Stare on 06/06/15.   Patient states she is doing better but has had a lot of complications since her gastric bypass surgery on 05/28/15.   States she has had several hospitalizations since surgery and ED visits.   Patient was hospitalized at [facility]. Patient states she has also had several falls since she came home from the hospital on  05/31/15.    States she has a history of falls of unknown origin and has had injuries related to falls.   States she has passed out and hit her head due to a fall in the past.  Patient states she ambulates with a rolling walker to increase balance.    Patient states she is need of an assistive device for the tub.   States she is not currently receiving home health.   Patient states she has a history of diabetes and hypertension.   Patient is in agreement with a referral to [facility] for transition of care due to recent hospitalization, home safety evaluation due to fall history, depression screening follow up, coordination of care for tub bench, community resources, and disease education. Patient states she has been diagnosed with depression,  is currently taking medications and has seen counselor in the past.   States she is in the process of rescheduling counseling appointment due scheduling conflict while she was in  the hospital.  Patient in agreement to referral to [place] [facility] for depression screening follow up, community resources, education and is in agreement with [facility] sharing depression screening results with primary MD as needed.  Patient is taking several medications and is in agreement with referral to [place] for medication review, medication assistance and education.   Patient states some of her medications are difficult to afford.   Patient provided with RNCM's contact number, [facility] main number contact, and contact number for 24 hour Nurse Advice line.   Patient in agreement to continue to receive [place] Management services.  Objective:  Per Epic case review: Patient hospitalized at [facility])  on 02/12/15 for chest pain.   Assessment:  Received [place] referral on 06/05/15 from [employer].   Referral reason: Patient has had 3 admits in the last monthr related to complications from gastric bypass surgery.   Patient currently has open wounds that are being dressed by patient twice daily and a blistered area where a PICC line was removed.   Patient has fallen 3 times in the last 2 weeks.  PHQ 9 score of  17, normally sees psych, but has been unable to since she keeps going back into the hospital.   Plan:  RNCM will refer patient to [place] [facility] for depression screening follow up, community resources, education and patient is in agreement with [facility] sharing depression screening results with primary MD as needed. RNCM will refer patient to [facility] for transition of  care due to recent hospitalization, home safety evaluation due to fall history, depression screening follow up, coordination of care for tub bench, community resources, and disease education. RNCM will refer patient to [place] for medication review, medication assistance and education.  Nykia Turko H. Annia Friendly, BSN, [place] [facility] Telephonic CM Phone: [phone] Fax:  [phone]

## 2015-06-07 ENCOUNTER — Other Ambulatory Visit: Payer: Self-pay | Admitting: *Deleted

## 2015-06-07 NOTE — Patient Outreach (Signed)
Transition of care call (referral received 3/7- pt discharged 2/26).  Spoke with pt, HIPPA verified.   Pt reports sleeping a lot, feeling a lot better since discharge.  Pt reports f/u with Dr. Lennox Grumbles yesterday, had a low grade fever to which MD had blood test done, wound checked/rebandaged.   Pt reports she received a call today from MD office, was told everything was good.  Pt reports she changes her dressing daily, no s/s of infection.   Pt reports she does have pain in arm where PICC line was, MD looked at it, no s/s of infection.  Pt reports to f/u with surgeon Dr. Margart Sickles 3/20, had surgery done at [facility].  Pt reports she went over her discharge medications with Dr. Lennox Grumbles.   Pt reports sugars are good, has not had  Diabetic medication since 2/26, last night sugar was 100.  Discussed with pt transition of care program, plan to  f/u with weekly calls for 30 days/home visit included to which pt agreed.   Plan to f/u with pt 3/17- home visit.   Plan to inform Dr. Lennox Grumbles of [facility] involvement - fax Barrier letter in [place].    Zara Chess.   [place] Care Management  [phone]

## 2015-06-11 DIAGNOSIS — Y838 Other surgical procedures as the cause of abnormal reaction of the patient, or of later complication, without mention of misadventure at the time of the procedure: Secondary | ICD-10-CM | POA: Diagnosis not present

## 2015-06-11 DIAGNOSIS — Z905 Acquired absence of kidney: Secondary | ICD-10-CM | POA: Diagnosis not present

## 2015-06-11 DIAGNOSIS — M1990 Unspecified osteoarthritis, unspecified site: Secondary | ICD-10-CM | POA: Diagnosis not present

## 2015-06-11 DIAGNOSIS — G4733 Obstructive sleep apnea (adult) (pediatric): Secondary | ICD-10-CM | POA: Diagnosis not present

## 2015-06-11 DIAGNOSIS — Z931 Gastrostomy status: Secondary | ICD-10-CM | POA: Diagnosis not present

## 2015-06-11 DIAGNOSIS — I4581 Long QT syndrome: Secondary | ICD-10-CM | POA: Diagnosis not present

## 2015-06-11 DIAGNOSIS — Z9889 Other specified postprocedural states: Secondary | ICD-10-CM | POA: Diagnosis not present

## 2015-06-11 DIAGNOSIS — Z6841 Body Mass Index (BMI) 40.0 and over, adult: Secondary | ICD-10-CM | POA: Diagnosis not present

## 2015-06-11 DIAGNOSIS — R112 Nausea with vomiting, unspecified: Secondary | ICD-10-CM | POA: Diagnosis not present

## 2015-06-11 DIAGNOSIS — E119 Type 2 diabetes mellitus without complications: Secondary | ICD-10-CM | POA: Diagnosis not present

## 2015-06-11 DIAGNOSIS — K219 Gastro-esophageal reflux disease without esophagitis: Secondary | ICD-10-CM | POA: Diagnosis not present

## 2015-06-11 DIAGNOSIS — Z7982 Long term (current) use of aspirin: Secondary | ICD-10-CM | POA: Diagnosis not present

## 2015-06-11 DIAGNOSIS — Z86718 Personal history of other venous thrombosis and embolism: Secondary | ICD-10-CM | POA: Diagnosis not present

## 2015-06-11 DIAGNOSIS — R9431 Abnormal electrocardiogram [ECG] [EKG]: Secondary | ICD-10-CM | POA: Diagnosis not present

## 2015-06-11 DIAGNOSIS — Z85528 Personal history of other malignant neoplasm of kidney: Secondary | ICD-10-CM | POA: Diagnosis not present

## 2015-06-11 DIAGNOSIS — I10 Essential (primary) hypertension: Secondary | ICD-10-CM | POA: Diagnosis not present

## 2015-06-11 DIAGNOSIS — K9589 Other complications of other bariatric procedure: Secondary | ICD-10-CM | POA: Diagnosis not present

## 2015-06-11 DIAGNOSIS — Z888 Allergy status to other drugs, medicaments and biological substances status: Secondary | ICD-10-CM | POA: Diagnosis not present

## 2015-06-11 DIAGNOSIS — E785 Hyperlipidemia, unspecified: Secondary | ICD-10-CM | POA: Diagnosis not present

## 2015-06-11 DIAGNOSIS — R1013 Epigastric pain: Secondary | ICD-10-CM | POA: Diagnosis not present

## 2015-06-12 ENCOUNTER — Other Ambulatory Visit: Payer: Self-pay | Admitting: Pharmacist

## 2015-06-12 DIAGNOSIS — K9589 Other complications of other bariatric procedure: Secondary | ICD-10-CM | POA: Diagnosis not present

## 2015-06-12 DIAGNOSIS — K228 Other specified diseases of esophagus: Secondary | ICD-10-CM | POA: Diagnosis not present

## 2015-06-12 DIAGNOSIS — K311 Adult hypertrophic pyloric stenosis: Secondary | ICD-10-CM | POA: Diagnosis not present

## 2015-06-12 DIAGNOSIS — K209 Esophagitis, unspecified: Secondary | ICD-10-CM | POA: Diagnosis not present

## 2015-06-12 DIAGNOSIS — K9189 Other postprocedural complications and disorders of digestive system: Secondary | ICD-10-CM | POA: Diagnosis not present

## 2015-06-12 NOTE — Patient Outreach (Signed)
Denise Davis was referred to pharmacy for medication review, assistance and education. Left a HIPAA compliant message on the patient's voicemail. If have not heard from patient within a week, will give her another call at that time.  Harlow Asa, PharmD Clinical Pharmacist [place] Management [phone]

## 2015-06-14 ENCOUNTER — Other Ambulatory Visit: Payer: Self-pay | Admitting: Pharmacist

## 2015-06-14 NOTE — Addendum Note (Signed)
Addended by: Harlow Asa A on: 06/14/2015 10:05 PM   Modules accepted: Orders, Medications

## 2015-06-14 NOTE — Patient Outreach (Addendum)
Receive a call back from [facility] in Dr. Laroy Apple office regarding my phone call this morning about:  1) concerns about risk of development of extrapyramidal reactions in this patient currently taking metoclopramide, promethazine and Seroquel and patient's report of symptoms of "energy surge" and then numbness around her mouth, as well as jaw clenching, over the past 2 weeks.   2) concern about QTc prolongation risk of using Seroquel, Zofran, mirtazapine, sertraline, albuterol, and promethazine together/recent EKG results.  Lattie Haw states that per Dr. Margart Sickles, patient is to discontinue taking Reglan for now due to this interaction risk (will update [facility] EPIC medication list accordingly). Lattie Haw states that per Dr. Margart Sickles, he is not currently concerned about the QTc prolongation risk/EKG results. Lattie Haw states that she will call the patient now to provide her with the instruction to discontinue the Reglan and that she will follow the patient closely over the next couple of weeks up to the patient's next appointment on 06/26/15.  Will close this pharmacy episode now and send an update to Nurse Care Manager [place], [place] Management [phone]

## 2015-06-14 NOTE — Patient Outreach (Signed)
Call to speak with Dr. Margart Sickles at [facility]. Speak with Dr. Laroy Apple nurse, Lattie Haw.  Discuss with Lattie Haw concerns about risk of development of extrapyramidal reactions or neuroleptic malignant syndrome in this patient currently taking metoclopramide, promethazine and Seroquel. Let Lattie Haw know that patient is reporting that she started having some symptoms around her mouth about 2 weeks ago. Patient describes this feeling as an "energy surge" and then numbness around her mouth. Reports that she has also been having on and off jaw clenching since that time. Lattie Haw notes that per the discharge summary from 06/13/15, metoclopramide was not listed as a medication for the patient to continue. Note that it was listed as a medication that she was receiving while inpatient. Let Lattie Haw know that the patient reports currently taking the medication.  Next discuss with Lattie Haw concern about QTc prolongation risk of using Seroquel, Zofran, mirtazapine, sertraline, albuterol, and promethazine together. Let Lattie Haw know that per the [place] record, patient had an ECG performed on 06/11/15, which showed an "EKG QTC Calculation" of 480 ms and, under the narrative was written to show a "prolonged QT". Note next most recent EKG in EPIC record within [facility] system from 02/16/15, showed a QTc of 449 ms.  Lattie Haw states that she will follow up with Dr. Margart Sickles about each of these issues and that she will then follow up with both the patient and myself today.  Harlow Asa, PharmD Clinical Pharmacist [place] Management [phone]

## 2015-06-14 NOTE — Patient Outreach (Signed)
[facility] [facility]) Care Management  [place]   06/14/2015  Denise Davis Jun 20, 1965 [id number]  Subjective: Denise Davis is a 50 y.o. female referred to pharmacy for medication review, assistance and education. Reviewed patient's allergy, medication and problem list in order to perform this evaluation. Patient reports that she is not having any issues with medication affordability at this time. Review with patient each of her medications, including name, dose, direction and administration. Update EPIC medication list record accordingly.  Note patient discharged from [facility] yesterday following admission 3/12-3/14 for nausea and vomiting following gastric bypass surgery. Patient reports that she is currently taking Zofran, metoclopramide and promethazine in order to control her nausea and vomiting symptoms. Discuss with patient concern that taking metoclopramide and promethazine in combination can put patient at increased risk of adverse events, including some movement disorders. Note that patient also takes Seroquel daily, which can also contribute to this risk. Patient does report that she started having some symptoms around her mouth about 2 weeks ago. Patient describes this feeling as an "energy surge" and then numbness around her mouth. Reports that she has also been having on and off jaw clenching since that time. Patient reports that her physician, Dr. Margart Sickles, is aware that she is taking each of these medications. However, note that per [name], metoclopramide was not listed as a medication for the patient to continue based on her discharge summary from this 06/13/15 discharge. Note that it was listed as a medication that she was receiving while inpatient. Let patient know that I will call to follow up with her discharging physician, Dr. Cammie Sickle, today. Patient provides me with the physician's phone number, [phone].  Patient reports  that she needs her oxycodone about twice a day. Reports that this medication does make her drowsy and that she stays in bed most of the day. Reports that she is very careful when she does get up as she has fallen twice in the past few weeks. Reports that due to her falls, she now has a walker. Counsel patient about the sedation/increased fall risk with several of her medications, such as oxycodone, gabapentin, Keppra, mirtazapine, promethazine and Seroquel. Counsel patient to be cautious throughout the day, keeping her walker with her at all times (next to her bed and then next to her chair). Patient verbalizes understanding. Patient also reports that she has family members with her each morning to help her. Patient reports that she has had trouble with constipation, but that this is currently controlled with the use of Miralax.  Patient reports that she does have some dry mouth. Counsel patient about ways to treat dry mouth including by chewing gum and using artificial saliva products. Counsel patient about the particular importance of keeping her mouth hydrated in the case of using the Nitrostat sublingual tablets, as the saliva is necessary for this medication to dissolve. Patient verbalizes understanding.  Reports that she has been taking tamsulosin daily as she had a bladder obstruction during a past hospitalization, but that the obstruction did resolve. Note that per the discharge note in [place] record from her 06/13/15 discharge, patient was to stop the tamsulosin. Patient locates her discharge paperwork and finds this same instruction. Patient reports that she will stop taking this medication now.   Patient reports that she is not currently taking either Byetta or metformin. Reports that she was told to hold these medications following bypass surgery until follow up appointment.  Reports that she is having this appointment with Dr. Margart Sickles on 06/26/15.  Patient reports that she has no  further medication questions/concerns for me at this time.  Objective:   Current Medications: Current Outpatient Prescriptions  Medication Sig Dispense Refill  . albuterol (PROVENTIL HFA) 108 (90 BASE) MCG/ACT inhaler Inhale 2 puffs into the lungs every 4 (four) hours as needed for wheezing or shortness of breath. 1 Inhaler 0  . amLODipine (NORVASC) 10 MG tablet Take 1 tablet (10 mg total) by mouth daily. 30 tablet 6  . aspirin EC 81 MG tablet Take 81 mg by mouth daily.    . calcium-vitamin D 250-100 MG-UNIT tablet Take 1 tablet by mouth 2 (two) times daily.    . carvedilol (COREG) 12.5 MG tablet Take 25 mg by mouth 2 (two) times daily with a meal.     . Cholecalciferol (VITAMIN D-3) 1000 UNITS CAPS Take 1 capsule by mouth daily.    . ferrous sulfate 325 (65 FE) MG tablet Take 325 mg by mouth daily with breakfast.    . hydrochlorothiazide (HYDRODIURIL) 25 MG tablet Take 25 mg by mouth daily.    Marland Kitchen levETIRAcetam (KEPPRA) 750 MG tablet Take 1,500 mg by mouth 2 (two) times daily.    Marland Kitchen loratadine (CLARITIN) 10 MG tablet Take 10 mg by mouth at bedtime.    . metoCLOPramide (REGLAN) 10 MG tablet Take 10 mg by mouth 3 (three) times daily before meals.    . mirtazapine (REMERON) 15 MG tablet Take 15 mg by mouth at bedtime.    . Multiple Vitamins-Minerals (MULTIVITAMIN WITH MINERALS) tablet Take 1 tablet by mouth daily.    . nitroGLYCERIN (NITROSTAT) 0.4 MG SL tablet Place 0.4 mg under the tongue every 5 (five) minutes as needed for chest pain. (may repeat every 5 minutes but seek medical help if pain persists after 3 tablets)    . omeprazole (PRILOSEC) 40 MG capsule Take 40 mg by mouth daily.    . ondansetron (ZOFRAN-ODT) 4 MG disintegrating tablet Take 4 mg by mouth every 8 (eight) hours as needed for nausea or vomiting.    Marland Kitchen oxyCODONE (OXY IR/ROXICODONE) 5 MG immediate release tablet Take 5 mg by mouth every 4 (four) hours as needed for severe pain.    . polyethylene glycol (MIRALAX / GLYCOLAX)  packet Take 17 g by mouth 2 (two) times daily.    . promethazine (PHENERGAN) 25 MG tablet Take 25 mg by mouth.    . QUEtiapine (SEROQUEL) 100 MG tablet Take 100 mg by mouth at bedtime.    . sertraline (ZOLOFT) 100 MG tablet Take 200 mg by mouth at bedtime.     . ursodiol (ACTIGALL) 300 MG capsule Take 300 mg by mouth 2 (two) times daily.    . vitamin B-12 (CYANOCOBALAMIN) 500 MCG tablet Take 500 mcg by mouth daily.    . vitamin E 400 UNIT capsule Take 400 Units by mouth daily.    Marland Kitchen gabapentin (NEURONTIN) 300 MG capsule Take 1,800 mg by mouth at bedtime.    Marland Kitchen HYDROcodone-acetaminophen (NORCO/VICODIN) 5-325 MG tablet Take 1 tablet by mouth every 4 (four) hours as needed for moderate pain. (Patient not taking: Reported on 06/14/2015) 20 tablet 0  . ibuprofen (ADVIL,MOTRIN) 600 MG tablet Take 1 tablet by mouth three times daily with meals (Patient not taking: Reported on 06/14/2015) 15 tablet 0  . Thiamine HCl (VITAMIN B-1) 250 MG tablet Take 250 mg by mouth daily. Reported on 06/14/2015     No  current facility-administered medications for this visit.    Assessment:  Drugs sorted by system:  Neurologic/Psychologic: Keppra, mirtazapine, Seroquel, sertraline  Cardiovascular: amlodipine, aspirin, carvedilol, hydrochlorothiazide, Nitrostat  Pulmonary/Allergy: albuterol inhaler, loratadine  Gastrointestinal: metoclopramide, omeprazole, Zofran, Miralax, promethazine, ursodiol  Pain: gabapentin, oxycodone  Vitamins/Minerals: Vitamin D, calcium citrate, ferrous sulfate, multivitamin, thiamine, Vitamin B12, Vitamin E   Duplications in therapy: none noted  Gaps in therapy: none noted  Drug interactions:   Metoclopramide + promethazine + Seroquel: Avoid use of metoclopramide in combination with other agents associated with development of extrapyramidal reactions (e.g. tardive dyskinesia) or neuroleptic malignant syndrome. Provider contacted   QTc-Prolonging Agents, such as Zofran, mirtazapine,  sertraline, albuterol (PRN), promethazine (PRN)  may enhance the QTc-prolonging effect of Highest Risk QTc-Prolonging Agents, such as Seroquel. Per [place] record, patient had an ECG performed on 06/11/15, which showed an "EKG QTC Calculation" of 480 ms and, under the narrative was written to show a "prolonged QT". Note next most recent EKG in EPIC record within [facility] system from 02/16/15, showed a QTc of 449 ms. Provider contacted   Nitrostat + loratadine/promethazine/Seroquel: anticholinergic agents may decrease the dissolution of sublingual nitroglycerin tablets, possibly impairing or slowing nitroglycerin absorption. Patient counseled and advised of ways to increase saliva production.   Oxycodone + gabapentin + Keppra + mirtazapine + promethazine + Seroquel: CNS Depressants may enhance the CNS depressant effect of other CNS depressants. Patient counseled on risk and to use walker at all times.   Metoclopramide may enhance the adverse/toxic effect of Selective Serotonin Reuptake Inhibitors, such as sertraline. Provider contacted.   Plan:  1) Will call Dr. Margart Sickles today regarding concerns about risk of development of extrapyramidal reactions or neuroleptic malignant syndrome in this patient currently taking metoclopramide, promethazine and Seroquel. Will let the provider know about patient's report of symptoms of "energy surge" and then numbness around her mouth, as well as jaw clenching, over the past 2 weeks.   2) Will also discuss with Dr. Margart Sickles today concern about QTc prolongation risk of using Seroquel, Zofran, mirtazapine, sertraline, albuterol, and promethazine together/recent EKG results.  3) Patient to stop taking tamsulosin, per hospital discharge summary from 06/13/15.  4) Patient to follow up with Dr. Margart Sickles at visit on 06/26/15.  Harlow Asa, PharmD Clinical Pharmacist [place] Management [phone]

## 2015-06-16 ENCOUNTER — Other Ambulatory Visit: Payer: Self-pay | Admitting: *Deleted

## 2015-06-16 ENCOUNTER — Encounter: Payer: Self-pay | Admitting: *Deleted

## 2015-06-16 NOTE — Patient Outreach (Signed)
[facility]) Care Management  06/16/2015  Denise Davis 06/26/1965 [id number]   Patient referred to this social worker to address issues related to depression. Phone call made to patient, home visit scheduled for 06/22/15 at 12:00pm   Sheralyn Boatman [facility] Care Management [phone]

## 2015-06-16 NOTE — Patient Outreach (Signed)
Called pt - recent hospital discharge (3/14), scheduled home visit for today.   RN CM discussed with pt  Scheduled home visit for today with recent hospital discharge to which pt agreed to go ahead with visit.   Plan to f/u with pt today- scheduled home visit.   Zara Chess.   [place] Care Management  [phone]

## 2015-06-19 ENCOUNTER — Other Ambulatory Visit: Payer: Self-pay | Admitting: *Deleted

## 2015-06-19 NOTE — Patient Outreach (Signed)
[facility] [place] [facility]) Care Management Transition of Care telephone outreach, week 2 06/19/2015  Denise Davis 10/11/1965 [id number]  Successful telephone outreach to 50 year old female followed by [facility] for Transition of Care after discharge on June 13, 2015.  Patient had gastric-bypass done at [facility].  Patient had called [facility] office asking for callback regarding conflict she had with upcoming [facility] LCSW home visit.  This afternoon, patient reported that she will be having an upper endoscopy on Thursday March 23, and wishes to re-schedule appointment with [facility].  I let patient know that [name] had been copied on the message I received, and that [name] would return the patient's call to re- schedule at a later time.  Patient stated that "things are about the same," after her [place] initial visit with Kathie Rhodes, [facility], which was on Friday, June 16, 2015.  Patient denied new needs and stated that she "holding steady."  Plan: I will call this patient next week for continued transition of care follow up, week 3, and re-schedule next [place] home visit at that time.  [name], RN, BSN, [employer] [facility] Care Management  [phone]

## 2015-06-19 NOTE — Patient Outreach (Addendum)
[facility]) Care Management   Home visit done 06/16/15  Denise Davis 12/04/1965 [id number]  Denise Davis is an 50 y.o. female  Subjective:  Pt reports on recent hospitalization (nausea/vomiting)- recent bypass surgery done.  Pt reports not on any special diet, told to eat what she wants.  Pt reports does protein premier shakes through her G tube 2-3 times a day as well as water to stay hydrated.   Pt reports severe appetite loss, eats because she knows she has to, takes anti nausea medication as needed.   Objective:   Filed Vitals:   06/16/15 1039  BP: 110/80  Pulse: 105  Resp: 24    ROS  Physical Exam  Current Medications:  Reviewed with pt  Current Outpatient Prescriptions  Medication Sig Dispense Refill  . albuterol (PROVENTIL HFA) 108 (90 BASE) MCG/ACT inhaler Inhale 2 puffs into the lungs every 4 (four) hours as needed for wheezing or shortness of breath. 1 Inhaler 0  . amLODipine (NORVASC) 10 MG tablet Take 1 tablet (10 mg total) by mouth daily. 30 tablet 6  . aspirin EC 81 MG tablet Take 81 mg by mouth daily.    . carvedilol (COREG) 12.5 MG tablet Take 25 mg by mouth 2 (two) times daily with a meal.     . gabapentin (NEURONTIN) 300 MG capsule Take 1,800 mg by mouth at bedtime.    . hydrochlorothiazide (HYDRODIURIL) 25 MG tablet Take 25 mg by mouth daily.    Marland Kitchen levETIRAcetam (KEPPRA) 750 MG tablet Take 1,500 mg by mouth 2 (two) times daily.    Marland Kitchen loratadine (CLARITIN) 10 MG tablet Take 10 mg by mouth at bedtime.    . mirtazapine (REMERON) 15 MG tablet Take 15 mg by mouth at bedtime.    . nitroGLYCERIN (NITROSTAT) 0.4 MG SL tablet Place 0.4 mg under the tongue every 5 (five) minutes as needed for chest pain. (may repeat every 5 minutes but seek medical help if pain persists after 3 tablets)    . omeprazole (PRILOSEC) 40 MG capsule Take 40 mg by mouth daily.    . ondansetron (ZOFRAN-ODT) 4 MG disintegrating tablet Take 4 mg by mouth every 8 (eight) hours  as needed for nausea or vomiting.    Marland Kitchen oxyCODONE (OXY IR/ROXICODONE) 5 MG immediate release tablet Take 5 mg by mouth every 4 (four) hours as needed for severe pain.    . promethazine (PHENERGAN) 25 MG tablet Take 25 mg by mouth.    . QUEtiapine (SEROQUEL) 100 MG tablet Take 100 mg by mouth at bedtime.    . sertraline (ZOLOFT) 100 MG tablet Take 200 mg by mouth at bedtime.     . ursodiol (ACTIGALL) 300 MG capsule Take 300 mg by mouth 2 (two) times daily.    . calcium-vitamin D 250-100 MG-UNIT tablet Take 1 tablet by mouth 2 (two) times daily. Reported on 06/16/2015    . Cholecalciferol (VITAMIN D-3) 1000 UNITS CAPS Take 1 capsule by mouth daily. Reported on 06/16/2015    . ferrous sulfate 325 (65 FE) MG tablet Take 325 mg by mouth daily with breakfast. Reported on 06/16/2015    . HYDROcodone-acetaminophen (NORCO/VICODIN) 5-325 MG tablet Take 1 tablet by mouth every 4 (four) hours as needed for moderate pain. (Patient not taking: Reported on 06/14/2015) 20 tablet 0  . ibuprofen (ADVIL,MOTRIN) 600 MG tablet Take 1 tablet by mouth three times daily with meals (Patient not taking: Reported on 06/14/2015) 15 tablet 0  . Multiple  Vitamins-Minerals (MULTIVITAMIN WITH MINERALS) tablet Take 1 tablet by mouth daily. Reported on 06/16/2015    . polyethylene glycol (MIRALAX / GLYCOLAX) packet Take 17 g by mouth 2 (two) times daily. Reported on 06/16/2015    . Thiamine HCl (VITAMIN B-1) 250 MG tablet Take 250 mg by mouth daily. Reported on 06/16/2015    . vitamin B-12 (CYANOCOBALAMIN) 500 MCG tablet Take 500 mcg by mouth daily. Reported on 06/16/2015    . vitamin E 400 UNIT capsule Take 400 Units by mouth daily. Reported on 06/16/2015     No current facility-administered medications for this visit.    Functional Status:   In your present state of health, do you have any difficulty performing the following activities: 06/16/2015 02/13/2015  Hearing? N N  Vision? N N  Difficulty concentrating or making decisions? N N   Walking or climbing stairs? N Y  Dressing or bathing? N N  Doing errands, shopping? Denise Davis  Preparing Food and eating ? Y -  Using the Toilet? N -  In the past six months, have you accidently leaked urine? Y -  Do you have problems with loss of bowel control? N -  Managing your Medications? N -  Managing your Finances? Y -  Housekeeping or managing your Housekeeping? Y -    Fall/Depression Screening:    PHQ 2/9 Scores 06/16/2015 06/06/2015  PHQ - 2 Score 2 4  PHQ- 9 Score - 18    Assessment: Pt lives with spouse and daughter, very supportive.  Nutrition-  As reported  getting most of her nutrition through protein shakes via G tube.  Discussed adding greek yogurt to protein shakes.                          DM- sugar today was 200, taken shortly after eating.  Pt not been checking sugars, diabetic medication on hold until f/u with Dr. Margart Sickles 3/27.                         CPAP supplies- pt currently not using CPAP machine due to surgery.  As discussed, pt to f/u with [facility] about supplies (with  [employer] insurance switched to [place] care).   Plan:  Pt to f/u with Dr. Margart Sickles 3/27.             Pt to f/u with [facility] about her CPAP supplies, have available when able to use CPAP again.            Impaired nutrition- pt to increase protein in diet, have 4 small meals.              DM- pt start checking sugars [facility] calendar provided), record, call MD if elevated (pt currently not on her diabetic medication)            As discussed with pt, will  continue to be followed for Transition of care, this RN CM to provide report to              [facility] covering pt's area (remaining transition of care calls).            Plan to fax 3/17  encounter to  Dr. Lennox Grumbles- route in [place].   THN CM Care Plan Problem One        Most Recent Value   Care Plan Problem One  Risk for readmission -  recent bypass surgery    Role Documenting the Problem One  Care Management  Coordinator   Care Plan for Problem One  Active   THN Long Term Goal (31-90 days)  Pt would not readmit 31 days post day of discharge    THN Long Term Goal Start Date  06/07/15   Interventions for Problem One Long Term Goal  Home visit done 06/16/15   THN CM Short Term Goal #1 (0-30 days)  Pain in PICC Line site would improve in the next 10-14 days    THN CM Short Term Goal #1 Start Date  06/07/15   Interventions for Short Term Goal #1  Reviewed with pt s/s of infection to look for.    THN CM Short Term Goal #2 (0-30 days)  Pt would have no complication to surgical wound in the next 30 days    THN CM Short Term Goal #2 Start Date  06/07/15   Interventions for Short Term Goal #2  Reviewed with pt s/s of infection to look for, continue to change dressing daily     [facility] CM Care Plan Problem Two        Most Recent Value   Care Plan Problem Two  Impaired nutrition related to recent surgery    Role Documenting the Problem Two  Care Management Coordinator   Care Plan for Problem Two  Active   THN CM Short Term Goal #1 (0-30 days)  Pt's nutritional status would improve in the next 14 days    THN CM Short Term Goal #1 Start Date  06/16/15   Interventions for Short Term Goal #2   Discussed with pt adding greek yogurt to protein shakes, 4 small frequent meals, snacks      Aydrien Froman M.   [place] Care Management  [phone]

## 2015-06-20 DIAGNOSIS — Z888 Allergy status to other drugs, medicaments and biological substances status: Secondary | ICD-10-CM | POA: Diagnosis not present

## 2015-06-20 DIAGNOSIS — F410 Panic disorder [episodic paroxysmal anxiety] without agoraphobia: Secondary | ICD-10-CM | POA: Diagnosis not present

## 2015-06-20 DIAGNOSIS — Z7951 Long term (current) use of inhaled steroids: Secondary | ICD-10-CM | POA: Diagnosis not present

## 2015-06-20 DIAGNOSIS — F329 Major depressive disorder, single episode, unspecified: Secondary | ICD-10-CM | POA: Diagnosis not present

## 2015-06-20 DIAGNOSIS — G4733 Obstructive sleep apnea (adult) (pediatric): Secondary | ICD-10-CM | POA: Diagnosis not present

## 2015-06-20 DIAGNOSIS — K219 Gastro-esophageal reflux disease without esophagitis: Secondary | ICD-10-CM | POA: Diagnosis not present

## 2015-06-20 DIAGNOSIS — K311 Adult hypertrophic pyloric stenosis: Secondary | ICD-10-CM | POA: Diagnosis not present

## 2015-06-20 DIAGNOSIS — K9589 Other complications of other bariatric procedure: Secondary | ICD-10-CM | POA: Diagnosis not present

## 2015-06-20 DIAGNOSIS — M1990 Unspecified osteoarthritis, unspecified site: Secondary | ICD-10-CM | POA: Diagnosis not present

## 2015-06-20 DIAGNOSIS — E119 Type 2 diabetes mellitus without complications: Secondary | ICD-10-CM | POA: Diagnosis not present

## 2015-06-20 DIAGNOSIS — Y832 Surgical operation with anastomosis, bypass or graft as the cause of abnormal reaction of the patient, or of later complication, without mention of misadventure at the time of the procedure: Secondary | ICD-10-CM | POA: Diagnosis not present

## 2015-06-20 DIAGNOSIS — I10 Essential (primary) hypertension: Secondary | ICD-10-CM | POA: Diagnosis not present

## 2015-06-20 DIAGNOSIS — Z79891 Long term (current) use of opiate analgesic: Secondary | ICD-10-CM | POA: Diagnosis not present

## 2015-06-20 DIAGNOSIS — Z86718 Personal history of other venous thrombosis and embolism: Secondary | ICD-10-CM | POA: Diagnosis not present

## 2015-06-20 DIAGNOSIS — E785 Hyperlipidemia, unspecified: Secondary | ICD-10-CM | POA: Diagnosis not present

## 2015-06-20 DIAGNOSIS — K222 Esophageal obstruction: Secondary | ICD-10-CM | POA: Diagnosis not present

## 2015-06-20 DIAGNOSIS — Z7982 Long term (current) use of aspirin: Secondary | ICD-10-CM | POA: Diagnosis not present

## 2015-06-20 DIAGNOSIS — Z905 Acquired absence of kidney: Secondary | ICD-10-CM | POA: Diagnosis not present

## 2015-06-20 DIAGNOSIS — Z79899 Other long term (current) drug therapy: Secondary | ICD-10-CM | POA: Diagnosis not present

## 2015-06-20 DIAGNOSIS — G4700 Insomnia, unspecified: Secondary | ICD-10-CM | POA: Diagnosis not present

## 2015-06-20 DIAGNOSIS — Z6841 Body Mass Index (BMI) 40.0 and over, adult: Secondary | ICD-10-CM | POA: Diagnosis not present

## 2015-06-21 ENCOUNTER — Other Ambulatory Visit: Payer: Self-pay | Admitting: *Deleted

## 2015-06-21 NOTE — Patient Outreach (Signed)
[place] [facility]) Care Management  06/21/2015  Denise Davis 1965/08/15 [id number]   Phone call made to the [facility] front office by patient to cancel initial appointment with this social worker.  Per patient, she is having surgery that day.  Plan:   This Education officer, museum will call patient to re-schedule appointment.   Sheralyn Boatman [facility] Care Management [phone]

## 2015-06-22 ENCOUNTER — Ambulatory Visit: Payer: Self-pay | Admitting: *Deleted

## 2015-06-26 DIAGNOSIS — Z9884 Bariatric surgery status: Secondary | ICD-10-CM | POA: Diagnosis not present

## 2015-06-26 DIAGNOSIS — Z09 Encounter for follow-up examination after completed treatment for conditions other than malignant neoplasm: Secondary | ICD-10-CM | POA: Diagnosis not present

## 2015-06-29 DIAGNOSIS — G4733 Obstructive sleep apnea (adult) (pediatric): Secondary | ICD-10-CM | POA: Diagnosis not present

## 2015-06-29 DIAGNOSIS — K219 Gastro-esophageal reflux disease without esophagitis: Secondary | ICD-10-CM | POA: Diagnosis not present

## 2015-06-29 DIAGNOSIS — R42 Dizziness and giddiness: Secondary | ICD-10-CM | POA: Diagnosis not present

## 2015-06-29 DIAGNOSIS — Y838 Other surgical procedures as the cause of abnormal reaction of the patient, or of later complication, without mention of misadventure at the time of the procedure: Secondary | ICD-10-CM | POA: Diagnosis not present

## 2015-06-29 DIAGNOSIS — K9589 Other complications of other bariatric procedure: Secondary | ICD-10-CM | POA: Diagnosis not present

## 2015-06-29 DIAGNOSIS — R1310 Dysphagia, unspecified: Secondary | ICD-10-CM | POA: Diagnosis not present

## 2015-06-29 DIAGNOSIS — K222 Esophageal obstruction: Secondary | ICD-10-CM | POA: Diagnosis not present

## 2015-06-29 DIAGNOSIS — Z905 Acquired absence of kidney: Secondary | ICD-10-CM | POA: Diagnosis not present

## 2015-06-29 DIAGNOSIS — M1990 Unspecified osteoarthritis, unspecified site: Secondary | ICD-10-CM | POA: Diagnosis not present

## 2015-06-29 DIAGNOSIS — F329 Major depressive disorder, single episode, unspecified: Secondary | ICD-10-CM | POA: Diagnosis not present

## 2015-06-29 DIAGNOSIS — E119 Type 2 diabetes mellitus without complications: Secondary | ICD-10-CM | POA: Diagnosis not present

## 2015-06-29 DIAGNOSIS — G4700 Insomnia, unspecified: Secondary | ICD-10-CM | POA: Diagnosis not present

## 2015-06-29 DIAGNOSIS — Z4802 Encounter for removal of sutures: Secondary | ICD-10-CM | POA: Diagnosis not present

## 2015-06-29 DIAGNOSIS — I10 Essential (primary) hypertension: Secondary | ICD-10-CM | POA: Diagnosis not present

## 2015-06-29 DIAGNOSIS — Z6841 Body Mass Index (BMI) 40.0 and over, adult: Secondary | ICD-10-CM | POA: Diagnosis not present

## 2015-06-29 DIAGNOSIS — R112 Nausea with vomiting, unspecified: Secondary | ICD-10-CM | POA: Diagnosis not present

## 2015-06-29 DIAGNOSIS — Z049 Encounter for examination and observation for unspecified reason: Secondary | ICD-10-CM | POA: Diagnosis not present

## 2015-06-29 DIAGNOSIS — Z8553 Personal history of malignant neoplasm of renal pelvis: Secondary | ICD-10-CM | POA: Diagnosis not present

## 2015-06-29 DIAGNOSIS — T182XXA Foreign body in stomach, initial encounter: Secondary | ICD-10-CM | POA: Diagnosis not present

## 2015-06-29 DIAGNOSIS — K311 Adult hypertrophic pyloric stenosis: Secondary | ICD-10-CM | POA: Diagnosis not present

## 2015-07-04 ENCOUNTER — Other Ambulatory Visit: Payer: Self-pay | Admitting: *Deleted

## 2015-07-04 NOTE — Patient Outreach (Signed)
[place] [facility]) Care Management  07/04/2015  DOMNIQUE RAYSOR 07-29-1965 [id number]  Unsuccessful telephone outreach to 50 year old female followed by [facility] for [facility] of Care after discharge on June 13, 2015. Patient had gastric-bypass done at [facility]. With last telephone outreach, patient reported that she was having an upper endoscopy on Thursday June 22, 2015 at [facility].  Last [facility] (initial) home visit with Kathie Rhodes, [facility], was on Friday, June 16, 2015.   HIPPA compliant VM msg left for patient, asking her to call me back; my contact details were left on her voice mail.  Plan: Will re-attempt telephone outreach later this week if I do not hear back from the patient first.  [name], RN, BSN, CCRN [facility] Care Management  [phone]

## 2015-07-07 ENCOUNTER — Ambulatory Visit: Payer: Self-pay | Admitting: *Deleted

## 2015-07-07 ENCOUNTER — Other Ambulatory Visit: Payer: Self-pay | Admitting: *Deleted

## 2015-07-07 NOTE — Patient Outreach (Signed)
[facility]) Care Management  07/07/2015  Denise Davis 1966/01/10 [id number]  Successful telephone outreach to 50 year old female followed by [facility] for Transition of Care after discharge on June 13, 2015. Patient had gastric-bypass done at [facility], as well as a recent endoscopy on June 22, 2015.  HIPPA verified.  This afternoon, patient reported that she is doing well, and is continuing to be followed by [facility] RN.  She states that she "does not have much of an appetite," but otherwise feels "pretty good."  She states she is taking her medications and attending provider appointments as scheduled.  I attempted to make an appointment for a home visit, but the patient stated that she wishes to wait until later; stating she did not know now when her schedule would allow a visit around [facility].  I asked her if I could keep in contact with her by telephone to check in, and she said that she would prefer that at this point.  I made sure the patient had my name and contact details should she wish to contact me before I call her back.  Plan: I will call this patient next week for continued transition of care follow up, week 4, and attempt to schedule next [place] home visit at that time.  [name], RN, BSN, [employer] [facility] Care Management  [phone]

## 2015-07-10 ENCOUNTER — Other Ambulatory Visit: Payer: Self-pay | Admitting: *Deleted

## 2015-07-10 NOTE — Patient Outreach (Addendum)
[place] [facility]) Care Management  07/10/2015  Denise Davis 08-30-1965 [id number]   Phone call to patient to re-schedule home visit after initial home visit was cancelled on 06/22/15.  HIPPA compliant voicemail message left for a return call.    Sheralyn Boatman [facility] Care Management [phone]

## 2015-07-11 IMAGING — CT CT HEAD W/O CM
2 series · 15 of 30 positions shown, 19 images · non-contrast
Comparison: None.

CLINICAL DATA: Loss of consciousness.  Right-sided weakness.

EXAM:
CT HEAD WITHOUT CONTRAST
TECHNIQUE: Contiguous axial images were obtained from the base of the skull
through the vertex without intravenous contrast.

[Series 201: head w/o, idose (1) · axial · non-contrast · 0.49mm/px · z∈[+47,+182]mm · 13 of 33 slices shown, 17 images]
[im 3/33  brain]
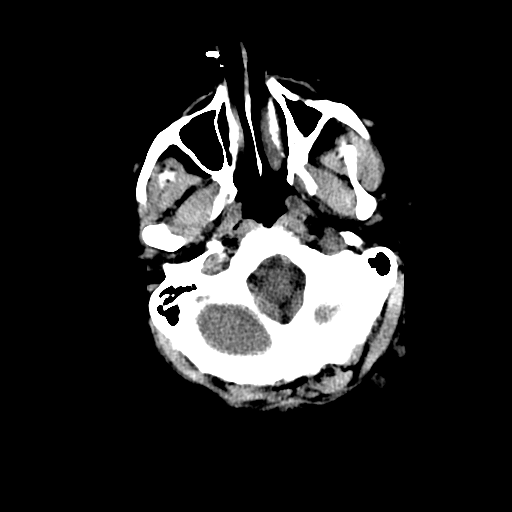
[im 3/33  bone]
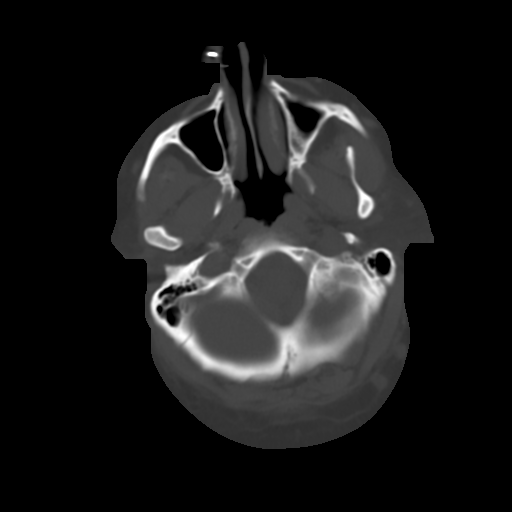
[im 5/33  brain]
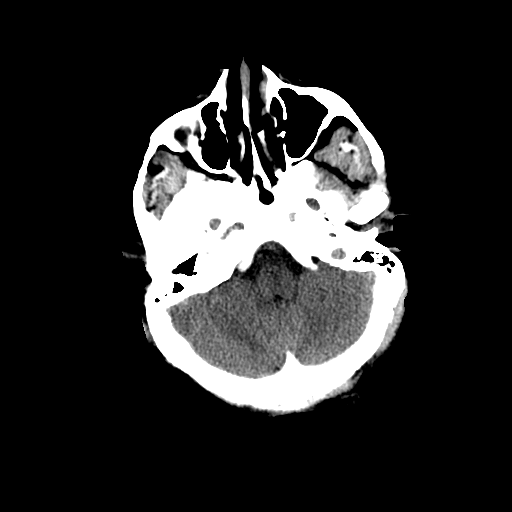
[im 7/33  brain]
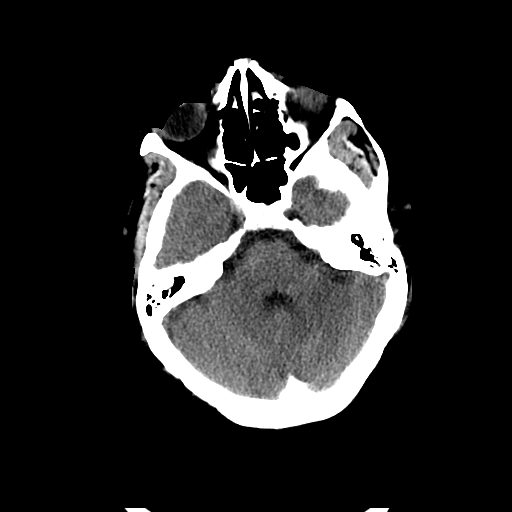
[im 10/33  brain]
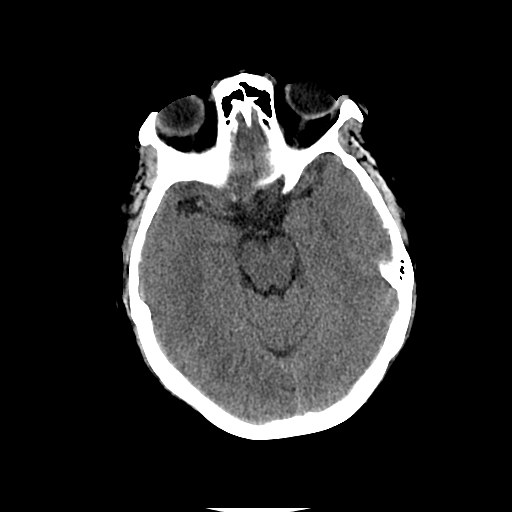
[im 12/33  brain]
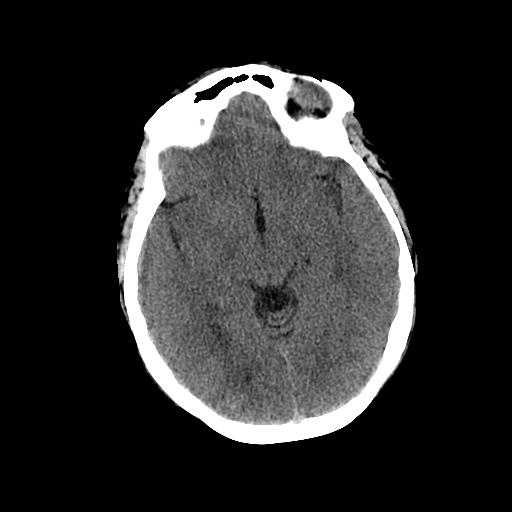
[im 12/33  bone]
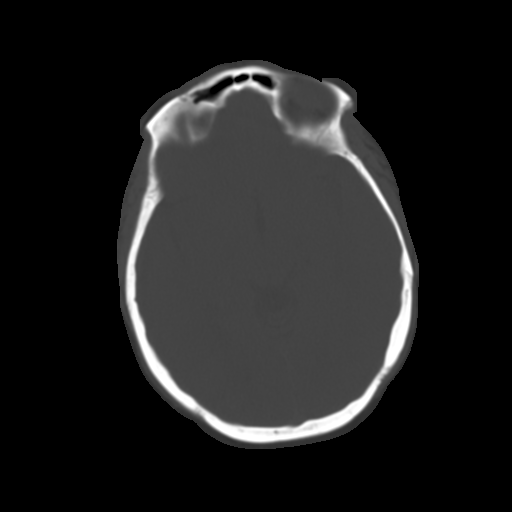
[im 14/33  brain]
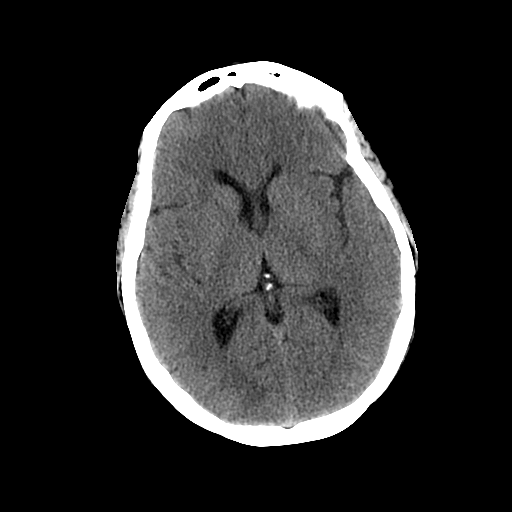
[im 17/33  brain]
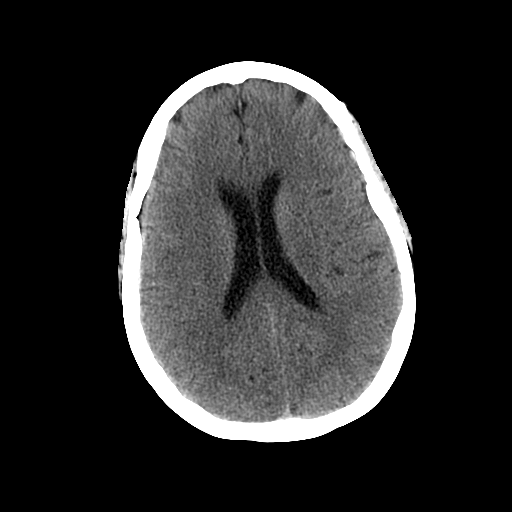
[im 19/33  brain]
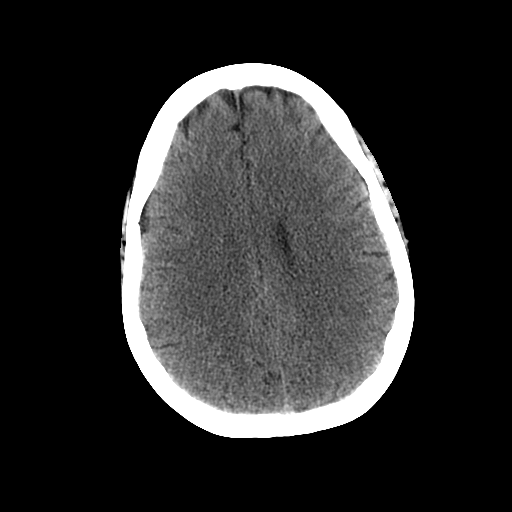
[im 21/33  brain]
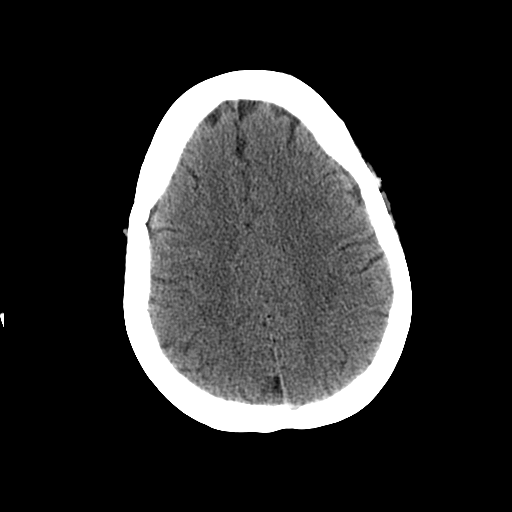
[im 21/33  bone]
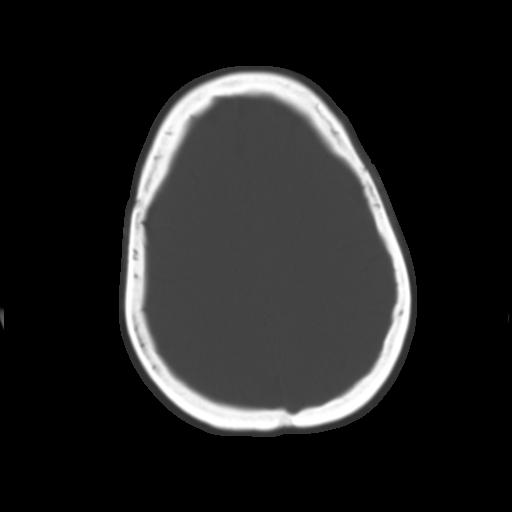
[im 23/33  brain]
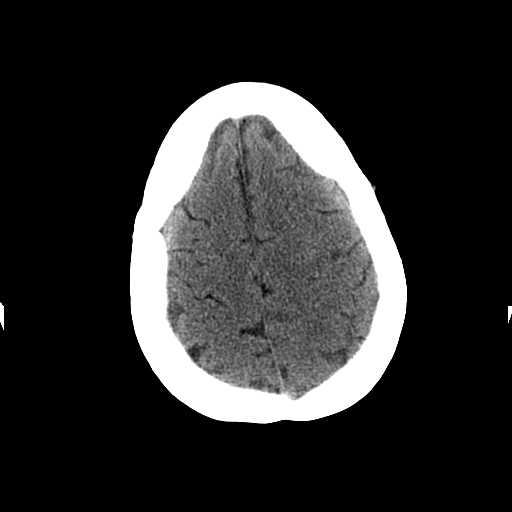
[im 26/33  brain]
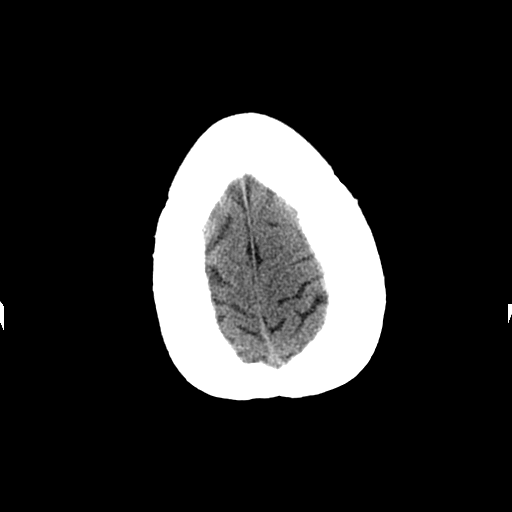
[im 28/33  brain]
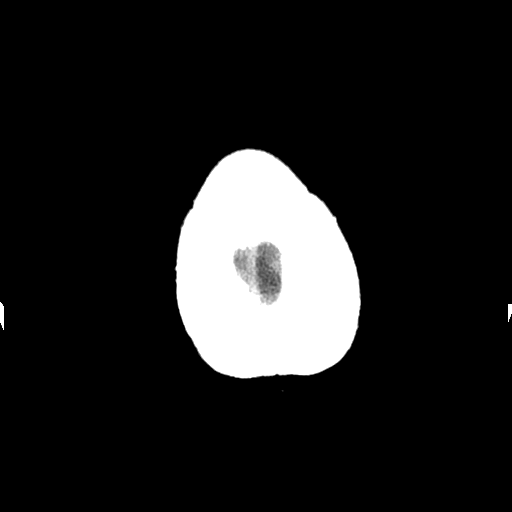
[im 30/33  brain]
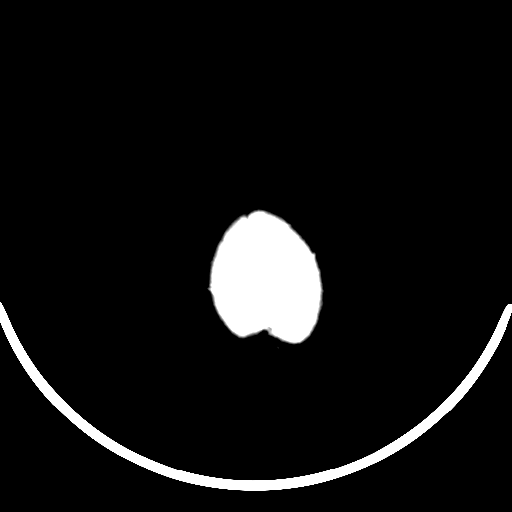
[im 30/33  bone]
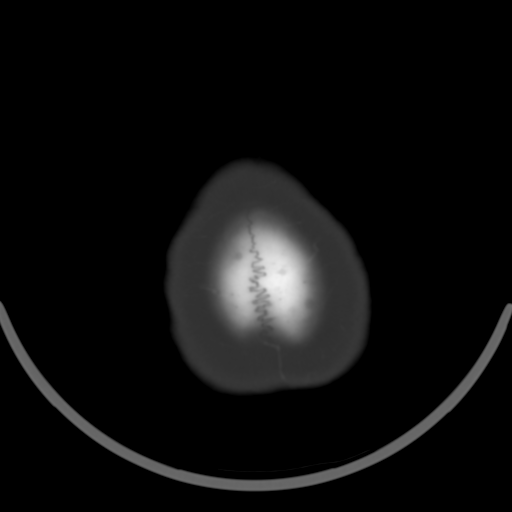

[Series 202: head w/o bone, idose (1) · axial · non-contrast · 0.49mm/px · z∈[+47,+67]mm · 2 of 33 slices shown]
[im 3/33  bone]
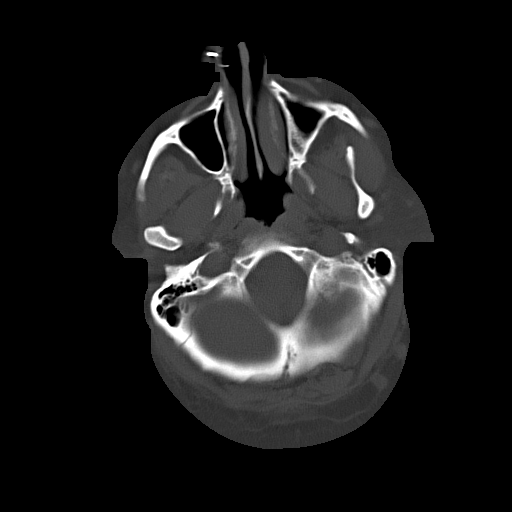
[im 7/33  bone]
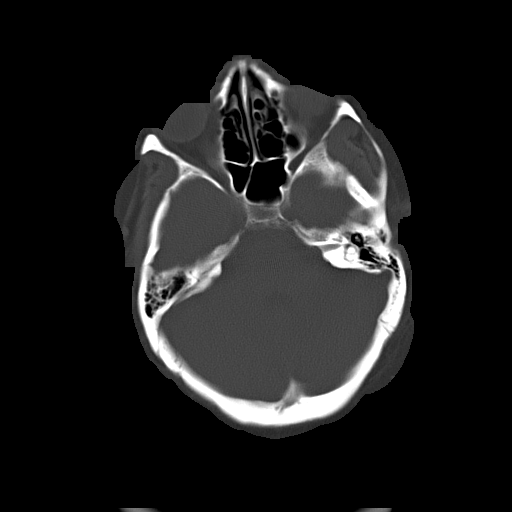

[15 of 30 positions shown; findings below may reference images not displayed]

FINDINGS: There is no evidence of acute infarction, mass lesion, or intra- or
extra-axial hemorrhage on CT.

The posterior fossa, including the cerebellum, brainstem and fourth
ventricle, is within normal limits. The third and lateral
ventricles, and basal ganglia are unremarkable in appearance. The
cerebral hemispheres are symmetric in appearance, with normal
gray-white differentiation. No mass effect or midline shift is seen.

There is no evidence of fracture; visualized osseous structures are
unremarkable in appearance. The orbits are within normal limits. The
paranasal sinuses and mastoid air cells are well-aerated. No
significant soft tissue abnormalities are seen.
IMPRESSION: Unremarkable noncontrast CT of the head.

## 2015-07-11 IMAGING — CR DG CHEST 2V
2 series · 2 of 2 positions shown · non-contrast
Comparison: 08/16/2012

CLINICAL DATA: Mid to RIGHT chest pain, shortness of breath,
history hypertension, diabetes, renal cell carcinoma

EXAM:
CHEST  2 VIEW

[w chest pa]
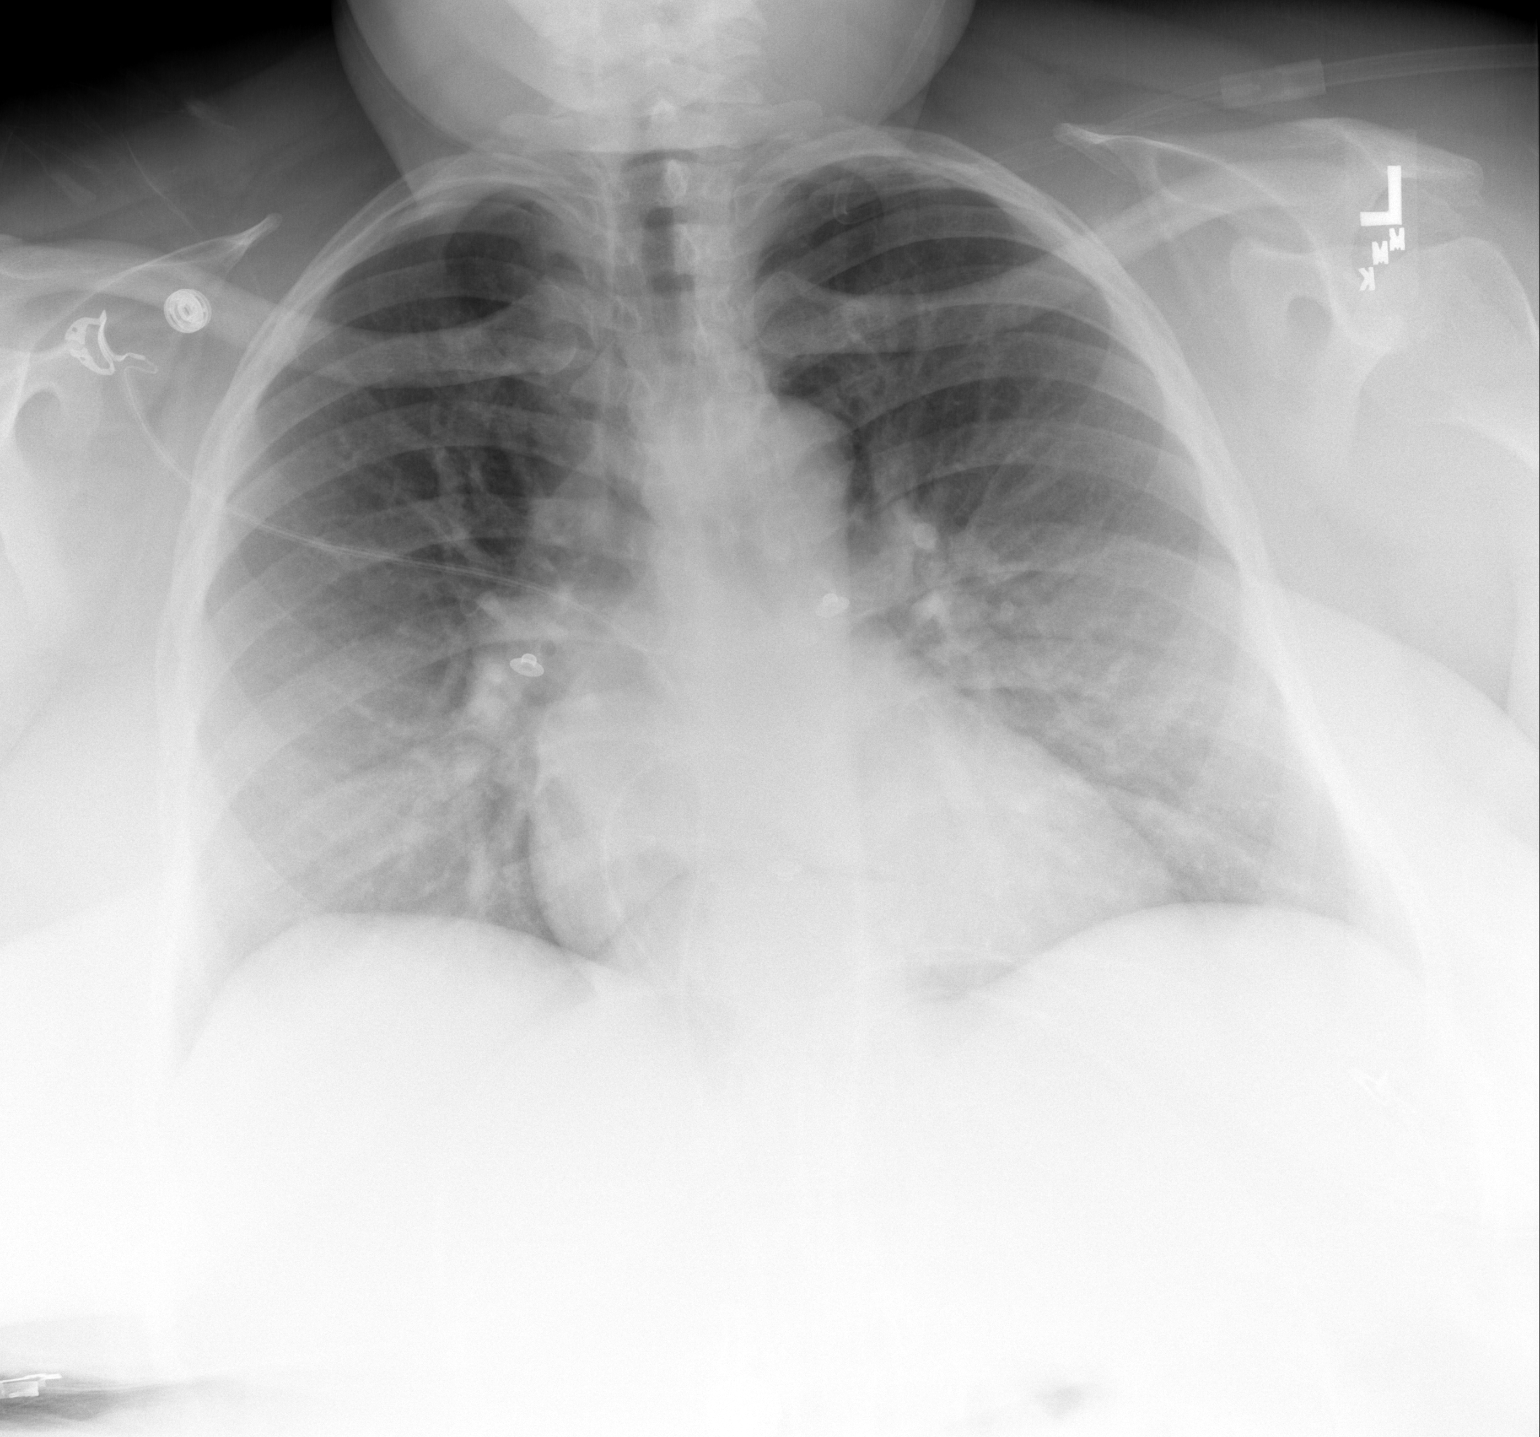

[w chest lat]
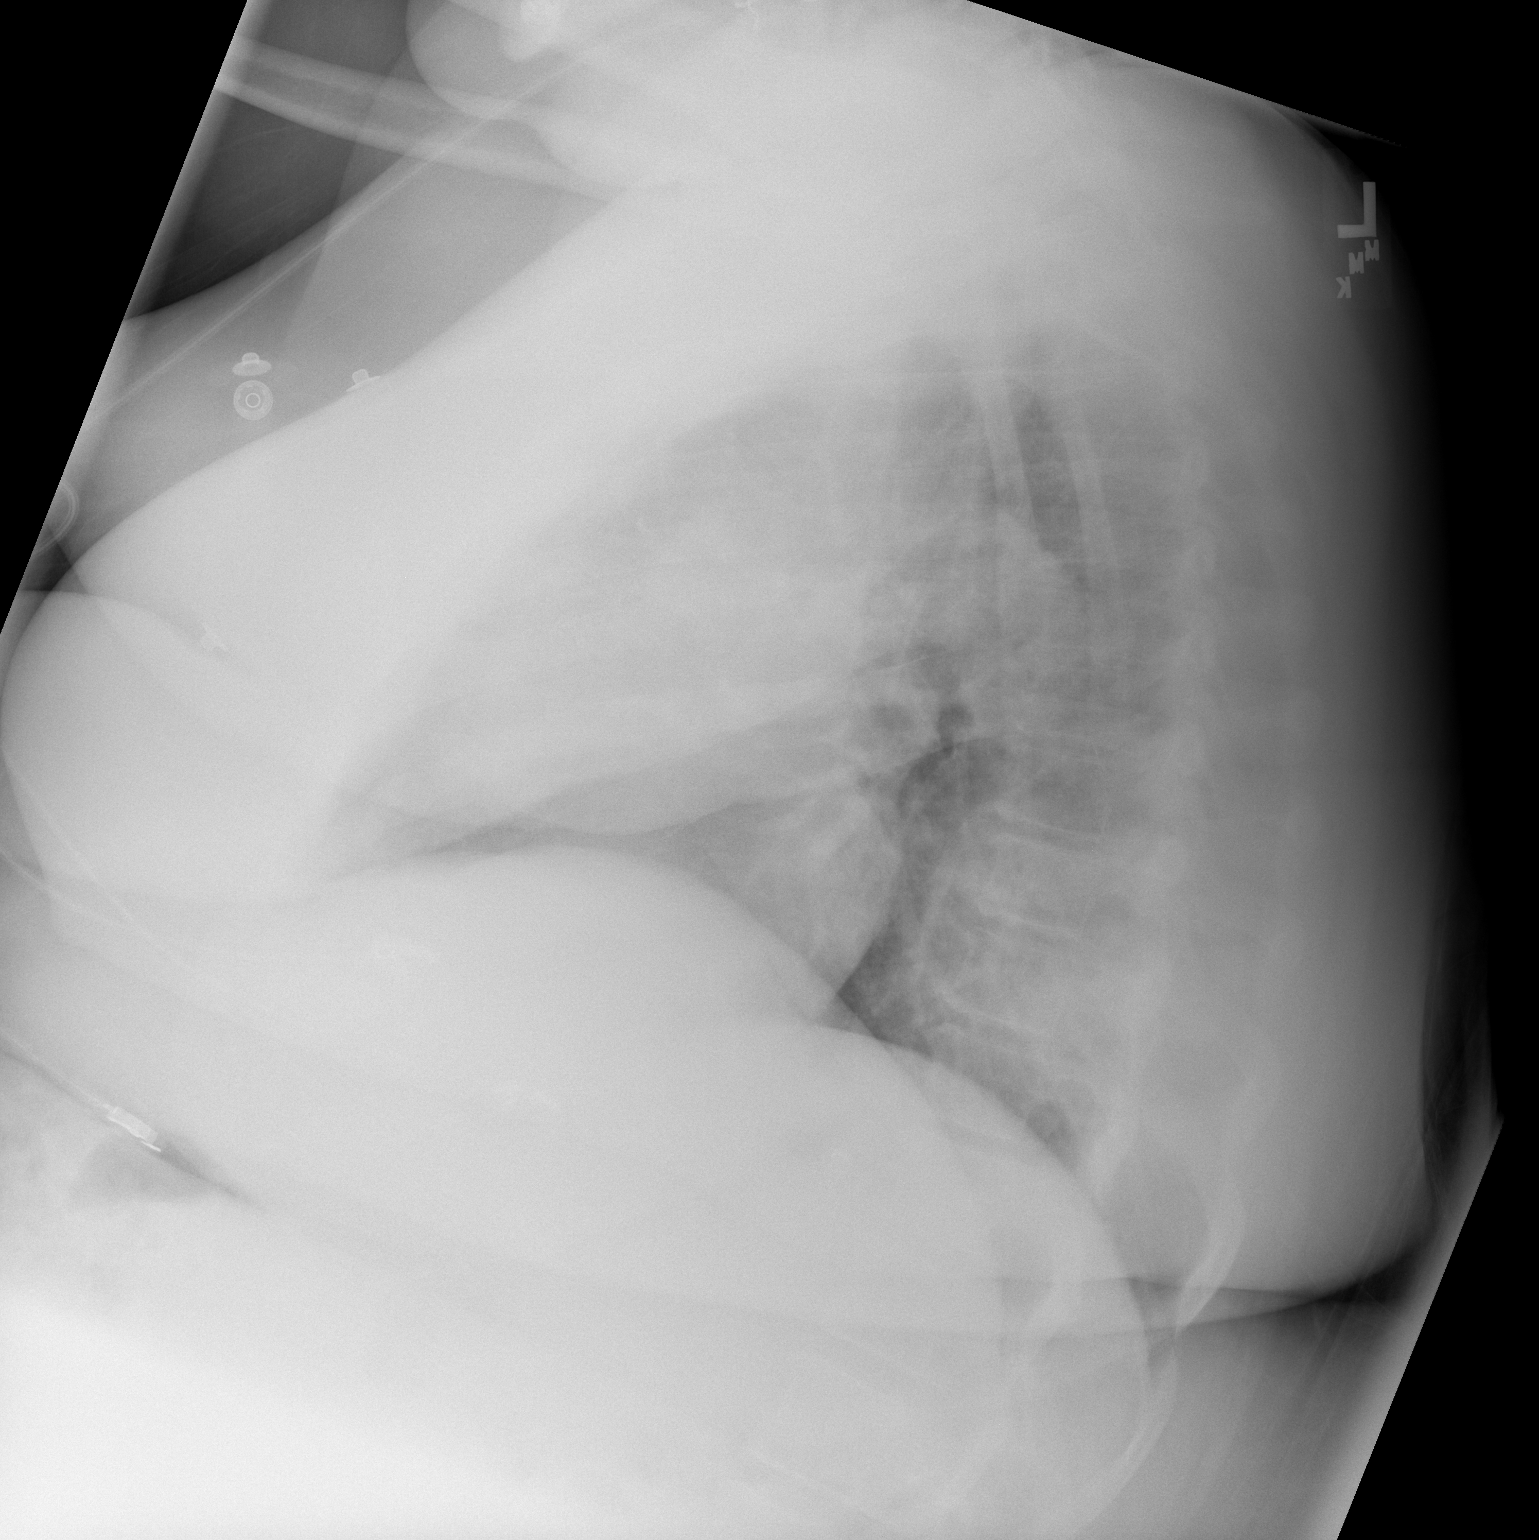

[2 of 2 positions shown; findings below may reference images not displayed]

FINDINGS: Enlargement of cardiac silhouette.

Slight pulmonary vascular congestion.

Mediastinal contours normal.

No acute infiltrate, pleural effusion, or pneumothorax.

Bones unremarkable.
IMPRESSION: Enlargement of cardiac silhouette with slight pulmonary vascular
congestion.

No acute abnormalities.

## 2015-07-12 ENCOUNTER — Other Ambulatory Visit: Payer: Self-pay | Admitting: *Deleted

## 2015-07-12 NOTE — Patient Outreach (Signed)
[place] [facility]) Care Management [place] Telephone Outreach 07/12/2015  PRAKRITI WALENTA March 28, 1966 [id number]  Unsuccessful telephone outreach to 50 year old female followed by [facility] for Transition of Care after discharge on June 13, 2015. Patient had gastric-bypass done at [facility]. With last telephone outreach, patient reported that she was doing well with [facility] continuing to assist with her ongoing care.  Mrs. Quesnell asked at that time that I follow up with her on an ongoing basis by phone, and did not wish to schedule a home visit at that time.   HIPPA compliant VM msg left for patient, asking her to call me back; my contact details were left on her voice mail.  Plan: Will re-attempt telephone outreach later this week if I do not hear back from the patient first.  [name], RN, BSN, CCRN [facility] Care Management  [phone]

## 2015-07-13 ENCOUNTER — Ambulatory Visit: Payer: Self-pay | Admitting: *Deleted

## 2015-07-14 ENCOUNTER — Other Ambulatory Visit: Payer: Self-pay | Admitting: *Deleted

## 2015-07-14 DIAGNOSIS — Z9884 Bariatric surgery status: Secondary | ICD-10-CM | POA: Diagnosis not present

## 2015-07-14 DIAGNOSIS — Z9889 Other specified postprocedural states: Secondary | ICD-10-CM | POA: Diagnosis not present

## 2015-07-14 DIAGNOSIS — Z79899 Other long term (current) drug therapy: Secondary | ICD-10-CM | POA: Diagnosis not present

## 2015-07-14 DIAGNOSIS — R1310 Dysphagia, unspecified: Secondary | ICD-10-CM | POA: Diagnosis not present

## 2015-07-14 DIAGNOSIS — Z792 Long term (current) use of antibiotics: Secondary | ICD-10-CM | POA: Diagnosis not present

## 2015-07-14 DIAGNOSIS — R1110 Vomiting, unspecified: Secondary | ICD-10-CM | POA: Diagnosis not present

## 2015-07-14 DIAGNOSIS — R112 Nausea with vomiting, unspecified: Secondary | ICD-10-CM | POA: Diagnosis not present

## 2015-07-14 DIAGNOSIS — Z931 Gastrostomy status: Secondary | ICD-10-CM | POA: Diagnosis not present

## 2015-07-14 DIAGNOSIS — R5383 Other fatigue: Secondary | ICD-10-CM | POA: Diagnosis not present

## 2015-07-14 NOTE — Patient Outreach (Signed)
[name]) Care Management [place] Telephone Outreach 07/14/2015  Denise Davis [birth year] [id number]  Successful telephone outreach to 50 year old female followed by [facility] for Transition of Care after discharge on June 13, 2015. Patient had gastric-bypass done at [facility]. Today, Denise Davis reports that she is at the [place] office, and is going to have another procedure, which she believes is an upper endosopy, on Monday, July 17, 2015 to evaluate her ongoing GI issues/ poor appetite.    Denise Davis reported that she was otherwise doing well with [facility] continuing to assist with her ongoing care. Denise Davis asked that I call her back next week to check in, and possibly schedule a [facility] CM in-home visit.  Plan: Will reach out to Denise Davis next week by telephone as she has requested.  [name], RN, BSN, [employer] [facility] Care Management  [phone]

## 2015-07-17 DIAGNOSIS — Z7984 Long term (current) use of oral hypoglycemic drugs: Secondary | ICD-10-CM | POA: Diagnosis not present

## 2015-07-17 DIAGNOSIS — G4733 Obstructive sleep apnea (adult) (pediatric): Secondary | ICD-10-CM | POA: Diagnosis not present

## 2015-07-17 DIAGNOSIS — Z9884 Bariatric surgery status: Secondary | ICD-10-CM | POA: Diagnosis not present

## 2015-07-17 DIAGNOSIS — Z888 Allergy status to other drugs, medicaments and biological substances status: Secondary | ICD-10-CM | POA: Diagnosis not present

## 2015-07-17 DIAGNOSIS — Z7951 Long term (current) use of inhaled steroids: Secondary | ICD-10-CM | POA: Diagnosis not present

## 2015-07-17 DIAGNOSIS — Z09 Encounter for follow-up examination after completed treatment for conditions other than malignant neoplasm: Secondary | ICD-10-CM | POA: Diagnosis not present

## 2015-07-17 DIAGNOSIS — Z79899 Other long term (current) drug therapy: Secondary | ICD-10-CM | POA: Diagnosis not present

## 2015-07-17 DIAGNOSIS — I10 Essential (primary) hypertension: Secondary | ICD-10-CM | POA: Diagnosis not present

## 2015-07-17 DIAGNOSIS — E119 Type 2 diabetes mellitus without complications: Secondary | ICD-10-CM | POA: Diagnosis not present

## 2015-07-17 DIAGNOSIS — Z85528 Personal history of other malignant neoplasm of kidney: Secondary | ICD-10-CM | POA: Diagnosis not present

## 2015-07-17 DIAGNOSIS — Z6841 Body Mass Index (BMI) 40.0 and over, adult: Secondary | ICD-10-CM | POA: Diagnosis not present

## 2015-07-17 DIAGNOSIS — Z7982 Long term (current) use of aspirin: Secondary | ICD-10-CM | POA: Diagnosis not present

## 2015-07-17 DIAGNOSIS — K228 Other specified diseases of esophagus: Secondary | ICD-10-CM | POA: Diagnosis not present

## 2015-07-17 DIAGNOSIS — R112 Nausea with vomiting, unspecified: Secondary | ICD-10-CM | POA: Diagnosis not present

## 2015-07-17 DIAGNOSIS — Z905 Acquired absence of kidney: Secondary | ICD-10-CM | POA: Diagnosis not present

## 2015-07-17 DIAGNOSIS — K219 Gastro-esophageal reflux disease without esophagitis: Secondary | ICD-10-CM | POA: Diagnosis not present

## 2015-07-17 DIAGNOSIS — R1314 Dysphagia, pharyngoesophageal phase: Secondary | ICD-10-CM | POA: Diagnosis not present

## 2015-07-17 DIAGNOSIS — G4700 Insomnia, unspecified: Secondary | ICD-10-CM | POA: Diagnosis not present

## 2015-07-17 DIAGNOSIS — R569 Unspecified convulsions: Secondary | ICD-10-CM | POA: Diagnosis not present

## 2015-07-17 DIAGNOSIS — F410 Panic disorder [episodic paroxysmal anxiety] without agoraphobia: Secondary | ICD-10-CM | POA: Diagnosis not present

## 2015-07-17 DIAGNOSIS — Z8249 Family history of ischemic heart disease and other diseases of the circulatory system: Secondary | ICD-10-CM | POA: Diagnosis not present

## 2015-07-17 DIAGNOSIS — R1310 Dysphagia, unspecified: Secondary | ICD-10-CM | POA: Diagnosis not present

## 2015-07-17 DIAGNOSIS — Z79891 Long term (current) use of opiate analgesic: Secondary | ICD-10-CM | POA: Diagnosis not present

## 2015-07-17 DIAGNOSIS — F329 Major depressive disorder, single episode, unspecified: Secondary | ICD-10-CM | POA: Diagnosis not present

## 2015-07-17 DIAGNOSIS — E785 Hyperlipidemia, unspecified: Secondary | ICD-10-CM | POA: Diagnosis not present

## 2015-07-17 DIAGNOSIS — M1990 Unspecified osteoarthritis, unspecified site: Secondary | ICD-10-CM | POA: Diagnosis not present

## 2015-07-20 ENCOUNTER — Other Ambulatory Visit: Payer: Self-pay | Admitting: *Deleted

## 2015-07-20 NOTE — Patient Outreach (Signed)
[place] [facility]) Care Management  07/20/2015  Denise Davis 12/30/1965 [id number]  Successful telephone outreach to 50 year old female followed by [facility] for Transition of Care after discharge on June 13, 2015.  Patient had gastric-bypass done at [facility].  Today, Denise Davis reports that she is "doing pretty good," after her upper endosopy, on Monday, July 17, 2015 at [facility] to evaluate her ongoing GI issues/ poor appetite after her recent gastric bypass.  Denise Davis reports ongoing issues with poor appetite and feeling weak and drained.    Denise Davis reported that [facility] has now stopped, and reported that she has 2 upcoming provider appointments scheduled, which she plans to attend.  Denise Davis states that she "try's to take her medicines" as they are prescribed, but admits that "some days" she does not take her medicines secondary to poor appetite and just not wanting to eat.  I encouraged patient to keep her medical providers informed of any problems or issues she has, as well as to continue working with her already established plan of care for improved appetite/ nutrition, which she agreed to do.  I made Denise Davis aware of my general schedule for the coming week, and made sure she had my direct phone contact, as well as the main number for [facility] CM.  Today, Denise Davis and scheduled an appointment for an in-home visit in early May 2017, and requested that I bring her a BP cuff so that she can measure her BP readings at home.    Plan: Denise Davis will continue her current plan of care for appetite and nutrition Denise Davis will take her medications as prescribed and keep all scheduled provider appointments. Denise Davis will notify her medical providers for any problems or issues she experiences. [place] home visit planned for early May 2017.  [name], RN, BSN, [employer] [facility] Care  Management  [phone]

## 2015-07-24 ENCOUNTER — Other Ambulatory Visit: Payer: Self-pay | Admitting: *Deleted

## 2015-07-24 NOTE — Patient Outreach (Signed)
[place] [facility] [facility]) Care Management  07/24/2015  MADIGAN HELLUMS 1966-01-17 [id number]   Phone call to patient to  Follow up on depression screening results.  HIPPA compliant voicemail message left requesting a return call.    Sheralyn Boatman [facility] Care Management [phone]

## 2015-07-28 ENCOUNTER — Encounter: Payer: Self-pay | Admitting: *Deleted

## 2015-07-28 ENCOUNTER — Other Ambulatory Visit: Payer: Self-pay | Admitting: *Deleted

## 2015-07-28 NOTE — Patient Outreach (Signed)
[place] [facility]) Care Management  07/28/2015  Denise Davis 09-20-1965 [id number]   Phone call to patient to follow up on patient's depression screening.  Per patient, she is feeling much better in regards to her mood due to the encouragement and support of her family.  However continues with poor appetite, nausea and feeling drained. Patient has discussed this with her doctor and RNCM. She denies suicide or homicide ideation.  Patient reports being active with [facility], however has not gone since she had her surgery.  Per patient, she has an appointment on 08/11/15.  Patient declined a visit with this social worker at this time due to future appointment with [facility].  Patient's case to be closed to social work, patient encouraged to call this social worker if any social work issues arise in the future.   Sheralyn Boatman [facility] Care Management [phone]

## 2015-08-02 DIAGNOSIS — R55 Syncope and collapse: Secondary | ICD-10-CM | POA: Diagnosis not present

## 2015-08-02 DIAGNOSIS — G40909 Epilepsy, unspecified, not intractable, without status epilepticus: Secondary | ICD-10-CM | POA: Diagnosis not present

## 2015-08-02 DIAGNOSIS — G8929 Other chronic pain: Secondary | ICD-10-CM | POA: Diagnosis not present

## 2015-08-02 DIAGNOSIS — M5442 Lumbago with sciatica, left side: Secondary | ICD-10-CM | POA: Diagnosis not present

## 2015-08-02 DIAGNOSIS — M5441 Lumbago with sciatica, right side: Secondary | ICD-10-CM | POA: Diagnosis not present

## 2015-08-04 DIAGNOSIS — Z23 Encounter for immunization: Secondary | ICD-10-CM | POA: Diagnosis not present

## 2015-08-04 DIAGNOSIS — Z0000 Encounter for general adult medical examination without abnormal findings: Secondary | ICD-10-CM | POA: Diagnosis not present

## 2015-08-04 DIAGNOSIS — Z139 Encounter for screening, unspecified: Secondary | ICD-10-CM | POA: Diagnosis not present

## 2015-08-04 DIAGNOSIS — E669 Obesity, unspecified: Secondary | ICD-10-CM | POA: Diagnosis not present

## 2015-08-04 DIAGNOSIS — R7309 Other abnormal glucose: Secondary | ICD-10-CM | POA: Diagnosis not present

## 2015-08-04 DIAGNOSIS — N390 Urinary tract infection, site not specified: Secondary | ICD-10-CM | POA: Diagnosis not present

## 2015-08-04 DIAGNOSIS — Z1389 Encounter for screening for other disorder: Secondary | ICD-10-CM | POA: Diagnosis not present

## 2015-08-07 ENCOUNTER — Other Ambulatory Visit: Payer: Self-pay | Admitting: *Deleted

## 2015-08-07 ENCOUNTER — Encounter: Payer: Self-pay | Admitting: *Deleted

## 2015-08-07 NOTE — Patient Outreach (Signed)
[place]) Care Management   08/07/2015  SHARAYAH Davis 1966-01-30 [id number]  [name] is an 50 y.o. female followed by [facility] initially for transition of care after IP [facility] discharge on June 13, 2015.  Patient had gastric-bypass done at [facility]. Since her discharge home, Denise Davis has lost "over one hundred pounds," but has experienced ongoing nausea, sometimes associated with vomiting, on a regular basis.  Denise Davis states that the ongoing nausea makes it difficult to eat and sometimes to take her medications.  Denise Davis stated today that she had a good weekend and was able to go to church; she also stated that she "drove herself to" her PCP appointment on Friday, Aug 04, 2015, where she placed on an antibiotic (Cipro) "for a UTI."  Denise Davis stated that she wants to feel good enough to get out more frequently.  Currently, Denise Davis has a G-tube in place, where she administers high protein nutritional supplements.  Denise Davis also states that she blends vegetables to puree and administers through G-tube as well.  Denise Davis states that her oral intake is "minimal," stating that the nausea she experiences on a regular basis makes it "very difficult" to eat.  Denise Davis has an excellent understanding of her prescribed medications and states that she takes all of her medications as prescribed, except for Keppra, NTG, and oxycodone.  Denise Davis has questions about these medications, and it under the impression from previous provider interactions that these are no longer active medications for her.  I encouraged her to follow up with her providers about these medications asap, and she agreed to do so.  Denise Davis denies a "true" seizure disorder, and states "they put me on it because I had blackout spells."  Denise Davis states that she has a good support system through her family, and reports no community resource needs.   Denise Davis reports that she would like to have grab bars placed by her toilet, a home blood pressure monitor (brought to her today to home visit), and a thermometer; we reviewed her basic insurance information and she confirmed that she will cal them to inquire about these items.  Denise Davis reports self monitors her daily weights and blood sugars, but currently does not record either.  We discussed [place] CM program and services, and I made sure that Mrs. Anzalone has my direct contact information should she wish to contact me.  Subjective: "It felt so good getting out and about this weekend.  I want to be able to feel good enough to get out more, but usually, I wake up fearful every morning that I will be too nauseated to do anything."  Objective:    BP 132/68 mmHg  Pulse 90  Resp 18  Ht 1.651 m (5\' 5" )  Wt 312 lb (141.522 kg)  BMI 51.92 kg/m2  SpO2 98%   Review of Systems  Constitutional: Positive for weight loss and malaise/fatigue. Negative for fever.       Pt. reports 100-pound weight loss after her gastric bypass surgey in March 2017; she reports intermittent weakness and malaise related to ongoing nausea after her surgery  Respiratory: Negative.  Negative for cough, shortness of breath and wheezing.   Cardiovascular: Negative.  Negative for leg swelling.  Gastrointestinal: Positive for nausea, vomiting and abdominal pain.       Pt. reports 100-pound weight loss after her gastric bypass surgey in March 2017; she reports intermittent weakness and  malaise related to ongoing nausea after her surgery, as well as intermittent positional pain at her G-tube site, (L) abdomen   Genitourinary: Positive for urgency and frequency.       Pt. reports that she saw her PCP, Dr. Lennox Grumbles, on Friday Aug 04, 2015 and was diagnosed with a UTI; pt. reports that she was placed on Cirpo at that time.  Musculoskeletal: Positive for joint pain. Negative for falls.       Bilateral chronic knee  pain, patient states, "from my weight;" patient states, "I know I need to have knee replacement surgery."  Neurological: Positive for weakness.  Psychiatric/Behavioral: Positive for depression. The patient is not nervous/anxious.        Patient reports that she sees [facility] specialist for onging depression    Physical Exam  Constitutional: She is oriented to person, place, and time. She appears well-developed and well-nourished.  Cardiovascular: Normal rate, regular rhythm, normal heart sounds and intact distal pulses.   Respiratory: Effort normal and breath sounds normal. No respiratory distress. She has no wheezes. She has no rales.  GI: Soft. Bowel sounds are normal.  G-tube site (L) lower abdomen is intact, skin at site is clean, dry, and without redness  Neurological: She is alert and oriented to person, place, and time.  Skin: Skin is warm and dry.  Psychiatric: She has a normal mood and affect. Her behavior is normal. Judgment and thought content normal.    Encounter Medications:   Outpatient Encounter Prescriptions as of 08/07/2015  Medication Sig Note  . albuterol (PROVENTIL HFA) 108 (90 BASE) MCG/ACT inhaler Inhale 2 puffs into the lungs every 4 (four) hours as needed for wheezing or shortness of breath.   Marland Kitchen amLODipine (NORVASC) 10 MG tablet Take 1 tablet (10 mg total) by mouth daily.   Marland Kitchen aspirin EC 81 MG tablet Take 81 mg by mouth daily.   . calcium-vitamin D 250-100 MG-UNIT tablet Take 1 tablet by mouth 2 (two) times daily. Reported on 06/16/2015   . carvedilol (COREG) 12.5 MG tablet Take 25 mg by mouth 2 (two) times daily with a meal.    . Cholecalciferol (VITAMIN D-3) 1000 UNITS CAPS Take 1 capsule by mouth daily. Reported on 06/16/2015   . ferrous sulfate 325 (65 FE) MG tablet Take 325 mg by mouth daily with breakfast. Reported on 06/16/2015   . gabapentin (NEURONTIN) 300 MG capsule Take 1,800 mg by mouth at bedtime. 06/14/2015: Taking 600 mg each morning and 1800 mg each night at  bedtime.  . hydrochlorothiazide (HYDRODIURIL) 25 MG tablet Take 25 mg by mouth daily.   Marland Kitchen HYDROcodone-acetaminophen (NORCO/VICODIN) 5-325 MG tablet Take 1 tablet by mouth every 4 (four) hours as needed for moderate pain. (Patient not taking: Reported on 06/14/2015)   . ibuprofen (ADVIL,MOTRIN) 600 MG tablet Take 1 tablet by mouth three times daily with meals (Patient not taking: Reported on 06/14/2015)   . levETIRAcetam (KEPPRA) 750 MG tablet Take 1,500 mg by mouth 2 (two) times daily.   Marland Kitchen loratadine (CLARITIN) 10 MG tablet Take 10 mg by mouth at bedtime.   . mirtazapine (REMERON) 15 MG tablet Take 15 mg by mouth at bedtime.   . Multiple Vitamins-Minerals (MULTIVITAMIN WITH MINERALS) tablet Take 1 tablet by mouth daily. Reported on 06/16/2015   . nitroGLYCERIN (NITROSTAT) 0.4 MG SL tablet Place 0.4 mg under the tongue every 5 (five) minutes as needed for chest pain. (may repeat every 5 minutes but seek medical help if pain persists after 3  tablets) 06/16/2015: As needed.   Marland Kitchen omeprazole (PRILOSEC) 40 MG capsule Take 40 mg by mouth daily. 06/16/2015: Pt takes 20 mg twice a day   . ondansetron (ZOFRAN-ODT) 4 MG disintegrating tablet Take 4 mg by mouth every 8 (eight) hours as needed for nausea or vomiting.   Marland Kitchen oxyCODONE (OXY IR/ROXICODONE) 5 MG immediate release tablet Take 5 mg by mouth every 4 (four) hours as needed for severe pain.   . polyethylene glycol (MIRALAX / GLYCOLAX) packet Take 17 g by mouth 2 (two) times daily. Reported on 06/16/2015   . promethazine (PHENERGAN) 25 MG tablet Take 25 mg by mouth.   . QUEtiapine (SEROQUEL) 100 MG tablet Take 100 mg by mouth at bedtime.   . sertraline (ZOLOFT) 100 MG tablet Take 200 mg by mouth at bedtime.    . Thiamine HCl (VITAMIN B-1) 250 MG tablet Take 250 mg by mouth daily. Reported on 06/16/2015   . ursodiol (ACTIGALL) 300 MG capsule Take 300 mg by mouth 2 (two) times daily.   . vitamin B-12 (CYANOCOBALAMIN) 500 MCG tablet Take 500 mcg by mouth daily.  Reported on 06/16/2015   . vitamin E 400 UNIT capsule Take 400 Units by mouth daily. Reported on 06/16/2015    No facility-administered encounter medications on file as of 08/07/2015.    Functional Status:   In your present state of health, do you have any difficulty performing the following activities: 06/16/2015 02/13/2015  Hearing? N N  Vision? N N  Difficulty concentrating or making decisions? N N  Walking or climbing stairs? N Y  Dressing or bathing? N N  Doing errands, shopping? [place] Don[place]  Preparing Food and eating ? Y -  Using the Toilet? N -  In the past six months, have you accidently leaked urine? Y -  Do you have problems with loss of bowel control? N -  Managing your Medications? N -  Managing your Finances? Y -  Housekeeping or managing your Housekeeping? Y -    Fall/Depression Screening:    PHQ 2/9 Scores 06/16/2015 06/06/2015  PHQ - 2 Score 2 4  PHQ- 9 Score - 18    Assessment:  Mrs. Rheault has made progress in her recuperation after her gastric bypass surgery in March 2017, but experiences ongoing problems with nausea.  Mrs. Deichmann has questions about some of her medications.  Mrs. Virk would like to feel less nauseous on a daily basis, and would like to get out more.  Plan:  Mrs. Kierce will continue taking her medications as prescribed and will keep all scheduled provider appointments. Mrs. Than will talk with her medical providers about crushing her medications and administering through her G-tube when she feels too nauseous to take them orally. Mrs. Parmer will discuss the medications that she has questions about with her medical providers. Mrs. Deshmukh will call her insurance company to inquire about obtaining grab bars in her bathroom toilet area. Mrs. Leeder will try to get out of her house on the days she feels good. Mrs. Raser will continue administering her high-protein nutrional supplements through her G-tube as prescribed. Mrs. Ardinger will  continue monitoring and recording her daily weights and blood sugars as she has been.  Next [place] CM in home visit scheduled for next month.  I appreciate the opportunity to participate in Mrs. Phillip's care,  [name], RN, BSN, [place] Coordinator [facility] Care Management  [phone]

## 2015-08-16 DIAGNOSIS — R112 Nausea with vomiting, unspecified: Secondary | ICD-10-CM | POA: Diagnosis not present

## 2015-08-16 DIAGNOSIS — R002 Palpitations: Secondary | ICD-10-CM | POA: Diagnosis not present

## 2015-08-16 DIAGNOSIS — K219 Gastro-esophageal reflux disease without esophagitis: Secondary | ICD-10-CM | POA: Diagnosis not present

## 2015-08-16 DIAGNOSIS — Z905 Acquired absence of kidney: Secondary | ICD-10-CM | POA: Diagnosis not present

## 2015-08-16 DIAGNOSIS — E119 Type 2 diabetes mellitus without complications: Secondary | ICD-10-CM | POA: Diagnosis not present

## 2015-08-16 DIAGNOSIS — Z7982 Long term (current) use of aspirin: Secondary | ICD-10-CM | POA: Diagnosis not present

## 2015-08-16 DIAGNOSIS — Z931 Gastrostomy status: Secondary | ICD-10-CM | POA: Diagnosis not present

## 2015-08-16 DIAGNOSIS — R269 Unspecified abnormalities of gait and mobility: Secondary | ICD-10-CM | POA: Diagnosis not present

## 2015-08-16 DIAGNOSIS — F1210 Cannabis abuse, uncomplicated: Secondary | ICD-10-CM | POA: Diagnosis not present

## 2015-08-16 DIAGNOSIS — E785 Hyperlipidemia, unspecified: Secondary | ICD-10-CM | POA: Diagnosis not present

## 2015-08-16 DIAGNOSIS — Z7984 Long term (current) use of oral hypoglycemic drugs: Secondary | ICD-10-CM | POA: Diagnosis not present

## 2015-08-16 DIAGNOSIS — R1011 Right upper quadrant pain: Secondary | ICD-10-CM | POA: Diagnosis not present

## 2015-08-16 DIAGNOSIS — R1310 Dysphagia, unspecified: Secondary | ICD-10-CM | POA: Diagnosis not present

## 2015-08-16 DIAGNOSIS — Z79899 Other long term (current) drug therapy: Secondary | ICD-10-CM | POA: Diagnosis not present

## 2015-08-16 DIAGNOSIS — R55 Syncope and collapse: Secondary | ICD-10-CM | POA: Diagnosis not present

## 2015-08-16 DIAGNOSIS — R5383 Other fatigue: Secondary | ICD-10-CM | POA: Diagnosis not present

## 2015-08-16 DIAGNOSIS — K9429 Other complications of gastrostomy: Secondary | ICD-10-CM | POA: Diagnosis not present

## 2015-08-16 DIAGNOSIS — M47819 Spondylosis without myelopathy or radiculopathy, site unspecified: Secondary | ICD-10-CM | POA: Diagnosis not present

## 2015-08-16 DIAGNOSIS — Z9884 Bariatric surgery status: Secondary | ICD-10-CM | POA: Diagnosis not present

## 2015-08-16 DIAGNOSIS — R160 Hepatomegaly, not elsewhere classified: Secondary | ICD-10-CM | POA: Diagnosis not present

## 2015-08-16 DIAGNOSIS — I10 Essential (primary) hypertension: Secondary | ICD-10-CM | POA: Diagnosis not present

## 2015-08-16 DIAGNOSIS — E860 Dehydration: Secondary | ICD-10-CM | POA: Diagnosis not present

## 2015-08-16 DIAGNOSIS — R1012 Left upper quadrant pain: Secondary | ICD-10-CM | POA: Diagnosis not present

## 2015-08-16 DIAGNOSIS — F331 Major depressive disorder, recurrent, moderate: Secondary | ICD-10-CM | POA: Diagnosis not present

## 2015-08-16 DIAGNOSIS — F4310 Post-traumatic stress disorder, unspecified: Secondary | ICD-10-CM | POA: Diagnosis not present

## 2015-08-16 DIAGNOSIS — G4733 Obstructive sleep apnea (adult) (pediatric): Secondary | ICD-10-CM | POA: Diagnosis not present

## 2015-08-17 DIAGNOSIS — R9431 Abnormal electrocardiogram [ECG] [EKG]: Secondary | ICD-10-CM | POA: Diagnosis not present

## 2015-08-17 DIAGNOSIS — Z9884 Bariatric surgery status: Secondary | ICD-10-CM | POA: Diagnosis not present

## 2015-08-17 DIAGNOSIS — R110 Nausea: Secondary | ICD-10-CM | POA: Diagnosis not present

## 2015-08-17 DIAGNOSIS — R112 Nausea with vomiting, unspecified: Secondary | ICD-10-CM | POA: Diagnosis not present

## 2015-08-17 DIAGNOSIS — R1310 Dysphagia, unspecified: Secondary | ICD-10-CM | POA: Diagnosis not present

## 2015-08-17 DIAGNOSIS — Z931 Gastrostomy status: Secondary | ICD-10-CM | POA: Diagnosis not present

## 2015-08-17 DIAGNOSIS — R55 Syncope and collapse: Secondary | ICD-10-CM | POA: Diagnosis not present

## 2015-08-17 DIAGNOSIS — I4589 Other specified conduction disorders: Secondary | ICD-10-CM | POA: Diagnosis not present

## 2015-08-18 DIAGNOSIS — R110 Nausea: Secondary | ICD-10-CM | POA: Diagnosis not present

## 2015-08-18 DIAGNOSIS — F419 Anxiety disorder, unspecified: Secondary | ICD-10-CM | POA: Diagnosis not present

## 2015-08-18 DIAGNOSIS — F1221 Cannabis dependence, in remission: Secondary | ICD-10-CM | POA: Insufficient documentation

## 2015-08-18 DIAGNOSIS — R55 Syncope and collapse: Secondary | ICD-10-CM | POA: Diagnosis not present

## 2015-08-18 DIAGNOSIS — Z9884 Bariatric surgery status: Secondary | ICD-10-CM | POA: Diagnosis not present

## 2015-08-18 DIAGNOSIS — R112 Nausea with vomiting, unspecified: Secondary | ICD-10-CM | POA: Diagnosis not present

## 2015-08-18 DIAGNOSIS — F4310 Post-traumatic stress disorder, unspecified: Secondary | ICD-10-CM | POA: Diagnosis not present

## 2015-08-18 DIAGNOSIS — F331 Major depressive disorder, recurrent, moderate: Secondary | ICD-10-CM | POA: Diagnosis not present

## 2015-08-18 DIAGNOSIS — R1310 Dysphagia, unspecified: Secondary | ICD-10-CM | POA: Diagnosis not present

## 2015-08-19 DIAGNOSIS — R55 Syncope and collapse: Secondary | ICD-10-CM | POA: Diagnosis not present

## 2015-08-19 DIAGNOSIS — R112 Nausea with vomiting, unspecified: Secondary | ICD-10-CM | POA: Diagnosis not present

## 2015-08-19 DIAGNOSIS — Z9884 Bariatric surgery status: Secondary | ICD-10-CM | POA: Diagnosis not present

## 2015-08-19 DIAGNOSIS — R1310 Dysphagia, unspecified: Secondary | ICD-10-CM | POA: Diagnosis not present

## 2015-08-20 DIAGNOSIS — R1310 Dysphagia, unspecified: Secondary | ICD-10-CM | POA: Diagnosis not present

## 2015-08-20 DIAGNOSIS — Z9884 Bariatric surgery status: Secondary | ICD-10-CM | POA: Diagnosis not present

## 2015-08-20 DIAGNOSIS — R112 Nausea with vomiting, unspecified: Secondary | ICD-10-CM | POA: Diagnosis not present

## 2015-08-21 DIAGNOSIS — R1310 Dysphagia, unspecified: Secondary | ICD-10-CM | POA: Diagnosis not present

## 2015-08-21 DIAGNOSIS — Z9884 Bariatric surgery status: Secondary | ICD-10-CM | POA: Diagnosis not present

## 2015-08-21 DIAGNOSIS — R112 Nausea with vomiting, unspecified: Secondary | ICD-10-CM | POA: Diagnosis not present

## 2015-08-22 DIAGNOSIS — E860 Dehydration: Secondary | ICD-10-CM | POA: Diagnosis not present

## 2015-08-25 DIAGNOSIS — K9509 Other complications of gastric band procedure: Secondary | ICD-10-CM | POA: Diagnosis not present

## 2015-08-25 DIAGNOSIS — R1310 Dysphagia, unspecified: Secondary | ICD-10-CM | POA: Diagnosis not present

## 2015-08-31 ENCOUNTER — Other Ambulatory Visit: Payer: Self-pay | Admitting: *Deleted

## 2015-08-31 NOTE — Patient Outreach (Signed)
[place] [facility]) Care Management [place] Telephone Outreach, Care Coordination 08/31/2015  Denise Davis 1965/06/21 [id number]   Successful telephone outreach after 2 attempts to Denise Filler, RN CM with [facility] [phone]; Denise Davis had called and left a voicemail message on my phone earlier this week asking me to return her call in regard to Denise Davis, 50 y.o. female followed by [facility] initially for transition of care after IP hospital discharge on June 13, 2015. Denise Davis reported that she wanted to update me on follow up of Denise Davis, as she has been following patient for transition of care after each hospital discharge.  Denise Davis reports that patient was again admitted to [facility] for dehydration and malnutrition May 17-23, 2017.  Denise Davis reports that she is currently following patient for transition of care needs evaluation after most recent discharge.  Denise Davis stated that the discharge instructions for patient direct patient to take (4) 4 ounce cans of Osmolyte 1.5 supplement feeding via tube feeding, with free water, while patient reports only getting 90 mls twice daily.  Other concerns that Denise Davis shared were that at the time of discharge, patient regular Keppra regimen was discontinued.  Denise Davis instructed patient to contact her neurology provider to make aware/ obtain follow up appointment.  Patient had gastric-bypass done at [facility].Since her discharge home, Denise Davis reported during initial [facility] home visit on Aug 07, 2015, that she has experienced ongoing nausea, sometimes associated with vomiting, on a regular basis, that she specifically associates with administration of her tube feedings. Denise Davis stated that the ongoing nausea makes it difficult to eat and sometimes to take her medications.  Plan: [place] routine home visit planned for next week; will outreach to  patient by telephone prior to visit, as Denise Davis reported that patient had a scheduled provider appointment on same day as scheduled home visit.  Denise Rack, RN, BSN, [employer] [facility] Care Management  [phone]

## 2015-09-01 ENCOUNTER — Other Ambulatory Visit: Payer: Self-pay | Admitting: *Deleted

## 2015-09-01 DIAGNOSIS — Z931 Gastrostomy status: Secondary | ICD-10-CM | POA: Diagnosis not present

## 2015-09-01 DIAGNOSIS — Z9884 Bariatric surgery status: Secondary | ICD-10-CM | POA: Diagnosis not present

## 2015-09-01 DIAGNOSIS — R112 Nausea with vomiting, unspecified: Secondary | ICD-10-CM | POA: Diagnosis not present

## 2015-09-01 DIAGNOSIS — K5900 Constipation, unspecified: Secondary | ICD-10-CM | POA: Diagnosis not present

## 2015-09-01 NOTE — Patient Outreach (Signed)
[facility]) Care Management [place] Telephone Outreach, Transition of Care, Day 1 09/01/2015  Denise Davis [birth year] [id number]   [name], 50 y.o. female followed by [facility] initially for transition of care after IP hospital discharge on June 13, 2015. [facility] Community RN CM made aware yesterday by [place] CM that patient had been re-admitted to [facility] from May 17-23, 2017 for dehydration and malnutrition.  Patient is now followed by [place] for transition of care after most recent hospital discharge.    Patient had gastric-bypass done at [facility] on May 28, 2015, and a gastrostomy was placed at that time.Since her discharge home after the initial surgery, Denise Davis has reported ongoing nausea, sometimes associated with vomiting, on a regular basis, that she specifically associates with administration of her tube feedings. Mrs. Davis stated that the ongoing nausea makes it difficult to eat and sometimes to take her medications.  Today, patient reports that she "feels good."  Denise Davis states that she has been "doing the best" she can with taking her "high protein" tube feedings the way she is supposed to.  Denise Davis reports that she is supposed to be taking (3) 8-ounce cans per day;  Patient states that "sometimes she can, sometimes she can't," again citing intermittent ongoing nausea with tube feed administration.  Patient reports that she visited her "GI surgeon that did her Gastric bypass," this morning.  Patient stated that her surgeon has told her that the nausea she is experiencing with tube feedings since her gastric bypass "is normal," but that with "most patients, it doesn't last as long as it has" with Denise Davis. Patient states that she has attended all of her provider appointments since her surgery, and voices plans to continue following up with providers as scheduled.  Denise Davis states  that she is in the process of getting a new PCP, Denise Davis with [facility], stating she has her first appointment with Dr. Caryl Bis on October 04, 2015.  Denise Davis stated that she is taking her medications as prescribed since her discharge home from the hospital.  Denise Davis states that she "is not sure" why her Keppra was discontinued at discharge on Aug 22, 2015. I advised to contact her neurology provider to make aware/ obtain follow up appointment.  Discussed upcoming previously scheduled [place] CM in home appointment on Monday, September 04, 2015, and patient stated, "Oh I forgot about that!  I won't be here at home; my husband is taking me on vacation."  Denise Davis stated that she needed to cancel the previously scheduled in-home visit, but was not sure when she could re-schedule.  We verbally contracted that I would call Denise Davis next week, and would re-schedule [facility] Community CM in-home visit then.  Plan:  Denise Davis will continue taking her medications as prescribed and will keep all scheduled provider appointments.  Denise Davis will make an appointment with her neurologist and will discuss her Keppra being discontinued at the time of her hospital discharge.  [place] will continue following for transition of care with telephone outreach scheduled for next week.  [name], RN, BSN, [employer] [facility] Care Management  [phone]

## 2015-09-04 ENCOUNTER — Ambulatory Visit: Payer: Self-pay | Admitting: *Deleted

## 2015-09-07 ENCOUNTER — Other Ambulatory Visit: Payer: Self-pay | Admitting: *Deleted

## 2015-09-07 NOTE — Patient Outreach (Signed)
[place] [facility]) Care Management [place] Telephone Outreach, Transition of Care, day 7 09/07/2015  Denise Davis 1965/04/18 [id number]  Denise Davis, 50 y.o. female followed by [facility] initially for transition of care after IP hospital discharge from [facility] on June 13, 2015.   On August 31, 2015, [facility] Community RN CM was made aware by [place] CM that patient had been re-admitted to [facility] from May 17-23, 2017 for dehydration and malnutrition.  Patient is now followed by [place] for transition of care after most recent hospital discharge.    Patient had gastric-bypass done at [facility] on May 28, 2015, and a gastrostomy tube was placed at that time. Since her discharge home after the initial surgery, Mrs. Denise Davis has reported ongoing nausea, sometimes associated with vomiting, on a regular basis, that she specifically associates with administration of her tube feedings.  Mrs. Denise Davis stated that the ongoing nausea makes it difficult to eat and sometimes to take her medications.  Today, patient reports that she continues to "feels somewhat better."  Ms. Denise Davis states that she has just arrived home from her trip that her husband took her on last week, and stated that she was able to "get out and walk" and that made her feel "really good."  Ms. Denise Davis states that there is "no real change" in her eating, reporting that she "tries to eat like they tell me to," but again acknowledges that she can't take as much of the protein tube feedings as her doctors have prescribed, due to ongoing nausea and a feeling of fullness with tube feed administration.  Mrs. Denise Davis states, "I do the best I can."  Mrs. Denise Davis estimates that she "takes 2 or 3"  (8)-ounce cans every day, when they want her having 4 (8) ounce cans per day.  Ms. Denise Davis states that she is continuing to be followed by her GI surgeon who she reports is aware  that she is not taking as much of the tube feedings as she has been prescribed.  Mrs. Denise Davis states that she will be following up with the surgeon regularly for further evaluation and instruction.  Patient states that she has attended all of her provider appointments since her surgery, and voices plans to continue following up with providers as scheduled.  Ms. Denise Davis states that she will be visiting her new PCP, Dr. Tommi Rumps with [facility] for her first appointment on October 02, 2015.  Ms. Denise Davis stated that she is taking her medications as prescribed since her discharge home from the hospital.  Ms. Denise Davis again states that she "is not sure" why her Keppra was discontinued at discharge on Aug 22, 2015, but confirms that she contacted her neurology provider and made aware and also obtained a follow up appointment "for sometime in July."  Mrs. Denise Davis said that for now, her neurology provider instructed her to stay off of [place] until she has her scheduled office visit.  Mrs. Denise Davis stated that she would like to wait to schedule a [place] CM in-home visit until she has more time to look at her calendar.  I shared with patient my general schedule for the next several weeks, and we agreed to make [place] in home appointment the next we talk by phone.  Plan:  Ms. Denise Davis will continue taking her medications as prescribed and will keep all scheduled provider appointments.  Ms. Denise Davis will continue to contact her providers  for any new concerns, questions, issues, or problems that arise.  [place] will continue following for transition of care with telephone outreach next week.  [name], RN, BSN, [employer] [facility] Care Management  [phone]

## 2015-09-15 DIAGNOSIS — F410 Panic disorder [episodic paroxysmal anxiety] without agoraphobia: Secondary | ICD-10-CM | POA: Diagnosis not present

## 2015-09-15 DIAGNOSIS — M25562 Pain in left knee: Secondary | ICD-10-CM | POA: Diagnosis not present

## 2015-09-15 DIAGNOSIS — Z79899 Other long term (current) drug therapy: Secondary | ICD-10-CM | POA: Diagnosis not present

## 2015-09-15 DIAGNOSIS — Z7984 Long term (current) use of oral hypoglycemic drugs: Secondary | ICD-10-CM | POA: Diagnosis not present

## 2015-09-15 DIAGNOSIS — G4733 Obstructive sleep apnea (adult) (pediatric): Secondary | ICD-10-CM | POA: Diagnosis not present

## 2015-09-15 DIAGNOSIS — F1221 Cannabis dependence, in remission: Secondary | ICD-10-CM | POA: Diagnosis not present

## 2015-09-15 DIAGNOSIS — E119 Type 2 diabetes mellitus without complications: Secondary | ICD-10-CM | POA: Diagnosis not present

## 2015-09-15 DIAGNOSIS — Z7982 Long term (current) use of aspirin: Secondary | ICD-10-CM | POA: Diagnosis not present

## 2015-09-15 DIAGNOSIS — F40248 Other situational type phobia: Secondary | ICD-10-CM | POA: Diagnosis not present

## 2015-09-15 DIAGNOSIS — F329 Major depressive disorder, single episode, unspecified: Secondary | ICD-10-CM | POA: Diagnosis not present

## 2015-09-15 DIAGNOSIS — Z09 Encounter for follow-up examination after completed treatment for conditions other than malignant neoplasm: Secondary | ICD-10-CM | POA: Diagnosis not present

## 2015-09-15 DIAGNOSIS — M25561 Pain in right knee: Secondary | ICD-10-CM | POA: Diagnosis not present

## 2015-09-15 DIAGNOSIS — I10 Essential (primary) hypertension: Secondary | ICD-10-CM | POA: Diagnosis not present

## 2015-09-15 DIAGNOSIS — M1990 Unspecified osteoarthritis, unspecified site: Secondary | ICD-10-CM | POA: Diagnosis not present

## 2015-09-15 DIAGNOSIS — E785 Hyperlipidemia, unspecified: Secondary | ICD-10-CM | POA: Diagnosis not present

## 2015-09-15 DIAGNOSIS — K219 Gastro-esophageal reflux disease without esophagitis: Secondary | ICD-10-CM | POA: Diagnosis not present

## 2015-09-15 DIAGNOSIS — Z9884 Bariatric surgery status: Secondary | ICD-10-CM | POA: Diagnosis not present

## 2015-09-15 DIAGNOSIS — F458 Other somatoform disorders: Secondary | ICD-10-CM | POA: Diagnosis not present

## 2015-09-15 DIAGNOSIS — Z9889 Other specified postprocedural states: Secondary | ICD-10-CM | POA: Diagnosis not present

## 2015-09-15 DIAGNOSIS — Z86718 Personal history of other venous thrombosis and embolism: Secondary | ICD-10-CM | POA: Diagnosis not present

## 2015-09-15 DIAGNOSIS — Z1321 Encounter for screening for nutritional disorder: Secondary | ICD-10-CM | POA: Diagnosis not present

## 2015-09-15 DIAGNOSIS — Z85528 Personal history of other malignant neoplasm of kidney: Secondary | ICD-10-CM | POA: Diagnosis not present

## 2015-09-15 DIAGNOSIS — G4700 Insomnia, unspecified: Secondary | ICD-10-CM | POA: Diagnosis not present

## 2015-09-15 DIAGNOSIS — Z8249 Family history of ischemic heart disease and other diseases of the circulatory system: Secondary | ICD-10-CM | POA: Diagnosis not present

## 2015-09-15 DIAGNOSIS — Z905 Acquired absence of kidney: Secondary | ICD-10-CM | POA: Diagnosis not present

## 2015-09-20 ENCOUNTER — Other Ambulatory Visit: Payer: Self-pay | Admitting: *Deleted

## 2015-09-20 NOTE — Patient Outreach (Signed)
[facility]) Care Management [place] Telephone Outreach, Transition of Care day 20 09/20/2015  GELISSA SCHMITH [birth year] [id number]  Successful telephone outreach to "Isaias Sakai, 50 y/o female followed by [facility] initially for transition of care after IP hospital discharge from [facility] on June 13, 2015. On August 31, 2015, [facility] Community RN CM was made aware by [place] CM that patient had been re-admitted to [facility] from May 17-23, 2017 for dehydration and malnutrition. Patient is now followed by [place] for transition of care after most recent hospital discharge.   Patient had gastric-bypass done at [facility] on May 28, 2015, and a gastrostomy tube was placed at that time.Since her discharge home after the initial surgery, Mrs. Miedema has reported ongoing nausea, sometimes associated with vomiting, on a regular basis, which she specifically associates with administration of her tube feedings. Mrs. Mccallen stated that the ongoing nausea makes it difficult to eat and sometimes to take her medications.  Today, Tressy states that she is "doing pretty good."  Lynnon reports that she continues to experience issues around "eating better."  Calli stated that one day last week, she tried to eat "as much as they want me to," and stated that she became ill, and had diarrhea "all day."  Tameyah stated that she remains "sick" on her stomach "all the time."  Although we discussed various options Cynthina could employ to improve her nutrition, she verbalized that she does not believe that "anything will work."  Kutina again stated that her doctors are aware of this ongoing issue she is experiencing around her nutritional issues.  Lameca reports that she is taking her medications as they are prescribed, and has attended all of her scheduled provider appointments.  Today we were able to re-schedule our last home visit, which  Maesa cancelled around vacation she had planned.  Anaisa agreed to have her medications and provider appointments ready for review during our home visit, scheduled for next week.  Plan: Ms. Shackelton will continue taking her medications as prescribed and will keep all scheduled provider appointments.  Ms. Bryner will continue to contact her providers for any new concerns, questions, issues, or problems that arise.  [place] will continue following for transition of care with home visit scheduled for next week.   [name], RN, BSN, [employer] [facility] Care Management  [phone]

## 2015-09-21 ENCOUNTER — Other Ambulatory Visit: Payer: Self-pay

## 2015-09-21 NOTE — Patient Outreach (Signed)
Unsuccessful attempt made to contact patient via telephone to assess transition of care needs.

## 2015-09-22 DIAGNOSIS — F5089 Other specified eating disorder: Secondary | ICD-10-CM | POA: Diagnosis not present

## 2015-09-27 ENCOUNTER — Encounter: Payer: Self-pay | Admitting: *Deleted

## 2015-09-27 ENCOUNTER — Other Ambulatory Visit: Payer: Self-pay | Admitting: *Deleted

## 2015-09-27 NOTE — Patient Outreach (Addendum)
[facility] [facility]) Care Management  [facility] Community CM Routine Home visit, Transition of Care day 27 09/27/2015  Denise Davis 02/17/1966 [id number]  Denise Davis is an 50 y.o. female 50 y/o female followed by [place] initially for transition of care after IP hospital discharge from [facility] on June 13, 2015.   On August 31, 2015, [facility] Community RN CM was made aware by [place] CM that patient had been re-admitted to [facility] from May 17-23, 2017 for dehydration and malnutrition.  Patient is now followed by [place] for transition of care after most recent hospital discharge, as well as for ongoing nutritional deficits.    Patient had gastric-bypass done at [facility] on May 28, 2015, and a gastrostomy tube was placed at that time. Since her discharge home after the initial surgery, Denise Davis has reported ongoing nausea, sometimes associated with vomiting, on a regular basis, which she specifically associates with administration of her tube feedings.  Denise Davis stated that the ongoing nausea makes it difficult to eat and sometimes to take her medications due to a feeling of "fullness," where she states she "can't get anything in my tube or down my throat.... I just can't eat."  Today, Denise Davis states that she is "okay."  Denise Davis reports that she continues to experience issues around "eating better."  Denise Davis stated that she has had no success in "eating more" or with increasing her prescribed nutritional supplementation through her G-tube.  Denise Davis reports that "they want me to take in 4 (8) oz cans every day, but I can only take 2, without getting sick."  Denise Davis stated that she remains "sick" on her stomach "with a full feeling, all the time" when she tries to increase her food/ protein supplement intake. Denise Davis again stated that her doctors are aware of this ongoing issue she is experiencing around her nutritional issues.  Denise Davis reports  that she believes she is taking in "enough water," and also that she attended a visit to the [facility] in [place], where she was told by the doctor that she "needs to increase" her nutritional intake, but states that no viable options for doing so were presented to her, which only frustrated her.  Denise Davis stated that her surgeon suggested "reversing the surgery," which she "completely disagreed with."  Today, we developed a plan where Denise Davis will attempt to add one additional 8-oz can to her regular 2-cans of daily G-tube feedings, using small amounts, over the course of the day.  Denise Davis was agreeable to try this plan.  Denise Davis reports that she is taking her medications as they are prescribed, and has attended all of her scheduled provider appointments.  Denise Davis states that she is having an issue with her insurance company paying for her provider appointments and medications "because they are not in network."  Denise Davis states that she has begun switching providers, and has her initial office visit with her new PCP "this Monday on October 02, 2015."  Denise Davis states that she "is about to run out of a lot of my medications, so I hope this all gets worked out when I see Dr. Biagio Quint on Monday."  Patient also states that she was placed on Metformin by her doctors at [facility], which is not on her medication list upon review; patient states that she is taking Metformin 1000 mg po BID.  [name] reports that she checks her blood sugar levels every morning, and states that "  they are in the 100's."  Denise Davis checked her sugar today, and it was 131, fasting.    Denise Davis reports today that she has been trying to get outside more, as we have discussed during previous [place] outreach, however, "some days I am able, and others, I am not, I just feel too bad."  Subjective:  "I would like to be able to eat better, the way they want me to, but I just can't."  Objective:    BP 103/81 mmHg  Pulse 81  Resp 18   Wt 290 lb (131.543 kg)  SpO2 94%   Review of Systems  Constitutional: Positive for weight loss and malaise/fatigue.       Patient had gastric bypass surgery in January 2017, and has lost 122 pounds since then  HENT: Negative.   Respiratory: Negative for cough, shortness of breath and wheezing.   Cardiovascular: Negative for chest pain and leg swelling.  Gastrointestinal: Positive for nausea and constipation. Negative for abdominal pain.  Genitourinary: Negative.   Musculoskeletal: Positive for joint pain and falls.       Bilateral knee pain as baseline  Neurological: Positive for dizziness. Negative for weakness.       Patient reports intermittent dizziness as her baseline  Psychiatric/Behavioral: Positive for depression. The patient is not nervous/anxious.     Physical Exam  Constitutional: She is oriented to person, place, and time. She appears well-developed and well-nourished.  Cardiovascular: Normal rate, regular rhythm, normal heart sounds and intact distal pulses.   Pulses:      Radial pulses are 2+ on the right side, and 2+ on the left side.       Dorsalis pedis pulses are 2+ on the right side, and 2+ on the left side.  Respiratory: Effort normal and breath sounds normal. No respiratory distress. She has no wheezes. She has no rales.  GI: Soft. Bowel sounds are normal.  Musculoskeletal: She exhibits no edema.  Neurological: She is alert and oriented to person, place, and time.  Skin: Skin is warm and dry.  Psychiatric: She has a normal mood and affect. Her behavior is normal. Judgment and thought content normal.    Encounter Medications:   Outpatient Encounter Prescriptions as of 09/27/2015  Medication Sig Note  . albuterol (PROVENTIL HFA) 108 (90 BASE) MCG/ACT inhaler Inhale 2 puffs into the lungs every 4 (four) hours as needed for wheezing or shortness of breath. 09/27/2015: Has not needed recently   . amLODipine (NORVASC) 10 MG tablet Take 1 tablet (10 mg total) by mouth  daily.   Marland Kitchen aspirin EC 81 MG tablet Take 81 mg by mouth daily.   . calcium-vitamin D 250-100 MG-UNIT tablet Take 1 tablet by mouth 2 (two) times daily. Reported on 06/16/2015   . carvedilol (COREG) 12.5 MG tablet Take 25 mg by mouth 2 (two) times daily with a meal.    . Cholecalciferol (VITAMIN D-3) 1000 UNITS CAPS Take 1 capsule by mouth daily. Reported on 06/16/2015   . ferrous sulfate 325 (65 FE) MG tablet Take 325 mg by mouth daily with breakfast. Reported on 06/16/2015 09/27/2015: Takes only as needed, per patient report  . gabapentin (NEURONTIN) 300 MG capsule Take 1,800 mg by mouth at bedtime. 06/14/2015: Taking 600 mg each morning and 1800 mg each night at bedtime.  . hydrochlorothiazide (HYDRODIURIL) 25 MG tablet Take 25 mg by mouth daily.   Marland Kitchen HYDROcodone-acetaminophen (NORCO/VICODIN) 5-325 MG tablet Take 1 tablet by mouth every 4 (four) hours as  needed for moderate pain.   Marland Kitchen loratadine (CLARITIN) 10 MG tablet Take 10 mg by mouth at bedtime. 09/27/2015: Patient states she has run out of this medication  . mirtazapine (REMERON) 15 MG tablet Take 15 mg by mouth at bedtime.   . Multiple Vitamins-Minerals (MULTIVITAMIN WITH MINERALS) tablet Take 1 tablet by mouth daily. Reported on 06/16/2015   . nitroGLYCERIN (NITROSTAT) 0.4 MG SL tablet Place 0.4 mg under the tongue every 5 (five) minutes as needed for chest pain. (may repeat every 5 minutes but seek medical help if pain persists after 3 tablets) 09/27/2015: Patient has not needed "in months"   . omeprazole (PRILOSEC) 40 MG capsule Take 40 mg by mouth daily. 06/16/2015: Pt takes 20 mg twice a day   . ondansetron (ZOFRAN-ODT) 4 MG disintegrating tablet Take 4 mg by mouth every 8 (eight) hours as needed for nausea or vomiting.   Marland Kitchen oxyCODONE (OXY IR/ROXICODONE) 5 MG immediate release tablet Take 5 mg by mouth every 4 (four) hours as needed for severe pain. Reported on 08/07/2015 08/07/2015: Taking Vicodin instead for pain  . polyethylene glycol (MIRALAX /  GLYCOLAX) packet Take 17 g by mouth 2 (two) times daily. Reported on 08/07/2015   . promethazine (PHENERGAN) 25 MG tablet Take 25 mg by mouth. Reported on 08/07/2015 09/27/2015: Medication was re-started with last hospitalization at [facility]   . QUEtiapine (SEROQUEL) 100 MG tablet Take 100 mg by mouth at bedtime. Reported on 08/07/2015 08/07/2015: Not taking; change to mirtazapine  . sertraline (ZOLOFT) 100 MG tablet Take 200 mg by mouth at bedtime.    . Thiamine HCl (VITAMIN B-1) 250 MG tablet Take 250 mg by mouth daily. Reported on 06/16/2015   . ursodiol (ACTIGALL) 300 MG capsule Take 300 mg by mouth 2 (two) times daily.   . vitamin B-12 (CYANOCOBALAMIN) 500 MCG tablet Take 500 mcg by mouth daily. Reported on 06/16/2015   . vitamin E 400 UNIT capsule Take 400 Units by mouth daily. Reported on 06/16/2015   . ibuprofen (ADVIL,MOTRIN) 600 MG tablet Take 1 tablet by mouth three times daily with meals (Patient not taking: Reported on 06/14/2015) 08/07/2015: "I am not supposed to take that bc I only have one kidney, they told me not to take any NSAIDS."  . levETIRAcetam (KEPPRA) 750 MG tablet Take 1,500 mg by mouth 2 (two) times daily. Reported on 09/27/2015 09/27/2015: Was discontinued by Dr. Rollene Rotunda, pt.'s neurologist in [facility], [place]; patient states follow up visit scheduled with him Sept 2017    No facility-administered encounter medications on file as of 09/27/2015.    Functional Status:   In your present state of health, do you have any difficulty performing the following activities: 06/16/2015 02/13/2015  Hearing? N N  Vision? N N  Difficulty concentrating or making decisions? N N  Walking or climbing stairs? N Y  Dressing or bathing? N N  Doing errands, shopping? Denise Davis  Preparing Food and eating ? Y -  Using the Toilet? N -  In the past six months, have you accidently leaked urine? Y -  Do you have problems with loss of bowel control? N -  Managing your Medications? N -  Managing your Finances? Y  -  Housekeeping or managing your Housekeeping? Y -    Fall/Depression Screening:    PHQ 2/9 Scores 09/27/2015 06/16/2015 06/06/2015  PHQ - 2 Score '2 2 4  '$ PHQ- 9 Score 8 - 18    Assessment:  Although Denise Davis has  almost met her goal of not being re-admitted to the hospital within 31 days of her last IP discharge, Denise Davis has made very slow progress in her recuperation after her gastric bypass surgery in January 2017, and experiences ongoing problems with nausea, where she reports she can not eat due to feeling of GI fullness.  Denise Davis is frustrated and depressed that she has not made better progress after her surgery.  Denise Davis would like to feel less nauseous on a daily basis, would like to increase her nutritional intake, and would like to get out for conservative activity more regularly.  Plan:   Maelle will continue taking her medications as prescribed and will keep all scheduled provider appointments.  Modine will begin attempting to increase her daily protein supplementation by one 8 oz can per day, going from current 2 cans/ day up to 3; she will administer this additional one can slowly and gradually, over the course of each day.  Saryiah will continue to go outside for conservative activity on the days that she feels good.  [place] will continue to contact her providers for any new concerns, questions, issues, or problems that arise.  [place] will continue following for ongoing nutritional assessment/ evaluation with home visit scheduled for next month.   [name], RN, BSN, [employer] [facility] Care Management  [phone]

## 2015-10-02 ENCOUNTER — Ambulatory Visit (INDEPENDENT_AMBULATORY_CARE_PROVIDER_SITE_OTHER): Payer: PPO | Admitting: Family Medicine

## 2015-10-02 VITALS — BP 136/84 | HR 91 | Temp 98.2°F | Ht 64.5 in | Wt 294.2 lb

## 2015-10-02 DIAGNOSIS — M170 Bilateral primary osteoarthritis of knee: Secondary | ICD-10-CM

## 2015-10-02 DIAGNOSIS — G40909 Epilepsy, unspecified, not intractable, without status epilepticus: Secondary | ICD-10-CM

## 2015-10-02 DIAGNOSIS — I10 Essential (primary) hypertension: Secondary | ICD-10-CM | POA: Diagnosis not present

## 2015-10-02 DIAGNOSIS — E119 Type 2 diabetes mellitus without complications: Secondary | ICD-10-CM | POA: Diagnosis not present

## 2015-10-02 DIAGNOSIS — F418 Other specified anxiety disorders: Secondary | ICD-10-CM

## 2015-10-02 DIAGNOSIS — F419 Anxiety disorder, unspecified: Secondary | ICD-10-CM

## 2015-10-02 DIAGNOSIS — F32A Depression, unspecified: Secondary | ICD-10-CM

## 2015-10-02 DIAGNOSIS — F329 Major depressive disorder, single episode, unspecified: Secondary | ICD-10-CM

## 2015-10-02 DIAGNOSIS — Z9884 Bariatric surgery status: Secondary | ICD-10-CM

## 2015-10-02 MED ORDER — CARVEDILOL 25 MG PO TABS: 25.0000 mg | ORAL_TABLET | Freq: Two times a day (BID) | ORAL | Status: DC

## 2015-10-02 MED ORDER — VITAMIN D-3 25 MCG (1000 UT) PO CAPS: 1.0000 | ORAL_CAPSULE | Freq: Every day | ORAL | Status: AC

## 2015-10-02 MED ORDER — LORATADINE 10 MG PO TABS: 10.0000 mg | ORAL_TABLET | Freq: Every day | ORAL | Status: DC

## 2015-10-02 MED ORDER — VITAMIN B-12 500 MCG PO TABS: 500.0000 ug | ORAL_TABLET | Freq: Every day | ORAL | Status: DC

## 2015-10-02 MED ORDER — VITAMIN B-1 250 MG PO TABS: 250.0000 mg | ORAL_TABLET | Freq: Every day | ORAL | Status: DC

## 2015-10-02 MED ORDER — QUETIAPINE FUMARATE 100 MG PO TABS: ORAL_TABLET | ORAL | Status: DC

## 2015-10-02 MED ORDER — ASPIRIN EC 81 MG PO TBEC: 81.0000 mg | DELAYED_RELEASE_TABLET | Freq: Every day | ORAL | Status: DC

## 2015-10-02 MED ORDER — OMEPRAZOLE 40 MG PO CPDR: 40.0000 mg | DELAYED_RELEASE_CAPSULE | Freq: Every day | ORAL | Status: DC

## 2015-10-02 MED ORDER — URSODIOL 300 MG PO CAPS: 300.0000 mg | ORAL_CAPSULE | Freq: Two times a day (BID) | ORAL | Status: DC

## 2015-10-02 MED ORDER — VITAMIN E 180 MG (400 UNIT) PO CAPS: 400.0000 [IU] | ORAL_CAPSULE | Freq: Every day | ORAL | Status: DC

## 2015-10-02 MED ORDER — AMLODIPINE BESYLATE 10 MG PO TABS: 10.0000 mg | ORAL_TABLET | Freq: Every day | ORAL | Status: DC

## 2015-10-02 MED ORDER — GABAPENTIN 300 MG PO CAPS: 2400.0000 mg | ORAL_CAPSULE | Freq: Every day | ORAL | Status: DC

## 2015-10-02 MED ORDER — ONDANSETRON 4 MG PO TBDP: 4.0000 mg | ORAL_TABLET | Freq: Three times a day (TID) | ORAL | Status: DC | PRN

## 2015-10-02 MED ORDER — NITROGLYCERIN 0.4 MG SL SUBL: 0.4000 mg | SUBLINGUAL_TABLET | SUBLINGUAL | Status: DC | PRN

## 2015-10-02 MED ORDER — HYDROCHLOROTHIAZIDE 25 MG PO TABS: 25.0000 mg | ORAL_TABLET | Freq: Every day | ORAL | Status: DC

## 2015-10-02 MED ORDER — PROMETHAZINE HCL 25 MG PO TABS: 25.0000 mg | ORAL_TABLET | Freq: Three times a day (TID) | ORAL | Status: DC | PRN

## 2015-10-02 MED ORDER — POLYETHYLENE GLYCOL 3350 17 G PO PACK: 17.0000 g | PACK | Freq: Two times a day (BID) | ORAL | Status: DC

## 2015-10-02 MED ORDER — MIRTAZAPINE 15 MG PO TABS: 15.0000 mg | ORAL_TABLET | Freq: Every day | ORAL | Status: DC

## 2015-10-02 MED ORDER — OXYCODONE HCL 5 MG PO TABS: 5.0000 mg | ORAL_TABLET | ORAL | Status: DC | PRN

## 2015-10-02 MED ORDER — HYDROCODONE-ACETAMINOPHEN 5-325 MG PO TABS: 1.0000 | ORAL_TABLET | ORAL | Status: DC | PRN

## 2015-10-02 MED ORDER — SERTRALINE HCL 100 MG PO TABS
ORAL_TABLET | ORAL | Status: DC
Start: 2015-10-02 — End: 2016-02-12

## 2015-10-02 MED ORDER — ALBUTEROL SULFATE HFA 108 (90 BASE) MCG/ACT IN AERS: 2.0000 | INHALATION_SPRAY | RESPIRATORY_TRACT | Status: DC | PRN

## 2015-10-02 MED ORDER — CALCIUM CITRATE-VITAMIN D 250-100 MG-UNIT PO TABS: 1.0000 | ORAL_TABLET | Freq: Two times a day (BID) | ORAL | Status: DC

## 2015-10-02 NOTE — Patient Instructions (Signed)
Nice to meet you. I have refilled your medications.  We will get you to see a therapist, psychiatrist, general surgeon, and neurologist. If you develop seizures, thoughts of harming herself or others, blood pressure greater than 180/110, persistent blood pressures greater than 140/90, or any new or changing symptoms please seek medical attention.

## 2015-10-02 NOTE — Progress Notes (Signed)
Pre visit review using our clinic review tool, if applicable. No additional management support is needed unless otherwise documented below in the visit note. 

## 2015-10-02 NOTE — Progress Notes (Signed)
Patient ID: Denise Davis, female   DOB: [birth year], 50 y.o.   MRN: [id number]  Denise Rumps, MD Phone: [phone]  Denise Davis is a 50 y.o. female who presents today for new patient visit.  Patient reports recently her insurance changed so they will not cover medicines prescribed by her previous doctors and all of her previous doctors are now out of network.  She reports a history of seizures. Previously has been on Keppra. She describes her seizures as blacking out and then not being able to remember anything after being confused. Only had one episode of shaking. Was occurring 2-3 times a week. First seizure in 2015. Placed on Keppra in 2016. Had not had any seizure since then. Has been off of [place] since May and had follow-up with her neurologist at that time who per review of care everywhere planned to keep her off of [place] at that time. Her neurologist is no longer in her network. She reports she had a significant cardiac workup at the time this first started and this was negative. She needs referral to a new neurologist.  Status post gastric bypass surgery: Patient had gastric bypass surgery in January. Following this she developed nausea and couldn't stand to eat anything. They put a feeding tube in several months later as she did not want to eat. She feels as though she gets choked and nauseous anytime she sees food. Recently saw an eating disorder specialist. They want her to see if therapist. Is doing 2 tube feeds a day.  HYPERTENSION Disease Monitoring Home BP Monitoring 120's/80's typically, highest 146/110 Chest pain- no    Dyspnea- no Medications Compliance-  Taking amlodipine, carvedilol, HCTZ. Lightheadedness-  no  Edema- no  DIABETES Disease Monitoring: Blood Sugar ranges-typically around 110 Polyuria/phagia/dipsia- no      Medications: Compliance- intermittent he takes metformin if sugars greater than 115  She also reports chronic pain in her knees related to  bone on bone disease. As previously discussed knee replacements. Takes oxycodone daily for this and Norco for breakthrough pain. Typically only takes the oxycodone when she goes for a walk.   Active Ambulatory Problems    Diagnosis Date Noted  . Diabetes mellitus ([place])   . Hypertension   . Pleuritic chest pain 05/09/2011  . Wheezing 08/16/2012  . Shortness of breath 08/16/2012  . Acute respiratory distress (HCC) 08/16/2012  . Vocal cord dysfunction 08/18/2012  . Syncope 08/25/2013  . Right sided weakness 08/25/2013  . Chronic respiratory failure ([place]) 08/28/2013  . Anxiety and depression 02/13/2015  . Seizure disorder ([place]) 02/13/2015  . GERD (gastroesophageal reflux disease) 02/13/2015  . Status post gastric bypass for obesity 10/03/2015  . Osteoarthritis 10/03/2015   Resolved Ambulatory Problems    Diagnosis Date Noted  . No Resolved Ambulatory Problems   Past Medical History  Diagnosis Date  . Diabetes mellitus   . DVT of leg (deep venous thrombosis) ([place]) 12/2007  . Morbid obesity ([place])   . Renal cell cancer ([place]) 2009  . Pericarditis   . H/O hiatal hernia   . Anxiety   . Depression   . Allergic rhinitis   . Hyperlipidemia     Family History  Problem Relation Age of Onset  . Hypertension Mother   . Breast cancer Mother   . Heart disease Paternal Grandmother   . Alcoholism Father   . Arthritis Mother   . Hyperlipidemia      Parent  . Mental illness  Parent  . Diabetes      Parent    Social History   Social History  . Marital Status: Married    Spouse Name: N/A  . Number of Children: N/A  . Years of Education: N/A   Occupational History  . Not on file.   Social History Main Topics  . Smoking status: Former Research scientist (life sciences)  . Smokeless tobacco: Never Used  . Alcohol Use: No  . Drug Use: No  . Sexual Activity: Not on file   Other Topics Concern  . 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 Not currently  working    Life stressors (her daughter had a miscarriage, son in trouble in school)          ROS  General:  Negative for nexplained weight loss, fever Skin: Negative for new or changing mole, sore that won't heal HEENT: Negative for trouble hearing, trouble seeing, ringing in ears, mouth sores, hoarseness, change in voice, dysphagia. CV:  Negative for chest pain, dyspnea, edema, palpitations Resp: Negative for cough, dyspnea, hemoptysis GI: Positive for nausea, vomiting, negative for  diarrhea, constipation, abdominal pain, melena, hematochezia. GU: Negative for dysuria, incontinence, urinary hesitance, hematuria, vaginal or penile discharge, polyuria, sexual difficulty, lumps in testicle or breasts MSK: Negative for muscle cramps or aches, positive  joint pain or swelling Neuro: Negative for headaches, weakness, numbness, dizziness, passing out/fainting Psych: Positive for depression, anxiety,  negative memory problems  Objective  Physical Exam Filed Vitals:   10/02/15 0752  BP: 136/84  Pulse: 91  Temp: 98.2 F (36.8 C)    BP Readings from Last 3 Encounters:  10/02/15 136/84  09/27/15 103/81  08/07/15 132/68   Wt Readings from Last 3 Encounters:  10/02/15 294 lb 3.2 oz (133.448 kg)  09/27/15 290 lb (131.543 kg)  08/07/15 312 lb (141.522 kg)    Physical Exam  Constitutional: No distress.  HENT:  Head: Normocephalic and atraumatic.  Right Ear: External ear normal.  Left Ear: External ear normal.  Eyes: Conjunctivae are normal. Pupils are equal, round, and reactive to light.  Cardiovascular: Normal rate, regular rhythm and normal heart sounds.   Pulmonary/Chest: Effort normal and breath sounds normal.  Abdominal: Soft. Bowel sounds are normal. She exhibits no distension. There is no tenderness. There is no rebound and no guarding.  Feeding tube site looks well-healed with no irritation  Musculoskeletal: She exhibits no edema.  Neurological: She is alert. Gait  normal.  Skin: Skin is warm and dry. She is not diaphoretic.  Psychiatric:  Mood depressed and anxious     Assessment/Plan:   Diabetes mellitus ([place]) Recent A1c 6.4. Sugars at home are controlled. Rarely taking metformin. Advised that she could stop the metformin at this time and monitor her sugars.  Hypertension At goal today. Continue current medications.  Seizure disorder ([place]) History of possible seizure disorder in the past. Had been on Keppra. No longer on this. Needs follow-up with neurology to determine next step in management. Referral will be placed. Given return precautions.  Anxiety and depression Patient with significant anxiety and depression. Has been off medicines for about a week. We'll restart at starting doses and work her way back up to her current doses. Please see prescriptions as outlined below for directions. Referral will be placed to psychiatry and psychology. She is given return precautions.  Status post gastric bypass for obesity Patient with significant nausea and lack of appetite following gastric  bypass. She is following with eating disorder clinic and they suggested referral to a therapist. Her surgeon is no longer in her network and thus needs referral to a Garment/textile technologist. Will place referral.  Osteoarthritis Patient reports chronic pain related to her knees. Is on oxycodone and Norco for this. Also on quite high dose of gabapentin that she states she has been on for 5 years. Discussed that I do not prescribe chronic pain medications and we'll provide a refill until we can get her into pain management. Referral will be placed.    Orders Placed This Encounter  Procedures  . Ambulatory referral to Neurology    Referral Priority:  Routine    Referral Type:  Consultation    Referral Reason:  Specialty Services Required    Requested Specialty:  Neurology    Number of Visits Requested:  1  . Ambulatory referral to Psychiatry    Referral Priority:   Routine    Referral Type:  Psychiatric    Referral Reason:  Specialty Services Required    Requested Specialty:  Psychiatry    Number of Visits Requested:  1  . Ambulatory referral to Psychology    Referral Priority:  Routine    Referral Type:  Psychiatric    Referral Reason:  Specialty Services Required    Requested Specialty:  Psychology    Number of Visits Requested:  1  . Ambulatory referral to General Surgery    Referral Priority:  Routine    Referral Type:  Surgical    Referral Reason:  Specialty Services Required    Requested Specialty:  General Surgery    Number of Visits Requested:  1  . Ambulatory referral to Pain Clinic    Referral Priority:  Routine    Referral Type:  Consultation    Referral Reason:  Specialty Services Required    Requested Specialty:  Pain Medicine    Number of Visits Requested:  1    Meds ordered this encounter  Medications  . albuterol (PROVENTIL HFA) 108 (90 Base) MCG/ACT inhaler    Sig: Inhale 2 puffs into the lungs every 4 (four) hours as needed for wheezing or shortness of breath.    Dispense:  1 Inhaler    Refill:  0  . amLODipine (NORVASC) 10 MG tablet    Sig: Take 1 tablet (10 mg total) by mouth daily.    Dispense:  90 tablet    Refill:  3  . aspirin EC 81 MG tablet    Sig: Take 1 tablet (81 mg total) by mouth daily.    Dispense:  90 tablet    Refill:  3  . calcium-vitamin D 250-100 MG-UNIT tablet    Sig: Take 1 tablet by mouth 2 (two) times daily. Reported on 06/16/2015    Dispense:  180 tablet    Refill:  3  . carvedilol (COREG) 25 MG tablet    Sig: Take 1 tablet (25 mg total) by mouth 2 (two) times daily with a meal.    Dispense:  180 tablet    Refill:  3  . Cholecalciferol (VITAMIN D-3) 1000 units CAPS    Sig: Take 1 capsule (1,000 Units total) by mouth daily. Reported on 06/16/2015    Dispense:  90 capsule    Refill:  3  . gabapentin (NEURONTIN) 300 MG capsule    Sig: Take 8 capsules (2,400 mg total) by mouth at bedtime.      Dispense:  240 capsule    Refill:  3  . hydrochlorothiazide (HYDRODIURIL) 25 MG tablet    Sig: Take 1 tablet (25 mg total) by mouth daily.    Dispense:  90 tablet    Refill:  3  . HYDROcodone-acetaminophen (NORCO/VICODIN) 5-325 MG tablet    Sig: Take 1 tablet by mouth every 4 (four) hours as needed for moderate pain.    Dispense:  60 tablet    Refill:  0  . loratadine (CLARITIN) 10 MG tablet    Sig: Take 1 tablet (10 mg total) by mouth at bedtime.    Dispense:  90 tablet    Refill:  3  . mirtazapine (REMERON) 15 MG tablet    Sig: Take 1 tablet (15 mg total) by mouth at bedtime.    Dispense:  90 tablet    Refill:  0  . nitroGLYCERIN (NITROSTAT) 0.4 MG SL tablet    Sig: Place 1 tablet (0.4 mg total) under the tongue every 5 (five) minutes as needed for chest pain. (may repeat every 5 minutes but seek medical help if pain persists after 3 tablets)    Dispense:  15 tablet    Refill:  0  . omeprazole (PRILOSEC) 40 MG capsule    Sig: Take 1 capsule (40 mg total) by mouth daily.    Dispense:  90 capsule    Refill:  3  . ondansetron (ZOFRAN-ODT) 4 MG disintegrating tablet    Sig: Take 1 tablet (4 mg total) by mouth every 8 (eight) hours as needed for nausea or vomiting.    Dispense:  20 tablet    Refill:  0  . oxyCODONE (OXY IR/ROXICODONE) 5 MG immediate release tablet    Sig: Take 1 tablet (5 mg total) by mouth every 4 (four) hours as needed for severe pain. Reported on 08/07/2015    Dispense:  60 tablet    Refill:  0  . polyethylene glycol (MIRALAX / GLYCOLAX) packet    Sig: Take 17 g by mouth 2 (two) times daily. Reported on 08/07/2015    Dispense:  14 each    Refill:  3  . promethazine (PHENERGAN) 25 MG tablet    Sig: Take 1 tablet (25 mg total) by mouth every 8 (eight) hours as needed for nausea or vomiting. Reported on 08/07/2015    Dispense:  30 tablet    Refill:  2  . QUEtiapine (SEROQUEL) 100 MG tablet    Sig: Take 50 mg (1/2 tablet) by mouth daily for 2 days then increase  to 100 mg (1 tablet) by mouth daily.    Dispense:  90 tablet    Refill:  0  . sertraline (ZOLOFT) 100 MG tablet    Sig: Take 50 mg (1/2 tablet) by mouth daily for 1 week, then take 100 mg (1 tablet) by mouth daily for 1 week, then take 150 mg (1.5 tablets) by mouth daily for 1 week, then take 200 mg (2 tablets) by mouth daily    Dispense:  180 tablet    Refill:  0  . Thiamine HCl (VITAMIN B-1) 250 MG tablet    Sig: Take 1 tablet (250 mg total) by mouth daily. Reported on 06/16/2015    Dispense:  90 tablet    Refill:  1  . ursodiol (ACTIGALL) 300 MG capsule    Sig: Take 1 capsule (300 mg total) by mouth 2 (two) times daily.    Dispense:  60 capsule    Refill:  1  . vitamin B-12 (CYANOCOBALAMIN) 500 MCG tablet  Sig: Take 1 tablet (500 mcg total) by mouth daily. Reported on 06/16/2015    Dispense:  90 tablet    Refill:  0  . vitamin E 400 UNIT capsule    Sig: Take 1 capsule (400 Units total) by mouth daily. Reported on 06/16/2015    Dispense:  90 capsule    Refill:  0   Patient needed all medications refilled. Doses confirmed with patient.  Denise Rumps, MD [place]

## 2015-10-03 ENCOUNTER — Encounter: Payer: Self-pay | Admitting: Family Medicine

## 2015-10-03 DIAGNOSIS — M1990 Unspecified osteoarthritis, unspecified site: Secondary | ICD-10-CM | POA: Insufficient documentation

## 2015-10-03 DIAGNOSIS — Z9884 Bariatric surgery status: Secondary | ICD-10-CM | POA: Insufficient documentation

## 2015-10-03 NOTE — Assessment & Plan Note (Signed)
History of possible seizure disorder in the past. Had been on Keppra. No longer on this. Needs follow-up with neurology to determine next step in management. Referral will be placed. Given return precautions.

## 2015-10-03 NOTE — Assessment & Plan Note (Signed)
Patient with significant nausea and lack of appetite following gastric bypass. She is following with eating disorder clinic and they suggested referral to a therapist. Her surgeon is no longer in her network and thus needs referral to a Garment/textile technologist. Will place referral.

## 2015-10-03 NOTE — Assessment & Plan Note (Signed)
Recent A1c 6.4. Sugars at home are controlled. Rarely taking metformin. Advised that she could stop the metformin at this time and monitor her sugars.

## 2015-10-03 NOTE — Assessment & Plan Note (Addendum)
Patient reports chronic pain related to her knees. Is on oxycodone and Norco for this. Also on quite high dose of gabapentin that she states she has been on for 5 years. Discussed that I do not prescribe chronic pain medications and we'll provide a refill until we can get her into pain management. Referral will be placed.

## 2015-10-03 NOTE — Assessment & Plan Note (Signed)
At goal today. Continue current medications. 

## 2015-10-03 NOTE — Assessment & Plan Note (Signed)
Patient with significant anxiety and depression. Has been off medicines for about a week. We'll restart at starting doses and work her way back up to her current doses. Please see prescriptions as outlined below for directions. Referral will be placed to psychiatry and psychology. She is given return precautions.

## 2015-10-20 DIAGNOSIS — M1711 Unilateral primary osteoarthritis, right knee: Secondary | ICD-10-CM | POA: Diagnosis not present

## 2015-10-20 DIAGNOSIS — M1712 Unilateral primary osteoarthritis, left knee: Secondary | ICD-10-CM | POA: Diagnosis not present

## 2015-10-23 ENCOUNTER — Other Ambulatory Visit: Payer: Self-pay | Admitting: *Deleted

## 2015-10-23 NOTE — Patient Outreach (Signed)
[facility]) Care Management [place] Telephone Outreach 10/23/2015  Denise Davis Feb 15, 1966 [id number]  Successful telephone outreach to Denise Davis, 50 y.o. female 50 y/o female followed by [facility] initially for transition of care after IP hospital discharge from [facility] on June 13, 2015. On August 31, 2015, [facility] RN CM was made aware by [place] CM that patient had been re-admitted to [facility] from May 17-23, 2017 for dehydration and malnutrition. Patient was followed by [facility] for transition of care after most recent hospital discharge, and did not have a re-admission; she is now followed by [facility] for ongoing nutritional deficits/ support.   Patient called this morning reporting that she needed to cancel our previously scheduled appointment for later this week, due to conflict around her family's schedule.  [name] reports that she is "doing about the same," and continues to report "poor" ability to eat or take in nutrition through her G-tube.  [name] reports that she "accidently missed my GI doctor appointment," but states that she has re-scheduled the appointment for "later in August."  [name] reports that she is "otherwise doing fine," and denies further questions, problems, concerns, or issues.    [name] was unable to re-schedule our appointment for an -in-home visit today, and asked that I return her call later this week.    Plan:  Will call [name] later this week to re-scheduled appointment for [place] CM in-home visit, as patient requested.  [name], RN, BSN, [employer] [facility] Care Management  [phone]

## 2015-10-24 ENCOUNTER — Ambulatory Visit: Payer: Self-pay | Admitting: *Deleted

## 2015-10-27 ENCOUNTER — Other Ambulatory Visit: Payer: Self-pay | Admitting: *Deleted

## 2015-10-27 NOTE — Patient Outreach (Signed)
[facility]) [place] Telephone outreach 10/27/2015  Denise Davis [birth year] [id number]  Successful telephone outreach to Denise Davis, 50 y.o.female50 y/o female followed by [facility] initially for transition of care after IP hospital discharge from [facility] on June 13, 2015.On August 31, 2015, [facility] RN CM was made aware by [place] CM that patient had been re-admitted to [facility] from May 17-23, 2017 for dehydration and malnutrition.Patient was followed by [facility] for transition of care after most recent hospital discharge, and did not have IP re-admission; she is now followed by [facility] for ongoing nutritional deficits/ support.  Call today was placed to schedule next [place] in-home visit, as patient cancelled our previously scheduled appointment for this week, due to conflict around her family's schedule. We were able to successfully re-schedule [place] CM in-home visit for early August 2017.  Today, Mrs. Denise Davis reports that she is "doing about the same," and continues to report "poor" ability to eat or take in nutrition through her G-tube.  Mrs. Denise Davis reports that she has a follow up appointment with her PCP next week that she plans to attend, and we discussed the need/ value for Ms. Denise Davis to discuss her ongoing issues around her nutritional intake with her PCP.  Mrs. Denise Davis also stated that she has a scheduled appointment with her "new GI doctor," in August 2017 but she can not recall the exact date, nor the doctor's name.  Patient reports that she believes that she is taking in enough hydration/ fluid, and states that she is otherwise "doing fine." We discussed today during our phone conversation signs/ symptoms that would indicate dehydration and I instructed Mrs. Denise Davis to seek urgent care should she experience any of these signs/ symptoms, which she  agreed to do.  Mrs. Denise Davis denies further questions, problems, concerns, or issues today, and she agreed to talk to her PCP about her nutritional deficits and to develop some goals for herself based on her PCP visit/ conversation, as we acknowledged together that thus far, she has been unable to meet her previously established goals.    Plan:  Denise Davis will continue taking her medications as prescribed and will keep all scheduled provider appointments.  Denise Davis will continue attempting to increase her daily protein supplementation by one 8 oz can per day, going from current 2 cans/ day up to 3; she will administer this additional one can slowly and gradually, over the course of each day.  Denise Davis will continue to take fluids appropriately, and will seek urgent care should she think she is becoming dehydrated.  Denise Davis will discuss her ongoing nutritional challenges with her PCP next week during their scheduled OV.  Denise Davis will continue to go outside for conservative activity on the days that she feels good.  Denise Davis will continue to contact her providers for any new concerns, questions, issues, or problems that arise.  [name] will continue following for ongoing nutritional assessment/ evaluation with home visit scheduled for next month.  Denise Rack, RN, BSN, [employer] [facility] Care Management  [phone]

## 2015-10-30 ENCOUNTER — Ambulatory Visit: Payer: Self-pay | Admitting: *Deleted

## 2015-11-02 ENCOUNTER — Ambulatory Visit (INDEPENDENT_AMBULATORY_CARE_PROVIDER_SITE_OTHER): Payer: PPO | Admitting: Family Medicine

## 2015-11-02 ENCOUNTER — Encounter: Payer: Self-pay | Admitting: Family Medicine

## 2015-11-02 ENCOUNTER — Telehealth: Payer: Self-pay | Admitting: Family Medicine

## 2015-11-02 VITALS — BP 128/86 | HR 88 | Temp 98.3°F | Wt 277.8 lb

## 2015-11-02 DIAGNOSIS — Z9884 Bariatric surgery status: Secondary | ICD-10-CM

## 2015-11-02 DIAGNOSIS — F329 Major depressive disorder, single episode, unspecified: Secondary | ICD-10-CM

## 2015-11-02 DIAGNOSIS — R638 Other symptoms and signs concerning food and fluid intake: Secondary | ICD-10-CM | POA: Diagnosis not present

## 2015-11-02 DIAGNOSIS — G40909 Epilepsy, unspecified, not intractable, without status epilepticus: Secondary | ICD-10-CM | POA: Diagnosis not present

## 2015-11-02 DIAGNOSIS — J029 Acute pharyngitis, unspecified: Secondary | ICD-10-CM | POA: Insufficient documentation

## 2015-11-02 DIAGNOSIS — M170 Bilateral primary osteoarthritis of knee: Secondary | ICD-10-CM | POA: Diagnosis not present

## 2015-11-02 DIAGNOSIS — F419 Anxiety disorder, unspecified: Secondary | ICD-10-CM

## 2015-11-02 DIAGNOSIS — E876 Hypokalemia: Secondary | ICD-10-CM

## 2015-11-02 DIAGNOSIS — F418 Other specified anxiety disorders: Secondary | ICD-10-CM | POA: Diagnosis not present

## 2015-11-02 LAB — COMPREHENSIVE METABOLIC PANEL
ALK PHOS: 84 U/L (ref 39–117)
ALT: 14 U/L (ref 0–35)
AST: 18 U/L (ref 0–37)
Albumin: 4.1 g/dL (ref 3.5–5.2)
BUN: 13 mg/dL (ref 6–23)
CALCIUM: 9.7 mg/dL (ref 8.4–10.5)
CHLORIDE: 102 meq/L (ref 96–112)
CO2: 25 mEq/L (ref 19–32)
Creatinine, Ser: 0.86 mg/dL (ref 0.40–1.20)
GFR: 89.77 mL/min (ref >60.00)
GLUCOSE: 118 mg/dL — AB (ref 70–99)
POTASSIUM: 3.2 meq/L — AB (ref 3.5–5.1)
SODIUM: 139 meq/L (ref 135–145)
Total Bilirubin: 0.4 mg/dL (ref 0.2–1.2)
Total Protein: 7.5 g/dL (ref 6.0–8.3)

## 2015-11-02 MED ORDER — POTASSIUM CHLORIDE CRYS ER 20 MEQ PO TBCR: 40.0000 meq | EXTENDED_RELEASE_TABLET | Freq: Every day | ORAL | 0 refills | Status: DC

## 2015-11-02 NOTE — Progress Notes (Signed)
Pre visit review using our clinic review tool, if applicable. No additional management support is needed unless otherwise documented below in the visit note. 

## 2015-11-02 NOTE — Telephone Encounter (Signed)
Informed patient that her potassium is low. Otherwise lab work acceptable. We'll send in potassium supplementation for her to take. We'll plan on rechecking next week.

## 2015-11-02 NOTE — Assessment & Plan Note (Signed)
Suspect viral pharyngitis. No exudate noted. no tonsillar swelling. noted no cervical lymphadenopathy. Centor score of -1. Discussed tea with honey and salt water gargles. She'll continue to monitor.

## 2015-11-02 NOTE — Assessment & Plan Note (Signed)
No recurrent seizures. Patient has an appointment on the 16th of this month with neurology. Given return precautions.

## 2015-11-02 NOTE — Progress Notes (Signed)
Tommi Rumps, MD Phone: [phone]  Denise Davis is a 50 y.o. female who presents today for follow-up.  Diabetes: CBGs have been in the low 100s. She is not taking metformin. No polyuria.  Seizures: No recurrent seizures. Has not heard anything regarding her neurology appointment.  Anxiety/depression: She notes this is somewhat better after getting restarted on her medications. No SI. Taking Seroquel, Zoloft, Remeron. Has an appointment with psychiatry later this month.  Sore throat: She notes it's been a little sore for the last week. States it is itchy and dry. No fever, postnasal drip, congestion, cough, or ear fullness.  Chronic pain in bilateral knees: Takes Vicodin for this. Notes pain is worse with walking. She has osteoarthritis and has had several types of injections in her knees. Needs a referral to new orthopedist.  Gastric bypass surgery: Notes she still has nausea related to this. Not eating much because anytime she eats anything it makes her significantly nauseated. She is doing her tube feeds. Has an appointment with the surgeon in 3 weeks. No abdominal pain with this. Is taking Phenergan and Zofran for this. He has had some decreased oral intake related to this.  PMH: Former smoker   ROS see history of present illness  Objective  Physical Exam Vitals:   11/02/15 1352  BP: 128/86  Pulse: 88  Temp: 98.3 F (36.8 C)    BP Readings from Last 3 Encounters:  11/02/15 128/86  10/02/15 136/84  09/27/15 103/81   Wt Readings from Last 3 Encounters:  11/02/15 277 lb 12.8 oz (126 kg)  10/02/15 294 lb 3.2 oz (133.4 kg)  09/27/15 290 lb (131.5 kg)    Physical Exam  Constitutional: No distress.  HENT:  Head: Normocephalic and atraumatic.  Mouth/Throat: Oropharynx is clear and moist. No oropharyngeal exudate.  Normal TMs bilaterally  Eyes: Conjunctivae are normal. Pupils are equal, round, and reactive to light.  Neck: Neck supple.  Cardiovascular: Normal  rate, regular rhythm and normal heart sounds.   Pulmonary/Chest: Effort normal and breath sounds normal.  Abdominal: Soft. Bowel sounds are normal. She exhibits no distension. There is no tenderness. There is no rebound and no guarding.  Musculoskeletal: She exhibits no edema.  Lymphadenopathy:    She has no cervical adenopathy.  Neurological: She is alert. Gait normal.  Skin: Skin is warm and dry. She is not diaphoretic.  Psychiatric:  Mood depressed, affect mildly flat     Assessment/Plan: Please see individual problem list.  Seizure disorder ([place]) No recurrent seizures. Patient has an appointment on the 16th of this month with neurology. Given return precautions.  Osteoarthritis Chronic pain in her knees related to this. Taking Vicodin. We will refer her to orthopedic surgery for further evaluation and consideration of knee replacement.  Anxiety and depression Somewhat improved with restarting her medications. No SI. She'll continue her current medications and follow up with psychiatry and psychology as scheduled later this month.  Status post gastric bypass for obesity Still with significant nausea related to this. Given decreased oral intake we will check kidney function and electrolytes. She'll continue Phenergan and Zofran. She'll keep her appointment with the bariatric surgeon.  Sore throat Suspect viral pharyngitis. No exudate noted. no tonsillar swelling. noted no cervical lymphadenopathy. Centor score of -1. Discussed tea with honey and salt water gargles. She'll continue to monitor.   Orders Placed This Encounter  Procedures  . Comp Met (CMET)  . Ambulatory referral to Orthopedic Surgery    Referral Priority:  Routine    Referral Type:   Surgical    Referral Reason:   Specialty Services Required    Requested Specialty:   Orthopedic Surgery    Number of Visits Requested:   [place], MD Rafael Hernandez

## 2015-11-02 NOTE — Assessment & Plan Note (Signed)
Somewhat improved with restarting her medications. No SI. She'll continue her current medications and follow up with psychiatry and psychology as scheduled later this month.

## 2015-11-02 NOTE — Assessment & Plan Note (Signed)
Still with significant nausea related to this. Given decreased oral intake we will check kidney function and electrolytes. She'll continue Phenergan and Zofran. She'll keep her appointment with the bariatric surgeon.

## 2015-11-02 NOTE — Assessment & Plan Note (Signed)
Chronic pain in her knees related to this. Taking Vicodin. We will refer her to orthopedic surgery for further evaluation and consideration of knee replacement.

## 2015-11-02 NOTE — Patient Instructions (Signed)
Nice to see you. We will check your kidney function today given her nausea and decreased oral intake. You have an appointment with neurology on the 16th. Please monitor sore throat. you can use tea with warm honey and salt water gargles for this. We will also refer you to an orthopedic surgeon. If you develop decreased urination, worsening nausea, vomiting, thoughts of harming herself or others, seizures, or any new or changing symptoms please seek medical attention.

## 2015-11-03 NOTE — Telephone Encounter (Signed)
LM for patient to return call.

## 2015-11-04 ENCOUNTER — Other Ambulatory Visit: Payer: Self-pay | Admitting: Family Medicine

## 2015-11-06 ENCOUNTER — Other Ambulatory Visit: Payer: Self-pay | Admitting: *Deleted

## 2015-11-06 NOTE — Patient Outreach (Signed)
[facility]) Care Management [place] Telephone Outreach 11/06/2015  Denise Davis 10-30-1965 [id number]  Successful telephone outreach to Denise Davis y.o.female50 y/o female followed by [place] initially for transition of care after IP hospital discharge from [facility] on June 13, 2015.On August 31, 2015, [facility] Community RN CM was made aware by [place] CM that patient had been re-admitted to [facility] from May 17-23, 2017 for dehydration and malnutrition.Patient wasfollowed by [place] for transition of care after most recent hospital discharge, and did not have IP re-admission; she is now followed by [place] for ongoing nutritional deficits/ support.  Call today was placed to confirm next [place] in-home visit for tomorrow, which was confirmed.  Patient denies further needs, concerns, or problems today.  We briefly discussed today that patient and I would evaluate patient goals and progress during home visit tomorrow, as patient verbalized that she feels she may be ready for discharge, as the goals she has set thus far she has been unable to successfully meet.  Plan:  [place] CM in-home visit scheduled for tomorrow, possibly for patient discharge from [facility] CM.  [name], RN, BSN, [employer] [facility] Care Management  [phone]

## 2015-11-06 NOTE — Telephone Encounter (Signed)
Sent to pharmacy 

## 2015-11-06 NOTE — Telephone Encounter (Signed)
Scheduled patient for Wednesday to have labs drawn. Please place orders

## 2015-11-06 NOTE — Telephone Encounter (Signed)
Can we refill this? 

## 2015-11-07 ENCOUNTER — Ambulatory Visit: Payer: Self-pay | Admitting: *Deleted

## 2015-11-07 ENCOUNTER — Other Ambulatory Visit: Payer: Self-pay | Admitting: *Deleted

## 2015-11-07 NOTE — Patient Outreach (Signed)
[facility]) Care Management [place] Telephone Outreach 11/07/2015  Denise Davis May 23, 1965 [id number]  Successful telephone outreach to "Denise Davis, 50 y.o. female followed by [facility] initially for transition of care after IP hospital discharge from [facility] on June 13, 2015.   On August 31, 2015, [facility] Community RN CM was made aware by [place] CM that patient had been re-admitted to [facility] from May 17-23, 2017 for dehydration and malnutrition.  Patient completed most recent transition of care without rehospitalization and is now followed by [place] [place] for chronic disease management of DM and  ongoing nutritional deficits.  HIPAA verified.  I returned a voice mail left for me this morning from [place], in which she stated she needed to cancel our previously scheduled [place] in-home visit for today, stating that she wasn't feeling well enough to attend the visit.  Today, Denise Davis reports that the rainy weather has made her knee pain flare up and she doesn't want to get out of bed.  Patient sates that she has taken pain medication for the pain and expects that she should be feeling better soon, but doesn't know how soon.  Denise Davis reports that the sore throat she reported to Dr. Caryl Bis last week "has gone away completely," and denies further issues today, other than her knee pain.  Denise Davis continues to report ongoing issues around the nausea she has experienced ongoing since her bariatric surgery, and again states that "not much has changed" around her nutritional status.  Denise Davis reports that she has a follow appointment for additional lab work tomorrow, which she plans to attend as well as an appointment "soon" with her bariatric surgeon to follow up on this request.  Denise Davis reports that she believes her DM is "holding steady," and reports that she has continued monitoring her blood sugars regularly, although she has not yet  checked her blood sugar this morning because she feels too bad to do so.  Denise Davis is unsure of when her scheduled appointment is with her bariatric surgeon, as her calendar is not currently where she can get to it.  Denise Davis was unable to re-schedule out planned [place] in-home visit, as her calendar is not near her at the time of our call.  She reports that she will be travelling for "much of the rest of the month," reporting that she is going to visit her mother in [place].  Denise Davis stated that she will return to [facility] "the week of November 21, 2015," and she asked that I call her that week to schedule our next [place] in home visit which was cancelled by patient today.  We briefly discussed [place]'s established [facility] CM goals, and she reports that she has been unable to meet her goals.  I encouraged her to be thinking about her goals for our discussion and review when she returns to [facility] after her planned trip to [place], and she agreed to do so.     Plan:    Denise Davis will continue taking her medications as prescribed and will keep all scheduled provider appointments.   Denise Davis will continue trying to increase her daily protein supplementation by one 8 oz can per day, going from current 2 cans/ day up to 3; she will administer this additional one can slowly and gradually, over the course of each day.   Denise Davis will continue to try and go outside for conservative activity on the days that she feels  good.   Denise Davis will continue to contact her providers for any new concerns, questions, issues, or problems that arise.   Denise Davis will continue following for ongoing nutritional assessment/ self-health management of chronic disease state of DM with scheduled telephone outreach when patient returns to [facility] after her trip.   Denise Rack, RN, BSN, [employer] [facility] Care Management  [phone]

## 2015-11-07 NOTE — Telephone Encounter (Signed)
Orders already placed.

## 2015-11-08 ENCOUNTER — Other Ambulatory Visit (INDEPENDENT_AMBULATORY_CARE_PROVIDER_SITE_OTHER): Payer: PPO

## 2015-11-08 DIAGNOSIS — K9509 Other complications of gastric band procedure: Secondary | ICD-10-CM | POA: Diagnosis not present

## 2015-11-08 DIAGNOSIS — E876 Hypokalemia: Secondary | ICD-10-CM | POA: Diagnosis not present

## 2015-11-08 DIAGNOSIS — R1310 Dysphagia, unspecified: Secondary | ICD-10-CM | POA: Diagnosis not present

## 2015-11-09 ENCOUNTER — Other Ambulatory Visit: Payer: Self-pay | Admitting: Family Medicine

## 2015-11-09 LAB — POTASSIUM: POTASSIUM: 3.3 meq/L — AB (ref 3.5–5.1)

## 2015-11-09 MED ORDER — POTASSIUM CHLORIDE CRYS ER 20 MEQ PO TBCR: 40.0000 meq | EXTENDED_RELEASE_TABLET | Freq: Every day | ORAL | 0 refills | Status: DC

## 2015-11-15 ENCOUNTER — Ambulatory Visit: Payer: Self-pay | Admitting: Neurology

## 2015-11-21 ENCOUNTER — Telehealth: Payer: Self-pay

## 2015-11-21 DIAGNOSIS — E876 Hypokalemia: Secondary | ICD-10-CM

## 2015-11-21 NOTE — Telephone Encounter (Signed)
Placed lab order for repeat potassium on 11/22/15.

## 2015-11-22 ENCOUNTER — Other Ambulatory Visit: Payer: Self-pay | Admitting: Family Medicine

## 2015-11-22 ENCOUNTER — Other Ambulatory Visit (INDEPENDENT_AMBULATORY_CARE_PROVIDER_SITE_OTHER): Payer: PPO

## 2015-11-22 ENCOUNTER — Telehealth: Payer: Self-pay

## 2015-11-22 DIAGNOSIS — E876 Hypokalemia: Secondary | ICD-10-CM | POA: Diagnosis not present

## 2015-11-22 DIAGNOSIS — Z23 Encounter for immunization: Secondary | ICD-10-CM | POA: Diagnosis not present

## 2015-11-22 LAB — POTASSIUM: Potassium: 2.8 mEq/L — CL (ref 3.5–5.1)

## 2015-11-22 MED ORDER — POTASSIUM CHLORIDE CRYS ER 20 MEQ PO TBCR: 40.0000 meq | EXTENDED_RELEASE_TABLET | Freq: Two times a day (BID) | ORAL | 0 refills | Status: DC

## 2015-11-22 NOTE — Telephone Encounter (Signed)
Called patient and advised of results. Pt aware we have sent in potassium rx. Pt will recheck potassium on Friday.

## 2015-11-22 NOTE — Telephone Encounter (Signed)
New Rx for potassium sent. Needs recheck on Friday.

## 2015-11-22 NOTE — Telephone Encounter (Signed)
[facility] called with critical potassium results on patient. Potassium result of 2.8. Please advise of next step in Dr. Ellen Henri absence.

## 2015-11-23 ENCOUNTER — Ambulatory Visit: Payer: Self-pay | Admitting: Psychology

## 2015-11-23 ENCOUNTER — Ambulatory Visit: Payer: Self-pay | Admitting: *Deleted

## 2015-11-23 ENCOUNTER — Other Ambulatory Visit: Payer: Self-pay | Admitting: *Deleted

## 2015-11-23 DIAGNOSIS — K311 Adult hypertrophic pyloric stenosis: Secondary | ICD-10-CM | POA: Diagnosis not present

## 2015-11-23 NOTE — Patient Outreach (Signed)
[facility]) Care Management [place] Telephone Outreach 11/23/2015  Denise Davis 04-May-1965 [id number]   Unsuccessful telephone outreach to "[name], 50 y.o.femalefollowed by [place] initially for transition of care after IP hospital discharge from [facility] on June 13, 2015.On August 31, 2015, [facility] Community RN CM was made aware by [place] CM that patient had been re-admitted to [facility] from May 17-23, 2017 for dehydration and malnutrition.Patient completed most recent transition of care without rehospitalization and is now followed by [place] for chronic disease management of DM and  ongoing nutritional deficits.  Call today was placed in attempt to re-schedule last [place] in-home visit, which patient cancelled.  HIPAA compliant VM message left for patient with my direct contact information, asking her to return my call.  Plan:   Will re-attempt [facility] Community CM telephone outreach next week if I do not hear back from [place] before then.   [name], RN, BSN, [employer] [facility] Care Management  [phone]

## 2015-11-24 ENCOUNTER — Other Ambulatory Visit: Payer: Self-pay

## 2015-11-27 ENCOUNTER — Telehealth: Payer: Self-pay | Admitting: Family Medicine

## 2015-11-27 ENCOUNTER — Ambulatory Visit: Payer: Self-pay | Admitting: *Deleted

## 2015-11-27 ENCOUNTER — Other Ambulatory Visit: Payer: Self-pay | Admitting: *Deleted

## 2015-11-27 NOTE — Telephone Encounter (Signed)
Raquel Sarna from [facility] called in regards to a Rx form they sent over. She said she was told they should receive it back from [place] sometime today and they haven't yet. The form is in regards to Ms. Denise Davis receiving c-pap equipment. Please give Raquel Sarna a call regarding this.  Emily's ph# [phone] Thank you.

## 2015-11-27 NOTE — Patient Outreach (Signed)
[place] [facility]) Care Management  11/27/2015  URA KEPHART [birth year] [id number]   Unsuccessful telephone outreach to "Ephriam Knuckles y.o.femalefollowed by [place] initially for transition of care after IP hospital discharge from [facility] on June 13, 2015.On August 31, 2015, [facility] Community RN CM was made aware by [place] CM that patient had been re-admitted to [facility] May 17-23, 2017 for dehydration and malnutrition.Patient completed most recent transition of care without rehospitalization and is now followed by [place] for chronic disease management of DM and ongoing nutritional deficits.  Call today was placed in attempt to re-schedule last [place] in-home visit, which patient cancelled.  HIPAA compliant VM message left for patient with my direct contact information, asking her to return my call.  Plan:   Will re-attempt [place] telephone outreach later this week if I do not hear back from [place] before then.  [name], RN, BSN, [employer] [facility] Care Management  [phone]

## 2015-11-27 NOTE — Telephone Encounter (Signed)
Patient coming in 12/01/15 for appointment to discuss.

## 2015-11-27 NOTE — Telephone Encounter (Signed)
Please advise if this was taken care of , thanks

## 2015-11-28 ENCOUNTER — Encounter: Payer: Self-pay | Admitting: *Deleted

## 2015-11-28 ENCOUNTER — Other Ambulatory Visit: Payer: Self-pay | Admitting: *Deleted

## 2015-11-28 NOTE — Patient Outreach (Signed)
[facility]) Care Management [place] Telephone Outreach 11/28/2015  Denise Davis Nov 03, 1965 [id number]  Successful telephone outreach to "Denise Davis, 50 y.o.femalefollowed by [place] initially for transition of care after IP hospital discharge from [facility] on June 13, 2015.On August 31, 2015, [facility] Community RN CM was made aware by [place] CM that patient had been re-admitted to [facility], 2017 for dehydration and malnutrition.Patient completed most recent transition of care without rehospitalization and is now followed by [place] for self-health management of chronic disease management of DM and  ongoing nutritional deficits.HIPAA verified.  Today, Denise Davis reports that she has returned form her recent trip to visit her family, and stated that she enjoyed her trip, and did not experience any problems around her health management while she was away.  Denise Davis continues to report ongoing issues around nausea she has experienced ongoing since her bariatric surgery, and again states that "not much has changed" around her nutritional status, despite the numerous strategies she has tried.  Denise Davis stated that she has attended all of her scheduled provider appointments, and reports that she has continued to make her providers aware of this ongoing issue.  Denise Davis reports that she has been taken off of her "diabetes medications" due to her poor nutritional intake, but states that she has continued to monitor and record her blood sugars.  Denise Davis reports that she "still has diabetes," just that she is not on medication for it.  Denise Davis reports that she has a follow appointment for additional lab work tomorrow, to check her potassium.  We again discussed some of the various strategies we developed in an effort to improve her nutritional status, and again Denise Davis reports that "nothing has worked," and that she "just can't"  take in the proper amount of nutritional supplementation through her G-tube as her providers recommend.  We again discussed [name]'s established [facility] CM goals, and she continues to report that she feels unable to meet her goals around her nutritional status.  We discussed that she has met her goals for not being re-admitted to the hospital as well as around her ability to get out more, and I provided positive reinforcement that she has been able to meet those goals.  We again discussed the importance of Denise Davis staying hydrated while she feels unable to take in adequate nutrition, as well as keeping her medical providers aware of her ongoing nausea, as well as any new concerns, issues, problems that might arise.  Denise Davis continues to feel as if "nothing is happening" around her goals to improve her nutritional status, and she agrees that a referral to [place] is a good option for ongoing support as she continues to navigate this issue around her chronic disease state of diabetes.  Plan:   Denise Davis continue taking her medications as prescribed and will keep all scheduled provider appointments.  Denise Davis continue to contact her providers for any new concerns, questions, issues, or problems that arise.  Denise Davis will refer to [place] for ongoing follow up around patient's nutritional deficits in the setting of chronic disease state of Diabetes, and will make patient's PCP aware of same.  It has been a pleasure participating in [place], [place], BSN, [place] Coordinator [facility] Care Management  [phone]

## 2015-11-28 NOTE — Telephone Encounter (Signed)
Noted, thanks!

## 2015-11-28 NOTE — Telephone Encounter (Signed)
Please advise thanks.

## 2015-11-28 NOTE — Telephone Encounter (Signed)
As in previous message patient has an appointment to discuss this on Friday. Nothing can be done until appointment.

## 2015-11-28 NOTE — Telephone Encounter (Signed)
Information should be faxed to [place]

## 2015-11-29 ENCOUNTER — Encounter: Payer: Self-pay | Admitting: *Deleted

## 2015-11-30 ENCOUNTER — Other Ambulatory Visit: Payer: Self-pay

## 2015-11-30 ENCOUNTER — Telehealth: Payer: Self-pay | Admitting: *Deleted

## 2015-11-30 NOTE — Telephone Encounter (Signed)
[facility] called to add when form is sent back to add using and benefiting from the Cpap device. Thank you!

## 2015-11-30 NOTE — Telephone Encounter (Signed)
FYI

## 2015-11-30 NOTE — Telephone Encounter (Signed)
Noted  

## 2015-12-01 ENCOUNTER — Other Ambulatory Visit (INDEPENDENT_AMBULATORY_CARE_PROVIDER_SITE_OTHER): Payer: PPO

## 2015-12-01 ENCOUNTER — Ambulatory Visit: Payer: Self-pay | Admitting: *Deleted

## 2015-12-01 ENCOUNTER — Encounter: Payer: Self-pay | Admitting: *Deleted

## 2015-12-01 ENCOUNTER — Observation Stay
Admission: EM | Admit: 2015-12-01 | Discharge: 2015-12-02 | Disposition: A | Discharge status: Home / Self Care | Payer: PPO | Attending: Internal Medicine | Admitting: Internal Medicine

## 2015-12-01 ENCOUNTER — Ambulatory Visit (INDEPENDENT_AMBULATORY_CARE_PROVIDER_SITE_OTHER): Payer: PPO | Admitting: Family Medicine

## 2015-12-01 ENCOUNTER — Emergency Department: Payer: PPO

## 2015-12-01 ENCOUNTER — Observation Stay (HOSPITAL_BASED_OUTPATIENT_CLINIC_OR_DEPARTMENT_OTHER)
Admit: 2015-12-01 | Discharge: 2015-12-01 | Disposition: A | Discharge status: Home / Self Care | Payer: PPO | Attending: Internal Medicine | Admitting: Internal Medicine

## 2015-12-01 ENCOUNTER — Encounter: Payer: Self-pay | Admitting: Family Medicine

## 2015-12-01 VITALS — BP 132/84 | HR 98 | Temp 98.4°F | Wt 274.6 lb

## 2015-12-01 DIAGNOSIS — E119 Type 2 diabetes mellitus without complications: Secondary | ICD-10-CM | POA: Insufficient documentation

## 2015-12-01 DIAGNOSIS — J9610 Chronic respiratory failure, unspecified whether with hypoxia or hypercapnia: Secondary | ICD-10-CM | POA: Diagnosis not present

## 2015-12-01 DIAGNOSIS — E785 Hyperlipidemia, unspecified: Secondary | ICD-10-CM | POA: Insufficient documentation

## 2015-12-01 DIAGNOSIS — K449 Diaphragmatic hernia without obstruction or gangrene: Secondary | ICD-10-CM | POA: Diagnosis not present

## 2015-12-01 DIAGNOSIS — R079 Chest pain, unspecified: Secondary | ICD-10-CM | POA: Diagnosis not present

## 2015-12-01 DIAGNOSIS — M1990 Unspecified osteoarthritis, unspecified site: Secondary | ICD-10-CM | POA: Diagnosis not present

## 2015-12-01 DIAGNOSIS — I10 Essential (primary) hypertension: Secondary | ICD-10-CM | POA: Diagnosis not present

## 2015-12-01 DIAGNOSIS — R9431 Abnormal electrocardiogram [ECG] [EKG]: Secondary | ICD-10-CM

## 2015-12-01 DIAGNOSIS — Z811 Family history of alcohol abuse and dependence: Secondary | ICD-10-CM | POA: Insufficient documentation

## 2015-12-01 DIAGNOSIS — R002 Palpitations: Secondary | ICD-10-CM | POA: Diagnosis not present

## 2015-12-01 DIAGNOSIS — G4730 Sleep apnea, unspecified: Secondary | ICD-10-CM | POA: Insufficient documentation

## 2015-12-01 DIAGNOSIS — I319 Disease of pericardium, unspecified: Secondary | ICD-10-CM | POA: Diagnosis not present

## 2015-12-01 DIAGNOSIS — Z8249 Family history of ischemic heart disease and other diseases of the circulatory system: Secondary | ICD-10-CM | POA: Insufficient documentation

## 2015-12-01 DIAGNOSIS — F419 Anxiety disorder, unspecified: Secondary | ICD-10-CM | POA: Diagnosis not present

## 2015-12-01 DIAGNOSIS — Z85528 Personal history of other malignant neoplasm of kidney: Secondary | ICD-10-CM | POA: Diagnosis not present

## 2015-12-01 DIAGNOSIS — J309 Allergic rhinitis, unspecified: Secondary | ICD-10-CM | POA: Insufficient documentation

## 2015-12-01 DIAGNOSIS — K909 Intestinal malabsorption, unspecified: Secondary | ICD-10-CM | POA: Diagnosis not present

## 2015-12-01 DIAGNOSIS — F329 Major depressive disorder, single episode, unspecified: Secondary | ICD-10-CM | POA: Insufficient documentation

## 2015-12-01 DIAGNOSIS — Z905 Acquired absence of kidney: Secondary | ICD-10-CM | POA: Diagnosis not present

## 2015-12-01 DIAGNOSIS — Z931 Gastrostomy status: Secondary | ICD-10-CM | POA: Diagnosis not present

## 2015-12-01 DIAGNOSIS — Z803 Family history of malignant neoplasm of breast: Secondary | ICD-10-CM | POA: Insufficient documentation

## 2015-12-01 DIAGNOSIS — Z818 Family history of other mental and behavioral disorders: Secondary | ICD-10-CM | POA: Insufficient documentation

## 2015-12-01 DIAGNOSIS — Z86718 Personal history of other venous thrombosis and embolism: Secondary | ICD-10-CM | POA: Insufficient documentation

## 2015-12-01 DIAGNOSIS — K219 Gastro-esophageal reflux disease without esophagitis: Principal | ICD-10-CM | POA: Insufficient documentation

## 2015-12-01 DIAGNOSIS — J383 Other diseases of vocal cords: Secondary | ICD-10-CM | POA: Diagnosis not present

## 2015-12-01 DIAGNOSIS — R05 Cough: Secondary | ICD-10-CM | POA: Diagnosis not present

## 2015-12-01 DIAGNOSIS — G40909 Epilepsy, unspecified, not intractable, without status epilepticus: Secondary | ICD-10-CM | POA: Diagnosis not present

## 2015-12-01 DIAGNOSIS — E876 Hypokalemia: Secondary | ICD-10-CM

## 2015-12-01 DIAGNOSIS — Z6841 Body Mass Index (BMI) 40.0 and over, adult: Secondary | ICD-10-CM | POA: Insufficient documentation

## 2015-12-01 DIAGNOSIS — Z87891 Personal history of nicotine dependence: Secondary | ICD-10-CM | POA: Insufficient documentation

## 2015-12-01 DIAGNOSIS — Z833 Family history of diabetes mellitus: Secondary | ICD-10-CM | POA: Insufficient documentation

## 2015-12-01 DIAGNOSIS — Z9884 Bariatric surgery status: Secondary | ICD-10-CM | POA: Insufficient documentation

## 2015-12-01 LAB — CBC
HEMATOCRIT: 43.4 % (ref 35.0–47.0)
HEMOGLOBIN: 14.6 g/dL (ref 12.0–16.0)
MCH: 29.8 pg (ref 26.0–34.0)
MCHC: 33.7 g/dL (ref 32.0–36.0)
MCV: 88.3 fL (ref 80.0–100.0)
Platelets: 261 10*3/uL (ref 150–440)
RBC: 4.92 MIL/uL (ref 3.80–5.20)
RDW: 15.9 % — ABNORMAL HIGH (ref 11.5–14.5)
WBC: 6.3 10*3/uL (ref 3.6–11.0)

## 2015-12-01 LAB — BASIC METABOLIC PANEL
ANION GAP: 10 (ref 5–15)
BUN: 11 mg/dL (ref 6–20)
CO2: 29 mmol/L (ref 22–32)
Calcium: 9.4 mg/dL (ref 8.9–10.3)
Chloride: 101 mmol/L (ref 101–111)
Creatinine, Ser: 0.83 mg/dL (ref 0.44–1.00)
GFR calc Af Amer: >60 mL/min (ref >60)
Glucose, Bld: 111 mg/dL — ABNORMAL HIGH (ref 65–99)
POTASSIUM: 2.8 mmol/L — AB (ref 3.5–5.1)
SODIUM: 140 mmol/L (ref 135–145)

## 2015-12-01 LAB — TROPONIN I: Troponin I: <0.03 ng/mL (ref <0.03)

## 2015-12-01 LAB — GLUCOSE, CAPILLARY: GLUCOSE-CAPILLARY: 116 mg/dL — AB (ref 65–99)

## 2015-12-01 LAB — MAGNESIUM: MAGNESIUM: 2.1 mg/dL (ref 1.7–2.4)

## 2015-12-01 LAB — POTASSIUM: POTASSIUM: 3 meq/L — AB (ref 3.5–5.1)

## 2015-12-01 LAB — FIBRIN DERIVATIVES D-DIMER (ARMC ONLY): FIBRIN DERIVATIVES D-DIMER (ARMC): 434 (ref 0–499)

## 2015-12-01 MED ORDER — SODIUM CHLORIDE 0.9% FLUSH
3.0000 mL | Freq: Two times a day (BID) | INTRAVENOUS | Status: DC
  Administered 2015-12-01 – 2015-12-02 (×2): 3 mL via INTRAVENOUS

## 2015-12-01 MED ORDER — ONDANSETRON HCL 4 MG/2ML IJ SOLN: 4.0000 mg | Freq: Four times a day (QID) | INTRAMUSCULAR | Status: DC | PRN

## 2015-12-01 MED ORDER — AMLODIPINE BESYLATE 10 MG PO TABS
10.0000 mg | ORAL_TABLET | Freq: Every day | ORAL | Status: DC
Start: 2015-12-01 — End: 2015-12-02
  Administered 2015-12-01 – 2015-12-02 (×2): 10 mg via ORAL
  Filled 2015-12-01 (×2): qty 1

## 2015-12-01 MED ORDER — POTASSIUM CHLORIDE 10 MEQ/100ML IV SOLN
10.0000 meq | Freq: Once | INTRAVENOUS | Status: AC
  Administered 2015-12-02: 10 meq via INTRAVENOUS
  Filled 2015-12-01: qty 100

## 2015-12-01 MED ORDER — IPRATROPIUM-ALBUTEROL 0.5-2.5 (3) MG/3ML IN SOLN
3.0000 mL | Freq: Once | RESPIRATORY_TRACT | Status: AC
  Administered 2015-12-01: 3 mL via RESPIRATORY_TRACT
  Filled 2015-12-01: qty 3

## 2015-12-01 MED ORDER — MIRTAZAPINE 15 MG PO TABS
15.0000 mg | ORAL_TABLET | Freq: Every day | ORAL | Status: DC
Start: 2015-12-01 — End: 2015-12-02
  Administered 2015-12-01: 15 mg via ORAL
  Filled 2015-12-01: qty 1

## 2015-12-01 MED ORDER — VITAMIN B-12 1000 MCG PO TABS
500.0000 ug | ORAL_TABLET | Freq: Every day | ORAL | Status: DC
  Administered 2015-12-01 – 2015-12-02 (×2): 500 ug via ORAL
  Filled 2015-12-01 (×2): qty 1

## 2015-12-01 MED ORDER — LORATADINE 10 MG PO TABS
10.0000 mg | ORAL_TABLET | Freq: Every day | ORAL | Status: DC
  Administered 2015-12-01: 10 mg via ORAL
  Filled 2015-12-01: qty 1

## 2015-12-01 MED ORDER — ACETAMINOPHEN 650 MG RE SUPP: 650.0000 mg | Freq: Four times a day (QID) | RECTAL | Status: DC | PRN

## 2015-12-01 MED ORDER — NITROGLYCERIN 0.4 MG SL SUBL: 0.4000 mg | SUBLINGUAL_TABLET | SUBLINGUAL | Status: DC | PRN

## 2015-12-01 MED ORDER — ONDANSETRON HCL 4 MG PO TABS: 4.0000 mg | ORAL_TABLET | Freq: Four times a day (QID) | ORAL | Status: DC | PRN

## 2015-12-01 MED ORDER — ACETAMINOPHEN 325 MG PO TABS: 650.0000 mg | ORAL_TABLET | Freq: Four times a day (QID) | ORAL | Status: DC | PRN

## 2015-12-01 MED ORDER — PROMETHAZINE HCL 25 MG/ML IJ SOLN
12.5000 mg | Freq: Four times a day (QID) | INTRAMUSCULAR | Status: DC | PRN
  Administered 2015-12-01: 12.5 mg via INTRAVENOUS
  Filled 2015-12-01: qty 1

## 2015-12-01 MED ORDER — ASPIRIN EC 81 MG PO TBEC
81.0000 mg | DELAYED_RELEASE_TABLET | Freq: Every day | ORAL | Status: DC
  Administered 2015-12-01 – 2015-12-02 (×2): 81 mg via ORAL
  Filled 2015-12-01 (×2): qty 1

## 2015-12-01 MED ORDER — PANTOPRAZOLE SODIUM 40 MG PO TBEC
40.0000 mg | DELAYED_RELEASE_TABLET | Freq: Every day | ORAL | Status: DC
  Administered 2015-12-01 – 2015-12-02 (×2): 40 mg via ORAL
  Filled 2015-12-01 (×2): qty 1

## 2015-12-01 MED ORDER — HEPARIN SODIUM (PORCINE) 5000 UNIT/ML IJ SOLN
5000.0000 [IU] | Freq: Three times a day (TID) | INTRAMUSCULAR | Status: DC
  Administered 2015-12-01 – 2015-12-02 (×2): 5000 [IU] via SUBCUTANEOUS
  Filled 2015-12-01 (×2): qty 1

## 2015-12-01 MED ORDER — POTASSIUM CHLORIDE 20 MEQ PO PACK: 40.0000 meq | PACK | Freq: Once | ORAL | Status: DC

## 2015-12-01 MED ORDER — POTASSIUM CHLORIDE CRYS ER 20 MEQ PO TBCR
40.0000 meq | EXTENDED_RELEASE_TABLET | Freq: Once | ORAL | Status: DC
  Filled 2015-12-01: qty 2

## 2015-12-01 MED ORDER — VITAMIN D 1000 UNITS PO TABS
1000.0000 [IU] | ORAL_TABLET | Freq: Every day | ORAL | Status: DC
  Administered 2015-12-01 – 2015-12-02 (×2): 1000 [IU] via ORAL
  Filled 2015-12-01 (×2): qty 1

## 2015-12-01 MED ORDER — ADULT MULTIVITAMIN W/MINERALS CH
1.0000 | ORAL_TABLET | Freq: Every day | ORAL | Status: DC
  Administered 2015-12-01 – 2015-12-02 (×2): 1 via ORAL
  Filled 2015-12-01 (×2): qty 1

## 2015-12-01 MED ORDER — POTASSIUM CHLORIDE 10 MEQ/100ML IV SOLN
10.0000 meq | INTRAVENOUS | Status: AC
  Administered 2015-12-01 (×3): 10 meq via INTRAVENOUS
  Filled 2015-12-01 (×3): qty 100

## 2015-12-01 MED ORDER — HYDROCODONE-ACETAMINOPHEN 5-325 MG PO TABS
1.0000 | ORAL_TABLET | ORAL | Status: DC | PRN
  Administered 2015-12-01: 1 via ORAL
  Filled 2015-12-01: qty 1

## 2015-12-01 MED ORDER — VITAMIN E 180 MG (400 UNIT) PO CAPS
400.0000 [IU] | ORAL_CAPSULE | Freq: Every day | ORAL | Status: DC
  Administered 2015-12-01 – 2015-12-02 (×2): 400 [IU] via ORAL
  Filled 2015-12-01 (×2): qty 1

## 2015-12-01 MED ORDER — CALCIUM CITRATE-VITAMIN D 500-400 MG-UNIT PO CHEW
1.0000 | CHEWABLE_TABLET | Freq: Two times a day (BID) | ORAL | Status: DC
  Administered 2015-12-01 – 2015-12-02 (×2): 1 via ORAL
  Filled 2015-12-01 (×2): qty 1

## 2015-12-01 MED ORDER — POTASSIUM CHLORIDE 20 MEQ PO PACK: 20.0000 meq | PACK | Freq: Two times a day (BID) | ORAL | Status: DC

## 2015-12-01 MED ORDER — URSODIOL 300 MG PO CAPS
300.0000 mg | ORAL_CAPSULE | Freq: Two times a day (BID) | ORAL | Status: DC
  Administered 2015-12-01 – 2015-12-02 (×2): 300 mg via ORAL
  Filled 2015-12-01 (×3): qty 1

## 2015-12-01 MED ORDER — POTASSIUM CHLORIDE IN NACL 20-0.9 MEQ/L-% IV SOLN
INTRAVENOUS | Status: DC
  Administered 2015-12-01: 22:00:00 via INTRAVENOUS
  Filled 2015-12-01 (×3): qty 1000

## 2015-12-01 MED ORDER — CARVEDILOL 12.5 MG PO TABS
25.0000 mg | ORAL_TABLET | Freq: Two times a day (BID) | ORAL | Status: DC
  Administered 2015-12-02: 25 mg via ORAL
  Filled 2015-12-01: qty 2

## 2015-12-01 MED ORDER — SERTRALINE HCL 100 MG PO TABS
200.0000 mg | ORAL_TABLET | Freq: Every day | ORAL | Status: DC
  Administered 2015-12-01: 200 mg via ORAL
  Filled 2015-12-01: qty 2

## 2015-12-01 MED ORDER — VITAMIN B-1 100 MG PO TABS
250.0000 mg | ORAL_TABLET | Freq: Every day | ORAL | Status: DC
  Administered 2015-12-01 – 2015-12-02 (×2): 250 mg via ORAL
  Filled 2015-12-01 (×2): qty 1

## 2015-12-01 MED ORDER — POTASSIUM CHLORIDE 20 MEQ PO PACK
20.0000 meq | PACK | Freq: Two times a day (BID) | ORAL | Status: DC
  Administered 2015-12-02: 20 meq
  Filled 2015-12-01: qty 1

## 2015-12-01 NOTE — ED Notes (Signed)
Pt states chest pain located centrally, states it radiates to back on both sides x 2 days. Also c/o nausea. Pt has feeding tube LLQ, states it is hard for her to eat because of nausea. Pt has been treated for hypokalemia with 3 prescriptions of potassium, ended last Thursday.

## 2015-12-01 NOTE — Progress Notes (Signed)
Pre visit review using our clinic review tool, if applicable. No additional management support is needed unless otherwise documented below in the visit note. 

## 2015-12-01 NOTE — Assessment & Plan Note (Signed)
Patient notes intermittent chest discomfort and shortness of breath starting earlier today with some clamminess. Recurred in the office with new T-wave inversions on EKG. Patient was given an aspirin in the office. She reports history of blood pressure dropping with nitroglycerin thus this was deferred at this time. EMS was called to transport the patient to the ED for evaluation.

## 2015-12-01 NOTE — ED Provider Notes (Signed)
[facility] Emergency Department Provider Note    First MD Initiated Contact with Patient 12/01/15 1619     (approximate)  I have reviewed the triage vital signs and the nursing notes.   HISTORY  Chief Complaint Chest Pain and Shortness of Breath    HPI Denise Davis is a 50 y.o. female who is status post gastric bypass surgery presenting with 2 days of chest pain radiating to bilateral shoulders and back. Currently rates the discomfort as 3 out of 10. Denies any associated diaphoresis. Does state that she feels generally weak. Patient also with history of hypokalemia is refractory to several prescriptions of potassium supplementation. Denies any nausea or vomiting but has had frequent diarrhea. Denies any fevers. States that she first felt the chest pain after she went on a walk 2 days ago and the pain has not subsided. States it does get worse with movement and is improved with rest. Denies any numbness or tingling. She currently denies any abdominal pain. She went to her primary care physician's office today for evaluation of the chest pain and was noted to have persistently low potassium. An EKG was checked at that point and showed some T-wave inversions at which point the patient was sent to the ER for further evaluation.   Past Medical History:  Diagnosis Date  . Allergic rhinitis   . Anxiety   . Depression   . Diabetes mellitus   . DVT of leg (deep venous thrombosis) ([place]) 12/2007   left leg, given warfarin for 6 mos  . GERD (gastroesophageal reflux disease)   . H/O hiatal hernia   . Hyperlipidemia   . Hypertension   . Morbid obesity ([place])   . Pericarditis    Diagnosed/treated at [facility]; per patient 4 episodes per cardiologist Dr. Edwin Dada persistant chest pain unclear if pericarditis verusus neuropsychogenic   . Renal cell cancer ([place]) 2009   S/p nephrectomy (left)  . Seizure disorder ([place])   . Seizures ([place])   . Shortness of breath   . Syncope      Patient Active Problem List   Diagnosis Date Noted  . Sleep apnea 12/01/2015  . Palpitations 12/01/2015  . Sore throat 11/02/2015  . Status post gastric bypass for obesity 10/03/2015  . Osteoarthritis 10/03/2015  . Anxiety and depression 02/13/2015  . Seizure disorder ([place]) 02/13/2015  . GERD (gastroesophageal reflux disease) 02/13/2015  . Chronic respiratory failure ([place]) 08/28/2013  . Syncope 08/25/2013  . Right sided weakness 08/25/2013  . Vocal cord dysfunction 08/18/2012  . Wheezing 08/16/2012  . Shortness of breath 08/16/2012  . Acute respiratory distress ([facility]) 08/16/2012  . Chest pain 05/09/2011  . Diabetes mellitus ([place])   . Hypertension     Past Surgical History:  Procedure Laterality Date  . ENDOMETRIAL ABLATION  2007  . INSERTION OF MESH  2014  . LEFT HEART CATHETERIZATION WITH CORONARY ANGIOGRAM N/A 08/30/2013   Procedure: LEFT HEART CATHETERIZATION WITH CORONARY ANGIOGRAM;  Surgeon: Troy Sine, MD;  Location: [facility] CATH LAB;  Service: Cardiovascular;  Laterality: N/A;  . NEPHRECTOMY    . TUBAL LIGATION    . UMBILICAL HERNIA REPAIR      Prior to Admission medications   Medication Sig Start Date End Date Taking? Authorizing Provider  aspirin EC 81 MG tablet Take 1 tablet (81 mg total) by mouth daily. 10/02/15  Yes Leone Haven, MD  Cholecalciferol (VITAMIN D-3) 1000 units CAPS Take 1 capsule (1,000 Units total) by mouth daily.  Reported on 06/16/2015 10/02/15  Yes Leone Haven, MD  Multiple Vitamins-Minerals (MULTIVITAMIN WITH MINERALS) tablet Take 1 tablet by mouth daily. Reported on 06/16/2015   Yes Historical Provider, MD  ondansetron (ZOFRAN-ODT) 4 MG disintegrating tablet DISSOLVE ONE TABLET IN MOUTH EVERY 8 HOURS AS NEEDED FOR NAUSEA OR  VOMITING 11/06/15  Yes Leone Haven, MD  promethazine (PHENERGAN) 25 MG tablet Take 1 tablet (25 mg total) by mouth every 8 (eight) hours as needed for nausea or vomiting. Reported on 08/07/2015 10/02/15  Yes Leone Haven, MD  ursodiol (ACTIGALL) 300 MG capsule Take 1 capsule (300 mg total) by mouth 2 (two) times daily. 10/02/15  Yes Leone Haven, MD  vitamin B-12 (CYANOCOBALAMIN) 500 MCG tablet Take 1 tablet (500 mcg total) by mouth daily. Reported on 06/16/2015 10/02/15  Yes Leone Haven, MD  vitamin E 400 UNIT capsule Take 1 capsule (400 Units total) by mouth daily. Reported on 06/16/2015 10/02/15  Yes Leone Haven, MD  amLODipine (NORVASC) 10 MG tablet Take 1 tablet (10 mg total) by mouth daily. 10/02/15   Leone Haven, MD  calcium-vitamin D 250-100 MG-UNIT tablet Take 1 tablet by mouth 2 (two) times daily. Reported on 06/16/2015 10/02/15   Leone Haven, MD  carvedilol (COREG) 25 MG tablet Take 1 tablet (25 mg total) by mouth 2 (two) times daily with a meal. 10/02/15   Leone Haven, MD  gabapentin (NEURONTIN) 300 MG capsule Take 8 capsules (2,400 mg total) by mouth at bedtime. 10/02/15   Leone Haven, MD  hydrochlorothiazide (HYDRODIURIL) 25 MG tablet Take 1 tablet (25 mg total) by mouth daily. 10/02/15   Leone Haven, MD  HYDROcodone-acetaminophen (NORCO/VICODIN) 5-325 MG tablet Take 1 tablet by mouth every 4 (four) hours as needed for moderate pain. 10/02/15   Leone Haven, MD  levETIRAcetam (KEPPRA) 750 MG tablet Take 1,500 mg by mouth 2 (two) times daily. Reported on 10/02/2015    Historical Provider, MD  loratadine (CLARITIN) 10 MG tablet Take 1 tablet (10 mg total) by mouth at bedtime. 10/02/15   Leone Haven, MD  mirtazapine (REMERON) 15 MG tablet Take 1 tablet (15 mg total) by mouth at bedtime. 10/02/15   Leone Haven, MD  nitroGLYCERIN (NITROSTAT) 0.4 MG SL tablet Place 1 tablet (0.4 mg total) under the tongue every 5 (five) minutes as needed for chest pain. (may repeat every 5 minutes but seek medical help if pain persists after 3 tablets) 10/02/15   Leone Haven, MD  omeprazole (PRILOSEC) 40 MG capsule Take 1 capsule (40 mg total) by mouth daily. 10/02/15   Leone Haven, MD  oxyCODONE (OXY IR/ROXICODONE) 5 MG immediate release tablet Take 1 tablet (5 mg total) by mouth every 4 (four) hours as needed for severe pain. Reported on 08/07/2015 Patient taking differently: Take 5 mg by mouth at bedtime. Reported on 08/07/2015 10/02/15   Leone Haven, MD  polyethylene glycol [facility] / Floria Raveling) packet Take 17 g by mouth 2 (two) times daily. Reported on 08/07/2015 10/02/15   Leone Haven, MD  potassium chloride SA (K-DUR,KLOR-CON) 20 MEQ tablet Take 2 tablets (40 mEq total) by mouth 2 (two) times daily. Patient not taking: Reported on 12/01/2015 11/22/15   Coral Spikes, DO  QUEtiapine (SEROQUEL) 100 MG tablet Take 50 mg (1/2 tablet) by mouth daily for 2 days then increase to 100 mg (1 tablet) by mouth daily. 10/02/15   Leone Haven,  MD  sertraline (ZOLOFT) 100 MG tablet Take 50 mg (1/2 tablet) by mouth daily for 1 week, then take 100 mg (1 tablet) by mouth daily for 1 week, then take 150 mg (1.5 tablets) by mouth daily for 1 week, then take 200 mg (2 tablets) by mouth daily Patient taking differently: Take 200 mg by mouth at bedtime.  10/02/15   Leone Haven, MD  Thiamine HCl (VITAMIN B-1) 250 MG tablet Take 1 tablet (250 mg total) by mouth daily. Reported on 06/16/2015 10/02/15   Leone Haven, MD    Allergies Lisinopril and Milk-related compounds  Family History  Problem Relation Age of Onset  . Hypertension Mother   . Breast cancer Mother   . Arthritis Mother   . Heart disease Paternal Grandmother   . Alcoholism Father   . Hyperlipidemia      Parent  . Mental illness      Parent  . Diabetes      Parent    Social History Social History  Substance Use Topics  . Smoking status: Former Research scientist (life sciences)  . Smokeless tobacco: Never Used  . Alcohol use No    Review of Systems Patient denies headaches, rhinorrhea, blurry vision, numbness, shortness of breath, chest pain, edema, cough, abdominal pain, nausea, vomiting, diarrhea, dysuria, fevers, rashes  or hallucinations unless otherwise stated above in HPI. ____________________________________________   PHYSICAL EXAM:  VITAL SIGNS: Vitals:   12/01/15 1631 12/01/15 1900  BP: 132/81 (!) 143/108  Pulse: 88 79  Resp: (!) 25 15  Temp:      Constitutional: Alert and oriented. Fatigued appearing Eyes: Conjunctivae are normal. PERRL. EOMI. Head: Atraumatic. Nose: No congestion/rhinnorhea. Mouth/Throat: Mucous membranes are moist.  Oropharynx non-erythematous. Neck: No stridor. Painless ROM. No cervical spine tenderness to palpation Hematological/Lymphatic/Immunilogical: No cervical lymphadenopathy. Cardiovascular: Normal rate, regular rhythm. Grossly normal heart sounds.  Good peripheral circulation. Respiratory: Normal respiratory effort.  No retractions. Lungs CTAB. Gastrointestinal: Soft and nontender.  PEG tube in place appears c/d/i.   No distention. No abdominal bruits. No CVA tenderness.  Musculoskeletal: No lower extremity tenderness nor edema.  No joint effusions. Neurologic:  Normal speech and language. No gross focal neurologic deficits are appreciated. No gait instability. Skin:  Skin is warm, dry and intact. No rash noted. Psychiatric: Mood and affect are normal. Speech and behavior are normal.  ____________________________________________   LABS (all labs ordered are listed, but only abnormal results are displayed)  Results for orders placed or performed during the hospital encounter of 12/01/15 (from the past 24 hour(s))  Basic metabolic panel     Status: Abnormal   Collection Time: 12/01/15  3:34 PM  Result Value Ref Range   Sodium 140 135 - 145 mmol/L   Potassium 2.8 (L) 3.5 - 5.1 mmol/L   Chloride 101 101 - 111 mmol/L   CO2 29 22 - 32 mmol/L   Glucose, Bld 111 (H) 65 - 99 mg/dL   BUN 11 6 - 20 mg/dL   Creatinine, Ser 0.83 0.44 - 1.00 mg/dL   Calcium 9.4 8.9 - 10.3 mg/dL   GFR calc non Af Amer >60 >60 mL/min   GFR calc Af Amer >60 >60 mL/min   Anion gap 10  5 - 15  CBC     Status: Abnormal   Collection Time: 12/01/15  3:34 PM  Result Value Ref Range   WBC 6.3 3.6 - 11.0 K/uL   RBC 4.92 3.80 - 5.20 MIL/uL   Hemoglobin 14.6 12.0 - 16.0  g/dL   HCT 43.4 35.0 - 47.0 %   MCV 88.3 80.0 - 100.0 fL   MCH 29.8 26.0 - 34.0 pg   MCHC 33.7 32.0 - 36.0 g/dL   RDW 15.9 (H) 11.5 - 14.5 %   Platelets 261 150 - 440 K/uL  Troponin I     Status: None   Collection Time: 12/01/15  3:34 PM  Result Value Ref Range   Troponin I <0.03 <0.03 ng/mL  Magnesium     Status: None   Collection Time: 12/01/15  3:34 PM  Result Value Ref Range   Magnesium 2.1 1.7 - 2.4 mg/dL  Fibrin derivatives D-Dimer ([facility] only)     Status: None   Collection Time: 12/01/15  3:34 PM  Result Value Ref Range   Fibrin derivatives D-dimer ([facility]) 434 0 - 499   ____________________________________________  EKG My review and personal interpretation at Time: 15:25   Indication: chest pain  Rate: 86  Rhythm: nsr Axis: normal Other: diffuse t wave inversions and , down sloping ST segment in inferior distribution ____________________________________________  RADIOLOGY  CXR my read shows no evidence of acute cardiopulmonary process.  ____________________________________________   PROCEDURES  Procedure(s) performed: none    Critical Care performed: no ____________________________________________   INITIAL IMPRESSION / ASSESSMENT AND PLAN / ED COURSE  Pertinent labs & imaging results that were available during my care of the patient were reviewed by me and considered in my medical decision making (see chart for details).  DDX:ACS, pericarditis, PE, dissection, costochondritis, COPD, bronchitis, hyperkalemia  AUREN NAPP is a 50 y.o. who presents to the ED with 2 days of shortness of breath chest pain and chronic hypokalemia failing outpatient management. Patient arrives afebrile and hemodynamic stable. Does appear fatigued but in no acute distress. Patient does have  evidence of newly inverted T waves in inferior and lateral distribution but without any ST elevations to suggest acute MI. She's not had 3 EKGs over the past afternoon without any acute ischemic changes. Troponin drawn in triage is negative for acute ischemia. patient does have a history of DVT and is not currently on any anticoagulation. She is describing some pleuritic chest pain and shortness of breath will order d-dimer to further risk stratify for PE. Chest x-ray ordered due to concern for acute infiltrate shows no acute cardiopulmonary process. Patient does have hypokalemia to 2.8. Will replete with oral and IV potassium. Her exam is not consistent with acute bronchitis. Her abdominal exam is soft and benign. On review of medical records it does show the patient had a history of chronic midsternal chest pain with an abnormal myocardial perfusion scan which she was taken to left heart catheter for further evaluation. At that time she only had 30% stenosis of the right coronary artery. This was back in 2015. She has not had any interval cardiac evaluations. The patient will be placed on continuous pulse oximetry and telemetry for monitoring.  Laboratory evaluation will be sent to evaluate for the above complaints.     Clinical Course  Comment By Time  D-dimer negative. Merlyn Lot, MD 09/01 1735  Patient received oral and IV potassium. Still having chest discomfort at this time. I do not feel this is clinically consistent with pericarditis. Do not feel this clinically consistent with unstable angina but based on her EKG changes and persistent hypokalemia despite outpatient management do feel patient will require admission for serial EKG as well as IV potassium supplementation.  Have discussed with the patient and  available family all diagnostics and treatments performed thus far and all questions were answered to the best of my ability. The patient demonstrates understanding and agreement with plan.   Merlyn Lot, MD 09/01 1808     ____________________________________________   FINAL CLINICAL IMPRESSION(S) / ED DIAGNOSES  Final diagnoses:  Hypokalemia  Chest pain, unspecified chest pain type  Abnormal EKG      NEW MEDICATIONS STARTED DURING THIS VISIT:  New Prescriptions   No medications on file     Note:  This document was prepared using Dragon voice recognition software and may include unintentional dictation errors.    Merlyn Lot, MD 12/01/15 [id number]

## 2015-12-01 NOTE — Assessment & Plan Note (Signed)
Patient with persistent intermittent palpitations since having her gastric bypass surgery earlier this year. Has intermittently had low potassium and could be related to this. EKG performed with no apparent arrhythmia though did reveal new T-wave inversions. Given EKG findings and chest pain she'll be sent to the emergency room for further evaluation of this issue. EMS will transport her.

## 2015-12-01 NOTE — ED Notes (Signed)
2 unsuccessful IV attempts by this RN. Olivia RN going to take a look.

## 2015-12-01 NOTE — H&P (Signed)
[facility] [place] NAME: Denise Davis    MR#:  [id number]  DATE OF BIRTH:  03-14-1966  DATE OF ADMISSION:  12/01/2015  PRIMARY CARE PHYSICIAN: Tommi Rumps, MD   REQUESTING/REFERRING PHYSICIAN: Dr. Merlyn Lot  CHIEF COMPLAINT:   Chief Complaint  Patient presents with  . Chest Pain  . Shortness of Breath    HISTORY OF PRESENT ILLNESS:  Denise Davis  is a 50 y.o. female with a known history of Gastric bypass surgery with complications resulting in PEG tube placement in February 2017, history of DVT, diabetes, hypertension, history of pericarditis, history of renal cell cancer status post left nephrectomy presents to Hospital deferred from PCP office secondary to chest pain. Patient has been having issues with low potassium since her bypass surgery. She supposed to take potassium supplements at home. She has a PEG tube and also can eat by mouth. She has been having intermittent nausea. Her potassium as an outpatient was low so she was placed on supplements and today she went to recheck her blood work. While she was at her PCPs office, she complained of palpitations and suddenly complains of chest pressure and chest tightness which was nonradiating, associated with worsening nausea and diaphoresis. EKG revealed new T wave inversions from V4 through V6. Patient is sent for admission. First troponin is negative in the emergency room. She still has 6/10 chest pain. She is being admitted for potassium supplementation and chest pain workup.  PAST MEDICAL HISTORY:   Past Medical History:  Diagnosis Date  . Allergic rhinitis   . Anxiety   . Depression   . Diabetes mellitus   . DVT of leg (deep venous thrombosis) ([place]) 12/2007   left leg, given warfarin for 6 mos  . GERD (gastroesophageal reflux disease)   . H/O hiatal hernia   . Hyperlipidemia   . Hypertension   . Morbid obesity ([place])   . Pericarditis    Diagnosed/treated at [facility];  per patient 4 episodes per cardiologist Dr. Edwin Dada persistant chest pain unclear if pericarditis verusus neuropsychogenic   . Renal cell cancer ([place]) 2009   S/p nephrectomy (left)  . Seizure disorder ([place])   . Seizures ([place])   . Shortness of breath   . Syncope     PAST SURGICAL HISTORY:   Past Surgical History:  Procedure Laterality Date  . ENDOMETRIAL ABLATION  2007  . INSERTION OF MESH  2014  . LEFT HEART CATHETERIZATION WITH CORONARY ANGIOGRAM N/A 08/30/2013   Procedure: LEFT HEART CATHETERIZATION WITH CORONARY ANGIOGRAM;  Surgeon: Troy Sine, MD;  Location: [facility] CATH [facility];  Service: Cardiovascular;  Laterality: N/A;  . NEPHRECTOMY    . TUBAL LIGATION    . UMBILICAL HERNIA REPAIR      SOCIAL HISTORY:   Social History  Substance Use Topics  . Smoking status: Former Research scientist (life sciences)  . Smokeless tobacco: Never Used  . Alcohol use No    FAMILY HISTORY:   Family History  Problem Relation Age of Onset  . Hypertension Mother   . Breast cancer Mother   . Arthritis Mother   . Heart disease Paternal Grandmother   . Alcoholism Father   . Hyperlipidemia      Parent  . Mental illness      Parent  . Diabetes      Parent    DRUG ALLERGIES:   Allergies  Allergen Reactions  . Lisinopril Cough  . Milk-Related Compounds     Diarrhea  and vomiting  "only milk causes problems. Milk products are ok"    REVIEW OF SYSTEMS:   Review of Systems  Constitutional: Positive for malaise/fatigue. Negative for chills, fever and weight loss.  HENT: Negative for ear discharge, ear pain, hearing loss and nosebleeds.   Eyes: Negative for blurred vision, double vision and photophobia.  Respiratory: Positive for shortness of breath. Negative for cough, hemoptysis and wheezing.   Cardiovascular: Positive for chest pain and palpitations. Negative for orthopnea and leg swelling.  Gastrointestinal: Positive for diarrhea and nausea. Negative for abdominal pain, constipation, heartburn, melena and  vomiting.  Genitourinary: Negative for dysuria, frequency and urgency.  Musculoskeletal: Positive for back pain. Negative for myalgias and neck pain.  Skin: Negative for rash.  Neurological: Negative for dizziness, tingling, tremors, sensory change, speech change, focal weakness and headaches.  Endo/Heme/Allergies: Does not bruise/bleed easily.  Psychiatric/Behavioral: Negative for depression.    MEDICATIONS AT HOME:   Prior to Admission medications   Medication Sig Start Date End Date Taking? Authorizing Provider  aspirin EC 81 MG tablet Take 1 tablet (81 mg total) by mouth daily. 10/02/15  Yes Leone Haven, MD  Cholecalciferol (VITAMIN D-3) 1000 units CAPS Take 1 capsule (1,000 Units total) by mouth daily. Reported on 06/16/2015 10/02/15  Yes Leone Haven, MD  Multiple Vitamins-Minerals (MULTIVITAMIN WITH MINERALS) tablet Take 1 tablet by mouth daily. Reported on 06/16/2015   Yes Historical Provider, MD  ondansetron (ZOFRAN-ODT) 4 MG disintegrating tablet DISSOLVE ONE TABLET IN MOUTH EVERY 8 HOURS AS NEEDED FOR NAUSEA OR  VOMITING 11/06/15  Yes Leone Haven, MD  promethazine (PHENERGAN) 25 MG tablet Take 1 tablet (25 mg total) by mouth every 8 (eight) hours as needed for nausea or vomiting. Reported on 08/07/2015 10/02/15  Yes Leone Haven, MD  ursodiol (ACTIGALL) 300 MG capsule Take 1 capsule (300 mg total) by mouth 2 (two) times daily. 10/02/15  Yes Leone Haven, MD  vitamin B-12 (CYANOCOBALAMIN) 500 MCG tablet Take 1 tablet (500 mcg total) by mouth daily. Reported on 06/16/2015 10/02/15  Yes Leone Haven, MD  vitamin E 400 UNIT capsule Take 1 capsule (400 Units total) by mouth daily. Reported on 06/16/2015 10/02/15  Yes Leone Haven, MD  amLODipine (NORVASC) 10 MG tablet Take 1 tablet (10 mg total) by mouth daily. 10/02/15   Leone Haven, MD  calcium-vitamin D 250-100 MG-UNIT tablet Take 1 tablet by mouth 2 (two) times daily. Reported on 06/16/2015 10/02/15   Leone Haven, MD  carvedilol (COREG) 25 MG tablet Take 1 tablet (25 mg total) by mouth 2 (two) times daily with a meal. 10/02/15   Leone Haven, MD  gabapentin (NEURONTIN) 300 MG capsule Take 8 capsules (2,400 mg total) by mouth at bedtime. 10/02/15   Leone Haven, MD  hydrochlorothiazide (HYDRODIURIL) 25 MG tablet Take 1 tablet (25 mg total) by mouth daily. 10/02/15   Leone Haven, MD  HYDROcodone-acetaminophen (NORCO/VICODIN) 5-325 MG tablet Take 1 tablet by mouth every 4 (four) hours as needed for moderate pain. 10/02/15   Leone Haven, MD  levETIRAcetam (KEPPRA) 750 MG tablet Take 1,500 mg by mouth 2 (two) times daily. Reported on 10/02/2015    Historical Provider, MD  loratadine (CLARITIN) 10 MG tablet Take 1 tablet (10 mg total) by mouth at bedtime. 10/02/15   Leone Haven, MD  mirtazapine (REMERON) 15 MG tablet Take 1 tablet (15 mg total) by mouth at bedtime. 10/02/15   Randall Hiss  Carolan Shiver, MD  nitroGLYCERIN (NITROSTAT) 0.4 MG SL tablet Place 1 tablet (0.4 mg total) under the tongue every 5 (five) minutes as needed for chest pain. (may repeat every 5 minutes but seek medical help if pain persists after 3 tablets) 10/02/15   Leone Haven, MD  omeprazole (PRILOSEC) 40 MG capsule Take 1 capsule (40 mg total) by mouth daily. 10/02/15   Leone Haven, MD  oxyCODONE (OXY IR/ROXICODONE) 5 MG immediate release tablet Take 1 tablet (5 mg total) by mouth every 4 (four) hours as needed for severe pain. Reported on 08/07/2015 Patient taking differently: Take 5 mg by mouth at bedtime. Reported on 08/07/2015 10/02/15   Leone Haven, MD  polyethylene glycol [facility] Floria Raveling) packet Take 17 g by mouth 2 (two) times daily. Reported on 08/07/2015 10/02/15   Leone Haven, MD  potassium chloride SA (K-DUR,KLOR-CON) 20 MEQ tablet Take 2 tablets (40 mEq total) by mouth 2 (two) times daily. Patient not taking: Reported on 12/01/2015 11/22/15   Coral Spikes, DO  QUEtiapine (SEROQUEL) 100 MG tablet Take 50  mg (1/2 tablet) by mouth daily for 2 days then increase to 100 mg (1 tablet) by mouth daily. 10/02/15   Leone Haven, MD  sertraline (ZOLOFT) 100 MG tablet Take 50 mg (1/2 tablet) by mouth daily for 1 week, then take 100 mg (1 tablet) by mouth daily for 1 week, then take 150 mg (1.5 tablets) by mouth daily for 1 week, then take 200 mg (2 tablets) by mouth daily Patient taking differently: Take 200 mg by mouth at bedtime.  10/02/15   Leone Haven, MD  Thiamine HCl (VITAMIN B-1) 250 MG tablet Take 1 tablet (250 mg total) by mouth daily. Reported on 06/16/2015 10/02/15   Leone Haven, MD      VITAL SIGNS:  Blood pressure 132/81, pulse 88, temperature 98.6 F (37 C), temperature source Oral, resp. rate (!) 25, height 5\' 5"  (1.651 m), weight 124.3 kg (274 lb), SpO2 100 %.  PHYSICAL EXAMINATION:   Physical Exam  GENERAL:  50 y.o.-year-old obese patient lying in the bed with no acute distress.  EYES: Pupils equal, round, reactive to light and accommodation. No scleral icterus. Extraocular muscles intact.  HEENT: Head atraumatic, normocephalic. Oropharynx and nasopharynx clear.  NECK:  Supple, no jugular venous distention. No thyroid enlargement, no tenderness.  LUNGS: Normal breath sounds bilaterally, no wheezing, rales,rhonchi or crepitation. No use of accessory muscles of respiration. Decreased bibasilar breath sounds CARDIOVASCULAR: S1, S2 normal. No murmurs, rubs, or gallops.  ABDOMEN: Soft, nontender, nondistended. Bowel sounds present. No organomegaly or mass. PEG tube in place, EXTREMITIES: No pedal edema, cyanosis, or clubbing.  NEUROLOGIC: Cranial nerves II through XII are intact. Muscle strength 5/5 in all extremities. Sensation intact. Gait not checked.  PSYCHIATRIC: The patient is alert and oriented x 3.  SKIN: No obvious rash, lesion, or ulcer.   LABORATORY PANEL:   CBC  Recent Labs Lab 12/01/15 1534  WBC 6.3  HGB 14.6  HCT 43.4  PLT 261    ------------------------------------------------------------------------------------------------------------------  Chemistries   Recent Labs Lab 12/01/15 1534  NA 140  K 2.8*  CL 101  CO2 29  GLUCOSE 111*  BUN 11  CREATININE 0.83  CALCIUM 9.4  MG 2.1   ------------------------------------------------------------------------------------------------------------------  Cardiac Enzymes  Recent Labs Lab 12/01/15 1534  TROPONINI <0.03   ------------------------------------------------------------------------------------------------------------------  RADIOLOGY:  Dg Chest 2 View  Result Date: 12/01/2015 CLINICAL DATA:  50 year old female with  chest pain cough shortness of breath for 2 days. Initial encounter. EXAM: CHEST  2 VIEW COMPARISON:  Chest radiographs 02/14/2015 and earlier. FINDINGS: Improved lung volumes. Mediastinal contours remain normal. Visualized tracheal air column is within normal limits. No pneumothorax, pulmonary edema, pleural effusion or confluent pulmonary opacity. Mild dextro convex curvature of the thoracic spine. No acute osseous abnormality identified. IMPRESSION: No acute cardiopulmonary abnormality. Electronically Signed   By: Genevie Ann M.D.   On: 12/01/2015 16:15    EKG:   Orders placed or performed during the hospital encounter of 12/01/15  . EKG 12-Lead  . EKG 12-Lead  . EKG 12-Lead  . EKG 12-Lead  . ED EKG within 10 minutes  . ED EKG within 10 minutes    IMPRESSION AND PLAN:   Denise Davis  is a 50 y.o. female with a known history of Gastric bypass surgery with complications resulting in PEG tube placement in February 2017, history of DVT, diabetes, hypertension, history of pericarditis, history of renal cell cancer status post left nephrectomy presents to Hospital deferred from PCP office secondary to chest pain.   #1 chest pain-possible angina versus pericarditis. -admit under observation. Recycle troponins. -Echocardiogram and Myoview  in a.m. - continue aspirin, Coreg, nitro when necessary.  #2 hypokalemia-acute on chronic. -Replaced through fluids and also potassium packets through her PEG tube instead of orally.  If doesn't improve still, could be absorption issues - magnesium is within normal limits  #3 HTN- hold HCTZ due to chronic hypokalemia - continue coreg and norvasc  #4 Depression- continue home meds  #5 DVT Prophylaxis-  SQ heparin    All the records are reviewed and case discussed with ED provider. Management plans discussed with the patient, family and they are in agreement.  CODE STATUS: Full Code  TOTAL TIME TAKING CARE OF THIS PATIENT: 50 minutes.    Gladstone Lighter M.D on 12/01/2015 at 6:44 PM  Between 7am to 6pm - Pager - [phone]  After 6pm go to [email or web address] - password [email or web address] [facility]  Office  [phone]  CC: Primary care physician; Tommi Rumps, MD

## 2015-12-01 NOTE — ED Triage Notes (Signed)
Patient states she has had mid chest tightness and shortness of breath for two days. Patient states she saw her PMD today after a blood draw showed hypokalemia and they did an EKG and sent her here.

## 2015-12-01 NOTE — ED Notes (Signed)
Called pharmacy to get potassium IVPB sent to ED

## 2015-12-01 NOTE — Progress Notes (Signed)
  Tommi Rumps, MD Phone: [phone]  Denise Davis is a 50 y.o. female who presents today for follow-up.  Obstructive sleep apnea: Patient has not been able to use her CPAP since she had gastric bypass surgery in January. Notes the fullface mask does not fit anymore. She needs new supplies. We have materials faxed from her DME company that need to be signed. She notes she feels tired all the time. Not well rested. Falls asleep while watching TV. Notes apnea and snoring. Had been using her machine daily prior to her surgery.  Palpitations: Patient notes she has intermittently had issues with low potassium since her bypass surgery. Notes she had lab work done last week that revealed a low potassium. She took the supplement. She does occasionally note some palpitations. Feels as though her heart is beating heavy and fast. Mild tightness with it. It occurs several times a day typically. No recent EKG. Patient had a normal stress test in June 2016.  Patient does note having had intermittent chest tightness since this morning. Some associated shortness of breath. Also felt clammy this morning. Initially felt okay in the office though after doing an EKG she noted onset of chest tightness and shortness of breath. No radiation. No diaphoresis right now. EKG was done that revealed new T-wave inversions in V4 through V6.  PMH: Former smoker   ROS see history of present illness  Objective  Physical Exam Vitals:   12/01/15 1340  BP: 132/84  Pulse: 98  Temp: 98.4 F (36.9 C)    BP Readings from Last 3 Encounters:  12/01/15 132/84  11/02/15 128/86  10/02/15 136/84   Wt Readings from Last 3 Encounters:  12/01/15 274 lb 9.6 oz (124.6 kg)  11/02/15 277 lb 12.8 oz (126 kg)  10/02/15 294 lb 3.2 oz (133.4 kg)    Physical Exam  Constitutional: No distress.  Cardiovascular: Normal rate, regular rhythm and normal heart sounds.   Pulmonary/Chest: Effort normal and breath sounds normal.    Musculoskeletal: She exhibits no edema.  Neurological: She is alert. Gait normal.  Skin: Skin is warm and dry. She is not diaphoretic.   EKG: Normal sinus rhythm, rate 89, inverted T waves V2 through V6 with new T-wave inversions in V4 through V6  Assessment/Plan: Please see individual problem list.  Sleep apnea Patient with obstructive sleep apnea. Not currently using her CPAP as she does not have the appropriate equipment. We will sign her DME equipment form. Prior sleep study printed out and will be faxed with her form.  Palpitations Patient with persistent intermittent palpitations since having her gastric bypass surgery earlier this year. Has intermittently had low potassium and could be related to this. EKG performed with no apparent arrhythmia though did reveal new T-wave inversions. Given EKG findings and chest pain she'll be sent to the emergency room for further evaluation of this issue. EMS will transport her.  Chest pain Patient notes intermittent chest discomfort and shortness of breath starting earlier today with some clamminess. Recurred in the office with new T-wave inversions on EKG. Patient was given an aspirin in the office. She reports history of blood pressure dropping with nitroglycerin thus this was deferred at this time. EMS was called to transport the patient to the ED for evaluation.   Orders Placed This Encounter  Procedures  . EKG 12-Lead    Tommi Rumps, MD [place]

## 2015-12-01 NOTE — Assessment & Plan Note (Addendum)
Patient with obstructive sleep apnea. Not currently using her CPAP as she does not have the appropriate equipment. We will sign her DME equipment form. Prior sleep study printed out and will be faxed with her form.

## 2015-12-01 NOTE — Progress Notes (Signed)
Pt. Arrived to unit via stretcher, tele applied, running NSR, verified with Estill Bamberg, Therapist, sports. Skin warm and dry with no issues observed, verified with Crystal, RN. General room orientation given, IV fluids and K+ started. Instruction on how to use ascom and call bell system given. Pt. Alert and oriented with no c/o pain, SOB or acute distress noted. Sandwich box given, pt. Ate entire box. Will continue to monitor pt.

## 2015-12-02 ENCOUNTER — Observation Stay (HOSPITAL_BASED_OUTPATIENT_CLINIC_OR_DEPARTMENT_OTHER): Payer: PPO

## 2015-12-02 DIAGNOSIS — R0789 Other chest pain: Secondary | ICD-10-CM

## 2015-12-02 DIAGNOSIS — R079 Chest pain, unspecified: Secondary | ICD-10-CM | POA: Diagnosis not present

## 2015-12-02 DIAGNOSIS — E876 Hypokalemia: Secondary | ICD-10-CM | POA: Diagnosis not present

## 2015-12-02 LAB — NM MYOCAR MULTI W/SPECT W/WALL MOTION / EF
CHL CUP NUCLEAR SRS: 1
CSEPPHR: 103 {beats}/min
LV sys vol: 26 mL
LVDIAVOL: 69 mL (ref 46–106)
Rest HR: 74 {beats}/min
SDS: 0
SSS: 0
TID: 0.67

## 2015-12-02 LAB — ECHOCARDIOGRAM COMPLETE
Height: 65 in
Weight: 4384 oz

## 2015-12-02 LAB — BASIC METABOLIC PANEL
ANION GAP: 7 (ref 5–15)
BUN: 12 mg/dL (ref 6–20)
CHLORIDE: 107 mmol/L (ref 101–111)
CO2: 29 mmol/L (ref 22–32)
Calcium: 8.7 mg/dL — ABNORMAL LOW (ref 8.9–10.3)
Creatinine, Ser: 0.69 mg/dL (ref 0.44–1.00)
GFR calc Af Amer: >60 mL/min (ref >60)
GLUCOSE: 95 mg/dL (ref 65–99)
POTASSIUM: 3.1 mmol/L — AB (ref 3.5–5.1)
SODIUM: 143 mmol/L (ref 135–145)

## 2015-12-02 LAB — HEMOGLOBIN A1C: HEMOGLOBIN A1C: 5.6 % (ref 4.0–6.0)

## 2015-12-02 LAB — TROPONIN I
Troponin I: <0.03 ng/mL (ref <0.03)
Troponin I: <0.03 ng/mL (ref <0.03)

## 2015-12-02 LAB — TSH: TSH: 1.052 u[IU]/mL (ref 0.350–4.500)

## 2015-12-02 MED ORDER — OMEPRAZOLE 40 MG PO CPDR: 40.0000 mg | DELAYED_RELEASE_CAPSULE | Freq: Two times a day (BID) | ORAL | 0 refills | Status: DC

## 2015-12-02 MED ORDER — ADULT MULTIVITAMIN LIQUID CH
15.0000 mL | Freq: Every day | ORAL | Status: DC
  Filled 2015-12-02: qty 15

## 2015-12-02 MED ORDER — TECHNETIUM TC 99M TETROFOSMIN IV KIT
32.3700 | PACK | Freq: Once | INTRAVENOUS | Status: AC | PRN
  Administered 2015-12-02: 32.37 via INTRAVENOUS

## 2015-12-02 MED ORDER — OSMOLITE 1.5 CAL PO LIQD: 237.0000 mL | Freq: Two times a day (BID) | ORAL | Status: DC

## 2015-12-02 MED ORDER — REGADENOSON 0.4 MG/5ML IV SOLN
0.4000 mg | Freq: Once | INTRAVENOUS | Status: AC
  Administered 2015-12-02: 0.4 mg via INTRAVENOUS

## 2015-12-02 MED ORDER — TECHNETIUM TC 99M TETROFOSMIN IV KIT
13.7800 | PACK | Freq: Once | INTRAVENOUS | Status: AC | PRN
  Administered 2015-12-02: 13.78 via INTRAVENOUS

## 2015-12-02 NOTE — Progress Notes (Signed)
Pt. signed consent, placed on chart, education given for stress test.

## 2015-12-02 NOTE — Progress Notes (Signed)
Initial Nutrition Assessment    INTERVENTION:  -Pt is a very high risk for developing malnutrition and vitamin/mineral deficiency; recommend follow-up with RD as outpatient. Discussed with pt.  -Reinforced the importance of adequate protein and adequate fluid intake. Pt reports she has some of her bariatric oral protein supplement at home but has not been using. Encouraged pt to take orally as able and to consider giving protein supplement via PEG tube if not able to take orally.  -Discussed alternatives to the bolus feeding with pt; due to hx of gastric bypass with chronic N/V pt may better tolerate intermittent feedings via pump or even maybe night time feedings at a slower rate.  -Recommend changing MVI to liquid as pt taking via tube  NUTRITION DIAGNOSIS:   Inadequate oral intake related to altered GI function as evidenced by per patient/family report.  GOAL:   Patient will meet greater than or equal to 90% of their needs   MONITOR:   PO intake, TF tolerance, Labs, Weight trends  REASON FOR ASSESSMENT:   Consult Enteral/tube feeding initiation and management  ASSESSMENT:    50 yo female admitted with chest pain. Pt with hx of gastric bypass surgery in Jan 2017 with PEG tube placement Feb 2017.  Pt had stress test this AM, results pending. Diet ordered, pt has not yet eaten a meal today but reports good appetite at present  Pt reports chronic nausea, also reports issues with food, especially meats, getting stuck in her esophagus. Pt reports she has follow-up appointment with Dr. Hassell Done regarding possible revision of bariatric surgery and possible "stent" placement in her esophagus. Pt reports she does eat food, some days she eats nothing and some days she does fairly well. Pt reports she is supposed to be taking 3 cans of Osmolite 1.5 per day but usually is only able to take 1.5 cans.    Pt reports 10 pound wt loss since August 20th, significant wt loss since Jan. Pt reports she  tries to take her bariatric MVI at home but it is a tablet and gets stuck in her esophagus. Pt has been grinding up and putting in her feeding tube but does not do this every day  Past Medical History:  Diagnosis Date  . Allergic rhinitis   . Anxiety   . Depression   . Diabetes mellitus   . DVT of leg (deep venous thrombosis) ([place]) 12/2007   left leg, given warfarin for 6 mos  . GERD (gastroesophageal reflux disease)   . H/O hiatal hernia   . Hyperlipidemia   . Hypertension   . Morbid obesity ([place])   . Pericarditis    Diagnosed/treated at [facility]; per patient 4 episodes per cardiologist Dr. Edwin Dada persistant chest pain unclear if pericarditis verusus neuropsychogenic   . Renal cell cancer ([place]) 2009   S/p nephrectomy (left)  . Seizure disorder ([place])   . Seizures ([place])   . Shortness of breath   . Syncope     Diet Order:  Diet heart healthy/carb modified Room service appropriate? Yes; Fluid consistency: Thin  Skin:  Reviewed, no issues  Last BM:  8/31   Labs: potassium 3.1  Meds: MVI tablet, chewable calcium citrate-vitamin D, NS with KCl at 75 ml/hr, vitamin D, thiamine, vitamin B12, vitamin E  Height:   Ht Readings from Last 1 Encounters:  12/01/15 5\' 5"  (1.651 m)    Weight:   Wt Readings from Last 1 Encounters:  12/01/15 270 lb 6.4 oz (122.7 kg)  Wt Readings from Last 10 Encounters:  12/01/15 270 lb 6.4 oz (122.7 kg)  12/01/15 274 lb 9.6 oz (124.6 kg)  11/02/15 277 lb 12.8 oz (126 kg)  10/02/15 294 lb 3.2 oz (133.4 kg)  09/27/15 290 lb (131.5 kg)  08/07/15 (!) 312 lb (141.5 kg)  06/16/15 (!) 342 lb (155.1 kg)  02/13/15 (!) 374 lb 6.4 oz (169.8 kg)  12/29/14 (!) 364 lb (165.1 kg)  11/03/14 (!) 364 lb (165.1 kg)    BMI:  Body mass index is 45 kg/m.  Estimated Nutritional Needs:   Kcal:  1800-2000 kcals   Protein:  >/= 60 g  Fluid:  >/= 2 L  EDUCATION NEEDS:   Education needs addressed Kerman Passey [place], [place], LDN [phone] Pager  [phone] Weekend/On-Call Pager

## 2015-12-02 NOTE — Care Management Obs Status (Signed)
[facility] NOTIFICATION   Patient Details  Name: Denise Davis MRN: [id number] Date of Birth: 12-Oct-1965   Medicare Observation Status Notification Given:  No (discharge order less than 24 hours)    Ival Bible, RN 12/02/2015, 3:31 PM

## 2015-12-02 NOTE — Discharge Instructions (Signed)
Resume diet and activity as before ° ° °

## 2015-12-02 NOTE — Progress Notes (Signed)
A & O. Pt refused bolus feedings. NSR. Room air. Pt reports no pain. Stress test was negative. IV and tele removed. Discharge instructions given to pt. Prescription given to pt. Pt has no further concerns at this time.

## 2015-12-05 DIAGNOSIS — M1712 Unilateral primary osteoarthritis, left knee: Secondary | ICD-10-CM | POA: Diagnosis not present

## 2015-12-05 DIAGNOSIS — M1711 Unilateral primary osteoarthritis, right knee: Secondary | ICD-10-CM | POA: Diagnosis not present

## 2015-12-07 ENCOUNTER — Telehealth: Payer: Self-pay | Admitting: Family Medicine

## 2015-12-07 NOTE — Telephone Encounter (Signed)
FYI about your HFU tomorrow.

## 2015-12-07 NOTE — Telephone Encounter (Signed)
Noted  

## 2015-12-07 NOTE — Telephone Encounter (Signed)
FYI, Pt was discharged from the hospital on 12/02/15. Dx was Dehydrated and potssium low. Pt is scheduled to come in 12/08/15. Thank you!

## 2015-12-08 ENCOUNTER — Ambulatory Visit (INDEPENDENT_AMBULATORY_CARE_PROVIDER_SITE_OTHER): Payer: PPO | Admitting: Family Medicine

## 2015-12-08 ENCOUNTER — Encounter: Payer: Self-pay | Admitting: Family Medicine

## 2015-12-08 ENCOUNTER — Other Ambulatory Visit: Payer: Self-pay | Admitting: Family Medicine

## 2015-12-08 VITALS — BP 130/84 | HR 91 | Temp 98.4°F | Wt 274.4 lb

## 2015-12-08 DIAGNOSIS — Z9884 Bariatric surgery status: Secondary | ICD-10-CM | POA: Diagnosis not present

## 2015-12-08 DIAGNOSIS — R079 Chest pain, unspecified: Secondary | ICD-10-CM

## 2015-12-08 DIAGNOSIS — R002 Palpitations: Secondary | ICD-10-CM | POA: Diagnosis not present

## 2015-12-08 DIAGNOSIS — E876 Hypokalemia: Secondary | ICD-10-CM

## 2015-12-08 LAB — BASIC METABOLIC PANEL
BUN: 15 mg/dL (ref 6–23)
CHLORIDE: 103 meq/L (ref 96–112)
CO2: 28 meq/L (ref 19–32)
CREATININE: 0.8 mg/dL (ref 0.40–1.20)
Calcium: 9.7 mg/dL (ref 8.4–10.5)
GFR: 97.54 mL/min (ref >60.00)
GLUCOSE: 96 mg/dL (ref 70–99)
Potassium: 3 mEq/L — ABNORMAL LOW (ref 3.5–5.1)
Sodium: 140 mEq/L (ref 135–145)

## 2015-12-08 MED ORDER — POTASSIUM CHLORIDE 20 MEQ/15ML (10%) PO SOLN: 40.0000 meq | Freq: Every day | ORAL | 0 refills | Status: DC

## 2015-12-08 NOTE — Progress Notes (Signed)
Tommi Rumps, MD Phone: [phone]  Denise Davis is a 50 y.o. female who presents today for follow-up.  Patient seen one week ago for follow-up and found to have chest pain with palpitations and low potassium. She was sent to the emergency room for evaluation and was admitted to the hospital. She had a low risk stress test while admitted. Negative troponins 4. Echo with EF of 60-65% and grade 1 diastolic dysfunction. Notes her potassium was repleted. Prior to discharge it was 3.1. She notes she was not discharged on any supplements. She notes still having some persistent mild tightness in her chest though no shortness of breath or diaphoresis. Has not worsened. No exertional component. Does have some anxiety. She has tried to get water in through her feeding tube though she gets diarrhea and cramping whenever she does this. She did see a nutritionist in the hospital and they recommended feeding tube supplements though she has been unable to do this due to fear of cramping and diarrhea. She has no diarrhea or cramping when she does not put anything through the tube or eat anything. Notes she is seeing surgery for follow-up of her gastric bypass to see if there is potential for revision next week.  PMH: Former smoker   ROS see history of present illness  Objective  Physical Exam Vitals:   12/08/15 1120  BP: 130/84  Pulse: 91  Temp: 98.4 F (36.9 C)    BP Readings from Last 3 Encounters:  12/08/15 130/84  12/02/15 127/75  12/01/15 132/84   Wt Readings from Last 3 Encounters:  12/08/15 274 lb 6.4 oz (124.5 kg)  12/01/15 270 lb 6.4 oz (122.7 kg)  12/01/15 274 lb 9.6 oz (124.6 kg)    Physical Exam  Constitutional: No distress.  HENT:  Head: Normocephalic and atraumatic.  Mouth/Throat: Oropharynx is clear and moist.  Cardiovascular: Normal rate, regular rhythm and normal heart sounds.   Pulmonary/Chest: Effort normal and breath sounds normal.  Abdominal: Soft. Bowel  sounds are normal. She exhibits no distension. There is no rebound and no guarding.  Feeding tube noted left abdomen, mild soreness throughout though no reported tenderness  Musculoskeletal: She exhibits no edema.  Neurological: She is alert. Gait normal.  Skin: Skin is warm and dry. She is not diaphoretic.     Assessment/Plan: Please see individual problem list.  Chest pain Patient underwent significant evaluation for cardiac cause of this. She had negative workup. Negative d-dimer. Chest x-ray reassuring. Suspect related to anxiety. Discussed continuing her antianxiety medications. Monitoring for further symptoms. Given return precautions.  Palpitations No recurrence. Had significant workup that was negative for cause other than low potassium. She'll monitor for recurrence.  Status post gastric bypass for obesity Continues to be a significant issue. She has significant nausea, cramping, and diarrhea with any intake of liquids or solids. I encouraged her to at least get some nutrition through her feeding tube with bariatric supplements. I encouraged her to see her surgeon next week as scheduled and discuss seeing a nutritionist through them if possible. The will start a liquid multivitamin. At this point I'm unsure what else to offer her regarding this issue. She is given return precautions.  Hypokalemia Suspect this is due to poor nutritional intake. We will recheck a BMP today. If still low we'll start on liquid or powdered potassium supplementation.   Orders Placed This Encounter  Procedures  . Basic metabolic panel    No orders of the defined types were placed in  this encounter.   Tommi Rumps, MD [place]

## 2015-12-08 NOTE — Assessment & Plan Note (Signed)
Continues to be a significant issue. She has significant nausea, cramping, and diarrhea with any intake of liquids or solids. I encouraged her to at least get some nutrition through her feeding tube with bariatric supplements. I encouraged her to see her surgeon next week as scheduled and discuss seeing a nutritionist through them if possible. The will start a liquid multivitamin. At this point I'm unsure what else to offer her regarding this issue. She is given return precautions.

## 2015-12-08 NOTE — Patient Instructions (Addendum)
Nice to see you. We're going to check her potassium again. You should follow up with the surgeon as scheduled next week. If you develop chest pain, shortness of breath, abdominal pain, blood in her stool, palpitations, or any new or changing symptoms please seek medical attention.

## 2015-12-08 NOTE — Assessment & Plan Note (Signed)
Suspect this is due to poor nutritional intake. We will recheck a BMP today. If still low we'll start on liquid or powdered potassium supplementation.

## 2015-12-08 NOTE — Assessment & Plan Note (Addendum)
Patient underwent significant evaluation for cardiac cause of this. She had negative workup. Negative d-dimer. Chest x-ray reassuring. Suspect related to anxiety. Discussed continuing her antianxiety medications. Monitoring for further symptoms. Given return precautions.

## 2015-12-08 NOTE — Assessment & Plan Note (Signed)
No recurrence. Had significant workup that was negative for cause other than low potassium. She'll monitor for recurrence.

## 2015-12-08 NOTE — Progress Notes (Signed)
Pre visit review using our clinic review tool, if applicable. No additional management support is needed unless otherwise documented below in the visit note. 

## 2015-12-08 NOTE — Telephone Encounter (Signed)
Patient did not populate on transitional care management list.  Discharge date qualifies as a standard hospital follow up.  No TCM at this time.

## 2015-12-09 ENCOUNTER — Other Ambulatory Visit: Payer: Self-pay | Admitting: Family Medicine

## 2015-12-09 DIAGNOSIS — E876 Hypokalemia: Secondary | ICD-10-CM

## 2015-12-10 NOTE — Discharge Summary (Signed)
[facility] [place] NAME: Denise Davis    MR#:  [id number]  DATE OF BIRTH:  01-25-1966  DATE OF ADMISSION:  12/01/2015 ADMITTING PHYSICIAN: Gladstone Lighter, MD  DATE OF DISCHARGE: 12/02/2015  2:53 PM  PRIMARY CARE PHYSICIAN: Tommi Rumps, MD   ADMISSION DIAGNOSIS:  Hypokalemia [E87.6] Abnormal EKG [R94.31] Chest pain, unspecified chest pain type [R07.9]  DISCHARGE DIAGNOSIS:  Active Problems:   Chest pain   SECONDARY DIAGNOSIS:   Past Medical History:  Diagnosis Date  . Allergic rhinitis   . Anxiety   . Depression   . Diabetes mellitus   . DVT of leg (deep venous thrombosis) ([place]) 12/2007   left leg, given warfarin for 6 mos  . GERD (gastroesophageal reflux disease)   . H/O hiatal hernia   . Hyperlipidemia   . Hypertension   . Morbid obesity ([place])   . Pericarditis    Diagnosed/treated at [facility]; per patient 4 episodes per cardiologist Dr. Edwin Dada persistant chest pain unclear if pericarditis verusus neuropsychogenic   . Renal cell cancer ([place]) 2009   S/p nephrectomy (left)  . Seizure disorder ([place])   . Seizures ([place])   . Shortness of breath   . Syncope      ADMITTING HISTORY  Denise Davis  is a 50 y.o. female with a known history of Gastric bypass surgery with complications resulting in PEG tube placement in February 2017, history of DVT, diabetes, hypertension, history of pericarditis, history of renal cell cancer status post left nephrectomy presents to Hospital deferred from PCP office secondary to chest pain. Patient has been having issues with low potassium since her bypass surgery. She supposed to take potassium supplements at home. She has a PEG tube and also can eat by mouth. She has been having intermittent nausea. Her potassium as an outpatient was low so she was placed on supplements and today she went to recheck her blood work. While she was at her PCPs office, she complained of palpitations and  suddenly complains of chest pressure and chest tightness which was nonradiating, associated with worsening nausea and diaphoresis. EKG revealed new T wave inversions from V4 through V6. Patient is sent for admission. First troponin is negative in the emergency room. She still has 6/10 chest pain. She is being admitted for potassium supplementation and chest pain workup.  HOSPITAL COURSE:   * Chest pain Likely due to GERD. Patient had troponins which were normal. Telemetry showed no arrhythmias. Stress test showed no reversible ischemia. Patient will be treated with PPIs.  * Hypokalemia Replace through IV and oral. Patient is on potassium supplements at home.  Stable for discharge home to follow-up with PCP.  CONSULTS OBTAINED:    DRUG ALLERGIES:   Allergies  Allergen Reactions  . Lisinopril Cough  . Milk-Related Compounds     Diarrhea and vomiting  "only milk causes problems. Milk products are ok"    DISCHARGE MEDICATIONS:   Discharge Medication List as of 12/02/2015  2:06 PM    CONTINUE these medications which have CHANGED   Details  omeprazole (PRILOSEC) 40 MG capsule Take 1 capsule (40 mg total) by mouth 2 (two) times daily before a meal., Starting Sat 12/02/2015, Print      CONTINUE these medications which have NOT CHANGED   Details  amLODipine (NORVASC) 10 MG tablet Take 1 tablet (10 mg total) by mouth daily., Starting 10/02/2015, Until Discontinued, Normal    aspirin EC 81 MG tablet Take 1 tablet (  81 mg total) by mouth daily., Starting 10/02/2015, Until Discontinued, Normal    calcium-vitamin D 250-100 MG-UNIT tablet Take 1 tablet by mouth 2 (two) times daily. Reported on 06/16/2015, Starting 10/02/2015, Until Discontinued, Normal    carvedilol (COREG) 25 MG tablet Take 1 tablet (25 mg total) by mouth 2 (two) times daily with a meal., Starting 10/02/2015, Until Discontinued, Normal    Cholecalciferol (VITAMIN D-3) 1000 units CAPS Take 1 capsule (1,000 Units total) by mouth daily.  Reported on 06/16/2015, Starting 10/02/2015, Until Discontinued, Normal    gabapentin (NEURONTIN) 300 MG capsule Take 8 capsules (2,400 mg total) by mouth at bedtime., Starting 10/02/2015, Until Discontinued, Normal    hydrochlorothiazide (HYDRODIURIL) 25 MG tablet Take 1 tablet (25 mg total) by mouth daily., Starting 10/02/2015, Until Discontinued, Normal    mirtazapine (REMERON) 15 MG tablet Take 1 tablet (15 mg total) by mouth at bedtime., Starting 10/02/2015, Until Discontinued, Normal    Multiple Vitamins-Minerals (MULTIVITAMIN WITH MINERALS) tablet Take 1 tablet by mouth daily. Reported on 06/16/2015, Until Discontinued, Historical Med    nitroGLYCERIN (NITROSTAT) 0.4 MG SL tablet Place 1 tablet (0.4 mg total) under the tongue every 5 (five) minutes as needed for chest pain. (may repeat every 5 minutes but seek medical help if pain persists after 3 tablets), Starting 10/02/2015, Until Discontinued, Print    ondansetron (ZOFRAN-ODT) 4 MG disintegrating tablet DISSOLVE ONE TABLET IN MOUTH EVERY 8 HOURS AS NEEDED FOR NAUSEA OR  VOMITING, Normal    promethazine (PHENERGAN) 25 MG tablet Take 1 tablet (25 mg total) by mouth every 8 (eight) hours as needed for nausea or vomiting. Reported on 08/07/2015, Starting 10/02/2015, Until Discontinued, Normal    QUEtiapine (SEROQUEL) 100 MG tablet Take 100 mg by mouth at bedtime., Historical Med    sertraline (ZOLOFT) 100 MG tablet Take 50 mg (1/2 tablet) by mouth daily for 1 week, then take 100 mg (1 tablet) by mouth daily for 1 week, then take 150 mg (1.5 tablets) by mouth daily for 1 week, then take 200 mg (2 tablets) by mouth daily, Print    ursodiol (ACTIGALL) 300 MG capsule Take 1 capsule (300 mg total) by mouth 2 (two) times daily., Starting 10/02/2015, Until Discontinued, Normal    vitamin B-12 (CYANOCOBALAMIN) 500 MCG tablet Take 1 tablet (500 mcg total) by mouth daily. Reported on 06/16/2015, Starting 10/02/2015, Until Discontinued, Normal    vitamin E 400 UNIT  capsule Take 1 capsule (400 Units total) by mouth daily. Reported on 06/16/2015, Starting 10/02/2015, Until Discontinued, Normal    potassium chloride SA (K-DUR,KLOR-CON) 20 MEQ tablet Take 2 tablets (40 mEq total) by mouth 2 (two) times daily., Starting Wed 11/22/2015, Normal      STOP taking these medications     HYDROcodone-acetaminophen (NORCO/VICODIN) 5-325 MG tablet      loratadine (CLARITIN) 10 MG tablet      Thiamine HCl (VITAMIN B-1) 250 MG tablet         Today   VITAL SIGNS:  Blood pressure 127/75, pulse 82, temperature 98.2 F (36.8 C), temperature source Oral, resp. rate 17, height 5\' 5"  (1.651 m), weight 122.7 kg (270 lb 6.4 oz), SpO2 98 %.  I/O:  No intake or output data in the 24 hours ending 12/10/15 1202  PHYSICAL EXAMINATION:  Physical Exam  GENERAL:  50 y.o.-year-old patient lying in the bed with no acute distress.  LUNGS: Normal breath sounds bilaterally, no wheezing, rales,rhonchi or crepitation. No use of accessory muscles of respiration.  CARDIOVASCULAR:  S1, S2 normal. No murmurs, rubs, or gallops.  ABDOMEN: Soft, non-tender, non-distended. Bowel sounds present. No organomegaly or mass. PEG tube in place NEUROLOGIC: Moves all 4 extremities. PSYCHIATRIC: The patient is alert and oriented x 3.  SKIN: No obvious rash, lesion, or ulcer.   DATA REVIEW:   CBC No results for input(s): WBC, HGB, HCT, PLT in the last 168 hours.  Chemistries   Recent Labs Lab 12/08/15 1159  NA 140  K 3.0*  CL 103  CO2 28  GLUCOSE 96  BUN 15  CREATININE 0.80  CALCIUM 9.7    Cardiac Enzymes No results for input(s): TROPONINI in the last 168 hours.  Microbiology Results  Results for orders placed or performed during the hospital encounter of 02/12/15  MRSA PCR Screening     Status: None   Collection Time: 02/13/15  9:50 AM  Result Value Ref Range Status   MRSA by PCR NEGATIVE NEGATIVE Final    Comment:        The GeneXpert MRSA Assay (FDA approved for NASAL  specimens only), is one component of a comprehensive MRSA colonization surveillance program. It is not intended to diagnose MRSA infection nor to guide or monitor treatment for MRSA infections.     RADIOLOGY:  No results found.  Follow up with PCP in 1 week.  Management plans discussed with the patient, family and they are in agreement.  CODE STATUS:  Code Status History    Date Active Date Inactive Code Status Order ID Comments User Context   12/01/2015  8:22 PM 12/01/2015  8:31 PM Full Code [id number]  Gladstone Lighter, MD Inpatient   02/13/2015  2:04 AM 02/14/2015  6:45 PM Full Code [id number]  Lance Coon, MD ED   08/30/2013  1:48 PM 08/30/2013 10:49 PM Full Code [id number]  Troy Sine, MD Inpatient   08/25/2013  4:42 AM 08/30/2013  1:48 PM Full Code [id number]  Rise Patience, MD Inpatient   05/09/2011  6:00 AM 05/10/2011  3:09 PM Full Code [id number]  Callie Fielding, RN Inpatient      TOTAL TIME TAKING CARE OF THIS PATIENT ON DAY OF DISCHARGE: more than 30 minutes.   Hillary Bow R M.D on 12/10/2015 at 12:02 PM  Between 7am to 6pm - Pager - [phone]  After 6pm go to [email or web address] - password [email or web address] [place] [facility]  Office  [phone]  CC: Primary care physician; Tommi Rumps, MD  Note: This dictation was prepared with Dragon dictation along with smaller phrase technology. Any transcriptional errors that result from this process are unintentional.

## 2015-12-14 ENCOUNTER — Other Ambulatory Visit: Payer: Self-pay

## 2015-12-14 ENCOUNTER — Ambulatory Visit: Payer: Self-pay | Admitting: Pain Medicine

## 2015-12-15 ENCOUNTER — Telehealth: Payer: Self-pay | Admitting: Family Medicine

## 2015-12-15 ENCOUNTER — Other Ambulatory Visit (INDEPENDENT_AMBULATORY_CARE_PROVIDER_SITE_OTHER): Payer: PPO

## 2015-12-15 DIAGNOSIS — K9189 Other postprocedural complications and disorders of digestive system: Secondary | ICD-10-CM | POA: Diagnosis not present

## 2015-12-15 DIAGNOSIS — E876 Hypokalemia: Secondary | ICD-10-CM | POA: Diagnosis not present

## 2015-12-15 DIAGNOSIS — Y832 Surgical operation with anastomosis, bypass or graft as the cause of abnormal reaction of the patient, or of later complication, without mention of misadventure at the time of the procedure: Secondary | ICD-10-CM | POA: Diagnosis not present

## 2015-12-15 LAB — POTASSIUM: POTASSIUM: 3.8 meq/L (ref 3.5–5.1)

## 2015-12-15 NOTE — Telephone Encounter (Signed)
Placed in red folder  

## 2015-12-15 NOTE — Telephone Encounter (Signed)
Pt dropped off disability parking placard form to be filled out.. Placed in Dr. Ellen Henri folder up front.. Please advise pt when complete

## 2015-12-16 NOTE — Telephone Encounter (Signed)
Form completed and placed on Jamie's desk.

## 2015-12-18 NOTE — Telephone Encounter (Signed)
LM that form will be placed up front for patient to pick up.

## 2015-12-21 ENCOUNTER — Other Ambulatory Visit: Payer: Self-pay | Admitting: Family Medicine

## 2015-12-21 NOTE — Telephone Encounter (Signed)
Please determine if the patient has seen psychiatry yet prior to giving refills. The seroquel should be managed through psychiatry if she is seeing them.

## 2015-12-21 NOTE — Telephone Encounter (Signed)
Can we refill these? 

## 2015-12-22 ENCOUNTER — Emergency Department
Admission: EM | Admit: 2015-12-22 | Discharge: 2015-12-23 | Disposition: A | Discharge status: Home / Self Care | Payer: PPO | Attending: Emergency Medicine | Admitting: Emergency Medicine

## 2015-12-22 ENCOUNTER — Encounter: Payer: Self-pay | Admitting: Urgent Care

## 2015-12-22 ENCOUNTER — Emergency Department: Payer: PPO

## 2015-12-22 DIAGNOSIS — Z7982 Long term (current) use of aspirin: Secondary | ICD-10-CM | POA: Diagnosis not present

## 2015-12-22 DIAGNOSIS — Z85528 Personal history of other malignant neoplasm of kidney: Secondary | ICD-10-CM | POA: Diagnosis not present

## 2015-12-22 DIAGNOSIS — Z79899 Other long term (current) drug therapy: Secondary | ICD-10-CM | POA: Insufficient documentation

## 2015-12-22 DIAGNOSIS — R079 Chest pain, unspecified: Secondary | ICD-10-CM | POA: Diagnosis not present

## 2015-12-22 DIAGNOSIS — E119 Type 2 diabetes mellitus without complications: Secondary | ICD-10-CM | POA: Diagnosis not present

## 2015-12-22 DIAGNOSIS — R0602 Shortness of breath: Secondary | ICD-10-CM | POA: Diagnosis not present

## 2015-12-22 DIAGNOSIS — I10 Essential (primary) hypertension: Secondary | ICD-10-CM | POA: Diagnosis not present

## 2015-12-22 DIAGNOSIS — E876 Hypokalemia: Secondary | ICD-10-CM | POA: Insufficient documentation

## 2015-12-22 DIAGNOSIS — Z87891 Personal history of nicotine dependence: Secondary | ICD-10-CM | POA: Diagnosis not present

## 2015-12-22 DIAGNOSIS — R0789 Other chest pain: Secondary | ICD-10-CM | POA: Diagnosis not present

## 2015-12-22 LAB — CBC
HEMATOCRIT: 42.3 % (ref 35.0–47.0)
HEMOGLOBIN: 14.5 g/dL (ref 12.0–16.0)
MCH: 29.9 pg (ref 26.0–34.0)
MCHC: 34.3 g/dL (ref 32.0–36.0)
MCV: 87.1 fL (ref 80.0–100.0)
Platelets: 253 10*3/uL (ref 150–440)
RBC: 4.86 MIL/uL (ref 3.80–5.20)
RDW: 15.2 % — ABNORMAL HIGH (ref 11.5–14.5)
WBC: 7.2 10*3/uL (ref 3.6–11.0)

## 2015-12-22 LAB — BASIC METABOLIC PANEL
ANION GAP: 11 (ref 5–15)
BUN: 16 mg/dL (ref 6–20)
CO2: 22 mmol/L (ref 22–32)
Calcium: 9.3 mg/dL (ref 8.9–10.3)
Chloride: 105 mmol/L (ref 101–111)
Creatinine, Ser: 0.91 mg/dL (ref 0.44–1.00)
GLUCOSE: 102 mg/dL — AB (ref 65–99)
POTASSIUM: 3 mmol/L — AB (ref 3.5–5.1)
Sodium: 138 mmol/L (ref 135–145)

## 2015-12-22 LAB — TROPONIN I: Troponin I: <0.03 ng/mL (ref <0.03)

## 2015-12-22 NOTE — ED Triage Notes (Signed)
Patient presents to the ED tonight with c/o acute onset of retrosternal chest pain with (+) SOB. Symptoms began approx 15 mints PTA while patient was driving home.

## 2015-12-22 NOTE — ED Notes (Signed)
Patient in xray at this time.

## 2015-12-23 LAB — FIBRIN DERIVATIVES D-DIMER (ARMC ONLY): FIBRIN DERIVATIVES D-DIMER (ARMC): 527 — AB (ref 0–499)

## 2015-12-23 MED ORDER — ONDANSETRON HCL 4 MG/2ML IJ SOLN: 4.0000 mg | Freq: Once | INTRAMUSCULAR | Status: DC

## 2015-12-23 MED ORDER — MORPHINE SULFATE (PF) 4 MG/ML IV SOLN: 4.0000 mg | Freq: Once | INTRAVENOUS | Status: DC

## 2015-12-23 MED ORDER — POTASSIUM CHLORIDE 20 MEQ PO PACK
40.0000 meq | PACK | Freq: Once | ORAL | Status: AC
  Administered 2015-12-23: 40 meq via ORAL
  Filled 2015-12-23: qty 2

## 2015-12-23 NOTE — ED Notes (Signed)
1 IV unsuccessful. Asked Nellie to do ultrasound IV attempts.

## 2015-12-23 NOTE — ED Notes (Signed)
Gaspar Bidding unsuccessfully attempted 2x using ultrasound for IV access

## 2015-12-23 NOTE — ED Notes (Signed)
Nellie attempted 2x with ultrasound IV

## 2015-12-23 NOTE — ED Provider Notes (Signed)
[facility] Emergency Department Provider Note    First MD Initiated Contact with Patient 12/23/15 0001     (approximate)  I have reviewed the triage vital signs and the nursing notes.   HISTORY  Chief Complaint Chest Pain and Shortness of Breath    HPI Denise Davis is a 50 y.o. female with history diabetes DVT presents emergency Department with current 6 out of 10 central chest pain with onset approximately 15 minutes before presentation to the ED. Patient states the pain was associated with shortness of breath as well. Patient denies any diaphoresis no dizziness no nausea or vomiting. Patient describes pain as being sharp/pressure. Patient denies any lower external pain or swelling. Of note patient was seen emergency department for similar complaint on 12/01/2015 with a negative nuclear medicine stress test performed following that encounter.   Past Medical History:  Diagnosis Date  . Allergic rhinitis   . Anxiety   . Depression   . Diabetes mellitus   . DVT of leg (deep venous thrombosis) ([name]) 12/2007   left leg, given warfarin for 6 mos  . GERD (gastroesophageal reflux disease)   . H/O hiatal hernia   . Hyperlipidemia   . Hypertension   . Morbid obesity ([name])   . Pericarditis    Diagnosed/treated at [facility]; per patient 4 episodes per cardiologist Dr. Edwin Dada persistant chest pain unclear if pericarditis verusus neuropsychogenic   . Renal cell cancer ([name]) 2009   S/p nephrectomy (left)  . Seizure disorder ([name])   . Seizures ([name])   . Shortness of breath   . Syncope     Patient Active Problem List   Diagnosis Date Noted  . Hypokalemia 12/08/2015  . Sleep apnea 12/01/2015  . Palpitations 12/01/2015  . Sore throat 11/02/2015  . Status post gastric bypass for obesity 10/03/2015  . Osteoarthritis 10/03/2015  . Anxiety and depression 02/13/2015  . Seizure disorder ([name]) 02/13/2015  . GERD (gastroesophageal reflux disease) 02/13/2015  .  Chronic respiratory failure ([name]) 08/28/2013  . Syncope 08/25/2013  . Right sided weakness 08/25/2013  . Vocal cord dysfunction 08/18/2012  . Wheezing 08/16/2012  . Shortness of breath 08/16/2012  . Acute respiratory distress (HCC) 08/16/2012  . Chest pain 05/09/2011  . Diabetes mellitus ([name])   . Hypertension     Past Surgical History:  Procedure Laterality Date  . ENDOMETRIAL ABLATION  2007  . INSERTION OF MESH  2014  . LEFT HEART CATHETERIZATION WITH CORONARY ANGIOGRAM N/A 08/30/2013   Procedure: LEFT HEART CATHETERIZATION WITH CORONARY ANGIOGRAM;  Surgeon: Troy Sine, MD;  Location: [facility] CATH LAB;  Service: Cardiovascular;  Laterality: N/A;  . NEPHRECTOMY    . TUBAL LIGATION    . UMBILICAL HERNIA REPAIR      Prior to Admission medications   Medication Sig Start Date End Date Taking? Authorizing Provider  amLODipine (NORVASC) 10 MG tablet Take 1 tablet (10 mg total) by mouth daily. 10/02/15   Leone Haven, MD  aspirin EC 81 MG tablet Take 1 tablet (81 mg total) by mouth daily. 10/02/15   Leone Haven, MD  calcium-vitamin D 250-100 MG-UNIT tablet Take 1 tablet by mouth 2 (two) times daily. Reported on 06/16/2015 10/02/15   Leone Haven, MD  carvedilol (COREG) 25 MG tablet Take 1 tablet (25 mg total) by mouth 2 (two) times daily with a meal. 10/02/15   Leone Haven, MD  Cholecalciferol (VITAMIN D-3) 1000 units CAPS Take 1 capsule (1,000 Units total)  by mouth daily. Reported on 06/16/2015 10/02/15   Leone Haven, MD  gabapentin (NEURONTIN) 300 MG capsule Take 8 capsules (2,400 mg total) by mouth at bedtime. 10/02/15   Leone Haven, MD  hydrochlorothiazide (HYDRODIURIL) 25 MG tablet Take 1 tablet (25 mg total) by mouth daily. 10/02/15   Leone Haven, MD  mirtazapine (REMERON) 15 MG tablet Take 1 tablet (15 mg total) by mouth at bedtime. 10/02/15   Leone Haven, MD  Multiple Vitamins-Minerals (MULTIVITAMIN WITH MINERALS) tablet Take 1 tablet by mouth daily.  Reported on 06/16/2015    Historical Provider, MD  nitroGLYCERIN (NITROSTAT) 0.4 MG SL tablet Place 1 tablet (0.4 mg total) under the tongue every 5 (five) minutes as needed for chest pain. (may repeat every 5 minutes but seek medical help if pain persists after 3 tablets) 10/02/15   Leone Haven, MD  omeprazole (PRILOSEC) 40 MG capsule Take 1 capsule (40 mg total) by mouth 2 (two) times daily before a meal. 12/02/15   Srikar Sudini, MD  potassium chloride 20 MEQ/15ML (10%) SOLN Place 30 mLs (40 mEq total) into feeding tube daily. 12/08/15   Leone Haven, MD  promethazine (PHENERGAN) 25 MG tablet Take 1 tablet (25 mg total) by mouth every 8 (eight) hours as needed for nausea or vomiting. Reported on 08/07/2015 10/02/15   Leone Haven, MD  QUEtiapine (SEROQUEL) 100 MG tablet Take 100 mg by mouth at bedtime.    Historical Provider, MD  sertraline (ZOLOFT) 100 MG tablet Take 50 mg (1/2 tablet) by mouth daily for 1 week, then take 100 mg (1 tablet) by mouth daily for 1 week, then take 150 mg (1.5 tablets) by mouth daily for 1 week, then take 200 mg (2 tablets) by mouth daily Patient taking differently: Take 200 mg by mouth at bedtime.  10/02/15   Leone Haven, MD  ursodiol (ACTIGALL) 300 MG capsule Take 1 capsule (300 mg total) by mouth 2 (two) times daily. 10/02/15   Leone Haven, MD  vitamin B-12 (CYANOCOBALAMIN) 500 MCG tablet Take 1 tablet (500 mcg total) by mouth daily. Reported on 06/16/2015 10/02/15   Leone Haven, MD  vitamin E 400 UNIT capsule Take 1 capsule (400 Units total) by mouth daily. Reported on 06/16/2015 10/02/15   Leone Haven, MD    Allergies Lisinopril and Milk-related compounds  Family History  Problem Relation Age of Onset  . Hypertension Mother   . Breast cancer Mother   . Arthritis Mother   . Heart disease Paternal Grandmother   . Alcoholism Father   . Hyperlipidemia      Parent  . Mental illness      Parent  . Diabetes      Parent    Social  History Social History  Substance Use Topics  . Smoking status: Former Research scientist (life sciences)  . Smokeless tobacco: Never Used  . Alcohol use No    Review of Systems Constitutional: No fever/chills Eyes: No visual changes. [name]: No sore throat. Cardiovascular: Positive for chest pain. Respiratory: Positive for shortness of breath. Gastrointestinal: No abdominal pain.  No nausea, no vomiting.  No diarrhea.  No constipation. Genitourinary: Negative for dysuria. Musculoskeletal: Negative for back pain. Skin: Negative for rash. Neurological: Negative for headaches, focal weakness or numbness.  10-point ROS otherwise negative.  ____________________________________________   PHYSICAL EXAM:  VITAL SIGNS: ED Triage Vitals  Enc Vitals Group     BP 12/22/15 2212 117/61     Pulse Rate  12/22/15 2212 (!) 103     Resp 12/22/15 2212 16     Temp 12/22/15 2212 98.7 F (37.1 C)     Temp Source 12/22/15 2212 Oral     SpO2 12/22/15 2212 100 %     Weight 12/22/15 2217 275 lb (124.7 kg)     Height 12/22/15 2217 5\' 5"  (1.651 m)     Head Circumference --      Peak Flow --      Pain Score 12/22/15 2217 8     Pain Loc --      Pain Edu? --      Excl. in [place]? --     Constitutional: Alert and oriented. Well appearing and in no acute distress. Eyes: Conjunctivae are normal. PERRL. EOMI. Head: Atraumatic. Mouth/Throat: Mucous membranes are moist.  Oropharynx non-erythematous. Neck: No stridor.  No meningeal signs.   Cardiovascular: Normal rate, regular rhythm. Good peripheral circulation. Grossly normal heart sounds. Respiratory: Normal respiratory effort.  No retractions. Lungs CTAB. Gastrointestinal: Soft and nontender. No distention.   Musculoskeletal: No lower extremity tenderness nor edema. No gross deformities of extremities. Neurologic:  Normal speech and language. No gross focal neurologic deficits are appreciated.  Skin:  Skin is warm, dry and intact. No rash noted. Psychiatric: Mood and affect are  normal. Speech and behavior are normal.  ____________________________________________   LABS (all labs ordered are listed, but only abnormal results are displayed)  Labs Reviewed  BASIC METABOLIC PANEL - Abnormal; Notable for the following:       Result Value   Potassium 3.0 (*)    Glucose, Bld 102 (*)    All other components within normal limits  CBC - Abnormal; Notable for the following:    RDW 15.2 (*)    All other components within normal limits  TROPONIN I   ____________________________________________  EKG  ED ECG REPORT I, [place] N Geneive Sandstrom, the attending physician, personally viewed and interpreted this ECG.   Date: 12/23/2015  EKG Time: 10:18 PM  Rate: 109  Rhythm: Sinus tachycardia  Axis: Normal  Intervals: Normal  ST&T Change: None  ____________________________________________  RADIOLOGY I,  N Anara Cowman, personally viewed and evaluated these images (plain radiographs) as part of my medical decision making, as well as reviewing the written report by the radiologist.  Dg Chest 2 View  Result Date: 12/22/2015 CLINICAL DATA:  Acute onset retrosternal chest pain with shortness of breath. EXAM: CHEST  2 VIEW COMPARISON:  12/01/2015 FINDINGS: The heart size and mediastinal contours are within normal limits. Both lungs are clear. The visualized skeletal structures are unremarkable. IMPRESSION: No active cardiopulmonary disease. Electronically Signed   By: Lucienne Capers M.D.   On: 12/22/2015 23:09     Procedures    INITIAL IMPRESSION / ASSESSMENT AND PLAN / ED COURSE  Pertinent labs & imaging results that were available during my care of the patient were reviewed by me and considered in my medical decision making (see chart for details).  Patient's troponin EKG revealing no evidence of ischemia or infarction. Patient's d-dimer 527 as such CT scan PE protocol ordered. I was notified by the nursing staff and the patient was requesting to be discharged before  CT scan was performed and spoke with the patient at length regarding this decision and the necessity to follow-up with primary care provider if symptoms were to persist or worsen to return to the emergency department immediately   Clinical Course    ____________________________________________  FINAL CLINICAL IMPRESSION(S) / ED  DIAGNOSES  Final diagnoses:  Hypokalemia  Chest pain, unspecified chest pain type     MEDICATIONS GIVEN DURING THIS VISIT:  Medications  morphine 4 MG/ML injection 4 mg (not administered)  ondansetron (ZOFRAN) injection 4 mg (not administered)     NEW OUTPATIENT MEDICATIONS STARTED DURING THIS VISIT:  New Prescriptions   No medications on file    Modified Medications   No medications on file    Discontinued Medications   ONDANSETRON (ZOFRAN-ODT) 4 MG DISINTEGRATING TABLET    DISSOLVE ONE TABLET IN MOUTH EVERY 8 HOURS AS NEEDED FOR NAUSEA OR  VOMITING     Note:  This document was prepared using Dragon voice recognition software and may include unintentional dictation errors.    Gregor Hams, MD 12/23/15 [id number]

## 2015-12-23 NOTE — ED Notes (Signed)
Called lab to let them know about d-dimer add on

## 2015-12-25 NOTE — Telephone Encounter (Signed)
Patient stated that she sees psychiatry in October.

## 2015-12-25 NOTE — Telephone Encounter (Signed)
Noted.  Refill sent to pharmacy. 

## 2015-12-28 ENCOUNTER — Emergency Department
Admission: EM | Admit: 2015-12-28 | Discharge: 2015-12-28 | Disposition: A | Discharge status: Home / Self Care | Payer: PPO | Attending: Emergency Medicine | Admitting: Emergency Medicine

## 2015-12-28 ENCOUNTER — Encounter: Payer: Self-pay | Admitting: Emergency Medicine

## 2015-12-28 ENCOUNTER — Emergency Department: Payer: PPO

## 2015-12-28 DIAGNOSIS — Z7982 Long term (current) use of aspirin: Secondary | ICD-10-CM | POA: Insufficient documentation

## 2015-12-28 DIAGNOSIS — Z85528 Personal history of other malignant neoplasm of kidney: Secondary | ICD-10-CM | POA: Insufficient documentation

## 2015-12-28 DIAGNOSIS — Z87891 Personal history of nicotine dependence: Secondary | ICD-10-CM | POA: Insufficient documentation

## 2015-12-28 DIAGNOSIS — E119 Type 2 diabetes mellitus without complications: Secondary | ICD-10-CM | POA: Insufficient documentation

## 2015-12-28 DIAGNOSIS — I10 Essential (primary) hypertension: Secondary | ICD-10-CM | POA: Insufficient documentation

## 2015-12-28 DIAGNOSIS — R079 Chest pain, unspecified: Secondary | ICD-10-CM | POA: Diagnosis not present

## 2015-12-28 DIAGNOSIS — R0602 Shortness of breath: Secondary | ICD-10-CM | POA: Diagnosis not present

## 2015-12-28 DIAGNOSIS — Z79899 Other long term (current) drug therapy: Secondary | ICD-10-CM | POA: Diagnosis not present

## 2015-12-28 DIAGNOSIS — R0600 Dyspnea, unspecified: Secondary | ICD-10-CM | POA: Diagnosis not present

## 2015-12-28 DIAGNOSIS — R0789 Other chest pain: Secondary | ICD-10-CM | POA: Diagnosis not present

## 2015-12-28 LAB — CBC
HEMATOCRIT: 41.6 % (ref 35.0–47.0)
HEMOGLOBIN: 14.4 g/dL (ref 12.0–16.0)
MCH: 29.9 pg (ref 26.0–34.0)
MCHC: 34.7 g/dL (ref 32.0–36.0)
MCV: 86.3 fL (ref 80.0–100.0)
Platelets: 267 10*3/uL (ref 150–440)
RBC: 4.82 MIL/uL (ref 3.80–5.20)
RDW: 15.4 % — AB (ref 11.5–14.5)
WBC: 5.8 10*3/uL (ref 3.6–11.0)

## 2015-12-28 LAB — BASIC METABOLIC PANEL
ANION GAP: 10 (ref 5–15)
BUN: 18 mg/dL (ref 6–20)
CALCIUM: 9.7 mg/dL (ref 8.9–10.3)
CO2: 24 mmol/L (ref 22–32)
Chloride: 105 mmol/L (ref 101–111)
Creatinine, Ser: 0.83 mg/dL (ref 0.44–1.00)
GFR calc Af Amer: >60 mL/min (ref >60)
GFR calc non Af Amer: >60 mL/min (ref >60)
GLUCOSE: 99 mg/dL (ref 65–99)
POTASSIUM: 3.1 mmol/L — AB (ref 3.5–5.1)
Sodium: 139 mmol/L (ref 135–145)

## 2015-12-28 LAB — TROPONIN I: Troponin I: <0.03 ng/mL (ref <0.03)

## 2015-12-28 MED ORDER — POTASSIUM CHLORIDE CRYS ER 20 MEQ PO TBCR
EXTENDED_RELEASE_TABLET | ORAL | Status: AC
  Administered 2015-12-28: 40 meq via ORAL
  Filled 2015-12-28: qty 2

## 2015-12-28 MED ORDER — IOPAMIDOL (ISOVUE-370) INJECTION 76%
75.0000 mL | Freq: Once | INTRAVENOUS | Status: AC | PRN
  Administered 2015-12-28: 75 mL via INTRAVENOUS
  Filled 2015-12-28: qty 75

## 2015-12-28 MED ORDER — ONDANSETRON HCL 4 MG/2ML IJ SOLN
4.0000 mg | Freq: Once | INTRAMUSCULAR | Status: AC
  Administered 2015-12-28: 4 mg via INTRAVENOUS
  Filled 2015-12-28: qty 2

## 2015-12-28 MED ORDER — POTASSIUM CHLORIDE CRYS ER 20 MEQ PO TBCR
40.0000 meq | EXTENDED_RELEASE_TABLET | Freq: Once | ORAL | Status: AC
Start: 2015-12-28 — End: 2015-12-28
  Administered 2015-12-28: 40 meq via ORAL

## 2015-12-28 MED ORDER — SODIUM CHLORIDE 0.9 % IV BOLUS (SEPSIS)
1000.0000 mL | Freq: Once | INTRAVENOUS | Status: AC
  Administered 2015-12-28: 1000 mL via INTRAVENOUS

## 2015-12-28 NOTE — ED Notes (Signed)
Patient transported to CT 

## 2015-12-28 NOTE — ED Triage Notes (Signed)
Pt with chest pain and difficulty breathing , x1 day , was seen here last week with elevated blood work for a blood clot, was instructed to come back here after seeing her PCP in follow up

## 2015-12-28 NOTE — ED Notes (Signed)
MD at bedside. 

## 2015-12-28 NOTE — Discharge Instructions (Signed)
You were evaluated for chest discomfort, and recent elevated d-dimer, and a chest CT was performed and found no blood clots. As we discussed, at this point I'm suspicious that perhaps your symptoms are coming from the digestive system including may be esophageal spasm. Please follow up with your primary care doctor as well as gastroenterologist.  Return to the emergency room for any worsening condition including chest pain, nausea, black or bloody stool, bloody vomiting, dizziness or passing out, trouble breathing, fever, or any other symptoms concerning to you.

## 2015-12-28 NOTE — ED Provider Notes (Signed)
[facility] Emergency Department Provider Note ____________________________________________   I have reviewed the triage vital signs and the triage nursing note.  HISTORY  Chief Complaint Chest Pain   Historian Patient  HPI Denise Davis is a 50 y.o. female with a history of hiatal hernia, prior DVT for which she is no longer on blood thinners, morbid obesity status post gastric bypass earlier this year, presenting today for central chest discomfort which is described as burning. Occasional shortness of breath. No fever. No black or bloody stool. She does use a feeding tube, and take omeprazole.  She was seen a few days ago and found to have elevated d-dimer and recommended for CT scan to rule out PE, but she left before that was performed. She went to see her primary care physician and she was told to come to the ED for further completion and investigation for persistence of chest discomfort. No coughing or sputum production.  She is on concerned about the possibility for esophageal spasm or stricture.    Past Medical History:  Diagnosis Date  . Allergic rhinitis   . Anxiety   . Depression   . Diabetes mellitus   . DVT of leg (deep venous thrombosis) ([place]) 12/2007   left leg, given warfarin for 6 mos  . GERD (gastroesophageal reflux disease)   . H/O hiatal hernia   . Hyperlipidemia   . Hypertension   . Morbid obesity ([place])   . Pericarditis    Diagnosed/treated at [facility]; per patient 4 episodes per cardiologist Dr. Edwin Dada persistant chest pain unclear if pericarditis verusus neuropsychogenic   . Renal cell cancer ([place]) 2009   S/p nephrectomy (left)  . Seizure disorder ([place])   . Seizures ([place])   . Shortness of breath   . Syncope     Patient Active Problem List   Diagnosis Date Noted  . Hypokalemia 12/08/2015  . Sleep apnea 12/01/2015  . Palpitations 12/01/2015  . Sore throat 11/02/2015  . Status post gastric bypass for obesity 10/03/2015  .  Osteoarthritis 10/03/2015  . Anxiety and depression 02/13/2015  . Seizure disorder ([place]) 02/13/2015  . GERD (gastroesophageal reflux disease) 02/13/2015  . Chronic respiratory failure ([place]) 08/28/2013  . Syncope 08/25/2013  . Right sided weakness 08/25/2013  . Vocal cord dysfunction 08/18/2012  . Wheezing 08/16/2012  . Shortness of breath 08/16/2012  . Acute respiratory distress (HCC) 08/16/2012  . Chest pain 05/09/2011  . Diabetes mellitus ([place])   . Hypertension     Past Surgical History:  Procedure Laterality Date  . ENDOMETRIAL ABLATION  2007  . INSERTION OF MESH  2014  . LEFT HEART CATHETERIZATION WITH CORONARY ANGIOGRAM N/A 08/30/2013   Procedure: LEFT HEART CATHETERIZATION WITH CORONARY ANGIOGRAM;  Surgeon: Troy Sine, MD;  Location: [facility] CATH LAB;  Service: Cardiovascular;  Laterality: N/A;  . NEPHRECTOMY    . TUBAL LIGATION    . UMBILICAL HERNIA REPAIR      Prior to Admission medications   Medication Sig Start Date End Date Taking? Authorizing Provider  amLODipine (NORVASC) 10 MG tablet Take 1 tablet (10 mg total) by mouth daily. 10/02/15   Leone Haven, MD  aspirin EC 81 MG tablet Take 1 tablet (81 mg total) by mouth daily. 10/02/15   Leone Haven, MD  calcium-vitamin D 250-100 MG-UNIT tablet Take 1 tablet by mouth 2 (two) times daily. Reported on 06/16/2015 10/02/15   Leone Haven, MD  carvedilol (COREG) 25 MG tablet Take 1 tablet (25  mg total) by mouth 2 (two) times daily with a meal. 10/02/15   Leone Haven, MD  Cholecalciferol (VITAMIN D-3) 1000 units CAPS Take 1 capsule (1,000 Units total) by mouth daily. Reported on 06/16/2015 10/02/15   Leone Haven, MD  gabapentin (NEURONTIN) 300 MG capsule Take 8 capsules (2,400 mg total) by mouth at bedtime. 10/02/15   Leone Haven, MD  hydrochlorothiazide (HYDRODIURIL) 25 MG tablet Take 1 tablet (25 mg total) by mouth daily. 10/02/15   Leone Haven, MD  mirtazapine (REMERON) 15 MG tablet Take 1 tablet (15 mg  total) by mouth at bedtime. 10/02/15   Leone Haven, MD  Multiple Vitamins-Minerals (MULTIVITAMIN WITH MINERALS) tablet Take 1 tablet by mouth daily. Reported on 06/16/2015    Historical Provider, MD  nitroGLYCERIN (NITROSTAT) 0.4 MG SL tablet Place 1 tablet (0.4 mg total) under the tongue every 5 (five) minutes as needed for chest pain. (may repeat every 5 minutes but seek medical help if pain persists after 3 tablets) 10/02/15   Leone Haven, MD  omeprazole (PRILOSEC) 40 MG capsule Take 1 capsule (40 mg total) by mouth 2 (two) times daily before a meal. 12/02/15   Srikar Sudini, MD  ondansetron (ZOFRAN-ODT) 4 MG disintegrating tablet DISSOLVE ONE TABLET IN MOUTH EVERY 8 HOURS AS NEEDED FOR NAUSEA AND VOMITING 12/25/15   Leone Haven, MD  potassium chloride SA (K-DUR,KLOR-CON) 20 MEQ tablet Take 40 mEq by mouth 2 (two) times daily.    Historical Provider, MD  promethazine (PHENERGAN) 25 MG tablet Take 1 tablet (25 mg total) by mouth every 8 (eight) hours as needed for nausea or vomiting. Reported on 08/07/2015 10/02/15   Leone Haven, MD  QUEtiapine (SEROQUEL) 100 MG tablet Take 1 tablet (100 mg total) by mouth at bedtime. 12/25/15   Leone Haven, MD  sertraline (ZOLOFT) 100 MG tablet Take 50 mg (1/2 tablet) by mouth daily for 1 week, then take 100 mg (1 tablet) by mouth daily for 1 week, then take 150 mg (1.5 tablets) by mouth daily for 1 week, then take 200 mg (2 tablets) by mouth daily Patient taking differently: Take 200 mg by mouth at bedtime.  10/02/15   Leone Haven, MD  ursodiol (ACTIGALL) 300 MG capsule Take 1 capsule (300 mg total) by mouth 2 (two) times daily. 10/02/15   Leone Haven, MD  vitamin B-12 (CYANOCOBALAMIN) 500 MCG tablet Take 1 tablet (500 mcg total) by mouth daily. Reported on 06/16/2015 10/02/15   Leone Haven, MD  vitamin E 400 UNIT capsule Take 1 capsule (400 Units total) by mouth daily. Reported on 06/16/2015 10/02/15   Leone Haven, MD    Allergies   Allergen Reactions  . Lisinopril Cough  . Milk-Related Compounds Diarrhea and Nausea And Vomiting    "only milk causes problems. Milk products are ok"    Family History  Problem Relation Age of Onset  . Hypertension Mother   . Breast cancer Mother   . Arthritis Mother   . Heart disease Paternal Grandmother   . Alcoholism Father   . Hyperlipidemia      Parent  . Mental illness      Parent  . Diabetes      Parent    Social History Social History  Substance Use Topics  . Smoking status: Former Research scientist (life sciences)  . Smokeless tobacco: Never Used  . Alcohol use No    Review of Systems  Constitutional: Negative for fever. Eyes:  Negative for visual changes. [name]: Negative for sore throat. Cardiovascular: Positive for chest pain. Respiratory: Positive for occasional shortness of breath. Gastrointestinal: Negative for abdominal pain, vomiting and diarrhea. Genitourinary: Negative for dysuria. Musculoskeletal: Negative for back pain. Skin: Negative for rash. Neurological: Negative for headache. 10 point Review of Systems otherwise negative ____________________________________________   PHYSICAL EXAM:  VITAL SIGNS: ED Triage Vitals  Enc Vitals Group     BP 12/28/15 1146 111/74     Pulse Rate 12/28/15 1146 93     Resp 12/28/15 1146 (!) 22     Temp 12/28/15 1146 99.2 F (37.3 C)     Temp Source 12/28/15 1146 Oral     SpO2 12/28/15 1146 100 %     Weight 12/28/15 1147 268 lb (121.6 kg)     Height 12/28/15 1147 5\' 5"  (1.651 m)     Head Circumference --      Peak Flow --      Pain Score 12/28/15 1154 8     Pain Loc --      Pain Edu? --      Excl. in [place]? --      Constitutional: Alert and oriented. Well appearing and in no distress. HEENT   Head: Normocephalic and atraumatic.      Eyes: Conjunctivae are normal. PERRL. Normal extraocular movements.      Ears:         Nose: No congestion/rhinnorhea.   Mouth/Throat: Mucous membranes are moist.   Neck: No  stridor. Cardiovascular/Chest: Normal rate, regular rhythm.  No murmurs, rubs, or gallops. Respiratory: Normal respiratory effort without tachypnea nor retractions. Breath sounds are clear and equal bilaterally. No wheezes/rales/rhonchi. Gastrointestinal: Soft. No distention, no guarding, no rebound. Nontender.  Morbidly obese.  Genitourinary/rectal:Deferred Musculoskeletal: Nontender with normal range of motion in all extremities. No joint effusions.  No lower extremity tenderness.  No edema. Neurologic:  Normal speech and language. No gross or focal neurologic deficits are appreciated. Skin:  Skin is warm, dry and intact. No rash noted. Psychiatric: Mood and affect are normal. Speech and behavior are normal. Patient exhibits appropriate insight and judgment.   ____________________________________________  LABS (pertinent positives/negatives)  Labs Reviewed  BASIC METABOLIC PANEL - Abnormal; Notable for the following:       Result Value   Potassium 3.1 (*)    All other components within normal limits  CBC - Abnormal; Notable for the following:    RDW 15.4 (*)    All other components within normal limits  TROPONIN I    ____________________________________________    EKG I, Lisa Roca, MD, the attending physician have personally viewed and interpreted all ECGs.  88 bpm. Normal sinus rhythm. Narrow QRS. Normal axis. Nonspecific ST and T-wave ____________________________________________  RADIOLOGY All Xrays were viewed by me. Imaging interpreted by Radiologist.  CT chest with angiogram for PE:   IMPRESSION: 1. No evidence for acute abnormality. Technically adequate exam with no evidence for acute pulmonary embolus. 2. Coronary artery disease. 3. Postoperative changes in the upper abdomen. Question of feeding tube. 4. Left nephrectomy. __________________________________________  PROCEDURES  Procedure(s) performed: None  Critical Care performed:  None  ____________________________________________   ED COURSE / ASSESSMENT AND PLAN  Pertinent labs & imaging results that were available during my care of the patient were reviewed by me and considered in my medical decision making (see chart for details).   Ms. Morison is here for completion of her evaluation given chest discomfort and occasional shortness of breath with an elevated  d-dimer for a few days ago.  Her EKG and labs are reassuring. I did discuss with her obtaining CT scan to rule out PE. She does have one kidney, however I discussed risks versus benefit and we chose to proceed. She is given a liter fluid normal saline bolus after contrast load. She is instructed to stay well-hydrated for the next 48 hours.  CT scan negative for PE.  We discussed further and it sounds like may be GI is the source of her discomfort, including possibly esophageal stricture or spasm. She thinks it doesn't really feel like gastritis, but she does have a history of GERD. Any case, she will follow up with a primary care physician as well as GI.    CONSULTATIONS:   None   Patient / Family / Caregiver informed of clinical course, medical decision-making process, and agree with plan.   I discussed return precautions, follow-up instructions, and discharge instructions with patient and/or family.   ___________________________________________   FINAL CLINICAL IMPRESSION(S) / ED DIAGNOSES   Final diagnoses:  Nonspecific chest pain              Note: This dictation was prepared with Dragon dictation. Any transcriptional errors that result from this process are unintentional    Lisa Roca, MD 12/28/15 1723

## 2016-01-03 ENCOUNTER — Telehealth: Payer: Self-pay | Admitting: Family Medicine

## 2016-01-03 ENCOUNTER — Ambulatory Visit: Payer: Self-pay | Admitting: Family Medicine

## 2016-01-03 DIAGNOSIS — M170 Bilateral primary osteoarthritis of knee: Secondary | ICD-10-CM | POA: Diagnosis not present

## 2016-01-03 DIAGNOSIS — M25462 Effusion, left knee: Secondary | ICD-10-CM | POA: Diagnosis not present

## 2016-01-03 DIAGNOSIS — M25461 Effusion, right knee: Secondary | ICD-10-CM | POA: Diagnosis not present

## 2016-01-03 NOTE — Telephone Encounter (Signed)
Forms placed in red folder. Scheduled patient to come in on 01/09/16

## 2016-01-03 NOTE — Telephone Encounter (Signed)
Pt dropped off a medical necessity paper to be filled out by Dr. Caryl Bis. Will hand to [place].

## 2016-01-05 ENCOUNTER — Ambulatory Visit: Payer: Self-pay | Admitting: Neurology

## 2016-01-09 ENCOUNTER — Encounter: Payer: Self-pay | Admitting: Family Medicine

## 2016-01-09 ENCOUNTER — Ambulatory Visit (INDEPENDENT_AMBULATORY_CARE_PROVIDER_SITE_OTHER): Payer: PPO | Admitting: Family Medicine

## 2016-01-09 VITALS — BP 114/82 | HR 88 | Temp 98.6°F | Wt 270.8 lb

## 2016-01-09 DIAGNOSIS — Z01818 Encounter for other preprocedural examination: Secondary | ICD-10-CM

## 2016-01-09 DIAGNOSIS — R21 Rash and other nonspecific skin eruption: Secondary | ICD-10-CM | POA: Diagnosis not present

## 2016-01-09 DIAGNOSIS — E876 Hypokalemia: Secondary | ICD-10-CM | POA: Diagnosis not present

## 2016-01-09 LAB — BASIC METABOLIC PANEL
BUN: 12 mg/dL (ref 6–23)
CALCIUM: 9.3 mg/dL (ref 8.4–10.5)
CO2: 29 mEq/L (ref 19–32)
CREATININE: 0.84 mg/dL (ref 0.40–1.20)
Chloride: 108 mEq/L (ref 96–112)
GFR: 92.17 mL/min (ref >60.00)
Glucose, Bld: 113 mg/dL — ABNORMAL HIGH (ref 70–99)
Potassium: 3.3 mEq/L — ABNORMAL LOW (ref 3.5–5.1)
SODIUM: 144 meq/L (ref 135–145)

## 2016-01-09 MED ORDER — TRIAMCINOLONE ACETONIDE 0.1 % EX CREA: 1.0000 "application " | TOPICAL_CREAM | Freq: Two times a day (BID) | CUTANEOUS | 0 refills | Status: DC

## 2016-01-09 NOTE — Patient Instructions (Signed)
Nice to see you. We will fax the report to the surgeon's office. We we will start you on triamcinolone for the bumps on your right knee. If they do not improve at this please let us know.

## 2016-01-09 NOTE — Progress Notes (Signed)
Pre visit review using our clinic review tool, if applicable. No additional management support is needed unless otherwise documented below in the visit note. 

## 2016-01-09 NOTE — Assessment & Plan Note (Signed)
Potassium 3.1 on last check. We'll recheck today.

## 2016-01-09 NOTE — Assessment & Plan Note (Signed)
She presents for preoperative exam. She is low risk for cardiac complications with a Lyndel Safe cardiac score of 0.12. She additionally had a recent low risk stress test. Also had a reassuring echo. ASQIP risk noted to be above average risk for surgical site infection. Average risk for VTE and return to the operating room. Below average risk for everything else. Patient is awaiting a visit with neurology. I would suggest that this takes place and she has a discussion with her neurologist regarding seizure medications prior to proceeding with surgery. Once evaluation has occurred and she has received the okay from neurology from their perspective she can proceed with surgery. She does have a history of DVT. I would suggest postoperative anticoagulation through her surgeon's office. She is overall low risk for complications. Paperwork will be filled out and faxed to her surgeon.

## 2016-01-09 NOTE — Progress Notes (Signed)
Tommi Rumps, MD Phone: [phone]  Denise Davis is a 50 y.o. female who presents today for surgical clearance.  Patient is here for clearance for total knee replacement. Likely going to be the left one first per her report. She has had surgeries previously and tolerated them well. She notes no chest pain or breathlessness when climbing 2 flights of stairs at normal speed. She has a history of renal cell cancer and has one kidney though her kidney function has been in the normal range. No family history of anesthetic issues. No history of heart attack, irregular heartbeat, stroke, or personal history of anesthetic issues. She does have a history of seizures in the past. Currently not on medications. She has an appointment with neurology next week. She additionally has no pain, stiffness, or arthritis in her neck or jaw. No thyroid disease. No angina. No liver disease. No heart failure. No history of asthma. No history of diabetes. No history of bronchitis. Of note she had a recent low risk stress test for some musculoskeletal chest pain that she had been having. She had an echo at that time that revealed grade 1 diastolic dysfunction though no systolic dysfunction. She has no history of anemia. She does have a history of hypokalemia likely related to her difficulty taking in food since her gastric bypass surgery. Patient does have a history of DVT of the leg.  Patient additionally notes she has 2 bumps inferior to her right knee that have been there for 3 weeks. They'll improve for a short period time and then come back. They do itch. No spreading rash. She's tried an anti-itch cream on them. Also tried Aquaphor. Nothing helps.  PMH: Former smoker   ROS see history of present illness  Objective  Physical Exam Vitals:   01/09/16 1115  BP: 114/82  Pulse: 88  Temp: 98.6 F (37 C)    BP Readings from Last 3 Encounters:  01/09/16 114/82  12/28/15 111/61  12/23/15 126/85   Wt  Readings from Last 3 Encounters:  01/09/16 270 lb 12.8 oz (122.8 kg)  12/28/15 268 lb (121.6 kg)  12/22/15 275 lb (124.7 kg)    Physical Exam  Constitutional: No distress.  HENT:  Head: Normocephalic and atraumatic.  Mouth/Throat: Oropharynx is clear and moist. No oropharyngeal exudate.  Eyes: Conjunctivae are normal. Pupils are equal, round, and reactive to light.  Cardiovascular: Normal rate, regular rhythm and normal heart sounds.   Pulmonary/Chest: Effort normal and breath sounds normal.  Abdominal: Soft. Bowel sounds are normal. She exhibits no distension. There is no tenderness. There is no rebound and no guarding.  Musculoskeletal: She exhibits no edema.  Neurological: She is alert. Gait normal.  Skin: Skin is warm and dry. She is not diaphoretic.        Assessment/Plan: Please see individual problem list.  Rash and nonspecific skin eruption Rash appears to be bug bites. Discussed using topical triamcinolone to see if this will help improve this.  Preop examination She presents for preoperative exam. She is low risk for cardiac complications with a Lyndel Safe cardiac score of 0.12. She additionally had a recent low risk stress test. Also had a reassuring echo. ASQIP risk noted to be above average risk for surgical site infection. Average risk for VTE and return to the operating room. Below average risk for everything else. Patient is awaiting a visit with neurology. I would suggest that this takes place and she has a discussion with her neurologist regarding seizure medications prior  to proceeding with surgery. Once evaluation has occurred and she has received the okay from neurology from their perspective she can proceed with surgery. She does have a history of DVT. I would suggest postoperative anticoagulation through her surgeon's office. She is overall low risk for complications. Paperwork will be filled out and faxed to her surgeon.  Hypokalemia Potassium 3.1 on last check.  We'll recheck today.   Orders Placed This Encounter  Procedures  . Basic Metabolic Panel (BMET)    Meds ordered this encounter  Medications  . triamcinolone cream (KENALOG) 0.1 %    Sig: Apply 1 application topically 2 (two) times daily.    Dispense:  30 g    Refill:  0     Tommi Rumps, MD [place]

## 2016-01-09 NOTE — Assessment & Plan Note (Signed)
Rash appears to be bug bites. Discussed using topical triamcinolone to see if this will help improve this.

## 2016-02-02 ENCOUNTER — Ambulatory Visit: Payer: Self-pay | Admitting: Family Medicine

## 2016-02-12 ENCOUNTER — Encounter: Payer: Self-pay | Admitting: Family Medicine

## 2016-02-12 ENCOUNTER — Ambulatory Visit (INDEPENDENT_AMBULATORY_CARE_PROVIDER_SITE_OTHER): Payer: PPO | Admitting: Family Medicine

## 2016-02-12 ENCOUNTER — Telehealth: Payer: Self-pay | Admitting: Family Medicine

## 2016-02-12 VITALS — BP 138/78 | HR 72 | Temp 98.8°F | Resp 20 | Wt 258.5 lb

## 2016-02-12 DIAGNOSIS — E876 Hypokalemia: Secondary | ICD-10-CM

## 2016-02-12 DIAGNOSIS — J309 Allergic rhinitis, unspecified: Secondary | ICD-10-CM | POA: Insufficient documentation

## 2016-02-12 DIAGNOSIS — I10 Essential (primary) hypertension: Secondary | ICD-10-CM

## 2016-02-12 DIAGNOSIS — F418 Other specified anxiety disorders: Secondary | ICD-10-CM

## 2016-02-12 DIAGNOSIS — G629 Polyneuropathy, unspecified: Secondary | ICD-10-CM

## 2016-02-12 DIAGNOSIS — M170 Bilateral primary osteoarthritis of knee: Secondary | ICD-10-CM

## 2016-02-12 DIAGNOSIS — R0989 Other specified symptoms and signs involving the circulatory and respiratory systems: Secondary | ICD-10-CM | POA: Insufficient documentation

## 2016-02-12 DIAGNOSIS — F329 Major depressive disorder, single episode, unspecified: Secondary | ICD-10-CM

## 2016-02-12 DIAGNOSIS — F419 Anxiety disorder, unspecified: Secondary | ICD-10-CM

## 2016-02-12 LAB — BASIC METABOLIC PANEL
BUN: 14 mg/dL (ref 6–23)
CO2: 28 mEq/L (ref 19–32)
Calcium: 9.5 mg/dL (ref 8.4–10.5)
Chloride: 104 mEq/L (ref 96–112)
Creatinine, Ser: 0.74 mg/dL (ref 0.40–1.20)
GFR: 106.65 mL/min (ref >60.00)
GLUCOSE: 103 mg/dL — AB (ref 70–99)
POTASSIUM: 2.9 meq/L — AB (ref 3.5–5.1)
Sodium: 142 mEq/L (ref 135–145)

## 2016-02-12 MED ORDER — POTASSIUM CHLORIDE 20 MEQ/15ML (10%) PO SOLN: 40.0000 meq | Freq: Two times a day (BID) | ORAL | 0 refills | Status: DC

## 2016-02-12 MED ORDER — HYDROCODONE-ACETAMINOPHEN 5-325 MG PO TABS: 1.0000 | ORAL_TABLET | Freq: Four times a day (QID) | ORAL | 0 refills | Status: DC | PRN

## 2016-02-12 MED ORDER — SERTRALINE HCL 100 MG PO TABS: 200.0000 mg | ORAL_TABLET | Freq: Every day | ORAL | 1 refills | Status: DC

## 2016-02-12 MED ORDER — LORATADINE 10 MG PO TABS: 10.0000 mg | ORAL_TABLET | Freq: Every day | ORAL | 3 refills | Status: DC

## 2016-02-12 MED ORDER — HYDROCHLOROTHIAZIDE 25 MG PO TABS
12.5000 mg | ORAL_TABLET | Freq: Every day | ORAL | 1 refills | Status: DC
Start: 2016-02-12 — End: 2016-02-28

## 2016-02-12 MED ORDER — GABAPENTIN 300 MG PO CAPS: 2400.0000 mg | ORAL_CAPSULE | Freq: Every day | ORAL | 3 refills | Status: DC

## 2016-02-12 NOTE — Progress Notes (Signed)
Tommi Rumps, MD Phone: [phone]  Denise Davis is a 50 y.o. female who presents today for follow-up.  HYPERTENSION  Disease Monitoring  Home BP Monitoring 130/80, rarely goes up over 140/100 Chest pain- no    Dyspnea- no Medications  Compliance-  taking HCTZ, amlodipine, Coreg.  Edema- no  Anxiety/depression: Patient notes this is okay at this time. Some days it's worse than others. Currently taking Zoloft, Seroquel. She is seeing a psychiatrist and sees him next week and they will be taking over her medication prescriptions. She needs a refill of Zoloft until she sees them. No SI or HI.  Osteoarthritis: Bilateral knees. Saw orthopedics at [facility]. They're planning on doing a knee replacement. Notes her knees hurt mostly at night. Do ache when she walks. Possibly having surgery in December. Hydrocodone does help some.  Neuropathy: Bilateral feet. Notes some numbness and tingling in them. Rare burning. Gabapentin helps some. Only takes it at night. Notes no cuts or nonhealing sores on her feet.  Allergic rhinitis: Taking Claritin. Notes itchy nose, runny nose, itchy and watery eyes, and sneezing. Claritin is beneficial. No other upper respiratory symptoms.  PMH: Former smoker ROS see history of present illness  Objective  Physical Exam Vitals:   02/12/16 1401  BP: 138/78  Pulse: 72  Resp: 20  Temp: 98.8 F (37.1 C)    BP Readings from Last 3 Encounters:  02/12/16 138/78  01/09/16 114/82  12/28/15 111/61   Wt Readings from Last 3 Encounters:  02/12/16 258 lb 8 oz (117.3 kg)  01/09/16 270 lb 12.8 oz (122.8 kg)  12/28/15 268 lb (121.6 kg)    Physical Exam  Constitutional: No distress.  Cardiovascular: Normal rate, regular rhythm and normal heart sounds.   Pulmonary/Chest: Effort normal and breath sounds normal.  Musculoskeletal:  Bilateral knees with no swelling, joint line tenderness, warmth, or erythema, no ligament is laxity, discomfort on McMurray's on the  left though negative McMurray's, negative McMurray's on the right  Neurological: She is alert. Gait normal.  Skin: Skin is warm and dry. She is not diaphoretic.  Psychiatric: Mood and affect normal.   Diabetic Foot Exam - Simple   Simple Foot Form Diabetic Foot exam was performed with the following findings:  Yes 02/12/2016  2:25 PM  Visual Inspection No deformities, no ulcerations, no other skin breakdown bilaterally:  Yes Sensation Testing Intact to touch and monofilament testing bilaterally:  Yes Pulse Check See comments:  Yes Comments Decreased PT pulses bilaterally, DP pulses intact     Assessment/Plan: Please see individual problem list.  Anxiety and depression Stable. No SI or HI. Continue current medications until she follows up with psychiatry next week when they will take over her prescriptions.  Hypokalemia Recheck potassium.  Hypertension At goal. She is taking half of her prior dose of HCTZ. She will continue her current medications and we will check a BMP today.  Neuropathy ([place]) Likely related to diabetes. Stable at this time. She'll continue gabapentin.  Decreased pedal pulses Decreased PT pulses bilaterally. We will obtain ABIs.  Allergic rhinitis Symptoms consistent with allergic rhinitis. We will start on Claritin.  Osteoarthritis Continues to have issues with this. Hydrocodone somewhat helpful. She's planning on getting her knees replaced in the next several months. We will refill hydrocodone given her seizure history and gastric bypass precludes NSAIDs and tramadol.   Orders Placed This Encounter  Procedures  . Basic Metabolic Panel (BMET)    Meds ordered this encounter  Medications  .  sertraline (ZOLOFT) 100 MG tablet    Sig: Take 2 tablets (200 mg total) by mouth at bedtime.    Dispense:  180 tablet    Refill:  1  . hydrochlorothiazide (HYDRODIURIL) 25 MG tablet    Sig: Take 0.5 tablets (12.5 mg total) by mouth daily.    Dispense:  90  tablet    Refill:  1  . gabapentin (NEURONTIN) 300 MG capsule    Sig: Take 8 capsules (2,400 mg total) by mouth at bedtime.    Dispense:  240 capsule    Refill:  3  . HYDROcodone-acetaminophen (NORCO/VICODIN) 5-325 MG tablet    Sig: Take 1 tablet by mouth every 6 (six) hours as needed for moderate pain.    Dispense:  30 tablet    Refill:  0  . loratadine (CLARITIN) 10 MG tablet    Sig: Take 1 tablet (10 mg total) by mouth daily.    Dispense:  90 tablet    Refill:  [place], MD [place]

## 2016-02-12 NOTE — Assessment & Plan Note (Signed)
Stable. No SI or HI. Continue current medications until she follows up with psychiatry next week when they will take over her prescriptions.

## 2016-02-12 NOTE — Progress Notes (Signed)
Pre visit review using our clinic review tool, if applicable. No additional management support is needed unless otherwise documented below in the visit note. 

## 2016-02-12 NOTE — Assessment & Plan Note (Signed)
Likely related to diabetes. Stable at this time. She'll continue gabapentin.

## 2016-02-12 NOTE — Telephone Encounter (Signed)
Called and spoke with patient regarding potassium. We will send in potassium supplements. We'll have nursing get her scheduled for lab visit. We will contact her when we know the next step in evaluation of her hypokalemia.

## 2016-02-12 NOTE — Assessment & Plan Note (Signed)
At goal. She is taking half of her prior dose of HCTZ. She will continue her current medications and we will check a BMP today.

## 2016-02-12 NOTE — Assessment & Plan Note (Signed)
Symptoms consistent with allergic rhinitis. We will start on Claritin.

## 2016-02-12 NOTE — Assessment & Plan Note (Signed)
Recheck potassium 

## 2016-02-12 NOTE — Assessment & Plan Note (Signed)
Decreased PT pulses bilaterally. We will obtain ABIs.

## 2016-02-12 NOTE — Assessment & Plan Note (Signed)
Continues to have issues with this. Hydrocodone somewhat helpful. She's planning on getting her knees replaced in the next several months. We will refill hydrocodone given her seizure history and gastric bypass precludes NSAIDs and tramadol.

## 2016-02-12 NOTE — Patient Instructions (Addendum)
Nice to see you. We'll recheck some lab work today. I provided refills of your medications.

## 2016-02-13 ENCOUNTER — Other Ambulatory Visit: Payer: Self-pay

## 2016-02-13 ENCOUNTER — Other Ambulatory Visit: Payer: PPO

## 2016-02-13 NOTE — Telephone Encounter (Signed)
Can you please call patient to schedule lab appointment by Friday? Thank you

## 2016-02-13 NOTE — Telephone Encounter (Signed)
Pt coming in around 2:30 today. Need future orders placed.

## 2016-02-13 NOTE — Telephone Encounter (Signed)
Pt is scheduled for today at 11:30.

## 2016-02-13 NOTE — Telephone Encounter (Signed)
Order has been placed, though patient did not need to come in today. She needed to come in at the end of the week.

## 2016-02-14 ENCOUNTER — Telehealth: Payer: Self-pay | Admitting: *Deleted

## 2016-02-14 NOTE — Telephone Encounter (Signed)
Correct, pt was suppose to come in on Friday but the message was originally sent to [facility] to schedule the f/u lab for some reason & she was scheduled for yesterday. When pt came in, we read message & noticed that she was too early. So we rescheduled her for Friday.

## 2016-02-14 NOTE — Telephone Encounter (Signed)
Pt requested a medication refill for loratadine  Pharmacy [facility] on [address]

## 2016-02-14 NOTE — Telephone Encounter (Signed)
This was sent in on 02/12/16. Pt notified

## 2016-02-14 NOTE — Telephone Encounter (Signed)
Great, thanks for catching this.

## 2016-02-14 NOTE — Telephone Encounter (Signed)
Patient is coming in on Friday for labs

## 2016-02-16 ENCOUNTER — Other Ambulatory Visit (INDEPENDENT_AMBULATORY_CARE_PROVIDER_SITE_OTHER): Payer: PPO

## 2016-02-16 DIAGNOSIS — E876 Hypokalemia: Secondary | ICD-10-CM | POA: Diagnosis not present

## 2016-02-16 LAB — BASIC METABOLIC PANEL
BUN: 11 mg/dL (ref 6–23)
CALCIUM: 9.3 mg/dL (ref 8.4–10.5)
CO2: 31 meq/L (ref 19–32)
CREATININE: 0.78 mg/dL (ref 0.40–1.20)
Chloride: 101 mEq/L (ref 96–112)
GFR: 100.36 mL/min (ref >60.00)
GLUCOSE: 99 mg/dL (ref 70–99)
Potassium: 3.3 mEq/L — ABNORMAL LOW (ref 3.5–5.1)
Sodium: 139 mEq/L (ref 135–145)

## 2016-02-20 ENCOUNTER — Other Ambulatory Visit: Payer: Self-pay | Admitting: Family Medicine

## 2016-02-20 DIAGNOSIS — E876 Hypokalemia: Secondary | ICD-10-CM

## 2016-02-27 ENCOUNTER — Ambulatory Visit (INDEPENDENT_AMBULATORY_CARE_PROVIDER_SITE_OTHER): Payer: PPO

## 2016-02-27 ENCOUNTER — Other Ambulatory Visit (INDEPENDENT_AMBULATORY_CARE_PROVIDER_SITE_OTHER): Payer: PPO

## 2016-02-27 VITALS — BP 130/82 | HR 82

## 2016-02-27 DIAGNOSIS — E876 Hypokalemia: Secondary | ICD-10-CM

## 2016-02-27 DIAGNOSIS — I10 Essential (primary) hypertension: Secondary | ICD-10-CM | POA: Diagnosis not present

## 2016-02-27 LAB — BASIC METABOLIC PANEL
BUN: 13 mg/dL (ref 6–23)
CALCIUM: 9.4 mg/dL (ref 8.4–10.5)
CO2: 29 mEq/L (ref 19–32)
CREATININE: 0.82 mg/dL (ref 0.40–1.20)
Chloride: 103 mEq/L (ref 96–112)
GFR: 94.72 mL/min (ref >60.00)
Glucose, Bld: 115 mg/dL — ABNORMAL HIGH (ref 70–99)
Potassium: 3.8 mEq/L (ref 3.5–5.1)
Sodium: 140 mEq/L (ref 135–145)

## 2016-02-27 NOTE — Progress Notes (Signed)
Patient comes in for blood pressure check .  She has stopped her HCTZ due low potassium level.Please advise.

## 2016-02-27 NOTE — Progress Notes (Signed)
BP nearly at goal (new goal from ACC/AHA). Defer to PCP.

## 2016-02-28 ENCOUNTER — Telehealth: Payer: Self-pay

## 2016-02-28 NOTE — Progress Notes (Signed)
Patient advised and verbalized understanding.  She stated that when she checked BP this am BP was 144/94 with her wrist cuff.  She will continue to monitor and call us back and let us know.

## 2016-02-28 NOTE — Telephone Encounter (Signed)
When patient came in for nurse visit she was wondering what the status of the referral was for ABIs that was ordered on 02/12/16.  Please advise.

## 2016-02-28 NOTE — Telephone Encounter (Signed)
Will forward to Melissa to check on the status of this. The orders have been placed.

## 2016-02-28 NOTE — Addendum Note (Signed)
Addended by: Leone Haven on: 02/28/2016 09:20 AM   Modules accepted: Orders

## 2016-02-28 NOTE — Progress Notes (Signed)
Leftvoicemail to call  

## 2016-02-28 NOTE — Progress Notes (Signed)
BP relatively well controlled. She should monitor BP at home and record it then bring the recordings in to the office in 2 weeks. If persistently >130/90 she should let us know.  Tommi Rumps, MD

## 2016-02-29 DIAGNOSIS — E113393 Type 2 diabetes mellitus with moderate nonproliferative diabetic retinopathy without macular edema, bilateral: Secondary | ICD-10-CM | POA: Diagnosis not present

## 2016-02-29 NOTE — Progress Notes (Signed)
Noted. Please check back with patient to see what her blood pressure has been running the past 2 days. If greater than 130/90 we will need to add an additional medication.

## 2016-03-04 DIAGNOSIS — M259 Joint disorder, unspecified: Secondary | ICD-10-CM | POA: Diagnosis not present

## 2016-03-04 DIAGNOSIS — Z01818 Encounter for other preprocedural examination: Secondary | ICD-10-CM | POA: Diagnosis not present

## 2016-03-04 DIAGNOSIS — M1711 Unilateral primary osteoarthritis, right knee: Secondary | ICD-10-CM | POA: Diagnosis not present

## 2016-03-04 NOTE — Progress Notes (Signed)
Called and spoke with patient she states she only had cough when taking Lisinopril.

## 2016-03-04 NOTE — Progress Notes (Signed)
I would like to start the patient on another medication such as losartan. Can you confirm that her reaction to lisinopril is cough only and not lip, mouth, or tongue swelling? Once I know this I will send in a new medication. Thanks.

## 2016-03-04 NOTE — Progress Notes (Signed)
Spoke with patient states blood pressure yesterday was 140/100 . Today was 124/90.   Please advise.

## 2016-03-05 MED ORDER — LOSARTAN POTASSIUM 50 MG PO TABS: 50.0000 mg | ORAL_TABLET | Freq: Every day | ORAL | 3 refills | Status: DC

## 2016-03-05 NOTE — Addendum Note (Signed)
Addended by: Caryl Bis, Chistopher Mangino G on: 03/05/2016 01:16 PM   Modules accepted: Orders

## 2016-03-05 NOTE — Progress Notes (Signed)
Losartan sent to the pharmacy. She will need to return in 1 week after starting this medication for a lab check and blood pressure check. Orders have already been placed. Please contact the patient and get this set up. Thanks.

## 2016-03-06 NOTE — Progress Notes (Signed)
Patient advised script sent to pharmacy.  Appointment scheduled for lab and blood pressure check.

## 2016-03-12 ENCOUNTER — Ambulatory Visit (INDEPENDENT_AMBULATORY_CARE_PROVIDER_SITE_OTHER): Payer: PPO

## 2016-03-12 ENCOUNTER — Other Ambulatory Visit (INDEPENDENT_AMBULATORY_CARE_PROVIDER_SITE_OTHER): Payer: PPO

## 2016-03-12 VITALS — BP 130/92 | HR 79 | Resp 16

## 2016-03-12 DIAGNOSIS — I10 Essential (primary) hypertension: Secondary | ICD-10-CM | POA: Diagnosis not present

## 2016-03-12 LAB — BASIC METABOLIC PANEL
BUN: 14 mg/dL (ref 6–23)
CHLORIDE: 113 meq/L — AB (ref 96–112)
CO2: 17 meq/L — AB (ref 19–32)
CREATININE: 0.73 mg/dL (ref 0.40–1.20)
Calcium: 8.8 mg/dL (ref 8.4–10.5)
GFR: 108.3 mL/min (ref >60.00)
GLUCOSE: 91 mg/dL (ref 70–99)
Potassium: 3.6 mEq/L (ref 3.5–5.1)
Sodium: 143 mEq/L (ref 135–145)

## 2016-03-12 NOTE — Progress Notes (Signed)
Patient comes in for 1 week blood pressure check.   She states blood pressure has been averaging 128/93 130/97. Only checked one arm due to patient got bloodwork done in right arm.  Please advise.

## 2016-03-13 ENCOUNTER — Other Ambulatory Visit: Payer: Self-pay

## 2016-03-14 ENCOUNTER — Other Ambulatory Visit: Payer: Self-pay | Admitting: Family Medicine

## 2016-03-14 DIAGNOSIS — I10 Essential (primary) hypertension: Secondary | ICD-10-CM

## 2016-03-14 NOTE — Progress Notes (Signed)
Patient's blood pressure still above goal. I would like to increase the losartan if she is willing. Please check with her.  Tommi Rumps, M.D.

## 2016-03-15 ENCOUNTER — Telehealth: Payer: Self-pay | Admitting: *Deleted

## 2016-03-15 ENCOUNTER — Other Ambulatory Visit (INDEPENDENT_AMBULATORY_CARE_PROVIDER_SITE_OTHER): Payer: PPO

## 2016-03-15 DIAGNOSIS — I10 Essential (primary) hypertension: Secondary | ICD-10-CM | POA: Diagnosis not present

## 2016-03-15 LAB — BASIC METABOLIC PANEL
BUN: 16 mg/dL (ref 6–23)
CHLORIDE: 110 meq/L (ref 96–112)
CO2: 27 mEq/L (ref 19–32)
CREATININE: 0.86 mg/dL (ref 0.40–1.20)
Calcium: 8.9 mg/dL (ref 8.4–10.5)
GFR: 89.64 mL/min (ref >60.00)
Glucose, Bld: 93 mg/dL (ref 70–99)
Potassium: 3.5 mEq/L (ref 3.5–5.1)
Sodium: 143 mEq/L (ref 135–145)

## 2016-03-15 NOTE — Telephone Encounter (Signed)
Informed patient that the labs are not back and I do not see that anyone called her

## 2016-03-15 NOTE — Telephone Encounter (Signed)
Pt stated that she missed a call, pt did have las drawn today  Pt contact [phone]

## 2016-03-15 NOTE — Progress Notes (Signed)
Left message

## 2016-03-18 NOTE — Progress Notes (Signed)
Spoke with patient advised that you would like to increase losartan .  She advised me she has been checking BP at home 101/83, 130/90, 143/98.

## 2016-03-19 DIAGNOSIS — M109 Gout, unspecified: Secondary | ICD-10-CM | POA: Insufficient documentation

## 2016-03-21 DIAGNOSIS — F331 Major depressive disorder, recurrent, moderate: Secondary | ICD-10-CM | POA: Diagnosis not present

## 2016-03-21 DIAGNOSIS — Z79899 Other long term (current) drug therapy: Secondary | ICD-10-CM | POA: Diagnosis not present

## 2016-03-21 DIAGNOSIS — E119 Type 2 diabetes mellitus without complications: Secondary | ICD-10-CM | POA: Diagnosis not present

## 2016-03-21 DIAGNOSIS — J9610 Chronic respiratory failure, unspecified whether with hypoxia or hypercapnia: Secondary | ICD-10-CM | POA: Diagnosis not present

## 2016-03-21 DIAGNOSIS — Z6841 Body Mass Index (BMI) 40.0 and over, adult: Secondary | ICD-10-CM | POA: Diagnosis not present

## 2016-03-21 DIAGNOSIS — M1711 Unilateral primary osteoarthritis, right knee: Secondary | ICD-10-CM | POA: Diagnosis not present

## 2016-03-21 DIAGNOSIS — F4310 Post-traumatic stress disorder, unspecified: Secondary | ICD-10-CM | POA: Diagnosis not present

## 2016-03-21 DIAGNOSIS — M109 Gout, unspecified: Secondary | ICD-10-CM | POA: Diagnosis not present

## 2016-03-21 DIAGNOSIS — J45909 Unspecified asthma, uncomplicated: Secondary | ICD-10-CM | POA: Diagnosis not present

## 2016-03-21 DIAGNOSIS — Z471 Aftercare following joint replacement surgery: Secondary | ICD-10-CM | POA: Diagnosis not present

## 2016-03-21 DIAGNOSIS — I10 Essential (primary) hypertension: Secondary | ICD-10-CM | POA: Diagnosis not present

## 2016-03-21 DIAGNOSIS — K219 Gastro-esophageal reflux disease without esophagitis: Secondary | ICD-10-CM | POA: Diagnosis not present

## 2016-03-21 DIAGNOSIS — G4700 Insomnia, unspecified: Secondary | ICD-10-CM | POA: Diagnosis not present

## 2016-03-21 DIAGNOSIS — Z96651 Presence of right artificial knee joint: Secondary | ICD-10-CM | POA: Diagnosis not present

## 2016-03-21 DIAGNOSIS — Z7982 Long term (current) use of aspirin: Secondary | ICD-10-CM | POA: Diagnosis not present

## 2016-03-21 DIAGNOSIS — M7989 Other specified soft tissue disorders: Secondary | ICD-10-CM | POA: Diagnosis not present

## 2016-03-21 DIAGNOSIS — M1990 Unspecified osteoarthritis, unspecified site: Secondary | ICD-10-CM | POA: Diagnosis not present

## 2016-03-21 DIAGNOSIS — G40909 Epilepsy, unspecified, not intractable, without status epilepticus: Secondary | ICD-10-CM | POA: Diagnosis not present

## 2016-03-21 DIAGNOSIS — F1221 Cannabis dependence, in remission: Secondary | ICD-10-CM | POA: Diagnosis not present

## 2016-03-21 DIAGNOSIS — J9611 Chronic respiratory failure with hypoxia: Secondary | ICD-10-CM | POA: Diagnosis not present

## 2016-03-21 DIAGNOSIS — M21161 Varus deformity, not elsewhere classified, right knee: Secondary | ICD-10-CM | POA: Diagnosis not present

## 2016-03-21 DIAGNOSIS — D649 Anemia, unspecified: Secondary | ICD-10-CM | POA: Diagnosis not present

## 2016-03-21 DIAGNOSIS — E785 Hyperlipidemia, unspecified: Secondary | ICD-10-CM | POA: Diagnosis not present

## 2016-03-21 DIAGNOSIS — Z931 Gastrostomy status: Secondary | ICD-10-CM | POA: Diagnosis not present

## 2016-03-21 DIAGNOSIS — F411 Generalized anxiety disorder: Secondary | ICD-10-CM | POA: Diagnosis not present

## 2016-03-21 DIAGNOSIS — Z9884 Bariatric surgery status: Secondary | ICD-10-CM | POA: Diagnosis not present

## 2016-03-21 DIAGNOSIS — G4733 Obstructive sleep apnea (adult) (pediatric): Secondary | ICD-10-CM | POA: Diagnosis not present

## 2016-03-21 DIAGNOSIS — K429 Umbilical hernia without obstruction or gangrene: Secondary | ICD-10-CM | POA: Diagnosis not present

## 2016-03-21 DIAGNOSIS — Z79891 Long term (current) use of opiate analgesic: Secondary | ICD-10-CM | POA: Diagnosis not present

## 2016-03-22 DIAGNOSIS — J45909 Unspecified asthma, uncomplicated: Secondary | ICD-10-CM | POA: Diagnosis not present

## 2016-03-22 DIAGNOSIS — E119 Type 2 diabetes mellitus without complications: Secondary | ICD-10-CM | POA: Diagnosis not present

## 2016-03-22 DIAGNOSIS — D649 Anemia, unspecified: Secondary | ICD-10-CM | POA: Diagnosis not present

## 2016-03-22 DIAGNOSIS — I10 Essential (primary) hypertension: Secondary | ICD-10-CM | POA: Diagnosis not present

## 2016-03-22 NOTE — Progress Notes (Signed)
Noted. Still above goal. Was she going to increase her losartan dose? Thanks.

## 2016-03-22 NOTE — Progress Notes (Signed)
Yes if you will advise of dose increase.

## 2016-03-23 DIAGNOSIS — R42 Dizziness and giddiness: Secondary | ICD-10-CM | POA: Diagnosis not present

## 2016-03-23 DIAGNOSIS — D649 Anemia, unspecified: Secondary | ICD-10-CM | POA: Diagnosis not present

## 2016-03-23 DIAGNOSIS — E119 Type 2 diabetes mellitus without complications: Secondary | ICD-10-CM | POA: Diagnosis not present

## 2016-03-23 DIAGNOSIS — I10 Essential (primary) hypertension: Secondary | ICD-10-CM | POA: Diagnosis not present

## 2016-03-23 DIAGNOSIS — M2550 Pain in unspecified joint: Secondary | ICD-10-CM | POA: Diagnosis not present

## 2016-03-23 DIAGNOSIS — J45909 Unspecified asthma, uncomplicated: Secondary | ICD-10-CM | POA: Diagnosis not present

## 2016-03-26 DIAGNOSIS — G8929 Other chronic pain: Secondary | ICD-10-CM | POA: Diagnosis not present

## 2016-03-26 DIAGNOSIS — G40909 Epilepsy, unspecified, not intractable, without status epilepticus: Secondary | ICD-10-CM | POA: Diagnosis not present

## 2016-03-26 DIAGNOSIS — Z7951 Long term (current) use of inhaled steroids: Secondary | ICD-10-CM | POA: Diagnosis not present

## 2016-03-26 DIAGNOSIS — I10 Essential (primary) hypertension: Secondary | ICD-10-CM | POA: Diagnosis not present

## 2016-03-26 DIAGNOSIS — Z791 Long term (current) use of non-steroidal anti-inflammatories (NSAID): Secondary | ICD-10-CM | POA: Diagnosis not present

## 2016-03-26 DIAGNOSIS — Z471 Aftercare following joint replacement surgery: Secondary | ICD-10-CM | POA: Diagnosis not present

## 2016-03-26 DIAGNOSIS — Z96651 Presence of right artificial knee joint: Secondary | ICD-10-CM | POA: Diagnosis not present

## 2016-03-26 DIAGNOSIS — R296 Repeated falls: Secondary | ICD-10-CM | POA: Diagnosis not present

## 2016-03-26 DIAGNOSIS — J45909 Unspecified asthma, uncomplicated: Secondary | ICD-10-CM | POA: Diagnosis not present

## 2016-03-26 DIAGNOSIS — F329 Major depressive disorder, single episode, unspecified: Secondary | ICD-10-CM | POA: Diagnosis not present

## 2016-03-26 MED ORDER — LOSARTAN POTASSIUM 100 MG PO TABS: 100.0000 mg | ORAL_TABLET | Freq: Every day | ORAL | 3 refills | Status: DC

## 2016-03-26 NOTE — Addendum Note (Signed)
Addended by: Leone Haven on: 03/26/2016 08:36 PM   Modules accepted: Orders

## 2016-03-26 NOTE — Progress Notes (Signed)
Patient should increase losartan to 100 mg daily. She can take two 50 mg tablets until she runs out, then I can send in a new prescription. She will need to come in for repeat lab work in one week after increasing the medication. Thanks.

## 2016-03-27 NOTE — Progress Notes (Signed)
Patient advised of below . She will call back to schedule her lab appointment tomorrow .

## 2016-03-28 ENCOUNTER — Encounter: Payer: Self-pay | Admitting: Surgery

## 2016-03-29 DIAGNOSIS — I10 Essential (primary) hypertension: Secondary | ICD-10-CM | POA: Diagnosis not present

## 2016-03-29 DIAGNOSIS — G40909 Epilepsy, unspecified, not intractable, without status epilepticus: Secondary | ICD-10-CM | POA: Diagnosis not present

## 2016-03-29 DIAGNOSIS — Z471 Aftercare following joint replacement surgery: Secondary | ICD-10-CM | POA: Diagnosis not present

## 2016-03-29 DIAGNOSIS — Z791 Long term (current) use of non-steroidal anti-inflammatories (NSAID): Secondary | ICD-10-CM | POA: Diagnosis not present

## 2016-03-29 DIAGNOSIS — R296 Repeated falls: Secondary | ICD-10-CM | POA: Diagnosis not present

## 2016-03-29 DIAGNOSIS — J45909 Unspecified asthma, uncomplicated: Secondary | ICD-10-CM | POA: Diagnosis not present

## 2016-03-29 DIAGNOSIS — F329 Major depressive disorder, single episode, unspecified: Secondary | ICD-10-CM | POA: Diagnosis not present

## 2016-03-29 DIAGNOSIS — Z96651 Presence of right artificial knee joint: Secondary | ICD-10-CM | POA: Diagnosis not present

## 2016-03-29 DIAGNOSIS — G8929 Other chronic pain: Secondary | ICD-10-CM | POA: Diagnosis not present

## 2016-03-29 DIAGNOSIS — Z7951 Long term (current) use of inhaled steroids: Secondary | ICD-10-CM | POA: Diagnosis not present

## 2016-04-01 HISTORY — PX: REPLACEMENT TOTAL KNEE BILATERAL: SUR1225

## 2016-04-02 DIAGNOSIS — G8929 Other chronic pain: Secondary | ICD-10-CM | POA: Diagnosis not present

## 2016-04-02 DIAGNOSIS — G40909 Epilepsy, unspecified, not intractable, without status epilepticus: Secondary | ICD-10-CM | POA: Diagnosis not present

## 2016-04-02 DIAGNOSIS — Z7951 Long term (current) use of inhaled steroids: Secondary | ICD-10-CM | POA: Diagnosis not present

## 2016-04-02 DIAGNOSIS — Z471 Aftercare following joint replacement surgery: Secondary | ICD-10-CM | POA: Diagnosis not present

## 2016-04-02 DIAGNOSIS — J45909 Unspecified asthma, uncomplicated: Secondary | ICD-10-CM | POA: Diagnosis not present

## 2016-04-02 DIAGNOSIS — F329 Major depressive disorder, single episode, unspecified: Secondary | ICD-10-CM | POA: Diagnosis not present

## 2016-04-02 DIAGNOSIS — Z96651 Presence of right artificial knee joint: Secondary | ICD-10-CM | POA: Diagnosis not present

## 2016-04-02 DIAGNOSIS — Z791 Long term (current) use of non-steroidal anti-inflammatories (NSAID): Secondary | ICD-10-CM | POA: Diagnosis not present

## 2016-04-02 DIAGNOSIS — I10 Essential (primary) hypertension: Secondary | ICD-10-CM | POA: Diagnosis not present

## 2016-04-02 DIAGNOSIS — R296 Repeated falls: Secondary | ICD-10-CM | POA: Diagnosis not present

## 2016-04-03 DIAGNOSIS — I10 Essential (primary) hypertension: Secondary | ICD-10-CM | POA: Diagnosis not present

## 2016-04-03 DIAGNOSIS — Z7951 Long term (current) use of inhaled steroids: Secondary | ICD-10-CM | POA: Diagnosis not present

## 2016-04-03 DIAGNOSIS — Z471 Aftercare following joint replacement surgery: Secondary | ICD-10-CM | POA: Diagnosis not present

## 2016-04-03 DIAGNOSIS — Z791 Long term (current) use of non-steroidal anti-inflammatories (NSAID): Secondary | ICD-10-CM | POA: Diagnosis not present

## 2016-04-03 DIAGNOSIS — R296 Repeated falls: Secondary | ICD-10-CM | POA: Diagnosis not present

## 2016-04-03 DIAGNOSIS — G40909 Epilepsy, unspecified, not intractable, without status epilepticus: Secondary | ICD-10-CM | POA: Diagnosis not present

## 2016-04-03 DIAGNOSIS — F329 Major depressive disorder, single episode, unspecified: Secondary | ICD-10-CM | POA: Diagnosis not present

## 2016-04-03 DIAGNOSIS — J45909 Unspecified asthma, uncomplicated: Secondary | ICD-10-CM | POA: Diagnosis not present

## 2016-04-03 DIAGNOSIS — G8929 Other chronic pain: Secondary | ICD-10-CM | POA: Diagnosis not present

## 2016-04-03 DIAGNOSIS — Z96651 Presence of right artificial knee joint: Secondary | ICD-10-CM | POA: Diagnosis not present

## 2016-04-04 DIAGNOSIS — Z791 Long term (current) use of non-steroidal anti-inflammatories (NSAID): Secondary | ICD-10-CM | POA: Diagnosis not present

## 2016-04-04 DIAGNOSIS — I10 Essential (primary) hypertension: Secondary | ICD-10-CM | POA: Diagnosis not present

## 2016-04-04 DIAGNOSIS — J45909 Unspecified asthma, uncomplicated: Secondary | ICD-10-CM | POA: Diagnosis not present

## 2016-04-04 DIAGNOSIS — Z471 Aftercare following joint replacement surgery: Secondary | ICD-10-CM | POA: Diagnosis not present

## 2016-04-04 DIAGNOSIS — G8929 Other chronic pain: Secondary | ICD-10-CM | POA: Diagnosis not present

## 2016-04-04 DIAGNOSIS — F329 Major depressive disorder, single episode, unspecified: Secondary | ICD-10-CM | POA: Diagnosis not present

## 2016-04-04 DIAGNOSIS — Z96651 Presence of right artificial knee joint: Secondary | ICD-10-CM | POA: Diagnosis not present

## 2016-04-04 DIAGNOSIS — R296 Repeated falls: Secondary | ICD-10-CM | POA: Diagnosis not present

## 2016-04-04 DIAGNOSIS — Z7951 Long term (current) use of inhaled steroids: Secondary | ICD-10-CM | POA: Diagnosis not present

## 2016-04-04 DIAGNOSIS — G40909 Epilepsy, unspecified, not intractable, without status epilepticus: Secondary | ICD-10-CM | POA: Diagnosis not present

## 2016-04-05 ENCOUNTER — Other Ambulatory Visit: Payer: Self-pay | Admitting: Family Medicine

## 2016-04-05 NOTE — Telephone Encounter (Signed)
Last filled 10/02/15 30 2rf 

## 2016-04-08 ENCOUNTER — Telehealth: Payer: Self-pay | Admitting: Family Medicine

## 2016-04-08 DIAGNOSIS — G40909 Epilepsy, unspecified, not intractable, without status epilepticus: Secondary | ICD-10-CM | POA: Diagnosis not present

## 2016-04-08 DIAGNOSIS — I10 Essential (primary) hypertension: Secondary | ICD-10-CM | POA: Diagnosis not present

## 2016-04-08 DIAGNOSIS — J45909 Unspecified asthma, uncomplicated: Secondary | ICD-10-CM | POA: Diagnosis not present

## 2016-04-08 DIAGNOSIS — R296 Repeated falls: Secondary | ICD-10-CM | POA: Diagnosis not present

## 2016-04-08 DIAGNOSIS — Z96651 Presence of right artificial knee joint: Secondary | ICD-10-CM | POA: Diagnosis not present

## 2016-04-08 DIAGNOSIS — Z791 Long term (current) use of non-steroidal anti-inflammatories (NSAID): Secondary | ICD-10-CM | POA: Diagnosis not present

## 2016-04-08 DIAGNOSIS — F329 Major depressive disorder, single episode, unspecified: Secondary | ICD-10-CM | POA: Diagnosis not present

## 2016-04-08 DIAGNOSIS — G8929 Other chronic pain: Secondary | ICD-10-CM | POA: Diagnosis not present

## 2016-04-08 DIAGNOSIS — Z7951 Long term (current) use of inhaled steroids: Secondary | ICD-10-CM | POA: Diagnosis not present

## 2016-04-08 DIAGNOSIS — Z471 Aftercare following joint replacement surgery: Secondary | ICD-10-CM | POA: Diagnosis not present

## 2016-04-08 NOTE — Telephone Encounter (Signed)
Appointment already scheduled for BP check

## 2016-04-08 NOTE — Telephone Encounter (Signed)
Pt led to schedule an appt for a bp check. Please advise, thank you!  Call pt @ [phone]

## 2016-04-09 ENCOUNTER — Other Ambulatory Visit: Payer: Self-pay

## 2016-04-09 DIAGNOSIS — I10 Essential (primary) hypertension: Secondary | ICD-10-CM | POA: Diagnosis not present

## 2016-04-09 DIAGNOSIS — Z471 Aftercare following joint replacement surgery: Secondary | ICD-10-CM | POA: Diagnosis not present

## 2016-04-09 DIAGNOSIS — G8929 Other chronic pain: Secondary | ICD-10-CM | POA: Diagnosis not present

## 2016-04-09 DIAGNOSIS — Z791 Long term (current) use of non-steroidal anti-inflammatories (NSAID): Secondary | ICD-10-CM | POA: Diagnosis not present

## 2016-04-09 DIAGNOSIS — Z96651 Presence of right artificial knee joint: Secondary | ICD-10-CM | POA: Diagnosis not present

## 2016-04-09 DIAGNOSIS — R296 Repeated falls: Secondary | ICD-10-CM | POA: Diagnosis not present

## 2016-04-09 DIAGNOSIS — G40909 Epilepsy, unspecified, not intractable, without status epilepticus: Secondary | ICD-10-CM | POA: Diagnosis not present

## 2016-04-09 DIAGNOSIS — J45909 Unspecified asthma, uncomplicated: Secondary | ICD-10-CM | POA: Diagnosis not present

## 2016-04-09 DIAGNOSIS — F329 Major depressive disorder, single episode, unspecified: Secondary | ICD-10-CM | POA: Diagnosis not present

## 2016-04-09 DIAGNOSIS — Z7951 Long term (current) use of inhaled steroids: Secondary | ICD-10-CM | POA: Diagnosis not present

## 2016-04-11 DIAGNOSIS — F329 Major depressive disorder, single episode, unspecified: Secondary | ICD-10-CM | POA: Diagnosis not present

## 2016-04-11 DIAGNOSIS — M79669 Pain in unspecified lower leg: Secondary | ICD-10-CM | POA: Diagnosis not present

## 2016-04-11 DIAGNOSIS — R0789 Other chest pain: Secondary | ICD-10-CM | POA: Diagnosis not present

## 2016-04-11 DIAGNOSIS — Z96651 Presence of right artificial knee joint: Secondary | ICD-10-CM | POA: Diagnosis not present

## 2016-04-11 DIAGNOSIS — Z833 Family history of diabetes mellitus: Secondary | ICD-10-CM | POA: Diagnosis not present

## 2016-04-11 DIAGNOSIS — K219 Gastro-esophageal reflux disease without esophagitis: Secondary | ICD-10-CM | POA: Diagnosis not present

## 2016-04-11 DIAGNOSIS — Z86718 Personal history of other venous thrombosis and embolism: Secondary | ICD-10-CM | POA: Diagnosis not present

## 2016-04-11 DIAGNOSIS — G4733 Obstructive sleep apnea (adult) (pediatric): Secondary | ICD-10-CM | POA: Diagnosis not present

## 2016-04-11 DIAGNOSIS — G8929 Other chronic pain: Secondary | ICD-10-CM | POA: Diagnosis not present

## 2016-04-11 DIAGNOSIS — F4310 Post-traumatic stress disorder, unspecified: Secondary | ICD-10-CM | POA: Diagnosis not present

## 2016-04-11 DIAGNOSIS — R5383 Other fatigue: Secondary | ICD-10-CM | POA: Diagnosis not present

## 2016-04-11 DIAGNOSIS — R110 Nausea: Secondary | ICD-10-CM | POA: Diagnosis not present

## 2016-04-11 DIAGNOSIS — Z8249 Family history of ischemic heart disease and other diseases of the circulatory system: Secondary | ICD-10-CM | POA: Diagnosis not present

## 2016-04-11 DIAGNOSIS — Z905 Acquired absence of kidney: Secondary | ICD-10-CM | POA: Diagnosis not present

## 2016-04-11 DIAGNOSIS — Z79899 Other long term (current) drug therapy: Secondary | ICD-10-CM | POA: Diagnosis not present

## 2016-04-11 DIAGNOSIS — R202 Paresthesia of skin: Secondary | ICD-10-CM | POA: Diagnosis not present

## 2016-04-11 DIAGNOSIS — R0602 Shortness of breath: Secondary | ICD-10-CM | POA: Diagnosis not present

## 2016-04-11 DIAGNOSIS — I10 Essential (primary) hypertension: Secondary | ICD-10-CM | POA: Diagnosis not present

## 2016-04-11 DIAGNOSIS — R079 Chest pain, unspecified: Secondary | ICD-10-CM | POA: Diagnosis not present

## 2016-04-11 DIAGNOSIS — F410 Panic disorder [episodic paroxysmal anxiety] without agoraphobia: Secondary | ICD-10-CM | POA: Diagnosis not present

## 2016-04-11 DIAGNOSIS — R600 Localized edema: Secondary | ICD-10-CM | POA: Diagnosis not present

## 2016-04-11 DIAGNOSIS — Z7951 Long term (current) use of inhaled steroids: Secondary | ICD-10-CM | POA: Diagnosis not present

## 2016-04-11 DIAGNOSIS — Z9884 Bariatric surgery status: Secondary | ICD-10-CM | POA: Diagnosis not present

## 2016-04-11 DIAGNOSIS — R072 Precordial pain: Secondary | ICD-10-CM | POA: Diagnosis not present

## 2016-04-11 DIAGNOSIS — E785 Hyperlipidemia, unspecified: Secondary | ICD-10-CM | POA: Diagnosis not present

## 2016-04-11 DIAGNOSIS — J9811 Atelectasis: Secondary | ICD-10-CM | POA: Diagnosis not present

## 2016-04-11 DIAGNOSIS — J45909 Unspecified asthma, uncomplicated: Secondary | ICD-10-CM | POA: Diagnosis not present

## 2016-04-12 ENCOUNTER — Telehealth: Payer: Self-pay | Admitting: Family Medicine

## 2016-04-12 DIAGNOSIS — E785 Hyperlipidemia, unspecified: Secondary | ICD-10-CM | POA: Diagnosis not present

## 2016-04-12 DIAGNOSIS — R079 Chest pain, unspecified: Secondary | ICD-10-CM | POA: Diagnosis not present

## 2016-04-12 DIAGNOSIS — R791 Abnormal coagulation profile: Secondary | ICD-10-CM | POA: Diagnosis not present

## 2016-04-12 DIAGNOSIS — I10 Essential (primary) hypertension: Secondary | ICD-10-CM | POA: Diagnosis not present

## 2016-04-12 DIAGNOSIS — K224 Dyskinesia of esophagus: Secondary | ICD-10-CM

## 2016-04-12 DIAGNOSIS — E119 Type 2 diabetes mellitus without complications: Secondary | ICD-10-CM | POA: Diagnosis not present

## 2016-04-12 DIAGNOSIS — M7989 Other specified soft tissue disorders: Secondary | ICD-10-CM | POA: Diagnosis not present

## 2016-04-12 NOTE — Telephone Encounter (Signed)
[facility] called to scheduled pt a HFU for chest pains, needs in 7-10 days. Pt is being discharged today. Pt is scheduled for 04/23/16 @11 :30.

## 2016-04-16 NOTE — Telephone Encounter (Signed)
Transition Care Management Follow-up Telephone Call  How have you been since you were released from the hospital? Patient states chest still hurts and takes Nitro which helps , patient is taking Nitro 0.4 mg every half hour.   Do you understand why you were in the hospital?Yes, to rule out my heart causing chest pain.   Do you understand the discharge instrcutions? Yes.  Items Reviewed:  Medications reviewed:Yes, Changed omeprazole to BID and added Atorvastatin 10 mg for cholesterol.  Allergies reviewed: yes   Dietary changes reviewed: Yes , no changes.  Referrals reviewed:  Yes , just to refer to PCP.   Functional Questionnaire:   Activities of Daily Living (ADLs):   She states they are independent in the following:   Patient can dress, bathe , do light house work. States they require assistance with the following: Patient has to dress much slower due to activity.   Any transportation issues/concerns?: Yes, because husband is work ing and she cannot drive yet due to total right knee replacement done 03/21/16   Any patient concerns? Yes, pain is unbearable for right knee pain replacement and insurance will not pay for medication for pain.Patient pays for exercising knee, because she cannot take dose prescribed afraid she will run out of medication.   Confirmed importance and date/time of follow-up visits scheduled: Yes   Confirmed with patient if condition begins to worsen call PCP or go to the ER.  Patient was given the Call-a-Nurse line [phone]: Yes.

## 2016-04-16 NOTE — Telephone Encounter (Signed)
I have reviewed the patient's discharge summary. It appears that they ruled her out for cardiac cause with negative troponins. I would advise against taking nitroglycerin every 30 minutes as this could cause her blood pressure to drop. If she continues to require nitroglycerin that frequently I would suggest reevaluation and I am happy to see her in the office sooner than next week if she is able to find a ride in. We can also place referral to GI to get her to see them sooner as well.

## 2016-04-16 NOTE — Telephone Encounter (Signed)
Referral placed.

## 2016-04-16 NOTE — Telephone Encounter (Signed)
Left message for patient to return call to office, second attempt at TCM first attempt was 04/15/16.

## 2016-04-16 NOTE — Telephone Encounter (Signed)
Patient was admitted on 04/11/16 to [facility] for chest pain, patient was advised it may be an esophagus problem or GI issue,  Patient is taking Nitro 0.4 mg every half hour for chest pain, which has been relieved with Nitro, patient is also on Oxycodone 5 mg to 10 mg every fours hours for Total right knee replacement done on 03/21/16.  Medicating changes made during hospitalization for chest pain was omeprazole 40 mg from daily to BID and atorvastatin 10 mg was added.  Advised patient I would notify PCP of nitro, pain medication also advised patient that my concern was for her to be careful due to nitro that often could lower BP and cause a fall. Especially with that much pain medication.

## 2016-04-16 NOTE — Telephone Encounter (Signed)
Notified patient not to ake Nitro every 30 minutes and rescheduled patient for Friday for HFU, was able to arrange transportation, patient has tried to reach GI the referral for sooner appointment would be appreciated, patient is still waiting for return call from [place] office.

## 2016-04-17 ENCOUNTER — Ambulatory Visit: Payer: PPO | Admitting: Physical Therapy

## 2016-04-19 ENCOUNTER — Ambulatory Visit: Payer: Self-pay | Admitting: Family Medicine

## 2016-04-22 ENCOUNTER — Encounter: Payer: Self-pay | Admitting: Physical Therapy

## 2016-04-23 ENCOUNTER — Ambulatory Visit: Payer: Self-pay | Admitting: Family Medicine

## 2016-04-23 ENCOUNTER — Other Ambulatory Visit: Payer: Self-pay

## 2016-04-23 MED ORDER — NITROGLYCERIN 0.4 MG SL SUBL: 0.4000 mg | SUBLINGUAL_TABLET | SUBLINGUAL | 0 refills | Status: DC | PRN

## 2016-04-23 NOTE — Telephone Encounter (Signed)
Last filled 10/02/15 15 0rf, requesting 25

## 2016-04-24 ENCOUNTER — Ambulatory Visit: Payer: PPO | Attending: Orthopedic Surgery | Admitting: Physical Therapy

## 2016-04-24 ENCOUNTER — Encounter: Payer: Self-pay | Admitting: Physical Therapy

## 2016-04-24 DIAGNOSIS — M25561 Pain in right knee: Secondary | ICD-10-CM

## 2016-04-24 DIAGNOSIS — M6281 Muscle weakness (generalized): Secondary | ICD-10-CM | POA: Insufficient documentation

## 2016-04-24 DIAGNOSIS — R262 Difficulty in walking, not elsewhere classified: Secondary | ICD-10-CM | POA: Diagnosis not present

## 2016-04-24 NOTE — Telephone Encounter (Signed)
Faxed to [facility]

## 2016-04-25 ENCOUNTER — Encounter: Payer: Self-pay | Admitting: Physical Therapy

## 2016-04-25 NOTE — Therapy (Signed)
[facility] Jul 23, [address] [place], [address] Phone: [phone]   Fax:  [phone]  Physical Therapy Evaluation  Patient Details  Name: Denise Davis MRN: [id number] Date of Birth: 1965/10/10 Referring Provider: Domingo Pulse MD  Encounter Date: 04/24/2016      PT End of Session - 04/24/16 1000    Visit Number 1   Number of Visits 12   Date for PT Re-Evaluation 06/05/16   Authorization Type 1   Authorization Time Period 10 (G codes)   PT Start Time 0855   PT Stop Time 0945   PT Time Calculation (min) 50 min   Activity Tolerance Patient tolerated treatment well   Behavior During Therapy [facility] for tasks assessed/performed      Past Medical History:  Diagnosis Date  . Allergic rhinitis   . Anxiety   . Depression   . Diabetes mellitus   . DVT of leg (deep venous thrombosis) ([place]) 12/2007   left leg, given warfarin for 6 mos  . GERD (gastroesophageal reflux disease)   . H/O hiatal hernia   . Hyperlipidemia   . Hypertension   . Morbid obesity ([place])   . Pericarditis    Diagnosed/treated at [facility]; per patient 4 episodes per cardiologist Dr. Edwin Dada persistant chest pain unclear if pericarditis verusus neuropsychogenic   . Renal cell cancer ([place]) 07-24-07   S/p nephrectomy (left)  . Seizure disorder ([place])   . Seizures ([place])   . Shortness of breath   . Syncope     Past Surgical History:  Procedure Laterality Date  . ENDOMETRIAL ABLATION  July 23, 2005  . INSERTION OF MESH  07-23-2012  . LEFT HEART CATHETERIZATION WITH CORONARY ANGIOGRAM N/A 08/30/2013   Procedure: LEFT HEART CATHETERIZATION WITH CORONARY ANGIOGRAM;  Surgeon: Troy Sine, MD;  Location: [facility] CATH LAB;  Service: Cardiovascular;  Laterality: N/A;  . NEPHRECTOMY    . TUBAL LIGATION    . UMBILICAL HERNIA REPAIR      There were no vitals filed for this visit.       Subjective Assessment - 04/24/16 0918    Subjective Patient reports she is  having difficulty with sleeping and walking   Pertinent History Patient reports knee pain for 10 years and had to go on disability 2 years ago for multiple physical ailments including arthritis. She had undergone injections and gel treatment without results and then underwent TKA 03/21/2016.    Limitations House hold activities;Walking;Sitting;Standing   How long can you sit comfortably? 10 min.   How long can you stand comfortably? 10 min.   How long can you walk comfortably? 10 min.   Patient Stated Goals decreased pain improve motion and strength to be able to walk and prepare for surgery on left knee   Currently in Pain? Yes   Pain Score 4    Pain Location Knee   Pain Orientation Right   Pain Descriptors / Indicators Aching   Pain Type Acute pain;Surgical pain  03/21/2016   Pain Onset More than a month ago   Pain Frequency Intermittent            [facility] PT Assessment - 04/24/16 0905      Assessment   Medical Diagnosis R TKA V43.65   Referring Provider Domingo Pulse MD   Onset Date/Surgical Date 03/21/16   Hand Dominance Right   Next MD Visit unknown   Prior Therapy home health PT  Precautions   Precautions None     Restrictions   Weight Bearing Restrictions No     Balance Screen   Has the patient fallen in the past 6 months Yes   How many times? 3  knees gave out on her   Has the patient had a decrease in activity level because of a fear of falling?  No   Is the patient reluctant to leave their home because of a fear of falling?  No     Home Environment   Living Environment Private residence   Living Arrangements Spouse/significant other   Type of Center One level   [place] - 2 wheels;[place] - single point;Shower seat;Toilet riser;Wheelchair - Education officer, community - power     Prior Function   Level of Independence Independent   Vocation On disability  used to teach   Leisure shopping, movies,  reading      Cognition   Overall Cognitive Status Within Functional Limits for tasks assessed     Objective; Gait: ambulating with FWW, WBAT right LE, slow cautious movement AROM: right knee 0-120 degrees with pain posterior aspect of knee/thigh, calf Strength: right hip flexion 4-/5, abduciton 3+/5, ER 4-/5, knee extension 4-/5, flexion 3+/5 with pain  Observation: right knee mild to no swelling, well healed incision Patella mobility: good mobility all directions PF joint right knee  Treatment; Therapeutic exercise: patient performed exercises with verbal, tactile cues and demonstration of therapist: Sitting: Hip adduction with glute sets x 10 Hip abduction with resistive band x 15 reps Knee extension without resistance x 10 Seated SLR x 5 Ball roll outs with ball under foot x 15 reps Knee flexion with resistive band 5 reps (limited by pain)  Patient response to treatment: patient demonstrated improved technique with exercises with minimal VC for correct alignment. Improved motor control with repetition and cuing. Verbalized good understanding of home exercises.       PT Education - 04/24/16 0945    Education provided Yes   Education Details HEP: instructed in hip adduction with ball and glute sets, SLR sitting, roll ball under foot for ROM, hip abduciton with resistive band; reviewed goals and progression of exercises to be expected   Person(s) Educated Patient   Methods Explanation;Demonstration;Verbal cues;Handout   Comprehension Verbalized understanding;Returned demonstration;Verbal cues required;Need further instruction             PT Long Term Goals - 04/24/16 1100      PT LONG TERM GOAL #1   Title Patient with demonstrate improved function with right LE with household ambulation, daily tasks with less difficulty as indicated by LEFS score of 45/80 or better by 06/05/2016   Baseline LEFS 28/80   Status New     PT LONG TERM GOAL #2   Title Patient will be able to  ambulate with walker with minimal difficulty with 10MW 12 seconds or better by 06/05/2016 demonstrating improved community ambulation   Baseline deferred assessment    Status New     PT LONG TERM GOAL #3   Title Patient will be indepenent with home program for flexibility, strength and self management by 06/05/2016    Baseline requires assistance and instruction to perform exercises and progress appropriately   Status New               Plan - 04/24/16 0950    Clinical Impression Statement Patient is a 51 year old female s/p  right TKA with limitations of strength, pain and ability to walk without difficulty. She is limited in community ambulation, household chores and has limited knowledge of appropriate pain control stategies and progression of exercises in order to improve function and prepare for left TKA. Her current impairment level is 50% and is expected to achieve 20% or less by discharge from physical therapy.   Rehab Potential Good   Clinical Impairments Affecting Rehab Potential (+) acute condition right TKA, motivated (-)co morbidities, chronic pain, arthritis    PT Frequency 2x / week   PT Duration 6 weeks   PT Treatment/Interventions Electrical Stimulation;Cryotherapy;Moist Heat;Patient/family education;Neuromuscular re-education;Therapeutic exercise;Manual techniques   PT Next Visit Plan manual techniques, progressive exercise, pain control   PT Home Exercise Plan ROM, strengthening exercises   Consulted and Agree with Plan of Care Patient      Patient will benefit from skilled therapeutic intervention in order to improve the following deficits and impairments:  Decreased strength, Decreased balance, Pain, Impaired perceived functional ability, Decreased activity tolerance, Decreased endurance, Difficulty walking  Visit Diagnosis: Difficulty in walking, not elsewhere classified - Plan: PT plan of care cert/re-cert  Muscle weakness (generalized) - Plan: PT plan of care  cert/re-cert  Acute pain of right knee - Plan: PT plan of care cert/re-cert      G-Codes - [id number]    Functional Assessment Tool Used LEFS, pain, strength, ROM, clinical judgment   Functional Limitation Mobility: Walking and moving around   Mobility: Walking and Moving Around Current Status [id number]) At least 40 percent but less than 60 percent impaired, limited or restricted   Mobility: Walking and Moving Around Goal Status [phone]) At least 1 percent but less than 20 percent impaired, limited or restricted       Problem List Patient Active Problem List   Diagnosis Date Noted  . Neuropathy ([place]) 02/12/2016  . Decreased pedal pulses 02/12/2016  . Allergic rhinitis 02/12/2016  . Rash and nonspecific skin eruption 01/09/2016  . Preop examination 01/09/2016  . Hypokalemia 12/08/2015  . Sleep apnea 12/01/2015  . Palpitations 12/01/2015  . Sore throat 11/02/2015  . Status post gastric bypass for obesity 10/03/2015  . Osteoarthritis 10/03/2015  . Anxiety and depression 02/13/2015  . Seizure disorder ([place]) 02/13/2015  . GERD (gastroesophageal reflux disease) 02/13/2015  . Chronic respiratory failure ([place]) 08/28/2013  . Syncope 08/25/2013  . Right sided weakness 08/25/2013  . Vocal cord dysfunction 08/18/2012  . Wheezing 08/16/2012  . Shortness of breath 08/16/2012  . Acute respiratory distress 08/16/2012  . Chest pain 05/09/2011  . Diabetes mellitus ([place])   . Hypertension     Jomarie Longs PT 04/25/2016, 2:42 PM  [place] [address], [place], [address] Phone: [phone]   Fax:  [phone]  Name: BHAWANA TKACH MRN: [id number] Date of Birth: 10-Nov-1965

## 2016-04-29 ENCOUNTER — Ambulatory Visit: Payer: PPO | Admitting: Physical Therapy

## 2016-04-29 ENCOUNTER — Encounter: Payer: Self-pay | Admitting: Physical Therapy

## 2016-04-29 DIAGNOSIS — M6281 Muscle weakness (generalized): Secondary | ICD-10-CM

## 2016-04-29 DIAGNOSIS — M25561 Pain in right knee: Secondary | ICD-10-CM

## 2016-04-29 DIAGNOSIS — R262 Difficulty in walking, not elsewhere classified: Secondary | ICD-10-CM | POA: Diagnosis not present

## 2016-04-29 NOTE — Therapy (Signed)
[place] [facility] [address] [place], [address] Phone: [phone]   Fax:  [phone]  Physical Therapy Treatment  Patient Details  Name: Denise Davis MRN: [id number] Date of Birth: 07/05/1965 Referring Provider: Domingo Pulse MD  Encounter Date: 04/29/2016      PT End of Session - 04/29/16 1026    Visit Number 2   Number of Visits 12   Date for PT Re-Evaluation 06/05/16   Authorization Type 2   Authorization Time Period 10 (G codes)   PT Start Time 0950   PT Stop Time 1025   PT Time Calculation (min) 35 min   Activity Tolerance Patient tolerated treatment well;Patient limited by fatigue;Patient limited by pain  left knee weak and painful    Behavior During Therapy [facility] for tasks assessed/performed      Past Medical History:  Diagnosis Date  . Allergic rhinitis   . Anxiety   . Depression   . Diabetes mellitus   . DVT of leg (deep venous thrombosis) ([place]) 12/2007   left leg, given warfarin for 6 mos  . GERD (gastroesophageal reflux disease)   . H/O hiatal hernia   . Hyperlipidemia   . Hypertension   . Morbid obesity ([place])   . Pericarditis    Diagnosed/treated at [facility]; per patient 4 episodes per cardiologist Dr. Edwin Dada persistant chest pain unclear if pericarditis verusus neuropsychogenic   . Renal cell cancer ([place]) July 30, 2007   S/p nephrectomy (left)  . Seizure disorder ([place])   . Seizures ([place])   . Shortness of breath   . Syncope     Past Surgical History:  Procedure Laterality Date  . ENDOMETRIAL ABLATION  07-29-2005  . INSERTION OF MESH  07-29-2012  . LEFT HEART CATHETERIZATION WITH CORONARY ANGIOGRAM N/A 08/30/2013   Procedure: LEFT HEART CATHETERIZATION WITH CORONARY ANGIOGRAM;  Surgeon: Troy Sine, MD;  Location: [facility] CATH LAB;  Service: Cardiovascular;  Laterality: N/A;  . NEPHRECTOMY    . TUBAL LIGATION    . UMBILICAL HERNIA REPAIR      There were no vitals filed for this visit.       Subjective Assessment - 04/29/16 0956    Subjective Patient reports her sleeping is better and she is following suggestions with good results. She reports her left knee gave out over the weekend and she had to catch herself from falling: she did have her walker with her.    Limitations House hold activities;Walking;Sitting;Standing   Patient Stated Goals decreased pain improve motion and strength to be able to walk and prepare for surgery on left knee   Currently in Pain? Yes   Pain Score 7    Pain Location Knee   Pain Orientation Right   Pain Descriptors / Indicators Aching;Stabbing   Pain Type Acute pain  03/21/2017   Pain Onset More than a month ago   Pain Frequency Intermittent      Objective: AROM: 0-125 flexion right knee Gait; ambulating with FW rolling walker with good cadence and gait pattern   Treatment:  Manual therapy: STM performed to right LE quadriceps, hamstring and calf muscles, superficial techniques, with patient sitting: goal: pain, improve ROM Patella mobilization with patella mobilizer: distraction, caudal glides 3 sets Therapeutic exercise: patient performed exercises with verbal, tactile cues and demonstration of therapist: Sitting: Ball roll outs with 3# weight on ankles x 2 min. Each LE Hip adduction with ball and glute sets x 10 Hip abduction  with resistive band/manual resistance x 15 reps Knee extension with 3#  2 x 15 reps each  LE Knee flexion with red resistive band 2 x 15 reps with assistance of therapist  Knee flexion isometric multi angle x 3 sets, 3 angles of flexion  NuStep x 5 min. At end of session (unbilled time) for ROM/weight shifting  Patient response to treatment: Patient demonstrated improved technique with exercises with minimal VC for correct alignment. Patient with decreased pain from  7 /10 to  4/10. Patient with decreased spasms by 50% following STM. Improved motor control with repetition and cuing         PT Education -  04/29/16 0959    Education provided Yes   Education Details HEP: re assessed home exercises. added knee flexion with resistive band and 3# weights for knee extension   Person(s) Educated Patient   Methods Explanation;Demonstration;Verbal cues   Comprehension Verbalized understanding;Returned demonstration;Verbal cues required             PT Long Term Goals - 04/24/16 1100      PT LONG TERM GOAL #1   Title Patient with demonstrate improved function with right LE with household ambulation, daily tasks with less difficulty as indicated by LEFS score of 45/80 or better by 06/05/2016   Baseline LEFS 28/80   Status New     PT LONG TERM GOAL #2   Title Patient will be able to ambulate with walker with minimal difficulty with 10MW 12 seconds or better by 06/05/2016 demonstrating improved community ambulation   Baseline deferred assessment    Status New     PT LONG TERM GOAL #3   Title Patient will be indepenent with home program for flexibility, strength and self management by 06/05/2016    Baseline requires assistance and instruction to perform exercises and progress appropriately   Status New               Plan - 04/29/16 1027    Clinical Impression Statement Patient limited in exercises due to painful and weak left LE which is preparing for surgery June 2018. She was able to complete all exercises with minimal VC and improved pain and soft tissue elasticity in right LE quadriceps and calf muscles following STM.    Rehab Potential Good   PT Frequency 2x / week   PT Duration 6 weeks   PT Treatment/Interventions Electrical Stimulation;Cryotherapy;Moist Heat;Patient/family education;Neuromuscular re-education;Therapeutic exercise;Manual techniques   PT Next Visit Plan manual techniques, progressive exercise, pain control   PT Home Exercise Plan ROM, strengthening exercises      Patient will benefit from skilled therapeutic intervention in order to improve the following deficits  and impairments:  Decreased strength, Decreased balance, Pain, Impaired perceived functional ability, Decreased activity tolerance, Decreased endurance, Difficulty walking  Visit Diagnosis: Difficulty in walking, not elsewhere classified  Muscle weakness (generalized)  Acute pain of right knee     Problem List Patient Active Problem List   Diagnosis Date Noted  . Neuropathy ([place]) 02/12/2016  . Decreased pedal pulses 02/12/2016  . Allergic rhinitis 02/12/2016  . Rash and nonspecific skin eruption 01/09/2016  . Preop examination 01/09/2016  . Hypokalemia 12/08/2015  . Sleep apnea 12/01/2015  . Palpitations 12/01/2015  . Sore throat 11/02/2015  . Status post gastric bypass for obesity 10/03/2015  . Osteoarthritis 10/03/2015  . Anxiety and depression 02/13/2015  . Seizure disorder ([place]) 02/13/2015  . GERD (gastroesophageal reflux disease) 02/13/2015  . Chronic respiratory failure ([place]) 08/28/2013  .  Syncope 08/25/2013  . Right sided weakness 08/25/2013  . Vocal cord dysfunction 08/18/2012  . Wheezing 08/16/2012  . Shortness of breath 08/16/2012  . Acute respiratory distress 08/16/2012  . Chest pain 05/09/2011  . Diabetes mellitus ([place])   . Hypertension     Jomarie Longs PT 04/29/2016, 10:30 AM  [place] [facility] [address], [place], [address] Phone: [phone]   Fax:  [phone]  Name: Denise Davis MRN: [id number] Date of Birth: 1965/09/13

## 2016-05-01 ENCOUNTER — Ambulatory Visit (INDEPENDENT_AMBULATORY_CARE_PROVIDER_SITE_OTHER): Payer: PPO | Admitting: Gastroenterology

## 2016-05-01 ENCOUNTER — Encounter: Payer: Self-pay | Admitting: Gastroenterology

## 2016-05-01 ENCOUNTER — Other Ambulatory Visit: Payer: Self-pay

## 2016-05-01 VITALS — BP 117/68 | HR 83 | Temp 98.7°F | Ht 65.0 in | Wt 247.0 lb

## 2016-05-01 DIAGNOSIS — R079 Chest pain, unspecified: Secondary | ICD-10-CM

## 2016-05-01 DIAGNOSIS — R112 Nausea with vomiting, unspecified: Secondary | ICD-10-CM

## 2016-05-01 NOTE — Patient Instructions (Addendum)
You have been scheduled for an EGD at [facility] on 05/14/16. Please refer to the instruction sheet you were given today for important information.   You have been scheduled for an Esophageal Manometry at [facility] Endo unit on 05/21/16. You will need to contact the Endo unit at [phone] for the arrival time.

## 2016-05-01 NOTE — Progress Notes (Signed)
Gastroenterology Consultation  Referring Provider:     Leone Haven, MD Primary Care Physician:  Tommi Rumps, MD Primary Gastroenterologist:  Dr. Allen Norris     Reason for Consultation:     Chest pain        HPI:   Denise Davis is a 51 y.o. y/o female referred for consultation & management of Chest pain by Dr. Tommi Rumps, MD.  This patient comes in today after being seen for chest pain.  The patient states that she was tried on nitroglycerin and her symptoms got better.  She states that she was ruled out for any cardiac issues and was sent to see me for possible esophageal spasms.  The patient reports that she had gastric bypass surgery and presently has a PEG tube in place.  She states that she is not using the PEG tube and would like it out.  There is no report of any black stools or bloody stools but the patient does report that she is not eating because of nausea and feels flushed with abdominal pain every time she eats.  She reports that she is lost approximately 20 pounds in the last few weeks.  The patient has not seen her gastric bypass surgeon recently because she states he does not take her insurance anymore.  Past Medical History:  Diagnosis Date  . Allergic rhinitis   . Anxiety   . Depression   . Diabetes mellitus   . DVT of leg (deep venous thrombosis) ([place]) 12/2007   left leg, given warfarin for 6 mos  . GERD (gastroesophageal reflux disease)   . H/O hiatal hernia   . Hyperlipidemia   . Hypertension   . Morbid obesity ([place])   . Pericarditis    Diagnosed/treated at [facility]; per patient 4 episodes per cardiologist Dr. Edwin Dada persistant chest pain unclear if pericarditis verusus neuropsychogenic   . Renal cell cancer ([place]) 2009   S/p nephrectomy (left)  . Seizure disorder ([place])   . Seizures ([place])   . Shortness of breath   . Syncope     Past Surgical History:  Procedure Laterality Date  . ENDOMETRIAL ABLATION  2007  . INSERTION OF MESH  2014  . LEFT  HEART CATHETERIZATION WITH CORONARY ANGIOGRAM N/A 08/30/2013   Procedure: LEFT HEART CATHETERIZATION WITH CORONARY ANGIOGRAM;  Surgeon: Troy Sine, MD;  Location: [facility] CATH LAB;  Service: Cardiovascular;  Laterality: N/A;  . NEPHRECTOMY    . TUBAL LIGATION    . UMBILICAL HERNIA REPAIR      Prior to Admission medications   Medication Sig Start Date End Date Taking? Authorizing Provider  amLODipine (NORVASC) 10 MG tablet Take 1 tablet (10 mg total) by mouth daily. 10/02/15  Yes Leone Haven, MD  aspirin EC 81 MG tablet Take 1 tablet (81 mg total) by mouth daily. 10/02/15  Yes Leone Haven, MD  atorvastatin (LIPITOR) 10 MG tablet Take 10 mg by mouth. 04/12/16 05/12/16 Yes Historical Provider, MD  calcium-vitamin D 250-100 MG-UNIT tablet Take 1 tablet by mouth 2 (two) times daily. Reported on 06/16/2015 10/02/15  Yes Leone Haven, MD  carvedilol (COREG) 25 MG tablet Take 1 tablet (25 mg total) by mouth 2 (two) times daily with a meal. 10/02/15  Yes Leone Haven, MD  Cholecalciferol (VITAMIN D-3) 1000 units CAPS Take 1 capsule (1,000 Units total) by mouth daily. Reported on 06/16/2015 10/02/15  Yes Leone Haven, MD  colchicine 0.6 MG tablet One twice a  day 08/25/14  Yes Historical Provider, MD  enoxaparin (LOVENOX) 40 MG/0.4ML injection  03/24/16  Yes Historical Provider, MD  gabapentin (NEURONTIN) 300 MG capsule Take 8 capsules (2,400 mg total) by mouth at bedtime. 02/12/16  Yes Leone Haven, MD  HYDROcodone-acetaminophen (NORCO/VICODIN) 5-325 MG tablet Take 1 tablet by mouth every 6 (six) hours as needed for moderate pain. 02/12/16  Yes Leone Haven, MD  levETIRAcetam (KEPPRA) 750 MG tablet  02/08/16  Yes Historical Provider, MD  loratadine (CLARITIN) 10 MG tablet Take 1 tablet (10 mg total) by mouth daily. 02/12/16  Yes Leone Haven, MD  losartan (COZAAR) 100 MG tablet Take 1 tablet (100 mg total) by mouth daily. 03/26/16  Yes Leone Haven, MD  Melatonin 3 MG TABS  Take 3 mg by mouth. 09/20/15  Yes Historical Provider, MD  meloxicam (MOBIC) 7.5 MG tablet  03/24/16  Yes Historical Provider, MD  mirtazapine (REMERON) 15 MG tablet Take 1 tablet (15 mg total) by mouth at bedtime. 10/02/15  Yes Leone Haven, MD  Multiple Vitamins-Minerals (MULTIVITAMIN WITH MINERALS) tablet Take 1 tablet by mouth daily. Reported on 06/16/2015   Yes Historical Provider, MD  nitroGLYCERIN (NITROSTAT) 0.4 MG SL tablet Place 1 tablet (0.4 mg total) under the tongue every 5 (five) minutes as needed for chest pain. (may repeat every 5 minutes but seek medical help if pain persists after 3 tablets) 04/23/16  Yes Leone Haven, MD  omeprazole (PRILOSEC) 40 MG capsule Take 1 capsule (40 mg total) by mouth 2 (two) times daily before a meal. 12/02/15  Yes Srikar Sudini, MD  ondansetron (ZOFRAN-ODT) 4 MG disintegrating tablet DISSOLVE ONE TABLET IN MOUTH EVERY 8 HOURS AS NEEDED FOR NAUSEA AND VOMITING 12/25/15  Yes Leone Haven, MD  potassium chloride 20 MEQ/15ML (10%) SOLN Take 30 mLs (40 mEq total) by mouth 2 (two) times daily. 02/12/16  Yes Leone Haven, MD  promethazine (PHENERGAN) 25 MG tablet TAKE ONE TABLET BY MOUTH EVERY 8 HOURS AS NEEDED FOR NAUSEA OR  VOMITING 04/05/16  Yes Leone Haven, MD  QUEtiapine (SEROQUEL) 100 MG tablet  04/08/16  Yes Historical Provider, MD  sertraline (ZOLOFT) 100 MG tablet Take 2 tablets (200 mg total) by mouth at bedtime. 02/12/16  Yes Leone Haven, MD  triamcinolone cream (KENALOG) 0.1 % Apply 1 application topically 2 (two) times daily. 01/09/16  Yes Leone Haven, MD  ursodiol (ACTIGALL) 300 MG capsule Take 1 capsule (300 mg total) by mouth 2 (two) times daily. 10/02/15  Yes Leone Haven, MD  vitamin B-12 (CYANOCOBALAMIN) 500 MCG tablet Take by mouth. 10/02/15  Yes Historical Provider, MD    Family History  Problem Relation Age of Onset  . Hypertension Mother   . Breast cancer Mother   . Arthritis Mother   . Heart disease  Paternal Grandmother   . Alcoholism Father   . Hyperlipidemia      Parent  . Mental illness      Parent  . Diabetes      Parent     Social History  Substance Use Topics  . Smoking status: Former Research scientist (life sciences)  . Smokeless tobacco: Never Used  . Alcohol use No    Allergies as of 05/01/2016 - Review Complete 05/01/2016  Allergen Reaction Noted  . Lisinopril Cough 11/03/2014  . Milk-related compounds Diarrhea and Nausea And Vomiting 05/09/2011    Review of Systems:    All systems reviewed and negative except where noted in HPI.  Physical Exam:  BP 117/68   Pulse 83   Temp 98.7 F (37.1 C) (Oral)   Ht 5\' 5"  (1.651 m)   Wt 247 lb (112 kg)   BMI 41.10 kg/m  No LMP recorded. Patient has had an ablation. Psych:  Alert and cooperative. Normal mood and affect. General:   Alert,  Well-developed, Morbid obesity,well-nourished, pleasant and cooperative in NAD Head:  Normocephalic and atraumatic. Eyes:  Sclera clear, no icterus.   Conjunctiva pink. Ears:  Normal auditory acuity. Nose:  No deformity, discharge, or lesions. Mouth:  No deformity or lesions,oropharynx pink & moist. Neck:  Supple; no masses or thyromegaly. Lungs:  Respirations even and unlabored.  Clear throughout to auscultation.   No wheezes, crackles, or rhonchi. No acute distress. Heart:  Regular rate and rhythm; no murmurs, clicks, rubs, or gallops. Abdomen:  Normal bowel sounds.  No bruits.  Soft, non-tender and non-distended without masses, hepatosplenomegaly or hernias noted.  No guarding or rebound tenderness.  Negative Carnett sign.  A PEG tube was noted in the left upper abdomen. Rectal:  Deferred.  Msk:  Symmetrical without gross deformities.  Good, equal movement & strength bilaterally. Pulses:  Normal pulses noted. Extremities:  No clubbing or edema.  No cyanosis. Neurologic:  Alert and oriented x3;  grossly normal neurologically. Skin:  Intact without significant lesions or rashes.  No jaundice. Lymph  Nodes:  No significant cervical adenopathy. Psych:  Alert and cooperative. Normal mood and affect.  Imaging Studies: No results found.  Assessment and Plan:   Denise Davis is a 51 y.o. y/o female Who comes in today with noncardiac chest pain and possible esophageal spasms with a request to have the PEG tube removed.  The patient had the PEG tube deflated and was removed easily.  The area was cleaned and bandaged.  The patient will be set up for an EGD due to her chronic nausea and epigastric pain when she eats.  She will also be set up for esophageal manometry due to her possible esophageal spasms as the cause of her noncardiac chest pain.  The patient has been explained the plan and agrees with it.    Lucilla Lame, MD. Marval Regal   Note: This dictation was prepared with Dragon dictation along with smaller phrase technology. Any transcriptional errors that result from this process are unintentional.

## 2016-05-02 ENCOUNTER — Ambulatory Visit: Payer: PPO | Admitting: Physical Therapy

## 2016-05-06 ENCOUNTER — Encounter: Payer: Self-pay | Admitting: Physical Therapy

## 2016-05-06 ENCOUNTER — Ambulatory Visit: Payer: PPO | Attending: Orthopedic Surgery | Admitting: Physical Therapy

## 2016-05-06 DIAGNOSIS — R262 Difficulty in walking, not elsewhere classified: Secondary | ICD-10-CM

## 2016-05-06 DIAGNOSIS — M25561 Pain in right knee: Secondary | ICD-10-CM | POA: Diagnosis not present

## 2016-05-06 DIAGNOSIS — M6281 Muscle weakness (generalized): Secondary | ICD-10-CM | POA: Diagnosis not present

## 2016-05-06 NOTE — Therapy (Signed)
[place] [facility] [address], [place], [address] Phone: [phone]   Fax:  [phone]  Physical Therapy Treatment  Patient Details  Name: Denise Davis MRN: [id number] Date of Birth: [birth year] Referring Provider: Domingo Pulse MD  Encounter Date: 05/06/2016      PT End of Session - 05/06/16 0845    Visit Number 3   Number of Visits 12   Date for PT Re-Evaluation 06/05/16   Authorization Type 3   Authorization Time Period 10 (G codes)   PT Start Time 0802   PT Stop Time 0845   PT Time Calculation (min) 43 min   Activity Tolerance Patient tolerated treatment well;Patient limited by fatigue;Patient limited by pain   Behavior During Therapy [facility] for tasks assessed/performed      Past Medical History:  Diagnosis Date  . Allergic rhinitis   . Anxiety   . Depression   . Diabetes mellitus   . DVT of leg (deep venous thrombosis) ([place]) 12/2007   left leg, given warfarin for 6 mos  . GERD (gastroesophageal reflux disease)   . H/O hiatal hernia   . Hyperlipidemia   . Hypertension   . Morbid obesity ([place])   . Pericarditis    Diagnosed/treated at [facility]; per patient 4 episodes per cardiologist Dr. Edwin Dada persistant chest pain unclear if pericarditis verusus neuropsychogenic   . Renal cell cancer ([place]) 2007-07-21   S/p nephrectomy (left)  . Seizure disorder ([place])   . Seizures ([place])   . Shortness of breath   . Syncope     Past Surgical History:  Procedure Laterality Date  . ENDOMETRIAL ABLATION  20-Jul-2005  . INSERTION OF MESH  07-20-12  . LEFT HEART CATHETERIZATION WITH CORONARY ANGIOGRAM N/A 08/30/2013   Procedure: LEFT HEART CATHETERIZATION WITH CORONARY ANGIOGRAM;  Surgeon: Troy Sine, MD;  Location: [facility] CATH LAB;  Service: Cardiovascular;  Laterality: N/A;  . NEPHRECTOMY    . TUBAL LIGATION    . UMBILICAL HERNIA REPAIR      There were no vitals filed for this visit.      Subjective Assessment -  05/06/16 0805    Subjective Patient reports last week she walked too much and was in more pain and had to cancel therapy session. She is to have follow up with family physician this week. Currently reports her right knee is sore.   Limitations House hold activities;Walking;Sitting;Standing   Patient Stated Goals decreased pain improve motion and strength to be able to walk and prepare for surgery on left knee   Currently in Pain? Yes   Pain Score 6    Pain Orientation Right   Pain Descriptors / Indicators Aching;Stabbing   Pain Type Acute pain;Surgical pain  03/21/2017   Pain Onset More than a month ago   Pain Frequency Intermittent        Objective: AROM: 0-110 flexion right knee on arrival: post treatment 0-125 degrees flexion Gait; ambulating with FW rolling walker with good cadence and gait pattern WNL  Treatment:  Manual therapy: STM performed to right LE quadriceps, hamstring and calf muscles, superficial techniques, with patient sitting: goal: pain, improve ROM Patella mobilization with patella mobilizer: distraction, caudal glides 3 sets Therapeutic exercise: patient performed exercises with verbal, tactile cues and demonstration of therapist: Sitting: Ball roll outs with 3# weight on ankles x 2 min.  Hip adduction with ball and glute sets x 10 Hip abduction with manual resistance x 15 reps  with 5 second hold at end ROM Knee extension with 3#  2 x 15 reps each  LE Knee flexion with red resistive band 2 x 15 reps with assistance of therapist  Knee flexion isometric multi angle x 3 sets, 3 angles of flexion  NuStep x 7 min. At end of session (unbilled time) for ROM/weight shifting  Patient response to treatment: Patient improved ability to perform exercises following manual therapy techniques and patella mobilization with decreased knee pain to mild 2-3/10. Patient demonstrated improved technique with exercises with minimal VC for correct positioning, alignment. Improved  ability to ambulate with mild discomfort at end of session.         PT Education - 05/06/16 0809    Education provided Yes   Education Details HEP: re assessed home exercises   Person(s) Educated Patient   Methods Explanation;Demonstration;Verbal cues   Comprehension Verbalized understanding;Returned demonstration;Verbal cues required             PT Long Term Goals - 04/24/16 1100      PT LONG TERM GOAL #1   Title Patient with demonstrate improved function with right LE with household ambulation, daily tasks with less difficulty as indicated by LEFS score of 45/80 or better by 06/05/2016   Baseline LEFS 28/80   Status New     PT LONG TERM GOAL #2   Title Patient will be able to ambulate with walker with minimal difficulty with 10MW 12 seconds or better by 06/05/2016 demonstrating improved community ambulation   Baseline deferred assessment    Status New     PT LONG TERM GOAL #3   Title Patient will be indepenent with home program for flexibility, strength and self management by 06/05/2016    Baseline requires assistance and instruction to perform exercises and progress appropriately   Status New               Plan - 05/06/16 1028    Clinical Impression Statement Patient continues with limitations of pain, weakness and decresaed endurance with activity following TKA right knee. She is progressing slowly due to decreased function and increased pain in left knee which is awaiting TKA. She is responding well to treatment with reported decreased pain and improved ability to walk with less difficulty. She should continue to improve with physical therapy intervention.    PT Frequency 2x / week   PT Duration 6 weeks   PT Treatment/Interventions Electrical Stimulation;Cryotherapy;Moist Heat;Patient/family education;Neuromuscular re-education;Therapeutic exercise;Manual techniques   PT Next Visit Plan manual techniques, progressive exercise, pain control   PT Home Exercise Plan  ROM, strengthening exercises      Patient will benefit from skilled therapeutic intervention in order to improve the following deficits and impairments:  Decreased strength, Decreased balance, Pain, Impaired perceived functional ability, Decreased activity tolerance, Decreased endurance, Difficulty walking  Visit Diagnosis: Acute pain of right knee  Difficulty in walking, not elsewhere classified  Muscle weakness (generalized)     Problem List Patient Active Problem List   Diagnosis Date Noted  . Neuropathy ([place]) 02/12/2016  . Decreased pedal pulses 02/12/2016  . Allergic rhinitis 02/12/2016  . Rash and nonspecific skin eruption 01/09/2016  . Preop examination 01/09/2016  . Hypokalemia 12/08/2015  . Sleep apnea 12/01/2015  . Palpitations 12/01/2015  . Sore throat 11/02/2015  . Status post gastric bypass for obesity 10/03/2015  . Osteoarthritis 10/03/2015  . Anxiety and depression 02/13/2015  . Seizure disorder ([place]) 02/13/2015  . GERD (gastroesophageal reflux disease) 02/13/2015  . Chronic  respiratory failure ([place]) 08/28/2013  . Syncope 08/25/2013  . Right sided weakness 08/25/2013  . Vocal cord dysfunction 08/18/2012  . Wheezing 08/16/2012  . Shortness of breath 08/16/2012  . Acute respiratory distress 08/16/2012  . Chest pain 05/09/2011  . Diabetes mellitus ([place])   . Hypertension     Jomarie Longs PT 05/06/2016, 10:30 AM  [place] [facility] [address], [place], [address] Phone: [phone]   Fax:  [phone]  Name: TEONDRA LANGIN MRN: [id number] Date of Birth: 05-02-1965

## 2016-05-09 ENCOUNTER — Encounter: Payer: Self-pay | Admitting: Physical Therapy

## 2016-05-09 ENCOUNTER — Ambulatory Visit: Payer: PPO | Admitting: Physical Therapy

## 2016-05-09 DIAGNOSIS — M6281 Muscle weakness (generalized): Secondary | ICD-10-CM

## 2016-05-09 DIAGNOSIS — R262 Difficulty in walking, not elsewhere classified: Secondary | ICD-10-CM

## 2016-05-09 DIAGNOSIS — M25561 Pain in right knee: Secondary | ICD-10-CM

## 2016-05-09 NOTE — Therapy (Signed)
[place] [facility] [address] [place], [address] Phone: [phone]   Fax:  [phone]  Physical Therapy Treatment  Patient Details  Name: Denise Davis MRN: [id number] Date of Birth: February 01, 1966 Referring Provider: Domingo Pulse MD  Encounter Date: 05/09/2016      PT End of Session - 05/09/16 0845    Visit Number 4   Number of Visits 12   Date for PT Re-Evaluation 06/05/16   Authorization Type 4   Authorization Time Period 10 (G codes)   PT Start Time 0802   PT Stop Time 0850   PT Time Calculation (min) 48 min   Activity Tolerance Patient tolerated treatment well;Patient limited by fatigue;Patient limited by pain   Behavior During Therapy [facility] for tasks assessed/performed      Past Medical History:  Diagnosis Date  . Allergic rhinitis   . Anxiety   . Depression   . Diabetes mellitus   . DVT of leg (deep venous thrombosis) ([place]) 12/2007   left leg, given warfarin for 6 mos  . GERD (gastroesophageal reflux disease)   . H/O hiatal hernia   . Hyperlipidemia   . Hypertension   . Morbid obesity ([place])   . Pericarditis    Diagnosed/treated at [facility]; per patient 4 episodes per cardiologist Dr. Edwin Dada persistant chest pain unclear if pericarditis verusus neuropsychogenic   . Renal cell cancer ([place]) 07/16/07   S/p nephrectomy (left)  . Seizure disorder ([place])   . Seizures ([place])   . Shortness of breath   . Syncope     Past Surgical History:  Procedure Laterality Date  . ENDOMETRIAL ABLATION  July 15, 2005  . INSERTION OF MESH  07/15/12  . LEFT HEART CATHETERIZATION WITH CORONARY ANGIOGRAM N/A 08/30/2013   Procedure: LEFT HEART CATHETERIZATION WITH CORONARY ANGIOGRAM;  Surgeon: Troy Sine, MD;  Location[facility] CATH LAB;  Service: Cardiovascular;  Laterality: N/A;  . NEPHRECTOMY    . TUBAL LIGATION    . UMBILICAL HERNIA REPAIR      There were no vitals filed for this visit.      Subjective Assessment -  05/09/16 0807    Subjective Patient reports she is having some increased pain this morning because of the way she stepped on it. Her soreness is lateral at knee.    Limitations House hold activities;Walking;Sitting;Standing   Patient Stated Goals decreased pain improve motion and strength to be able to walk and prepare for surgery on left knee   Currently in Pain? Yes   Pain Score 6    Pain Location Knee   Pain Orientation Right   Pain Descriptors / Indicators Aching   Pain Type Acute pain;Surgical pain  03/21/2016   Pain Onset More than a month ago   Pain Frequency Intermittent      Objective: Gait; ambulating with FW rolling walker with good cadence and gait pattern WNL Palpation: no point tenderness noted right knee  Treatment:  Manual therapy: Patella mobilization with patella mobilizer: distraction, caudal glides 3 sets Therapeutic exercise: patient performed exercises with verbal, tactile cues anddemonstration of therapist: Sitting: Rocker board DF/PF with and without balance stones on top x 2-3 min. For ROM/strengthening/balance Hip adduction with ball andglute sets x 20 Hip abduction with manual resistance x 15 reps with 5 second hold at end ROM Knee extension with 3# 2 x 15 reps each LE Knee flexion with red resistive band 2 x 15 reps with assistance of therapist  Knee flexion isometric multi angle x 3 sets, 3 angles of flexion  NuStep x 10 min. At end of session (unbilled time) for ROM/weight shifting  Patient response to treatment: Patient with decreased pain by 50% to 3/10 following exercises. She demonstrates improved endurance on Nustep from 7 to 10 min. With mild fatigue noted at end of session. She improved strength and motor control with all exercises with repetition and VC.         PT Education - 05/09/16 0844    Education provided Yes   Education Details re viewed exercise progression for next session to include standing activities, HEP to  continue with strengthening as instructed   Person(s) Educated Patient   Methods Explanation   Comprehension Verbalized understanding             PT Long Term Goals - 04/24/16 1100      PT LONG TERM GOAL #1   Title Patient with demonstrate improved function with right LE with household ambulation, daily tasks with less difficulty as indicated by LEFS score of 45/80 or better by 06/05/2016   Baseline LEFS 28/80   Status New     PT LONG TERM GOAL #2   Title Patient will be able to ambulate with walker with minimal difficulty with 10MW 12 seconds or better by 06/05/2016 demonstrating improved community ambulation   Baseline deferred assessment    Status New     PT LONG TERM GOAL #3   Title Patient will be indepenent with home program for flexibility, strength and self management by 06/05/2016    Baseline requires assistance and instruction to perform exercises and progress appropriately   Status New               Plan - 05/09/16 0845    Clinical Impression Statement Patient is progressing well with strength, no falls and improving ROM right LE. She should be able to progress to sit to stand and weight shifting activties next session.    Rehab Potential Good   PT Frequency 2x / week   PT Duration 6 weeks   PT Treatment/Interventions Electrical Stimulation;Cryotherapy;Moist Heat;Patient/family education;Neuromuscular re-education;Therapeutic exercise;Manual techniques   PT Next Visit Plan manual techniques, progressive exercise, pain control   PT Home Exercise Plan ROM, strengthening exercises      Patient will benefit from skilled therapeutic intervention in order to improve the following deficits and impairments:  Decreased strength, Decreased balance, Pain, Impaired perceived functional ability, Decreased activity tolerance, Decreased endurance, Difficulty walking  Visit Diagnosis: Muscle weakness (generalized)  Acute pain of right knee  Difficulty in walking, not  elsewhere classified     Problem List Patient Active Problem List   Diagnosis Date Noted  . Neuropathy ([place]) 02/12/2016  . Decreased pedal pulses 02/12/2016  . Allergic rhinitis 02/12/2016  . Rash and nonspecific skin eruption 01/09/2016  . Preop examination 01/09/2016  . Hypokalemia 12/08/2015  . Sleep apnea 12/01/2015  . Palpitations 12/01/2015  . Sore throat 11/02/2015  . Status post gastric bypass for obesity 10/03/2015  . Osteoarthritis 10/03/2015  . Anxiety and depression 02/13/2015  . Seizure disorder ([place]) 02/13/2015  . GERD (gastroesophageal reflux disease) 02/13/2015  . Chronic respiratory failure ([place]) 08/28/2013  . Syncope 08/25/2013  . Right sided weakness 08/25/2013  . Vocal cord dysfunction 08/18/2012  . Wheezing 08/16/2012  . Shortness of breath 08/16/2012  . Acute respiratory distress 08/16/2012  . Chest pain 05/09/2011  . Diabetes mellitus ([place])   . Hypertension  Jomarie Longs PT 05/10/2016, 9:09 AM  [place] [facility] [address], [place], [address] Phone: [phone]   Fax:  [phone]  Name: Denise Davis MRN: [id number] Date of Birth: [birth year]

## 2016-05-10 DIAGNOSIS — M79661 Pain in right lower leg: Secondary | ICD-10-CM | POA: Diagnosis not present

## 2016-05-10 DIAGNOSIS — M79604 Pain in right leg: Secondary | ICD-10-CM | POA: Diagnosis not present

## 2016-05-10 DIAGNOSIS — Z96651 Presence of right artificial knee joint: Secondary | ICD-10-CM | POA: Diagnosis not present

## 2016-05-10 DIAGNOSIS — M7989 Other specified soft tissue disorders: Secondary | ICD-10-CM | POA: Diagnosis not present

## 2016-05-13 ENCOUNTER — Ambulatory Visit: Payer: PPO | Admitting: Physical Therapy

## 2016-05-13 ENCOUNTER — Encounter: Payer: Self-pay | Admitting: *Deleted

## 2016-05-13 DIAGNOSIS — Z471 Aftercare following joint replacement surgery: Secondary | ICD-10-CM | POA: Diagnosis not present

## 2016-05-13 DIAGNOSIS — Z96651 Presence of right artificial knee joint: Secondary | ICD-10-CM | POA: Diagnosis not present

## 2016-05-13 DIAGNOSIS — M7631 Iliotibial band syndrome, right leg: Secondary | ICD-10-CM | POA: Diagnosis not present

## 2016-05-13 DIAGNOSIS — M1712 Unilateral primary osteoarthritis, left knee: Secondary | ICD-10-CM | POA: Diagnosis not present

## 2016-05-13 DIAGNOSIS — M2110 Varus deformity, not elsewhere classified, unspecified site: Secondary | ICD-10-CM | POA: Diagnosis not present

## 2016-05-14 ENCOUNTER — Encounter
Admission: RE | Disposition: A | Discharge status: Home / Self Care | Payer: Self-pay | Source: Ambulatory Visit | Attending: Gastroenterology

## 2016-05-14 ENCOUNTER — Ambulatory Visit
Admission: RE | Admit: 2016-05-14 | Discharge: 2016-05-14 | Disposition: A | Discharge status: Home / Self Care | Payer: PPO | Source: Ambulatory Visit | Attending: Gastroenterology | Admitting: Gastroenterology

## 2016-05-14 ENCOUNTER — Ambulatory Visit: Payer: PPO | Admitting: Anesthesiology

## 2016-05-14 ENCOUNTER — Encounter: Payer: Self-pay | Admitting: *Deleted

## 2016-05-14 ENCOUNTER — Ambulatory Visit: Payer: Self-pay | Admitting: Family Medicine

## 2016-05-14 DIAGNOSIS — R110 Nausea: Secondary | ICD-10-CM

## 2016-05-14 DIAGNOSIS — Z87891 Personal history of nicotine dependence: Secondary | ICD-10-CM | POA: Insufficient documentation

## 2016-05-14 DIAGNOSIS — K228 Other specified diseases of esophagus: Secondary | ICD-10-CM | POA: Diagnosis not present

## 2016-05-14 DIAGNOSIS — Z7982 Long term (current) use of aspirin: Secondary | ICD-10-CM | POA: Insufficient documentation

## 2016-05-14 DIAGNOSIS — Z85528 Personal history of other malignant neoplasm of kidney: Secondary | ICD-10-CM | POA: Insufficient documentation

## 2016-05-14 DIAGNOSIS — Z86718 Personal history of other venous thrombosis and embolism: Secondary | ICD-10-CM | POA: Diagnosis not present

## 2016-05-14 DIAGNOSIS — R1013 Epigastric pain: Secondary | ICD-10-CM

## 2016-05-14 DIAGNOSIS — K219 Gastro-esophageal reflux disease without esophagitis: Secondary | ICD-10-CM | POA: Insufficient documentation

## 2016-05-14 DIAGNOSIS — F329 Major depressive disorder, single episode, unspecified: Secondary | ICD-10-CM | POA: Diagnosis not present

## 2016-05-14 DIAGNOSIS — F419 Anxiety disorder, unspecified: Secondary | ICD-10-CM | POA: Insufficient documentation

## 2016-05-14 DIAGNOSIS — Z905 Acquired absence of kidney: Secondary | ICD-10-CM | POA: Diagnosis not present

## 2016-05-14 DIAGNOSIS — Z9884 Bariatric surgery status: Secondary | ICD-10-CM | POA: Diagnosis not present

## 2016-05-14 DIAGNOSIS — I10 Essential (primary) hypertension: Secondary | ICD-10-CM | POA: Diagnosis not present

## 2016-05-14 DIAGNOSIS — E785 Hyperlipidemia, unspecified: Secondary | ICD-10-CM | POA: Insufficient documentation

## 2016-05-14 DIAGNOSIS — K224 Dyskinesia of esophagus: Secondary | ICD-10-CM | POA: Diagnosis not present

## 2016-05-14 DIAGNOSIS — G40909 Epilepsy, unspecified, not intractable, without status epilepticus: Secondary | ICD-10-CM | POA: Insufficient documentation

## 2016-05-14 DIAGNOSIS — K449 Diaphragmatic hernia without obstruction or gangrene: Secondary | ICD-10-CM | POA: Insufficient documentation

## 2016-05-14 DIAGNOSIS — Z6841 Body Mass Index (BMI) 40.0 and over, adult: Secondary | ICD-10-CM | POA: Insufficient documentation

## 2016-05-14 DIAGNOSIS — K2289 Other specified disease of esophagus: Secondary | ICD-10-CM

## 2016-05-14 DIAGNOSIS — E1151 Type 2 diabetes mellitus with diabetic peripheral angiopathy without gangrene: Secondary | ICD-10-CM | POA: Insufficient documentation

## 2016-05-14 HISTORY — PX: ESOPHAGOGASTRODUODENOSCOPY (EGD) WITH PROPOFOL: SHX5813

## 2016-05-14 SURGERY — ESOPHAGOGASTRODUODENOSCOPY (EGD) WITH PROPOFOL
Anesthesia: General

## 2016-05-14 MED ORDER — PROPOFOL 10 MG/ML IV BOLUS
INTRAVENOUS | Status: DC | PRN
  Administered 2016-05-14: 120 mg via INTRAVENOUS

## 2016-05-14 MED ORDER — LIDOCAINE HCL (PF) 2 % IJ SOLN
INTRAMUSCULAR | Status: AC
  Filled 2016-05-14: qty 2

## 2016-05-14 MED ORDER — SODIUM CHLORIDE 0.9 % IV SOLN
INTRAVENOUS | Status: DC
  Administered 2016-05-14: 09:00:00 via INTRAVENOUS

## 2016-05-14 MED ORDER — PROPOFOL 500 MG/50ML IV EMUL
INTRAVENOUS | Status: DC | PRN
  Administered 2016-05-14: 30 ug/kg/min via INTRAVENOUS

## 2016-05-14 MED ORDER — PROPOFOL 500 MG/50ML IV EMUL
INTRAVENOUS | Status: AC
  Filled 2016-05-14: qty 50

## 2016-05-14 NOTE — Anesthesia Postprocedure Evaluation (Signed)
Anesthesia Post Note  Patient: Denise Davis  Procedure(s) Performed: Procedure(s) (LRB): ESOPHAGOGASTRODUODENOSCOPY (EGD) WITH PROPOFOL (N/A)  Patient location during evaluation: PACU Anesthesia Type: General Level of consciousness: awake Pain management: pain level controlled Vital Signs Assessment: post-procedure vital signs reviewed and stable Respiratory status: spontaneous breathing Cardiovascular status: stable Anesthetic complications: no     Last Vitals:  Vitals:   05/14/16 0930 05/14/16 0940  BP: 125/75 125/75  Pulse: 72 72  Resp: 18 16  Temp: (!) 35.9 C     Last Pain:  Vitals:   05/14/16 0930  TempSrc: Tympanic                 VAN STAVEREN,Shamal Stracener

## 2016-05-14 NOTE — Anesthesia Preprocedure Evaluation (Signed)
Anesthesia Evaluation  Patient identified by MRN, date of birth, ID band Patient awake    Reviewed: Allergy & Precautions, NPO status , Patient's Chart, lab work & pertinent test results  Airway Mallampati: II       Dental  (+) Teeth Intact   Pulmonary shortness of breath, sleep apnea and Continuous Positive Airway Pressure Ventilation , former smoker,     + decreased breath sounds      Cardiovascular Exercise Tolerance: Good hypertension, Pt. on medications and Pt. on home beta blockers + Peripheral Vascular Disease   Rhythm:Regular     Neuro/Psych Seizures -,  Anxiety Depression    GI/Hepatic Neg liver ROS, hiatal hernia, GERD  Medicated,  Endo/Other  diabetes  Renal/GU Renal disease     Musculoskeletal   Abdominal (+) + obese,   Peds  Hematology   Anesthesia Other Findings   Reproductive/Obstetrics                             Anesthesia Physical Anesthesia Plan  ASA: III  Anesthesia Plan: General   Post-op Pain Management:    Induction: Intravenous  Airway Management Planned: Natural Airway and Nasal Cannula  Additional Equipment:   Intra-op Plan:   Post-operative Plan:   Informed Consent: I have reviewed the patients History and Physical, chart, labs and discussed the procedure including the risks, benefits and alternatives for the proposed anesthesia with the patient or authorized representative who has indicated his/her understanding and acceptance.     Plan Discussed with: Surgeon  Anesthesia Plan Comments:         Anesthesia Quick Evaluation

## 2016-05-14 NOTE — Transfer of Care (Signed)
Immediate Anesthesia Transfer of Care Note  Patient: Denise Davis  Procedure(s) Performed: Procedure(s): ESOPHAGOGASTRODUODENOSCOPY (EGD) WITH PROPOFOL (N/A)  Patient Location: PACU  Anesthesia Type:General  Level of Consciousness: awake  Airway & Oxygen Therapy: Patient Spontanous Breathing and Patient connected to nasal cannula oxygen  Post-op Assessment: Report given to RN and Post -op Vital signs reviewed and stable  Post vital signs: Reviewed  Last Vitals:  Vitals:   05/14/16 0819  BP: 115/79  Pulse: 69  Resp: 18  Temp: 36.2 C    Last Pain:  Vitals:   05/14/16 0819  TempSrc: Tympanic         Complications: No apparent anesthesia complications

## 2016-05-14 NOTE — Anesthesia Post-op Follow-up Note (Signed)
Anesthesia QCDR form completed.        

## 2016-05-14 NOTE — H&P (Signed)
Lucilla Lame, MD [facility] [address] [place], [place] [address] Phone: [phone] Fax : [phone]  Primary Care Physician:  Tommi Rumps, MD Primary Gastroenterologist:  Dr. Allen Norris  Pre-Procedure History & Physical: HPI:  Denise Davis is a 51 y.o. female is here for an endoscopy.   Past Medical History:  Diagnosis Date  . Allergic rhinitis   . Anxiety   . Depression   . Diabetes mellitus   . DVT of leg (deep venous thrombosis) ([place]) 12/2007   left leg, given warfarin for 6 mos  . GERD (gastroesophageal reflux disease)   . H/O hiatal hernia   . Hyperlipidemia   . Hypertension   . Morbid obesity ([place])   . Pericarditis    Diagnosed/treated at [facility]; per patient 4 episodes per cardiologist Dr. Edwin Dada persistant chest pain unclear if pericarditis verusus neuropsychogenic   . Renal cell cancer ([place]) 2009   S/p nephrectomy (left)  . Seizure disorder ([place])   . Seizures ([place])   . Shortness of breath   . Syncope     Past Surgical History:  Procedure Laterality Date  . ENDOMETRIAL ABLATION  2007  . INSERTION OF MESH  2014  . LEFT HEART CATHETERIZATION WITH CORONARY ANGIOGRAM N/A 08/30/2013   Procedure: LEFT HEART CATHETERIZATION WITH CORONARY ANGIOGRAM;  Surgeon: Troy Sine, MD;  Location: [facility] CATH LAB;  Service: Cardiovascular;  Laterality: N/A;  . NEPHRECTOMY    . TUBAL LIGATION    . UMBILICAL HERNIA REPAIR      Prior to Admission medications   Medication Sig Start Date End Date Taking? Authorizing Provider  amLODipine (NORVASC) 10 MG tablet Take 1 tablet (10 mg total) by mouth daily. 10/02/15  Yes Leone Haven, MD  aspirin EC 81 MG tablet Take 1 tablet (81 mg total) by mouth daily. 10/02/15  Yes Leone Haven, MD  carvedilol (COREG) 25 MG tablet Take 1 tablet (25 mg total) by mouth 2 (two) times daily with a meal. 10/02/15  Yes Leone Haven, MD  gabapentin (NEURONTIN) 300 MG capsule Take 8 capsules (2,400 mg total) by mouth at bedtime. 02/12/16   Yes Leone Haven, MD  HYDROcodone-acetaminophen (NORCO/VICODIN) 5-325 MG tablet Take 1 tablet by mouth every 6 (six) hours as needed for moderate pain. 02/12/16  Yes Leone Haven, MD  levETIRAcetam (KEPPRA) 750 MG tablet  02/08/16  Yes Historical Provider, MD  loratadine (CLARITIN) 10 MG tablet Take 1 tablet (10 mg total) by mouth daily. 02/12/16  Yes Leone Haven, MD  losartan (COZAAR) 100 MG tablet Take 1 tablet (100 mg total) by mouth daily. 03/26/16  Yes Leone Haven, MD  Melatonin 3 MG TABS Take 3 mg by mouth. 09/20/15  Yes Historical Provider, MD  mirtazapine (REMERON) 15 MG tablet Take 1 tablet (15 mg total) by mouth at bedtime. 10/02/15  Yes Leone Haven, MD  Multiple Vitamins-Minerals (MULTIVITAMIN WITH MINERALS) tablet Take 1 tablet by mouth daily. Reported on 06/16/2015   Yes Historical Provider, MD  nitroGLYCERIN (NITROSTAT) 0.4 MG SL tablet Place 1 tablet (0.4 mg total) under the tongue every 5 (five) minutes as needed for chest pain. (may repeat every 5 minutes but seek medical help if pain persists after 3 tablets) 04/23/16  Yes Leone Haven, MD  omeprazole (PRILOSEC) 40 MG capsule Take 1 capsule (40 mg total) by mouth 2 (two) times daily before a meal. 12/02/15  Yes Srikar Sudini, MD  QUEtiapine (SEROQUEL) 100 MG tablet  04/08/16  Yes Historical Provider, MD  sertraline (ZOLOFT) 100 MG tablet Take 2 tablets (200 mg total) by mouth at bedtime. 02/12/16  Yes Leone Haven, MD  ursodiol (ACTIGALL) 300 MG capsule Take 1 capsule (300 mg total) by mouth 2 (two) times daily. 10/02/15  Yes Leone Haven, MD  atorvastatin (LIPITOR) 10 MG tablet Take 10 mg by mouth. 04/12/16 05/12/16  Historical Provider, MD  calcium-vitamin D 250-100 MG-UNIT tablet Take 1 tablet by mouth 2 (two) times daily. Reported on 06/16/2015 10/02/15   Leone Haven, MD  Cholecalciferol (VITAMIN D-3) 1000 units CAPS Take 1 capsule (1,000 Units total) by mouth daily. Reported on 06/16/2015 10/02/15    Leone Haven, MD  colchicine 0.6 MG tablet One twice a day 08/25/14   Historical Provider, MD  enoxaparin (LOVENOX) 40 MG/0.4ML injection  03/24/16   Historical Provider, MD  meloxicam (MOBIC) 7.5 MG tablet  03/24/16   Historical Provider, MD  ondansetron (ZOFRAN-ODT) 4 MG disintegrating tablet DISSOLVE ONE TABLET IN MOUTH EVERY 8 HOURS AS NEEDED FOR NAUSEA AND VOMITING 12/25/15   Leone Haven, MD  potassium chloride 20 MEQ/15ML (10%) SOLN Take 30 mLs (40 mEq total) by mouth 2 (two) times daily. 02/12/16   Leone Haven, MD  promethazine (PHENERGAN) 25 MG tablet TAKE ONE TABLET BY MOUTH EVERY 8 HOURS AS NEEDED FOR NAUSEA OR  VOMITING 04/05/16   Leone Haven, MD  triamcinolone cream (KENALOG) 0.1 % Apply 1 application topically 2 (two) times daily. 01/09/16   Leone Haven, MD  vitamin B-12 (CYANOCOBALAMIN) 500 MCG tablet Take by mouth. 10/02/15   Historical Provider, MD    Allergies as of 05/01/2016 - Review Complete 05/01/2016  Allergen Reaction Noted  . Lisinopril Cough 11/03/2014  . Milk-related compounds Diarrhea and Nausea And Vomiting 05/09/2011    Family History  Problem Relation Age of Onset  . Hypertension Mother   . Breast cancer Mother   . Arthritis Mother   . Heart disease Paternal Grandmother   . Alcoholism Father   . Hyperlipidemia      Parent  . Mental illness      Parent  . Diabetes      Parent    Social History   Social History  . Marital status: Married    Spouse name: N/A  . Number of children: N/A  . Years of education: N/A   Occupational History  . Not on file.   Social History Main Topics  . Smoking status: Former Research scientist (life sciences)  . Smokeless tobacco: Never Used  . Alcohol use No  . Drug use: No  . Sexual activity: Not on file   Other Topics Concern  . 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 Neck:  Supple; no masses or thyromegaly. Lungs:  Clear throughout to auscultation.    Heart:  Regular rate and rhythm. Abdomen:  Soft, nontender and nondistended. Normal bowel sounds, without guarding, and without rebound.   Neurologic:  Alert and  oriented x4;  grossly normal neurologically.  Impression/Plan: Denise Davis is here for an endoscopy to be performed for nausea and epigastric pain  Risks, benefits, limitations, and alternatives regarding  endoscopy have been reviewed with the patient.  Questions have been answered.  All parties agreeable.   Lucilla Lame, MD  05/14/2016, 9:12 AM

## 2016-05-14 NOTE — Op Note (Signed)
[facility] Patient Name: Denise Davis Procedure Date: 05/14/2016 9:10 AM MRN: [id number] Account #: [id number] Date of Birth: 1965-04-15 Admit Type: Outpatient Age: 51 Room: [facility] ENDO ROOM 4 Gender: Female Note Status: Finalized Procedure:            Upper GI endoscopy Indications:          Epigastric abdominal pain, Nausea Providers:            Lucilla Lame MD, MD Referring MD:         Randall Hiss g. Caryl Bis (Referring MD) Medicines:            Propofol per Anesthesia Complications:        No immediate complications. Procedure:            Pre-Anesthesia Assessment:                       - Prior to the procedure, a History and Physical was                        performed, and patient medications and allergies were                        reviewed. The patient's tolerance of previous                        anesthesia was also reviewed. The risks and benefits of                        the procedure and the sedation options and risks were                        discussed with the patient. All questions were                        answered, and informed consent was obtained. Prior                        Anticoagulants: The patient has taken no previous                        anticoagulant or antiplatelet agents. ASA Grade                        Assessment: II - A patient with mild systemic disease.                        After reviewing the risks and benefits, the patient was                        deemed in satisfactory condition to undergo the                        procedure.                       After obtaining informed consent, the endoscope was                        passed under direct vision. Throughout the procedure,  the patient's blood pressure, pulse, and oxygen                        saturations were monitored continuously. The Endoscope                        was introduced through the mouth, and advanced to the                     jejunum. The upper GI endoscopy was accomplished                        without difficulty. The patient tolerated the procedure                        well. Findings:      The Z-line was irregular and was found at the gastroesophageal junction.      A small hiatal hernia was present.      Evidence of a gastric bypass was found. A gastric pouch with a normal       size was found. The staple line appeared intact. The gastrojejunal       anastomosis was characterized by healthy appearing mucosa. This was       traversed. The pouch-to-jejunum limb was characterized by healthy       appearing mucosa.      The examined jejunum was normal. Impression:           - Z-line irregular, at the gastroesophageal junction.                       - Small hiatal hernia.                       - Gastric bypass with a normal-sized pouch and intact                        staple line. Gastrojejunal anastomosis characterized by                        healthy appearing mucosa.                       - Normal examined jejunum.                       - No specimens collected. Recommendation:       - Discharge patient to home.                       - Resume previous diet.                       - Continue present medications. Procedure Code(s):    --- Professional ---                       [phone], Esophagogastroduodenoscopy, flexible, transoral;                        diagnostic, including collection of specimen(s) by                        brushing or washing, when performed (separate procedure) Diagnosis Code(s):    ---  Professional ---                       R11.0, Nausea                       R10.13, Epigastric pain                       Z98.84, Bariatric surgery status                       K22.8, Other specified diseases of esophagus CPT copyright 2016 American Medical Association. All rights reserved. The codes documented in this report are preliminary and upon coder review may  be revised to  meet current compliance requirements. Lucilla Lame MD, MD 05/14/2016 9:35:32 AM This report has been signed electronically. Number of Addenda: 0 Note Initiated On: 05/14/2016 9:10 AM      [facility]

## 2016-05-15 ENCOUNTER — Encounter: Payer: Self-pay | Admitting: Gastroenterology

## 2016-05-16 ENCOUNTER — Ambulatory Visit: Payer: Self-pay | Admitting: Family Medicine

## 2016-05-16 ENCOUNTER — Encounter: Payer: PPO | Admitting: Physical Therapy

## 2016-05-20 ENCOUNTER — Encounter: Payer: PPO | Admitting: Physical Therapy

## 2016-05-20 ENCOUNTER — Encounter: Payer: Self-pay | Admitting: *Deleted

## 2016-05-20 ENCOUNTER — Other Ambulatory Visit: Payer: Self-pay

## 2016-05-20 MED ORDER — DEXLANSOPRAZOLE 60 MG PO CPDR: 60.0000 mg | DELAYED_RELEASE_CAPSULE | Freq: Every day | ORAL | 6 refills | Status: DC

## 2016-05-21 ENCOUNTER — Encounter
Admission: RE | Disposition: A | Discharge status: Home / Self Care | Payer: Self-pay | Source: Ambulatory Visit | Attending: Gastroenterology

## 2016-05-21 ENCOUNTER — Ambulatory Visit
Admission: RE | Admit: 2016-05-21 | Discharge: 2016-05-21 | Disposition: A | Discharge status: Home / Self Care | Payer: PPO | Source: Ambulatory Visit | Attending: Gastroenterology | Admitting: Gastroenterology

## 2016-05-21 DIAGNOSIS — K224 Dyskinesia of esophagus: Secondary | ICD-10-CM | POA: Insufficient documentation

## 2016-05-21 DIAGNOSIS — R05 Cough: Secondary | ICD-10-CM | POA: Diagnosis not present

## 2016-05-21 DIAGNOSIS — R079 Chest pain, unspecified: Secondary | ICD-10-CM | POA: Diagnosis not present

## 2016-05-21 HISTORY — PX: ESOPHAGEAL MANOMETRY: SHX5429

## 2016-05-21 SURGERY — MANOMETRY, ESOPHAGUS

## 2016-05-21 MED ORDER — LIDOCAINE HCL 2 % EX GEL
CUTANEOUS | Status: AC
  Administered 2016-05-21: 1 via NASAL
  Filled 2016-05-21: qty 5

## 2016-05-21 MED ORDER — BUTAMBEN-TETRACAINE-BENZOCAINE 2-2-14 % EX AERO
INHALATION_SPRAY | CUTANEOUS | Status: AC
  Administered 2016-05-21: 1 via OROMUCOSAL
  Filled 2016-05-21: qty 20

## 2016-05-21 SURGICAL SUPPLY — 2 items
FACESHIELD LNG OPTICON STERILE (SAFETY) IMPLANT
GLOVE BIO SURGEON STRL SZ8 (GLOVE) ×6 IMPLANT

## 2016-05-22 ENCOUNTER — Encounter: Payer: Self-pay | Admitting: Gastroenterology

## 2016-05-23 ENCOUNTER — Encounter: Payer: PPO | Admitting: Physical Therapy

## 2016-05-23 ENCOUNTER — Encounter: Payer: Self-pay | Admitting: Gastroenterology

## 2016-05-27 ENCOUNTER — Encounter: Payer: PPO | Admitting: Physical Therapy

## 2016-05-30 ENCOUNTER — Ambulatory Visit (INDEPENDENT_AMBULATORY_CARE_PROVIDER_SITE_OTHER): Payer: PPO | Admitting: Family Medicine

## 2016-05-30 ENCOUNTER — Encounter: Payer: Self-pay | Admitting: Family Medicine

## 2016-05-30 ENCOUNTER — Encounter: Payer: PPO | Admitting: Physical Therapy

## 2016-05-30 ENCOUNTER — Telehealth: Payer: Self-pay | Admitting: *Deleted

## 2016-05-30 VITALS — BP 110/70 | HR 71 | Temp 99.2°F | Wt 252.8 lb

## 2016-05-30 DIAGNOSIS — K224 Dyskinesia of esophagus: Secondary | ICD-10-CM | POA: Diagnosis not present

## 2016-05-30 DIAGNOSIS — I10 Essential (primary) hypertension: Secondary | ICD-10-CM | POA: Diagnosis not present

## 2016-05-30 DIAGNOSIS — E119 Type 2 diabetes mellitus without complications: Secondary | ICD-10-CM | POA: Diagnosis not present

## 2016-05-30 LAB — COMPREHENSIVE METABOLIC PANEL
ALBUMIN: 3.7 g/dL (ref 3.5–5.2)
ALK PHOS: 87 U/L (ref 39–117)
ALT: 19 U/L (ref 0–35)
AST: 14 U/L (ref 0–37)
BUN: 11 mg/dL (ref 6–23)
CO2: 27 mEq/L (ref 19–32)
Calcium: 9.1 mg/dL (ref 8.4–10.5)
Chloride: 110 mEq/L (ref 96–112)
Creatinine, Ser: 0.82 mg/dL (ref 0.40–1.20)
GFR: 94.62 mL/min (ref >60.00)
Glucose, Bld: 112 mg/dL — ABNORMAL HIGH (ref 70–99)
POTASSIUM: 5.1 meq/L (ref 3.5–5.1)
SODIUM: 141 meq/L (ref 135–145)
TOTAL PROTEIN: 6.6 g/dL (ref 6.0–8.3)
Total Bilirubin: 0.3 mg/dL (ref 0.2–1.2)

## 2016-05-30 LAB — HEMOGLOBIN A1C: HEMOGLOBIN A1C: 5.7 % (ref 4.6–6.5)

## 2016-05-30 MED ORDER — DEXLANSOPRAZOLE 60 MG PO CPDR: 60.0000 mg | DELAYED_RELEASE_CAPSULE | Freq: Every day | ORAL | 6 refills | Status: DC

## 2016-05-30 MED ORDER — PROMETHAZINE HCL 25 MG PO TABS: ORAL_TABLET | ORAL | 2 refills | Status: DC

## 2016-05-30 NOTE — Patient Instructions (Addendum)
Nice to see you. We will get some blood work today and contact you with the results. Please continue to monitor your blood sugars.

## 2016-05-30 NOTE — Telephone Encounter (Signed)
Pt questioned if she should be concerned of her temperature of 99.2 during the visit today, dure to having a history of a knee surgery.  Pt contact [phone]

## 2016-05-30 NOTE — Telephone Encounter (Signed)
Please advise 

## 2016-05-30 NOTE — Telephone Encounter (Signed)
Patient notified

## 2016-05-30 NOTE — Telephone Encounter (Signed)
Patients temperature typically runs in the 98.7-98.8 Fahrenheit range in our office. 99.2 F is not considered a fever. If she starts to feel unwell or feverish or if she has pain in her knee or warmth or swelling in her knee she should be evaluated.

## 2016-05-30 NOTE — Assessment & Plan Note (Signed)
Some elevated blood sugars. We'll check an A1c.

## 2016-05-30 NOTE — Progress Notes (Signed)
  Tommi Rumps, MD Phone: [phone]  Denise Davis is a 51 y.o. female who presents today for follow-up.  Hypertension: Reports she's been checking it and has been similar to today. She's taking amlodipine and losartan. No shortness of breath or edema.  Esophageal spasms: Patient was diagnosed with this previously. Had cardiac workup to rule out cardiac cause with reassuring stress test. She notes intermittently getting an aching sensation at the bottom part of her sternum that does radiate out across her chest at times. She had an EGD and esophageal manometry through GI though has not heard about the results. They started her on dexilant which did take the pain away completely. She has run out of this and some of the pain has come back. She notes no shortness of breath or sweatiness or exertional component with the pain.  She does note she checked her blood sugars on 2 occasions recently and they've been 150. Typically they are running in the 90s to 100s.  PMH: Former smoker   ROS see history of present illness  Objective  Physical Exam Vitals:   05/30/16 0909  BP: 110/70  Pulse: 71  Temp: 99.2 F (37.3 C)    BP Readings from Last 3 Encounters:  05/30/16 110/70  05/14/16 125/75  05/01/16 117/68   Wt Readings from Last 3 Encounters:  05/30/16 252 lb 12.8 oz (114.7 kg)  05/14/16 247 lb (112 kg)  05/01/16 247 lb (112 kg)    Physical Exam  Constitutional: No distress.  Cardiovascular: Normal rate, regular rhythm and normal heart sounds.   Pulmonary/Chest: Effort normal and breath sounds normal.  Abdominal: Soft. Bowel sounds are normal. She exhibits no distension. There is no tenderness. There is no rebound and no guarding.  Musculoskeletal: She exhibits no edema.  Neurological: She is alert. Gait normal.  Skin: She is not diaphoretic.     Assessment/Plan: Please see individual problem list.  Hypertension At goal. Continue current medications.  Diabetes  mellitus ([place]) Some elevated blood sugars. We'll check an A1c.  Esophageal spasm Suspect the patient's chest discomfort is related to esophageal spasms. She had cardiac workup previously that was unremarkable. This resolved with taking dexilant. We will refill this for her. Phenergan for any nausea. Discussed following up with her GI physician on the results of her recent testing. Continue to monitor.   Orders Placed This Encounter  Procedures  . Comp Met (CMET)  . HgB A1c    Meds ordered this encounter  Medications  . dexlansoprazole (DEXILANT) 60 MG capsule    Sig: Take 1 capsule (60 mg total) by mouth daily.    Dispense:  30 capsule    Refill:  6  . promethazine (PHENERGAN) 25 MG tablet    Sig: TAKE ONE TABLET BY MOUTH EVERY 8 HOURS AS NEEDED FOR NAUSEA OR  VOMITING    Dispense:  30 tablet    Refill:  2    Please consider 90 day supplies to promote better adherence    Tommi Rumps, MD [place]

## 2016-05-30 NOTE — Progress Notes (Signed)
Pre visit review using our clinic review tool, if applicable. No additional management support is needed unless otherwise documented below in the visit note. 

## 2016-05-30 NOTE — Assessment & Plan Note (Signed)
At goal. Continue current medications. 

## 2016-05-30 NOTE — Assessment & Plan Note (Addendum)
Suspect the patient's chest discomfort is related to esophageal spasms. She had cardiac workup previously that was unremarkable. This resolved with taking dexilant. We will refill this for her. Phenergan for any nausea. Discussed following up with her GI physician on the results of her recent testing. Continue to monitor.

## 2016-05-30 NOTE — Telephone Encounter (Signed)
Left message to return call 

## 2016-06-11 ENCOUNTER — Telehealth: Payer: Self-pay | Admitting: Family Medicine

## 2016-06-11 NOTE — Telephone Encounter (Signed)
Left pt message asking to call Allison back directly at [phone] to schedule AWV. Thanks! °

## 2016-07-08 DIAGNOSIS — Z96651 Presence of right artificial knee joint: Secondary | ICD-10-CM | POA: Diagnosis not present

## 2016-07-17 DIAGNOSIS — M1712 Unilateral primary osteoarthritis, left knee: Secondary | ICD-10-CM | POA: Diagnosis not present

## 2016-07-17 DIAGNOSIS — M259 Joint disorder, unspecified: Secondary | ICD-10-CM | POA: Diagnosis not present

## 2016-07-17 DIAGNOSIS — Z01818 Encounter for other preprocedural examination: Secondary | ICD-10-CM | POA: Diagnosis not present

## 2016-07-29 NOTE — Telephone Encounter (Signed)
Left pt message asking to call Allison back directly at [phone] to schedule AWV. Thanks! °

## 2016-08-01 DIAGNOSIS — Z9884 Bariatric surgery status: Secondary | ICD-10-CM | POA: Diagnosis not present

## 2016-08-01 DIAGNOSIS — M1712 Unilateral primary osteoarthritis, left knee: Secondary | ICD-10-CM | POA: Diagnosis not present

## 2016-08-01 DIAGNOSIS — Z86718 Personal history of other venous thrombosis and embolism: Secondary | ICD-10-CM | POA: Diagnosis not present

## 2016-08-01 DIAGNOSIS — Z85528 Personal history of other malignant neoplasm of kidney: Secondary | ICD-10-CM | POA: Diagnosis not present

## 2016-08-01 DIAGNOSIS — F411 Generalized anxiety disorder: Secondary | ICD-10-CM | POA: Diagnosis not present

## 2016-08-01 DIAGNOSIS — L299 Pruritus, unspecified: Secondary | ICD-10-CM | POA: Diagnosis not present

## 2016-08-01 DIAGNOSIS — Z905 Acquired absence of kidney: Secondary | ICD-10-CM | POA: Diagnosis not present

## 2016-08-01 DIAGNOSIS — Z96652 Presence of left artificial knee joint: Secondary | ICD-10-CM | POA: Diagnosis not present

## 2016-08-01 DIAGNOSIS — Z6841 Body Mass Index (BMI) 40.0 and over, adult: Secondary | ICD-10-CM | POA: Diagnosis not present

## 2016-08-01 DIAGNOSIS — F329 Major depressive disorder, single episode, unspecified: Secondary | ICD-10-CM | POA: Diagnosis not present

## 2016-08-01 DIAGNOSIS — F4310 Post-traumatic stress disorder, unspecified: Secondary | ICD-10-CM | POA: Diagnosis not present

## 2016-08-01 DIAGNOSIS — G4733 Obstructive sleep apnea (adult) (pediatric): Secondary | ICD-10-CM | POA: Diagnosis not present

## 2016-08-01 DIAGNOSIS — Z7951 Long term (current) use of inhaled steroids: Secondary | ICD-10-CM | POA: Diagnosis not present

## 2016-08-01 DIAGNOSIS — K219 Gastro-esophageal reflux disease without esophagitis: Secondary | ICD-10-CM | POA: Diagnosis not present

## 2016-08-01 DIAGNOSIS — G40909 Epilepsy, unspecified, not intractable, without status epilepticus: Secondary | ICD-10-CM | POA: Diagnosis not present

## 2016-08-01 DIAGNOSIS — Z8669 Personal history of other diseases of the nervous system and sense organs: Secondary | ICD-10-CM | POA: Diagnosis not present

## 2016-08-01 DIAGNOSIS — M25462 Effusion, left knee: Secondary | ICD-10-CM | POA: Diagnosis not present

## 2016-08-01 DIAGNOSIS — Z471 Aftercare following joint replacement surgery: Secondary | ICD-10-CM | POA: Diagnosis not present

## 2016-08-01 DIAGNOSIS — E119 Type 2 diabetes mellitus without complications: Secondary | ICD-10-CM | POA: Diagnosis not present

## 2016-08-01 DIAGNOSIS — J45909 Unspecified asthma, uncomplicated: Secondary | ICD-10-CM | POA: Diagnosis not present

## 2016-08-01 DIAGNOSIS — E785 Hyperlipidemia, unspecified: Secondary | ICD-10-CM | POA: Diagnosis not present

## 2016-08-01 DIAGNOSIS — Z791 Long term (current) use of non-steroidal anti-inflammatories (NSAID): Secondary | ICD-10-CM | POA: Diagnosis not present

## 2016-08-01 DIAGNOSIS — I10 Essential (primary) hypertension: Secondary | ICD-10-CM | POA: Diagnosis not present

## 2016-08-01 DIAGNOSIS — G8929 Other chronic pain: Secondary | ICD-10-CM | POA: Diagnosis not present

## 2016-08-01 DIAGNOSIS — M21062 Valgus deformity, not elsewhere classified, left knee: Secondary | ICD-10-CM | POA: Diagnosis not present

## 2016-08-02 DIAGNOSIS — I10 Essential (primary) hypertension: Secondary | ICD-10-CM | POA: Diagnosis not present

## 2016-08-02 DIAGNOSIS — E785 Hyperlipidemia, unspecified: Secondary | ICD-10-CM | POA: Diagnosis not present

## 2016-08-02 DIAGNOSIS — G8929 Other chronic pain: Secondary | ICD-10-CM | POA: Diagnosis not present

## 2016-08-02 DIAGNOSIS — E119 Type 2 diabetes mellitus without complications: Secondary | ICD-10-CM | POA: Diagnosis not present

## 2016-08-02 DIAGNOSIS — G4733 Obstructive sleep apnea (adult) (pediatric): Secondary | ICD-10-CM | POA: Diagnosis not present

## 2016-08-03 DIAGNOSIS — E785 Hyperlipidemia, unspecified: Secondary | ICD-10-CM | POA: Diagnosis not present

## 2016-08-03 DIAGNOSIS — G8929 Other chronic pain: Secondary | ICD-10-CM | POA: Diagnosis not present

## 2016-08-03 DIAGNOSIS — G4733 Obstructive sleep apnea (adult) (pediatric): Secondary | ICD-10-CM | POA: Diagnosis not present

## 2016-08-03 DIAGNOSIS — E119 Type 2 diabetes mellitus without complications: Secondary | ICD-10-CM | POA: Diagnosis not present

## 2016-08-03 DIAGNOSIS — I10 Essential (primary) hypertension: Secondary | ICD-10-CM | POA: Diagnosis not present

## 2016-08-05 DIAGNOSIS — Z9884 Bariatric surgery status: Secondary | ICD-10-CM | POA: Diagnosis not present

## 2016-08-05 DIAGNOSIS — E119 Type 2 diabetes mellitus without complications: Secondary | ICD-10-CM | POA: Diagnosis not present

## 2016-08-05 DIAGNOSIS — Z96652 Presence of left artificial knee joint: Secondary | ICD-10-CM | POA: Diagnosis not present

## 2016-08-05 DIAGNOSIS — F341 Dysthymic disorder: Secondary | ICD-10-CM | POA: Diagnosis not present

## 2016-08-05 DIAGNOSIS — G4733 Obstructive sleep apnea (adult) (pediatric): Secondary | ICD-10-CM | POA: Diagnosis not present

## 2016-08-05 DIAGNOSIS — Z86718 Personal history of other venous thrombosis and embolism: Secondary | ICD-10-CM | POA: Diagnosis not present

## 2016-08-05 DIAGNOSIS — G40909 Epilepsy, unspecified, not intractable, without status epilepticus: Secondary | ICD-10-CM | POA: Diagnosis not present

## 2016-08-05 DIAGNOSIS — Z85528 Personal history of other malignant neoplasm of kidney: Secondary | ICD-10-CM | POA: Diagnosis not present

## 2016-08-05 DIAGNOSIS — I10 Essential (primary) hypertension: Secondary | ICD-10-CM | POA: Diagnosis not present

## 2016-08-05 DIAGNOSIS — Z471 Aftercare following joint replacement surgery: Secondary | ICD-10-CM | POA: Diagnosis not present

## 2016-08-21 ENCOUNTER — Ambulatory Visit: Payer: PPO | Attending: Orthopedic Surgery | Admitting: Physical Therapy

## 2016-08-21 DIAGNOSIS — M25561 Pain in right knee: Secondary | ICD-10-CM | POA: Insufficient documentation

## 2016-08-21 DIAGNOSIS — M6281 Muscle weakness (generalized): Secondary | ICD-10-CM | POA: Insufficient documentation

## 2016-08-21 DIAGNOSIS — R262 Difficulty in walking, not elsewhere classified: Secondary | ICD-10-CM

## 2016-08-21 DIAGNOSIS — Z96651 Presence of right artificial knee joint: Secondary | ICD-10-CM | POA: Diagnosis not present

## 2016-08-21 NOTE — Therapy (Signed)
[facility] [address] [place], [address] Phone: [phone]   Fax:  [phone]  Physical Therapy Evaluation  Patient Details  Name: Denise Davis MRN: [id number] Date of Birth: September 19, 1965 Referring Provider: Domingo Pulse MD  Encounter Date: 08/21/2016      PT End of Session - 08/21/16 0839    Visit Number 1   Number of Visits 17   Date for PT Re-Evaluation 11/13/16   PT Start Time 0835   PT Stop Time 0930   PT Time Calculation (min) 55 min   Activity Tolerance Patient tolerated treatment well;Patient limited by pain   Behavior During Therapy [facility] for tasks assessed/performed      Past Medical History:  Diagnosis Date  . Allergic rhinitis   . Anxiety   . Depression   . Diabetes mellitus   . DVT of leg (deep venous thrombosis) ([place]) 12/2007   left leg, given warfarin for 6 mos  . GERD (gastroesophageal reflux disease)   . H/O hiatal hernia   . Hyperlipidemia   . Hypertension   . Morbid obesity ([place])   . Pericarditis    Diagnosed/treated at [facility]; per patient 4 episodes per cardiologist Dr. Edwin Dada persistant chest pain unclear if pericarditis verusus neuropsychogenic   . Renal cell cancer ([place]) 2009   S/p nephrectomy (left)  . Seizure disorder ([place])   . Seizures ([place])   . Shortness of breath   . Syncope     Past Surgical History:  Procedure Laterality Date  . ENDOMETRIAL ABLATION  2007  . ESOPHAGEAL MANOMETRY N/A 05/21/2016   Procedure: ESOPHAGEAL MANOMETRY (EM);  Surgeon: Lucilla Lame, MD;  Location: [facility] ENDOSCOPY;  Service: Endoscopy;  Laterality: N/A;  . ESOPHAGOGASTRODUODENOSCOPY (EGD) WITH PROPOFOL N/A 05/14/2016   Procedure: ESOPHAGOGASTRODUODENOSCOPY (EGD) WITH PROPOFOL;  Surgeon: Lucilla Lame, MD;  Location: [facility] ENDOSCOPY;  Service: Endoscopy;  Laterality: N/A;  . INSERTION OF MESH  2014  . LEFT HEART CATHETERIZATION WITH CORONARY ANGIOGRAM N/A 08/30/2013   Procedure: LEFT  HEART CATHETERIZATION WITH CORONARY ANGIOGRAM;  Surgeon: Troy Sine, MD;  Location: [facility] CATH LAB;  Service: Cardiovascular;  Laterality: N/A;  . NEPHRECTOMY    . TUBAL LIGATION    . UMBILICAL HERNIA REPAIR      There were no vitals filed for this visit.       Subjective Assessment - 08/21/16 0840    Subjective Patient reports she had a R TKR in December of 2017 which went quite well. She had her L knee replaced on 08/01/2016 and has had significant pain since that time. She was using a RW prior to this most recent operation, was using a SPC prior to the first surgery.    Limitations Lifting;Walking;Standing;House hold activities   Diagnostic tests X-ray    Patient Stated Goals To be able to ambulate without a device.    Currently in Pain? Yes   Pain Location Knee   Pain Orientation Right   Pain Descriptors / Indicators Aching;Constant   Pain Type Chronic pain;Surgical pain   Pain Onset More than a month ago   Pain Frequency Constant   Aggravating Factors  Increased weightbearing.    Pain Relieving Factors Pain medication.       LEFS - 12/80   ROM 6-106 degrees in supine   62m walk - 37.59 seconds with RW  TUG - 41.39 seconds with RW   Sit to stand - requires heavy use of UEs to  complete transfer safely.   LAQ- patient is barely able to initiate extension against gravity secondary to pain/weakness.    SLR -- able to complete x 10 with min A   Gait analysis - notable for early heel raise on LLE, reduced stance time indicative of decreased tolerance for weightbearing on LLE as well as likely restriction in hip flexor/ankle PF length.  Treatment  High volt stim to 135V to distal VMO/VL and medial and lateral tibial plateau x 15 minutes (completed 3 sets of 10 repetitions of SLRs with min A from PT during.   Performed quad sets with towel roll underneath ankle x 5 to increase knee extension ROM.                          PT Education - 08/21/16 1116     Education provided Yes   Education Details Will focus on pain control first then begin to focus on strengthening.    Person(s) Educated Patient   Methods Explanation;Demonstration;Handout   Comprehension Verbalized understanding;Returned demonstration             PT Long Term Goals - 08/21/16 1252      PT LONG TERM GOAL #1   Title Patient will demonstrate TUG time of less than 15" to demonstrate improved gait speed and balance to demonstrate improved tolerance for mobility.    Time 8   Period Weeks   Status New     PT LONG TERM GOAL #2   Title Patient will report LEFS score of greater than 30/80 to demonstrate improved tolerance for ADLs.    Time 8   Period Weeks   Status New     PT LONG TERM GOAL #3   Title Patient will demonstrate 108m walk time of less than 12 seconds to demonstrate improved gait speed for community mobility.    Time 8   Period Weeks   Status New     PT LONG TERM GOAL #4   Title Patient will transfer sit to stand without use of UEs to demonstrate improved LE strength for functional activities.    Time 8   Period Weeks   Status New               Plan - 08/21/16 1119    Clinical Impression Statement Patient is a 51 y/o female that is roughly 3 weeks s/p L TKR, she has had previous R TKR with good improvements. She has excellent ROM currently, though lacking in extension still. She is largely limited by pain which is markedly impairing her quadriceps function and altering her gait pattern. She reports relief of some pain with modalities and manual therapy applied today. She would benefit from skilled PT services to address her pain and mobility deficits to allow for return to previous level of activity.    Rehab Potential Good   PT Frequency 2x / week   PT Duration 8 weeks   PT Treatment/Interventions Cryotherapy;Electrical Stimulation;Ultrasound;Moist Heat;Gait training;Stair training;Neuromuscular re-education;Taping;Dry needling;Manual  techniques;Therapeutic exercise;Balance training;DME Instruction;Aquatic Therapy   PT Next Visit Plan Progress with quad strengthening using modalities PRN for pain control    PT Home Exercise Plan SLR, quad set with knee in extension to increase knee extension ROM.    Consulted and Agree with Plan of Care Patient      Patient will benefit from skilled therapeutic intervention in order to improve the following deficits and impairments:  Abnormal gait, Decreased balance, Difficulty walking, Decreased  strength, Decreased range of motion, Decreased endurance, Decreased activity tolerance, Decreased mobility, Pain  Visit Diagnosis: Difficulty in walking, not elsewhere classified - Plan: PT plan of care cert/re-cert  Acute pain of right knee - Plan: PT plan of care cert/re-cert  Total knee replacement status, right - Plan: PT plan of care cert/re-cert      G-Codes - 63/14/97 1117    Functional Assessment Tool Used (Outpatient Only) LEFS, patient report, 43m walk, TUG,    Functional Limitation Mobility: Walking and moving around   Mobility: Walking and Moving Around Current Status [phone]) At least 60 percent but less than 80 percent impaired, limited or restricted   Mobility: Walking and Moving Around Goal Status [phone]) At least 1 percent but less than 20 percent impaired, limited or restricted       Problem List Patient Active Problem List   Diagnosis Date Noted  . Esophageal spasm 05/30/2016  . Nausea   . Abdominal pain, epigastric   . Bariatric surgery status   . Other specified diseases of esophagus   . Neuropathy 02/12/2016  . Decreased pedal pulses 02/12/2016  . Allergic rhinitis 02/12/2016  . Rash and nonspecific skin eruption 01/09/2016  . Preop examination 01/09/2016  . Hypokalemia 12/08/2015  . Sleep apnea 12/01/2015  . Palpitations 12/01/2015  . Sore throat 11/02/2015  . Status post gastric bypass for obesity 10/03/2015  . Osteoarthritis 10/03/2015  . Anxiety and  depression 02/13/2015  . Seizure disorder ([place]) 02/13/2015  . GERD (gastroesophageal reflux disease) 02/13/2015  . Chronic respiratory failure ([place]) 08/28/2013  . Syncope 08/25/2013  . Right sided weakness 08/25/2013  . Vocal cord dysfunction 08/18/2012  . Wheezing 08/16/2012  . Shortness of breath 08/16/2012  . Acute respiratory distress 08/16/2012  . Chest pain 05/09/2011  . Diabetes mellitus ([place])   . Hypertension    Royce Macadamia PT, DPT, CSCS     08/21/2016, 12:57 PM  [facility] [address] [place], [address] Phone: [phone]   Fax:  [phone]  Name: Denise Davis MRN: [id number] Date of Birth: [birth year]

## 2016-08-27 ENCOUNTER — Ambulatory Visit: Payer: PPO | Admitting: Physical Therapy

## 2016-08-27 DIAGNOSIS — R262 Difficulty in walking, not elsewhere classified: Secondary | ICD-10-CM | POA: Diagnosis not present

## 2016-08-27 DIAGNOSIS — Z96651 Presence of right artificial knee joint: Secondary | ICD-10-CM

## 2016-08-27 DIAGNOSIS — M25561 Pain in right knee: Secondary | ICD-10-CM

## 2016-08-27 DIAGNOSIS — M6281 Muscle weakness (generalized): Secondary | ICD-10-CM

## 2016-08-27 NOTE — Patient Instructions (Addendum)
ROM flexion to 112 degrees Extension with bolster and overpressure to 0 degrees   SLR x 10 for 3 sets with high volt stim on at 110 V  Manual overpressure with bolster x 5 for 3" holds x 2 sets   Leg extensions against gravity x 12 for 2 sets (stim still on) added 3# x 12 for 2 sets  TRX Sit to stands x 5, x 10, x 10 (well tolerated)   Gait observation

## 2016-08-27 NOTE — Therapy (Signed)
[facility] [address], [place], [address] Phone: [phone]   Fax:  [phone]  Physical Therapy Treatment  Patient Details  Name: Denise Davis MRN: [id number] Date of Birth: 1965/09/16 Referring Provider: Domingo Pulse MD  Encounter Date: 08/27/2016      PT End of Session - 08/27/16 0940    Visit Number 2   Number of Visits 17   Date for PT Re-Evaluation 11/13/16   PT Start Time 0905   PT Stop Time 0944   PT Time Calculation (min) 39 min   Activity Tolerance Patient tolerated treatment well;Patient limited by pain   Behavior During Therapy [facility] for tasks assessed/performed      Past Medical History:  Diagnosis Date  . Allergic rhinitis   . Anxiety   . Depression   . Diabetes mellitus   . DVT of leg (deep venous thrombosis) ([place]) 12/2007   left leg, given warfarin for 6 mos  . GERD (gastroesophageal reflux disease)   . H/O hiatal hernia   . Hyperlipidemia   . Hypertension   . Morbid obesity ([place])   . Pericarditis    Diagnosed/treated at [facility]; per patient 4 episodes per cardiologist Dr. Edwin Dada persistant chest pain unclear if pericarditis verusus neuropsychogenic   . Renal cell cancer ([place]) 2009   S/p nephrectomy (left)  . Seizure disorder ([place])   . Seizures ([place])   . Shortness of breath   . Syncope     Past Surgical History:  Procedure Laterality Date  . ENDOMETRIAL ABLATION  2007  . ESOPHAGEAL MANOMETRY N/A 05/21/2016   Procedure: ESOPHAGEAL MANOMETRY (EM);  Surgeon: Lucilla Lame, MD;  Location: [facility] ENDOSCOPY;  Service: Endoscopy;  Laterality: N/A;  . ESOPHAGOGASTRODUODENOSCOPY (EGD) WITH PROPOFOL N/A 05/14/2016   Procedure: ESOPHAGOGASTRODUODENOSCOPY (EGD) WITH PROPOFOL;  Surgeon: Lucilla Lame, MD;  Location: [facility] ENDOSCOPY;  Service: Endoscopy;  Laterality: N/A;  . INSERTION OF MESH  2014  . LEFT HEART CATHETERIZATION WITH CORONARY ANGIOGRAM N/A 08/30/2013   Procedure: LEFT  HEART CATHETERIZATION WITH CORONARY ANGIOGRAM;  Surgeon: Troy Sine, MD;  Location[facility] CATH LAB;  Service: Cardiovascular;  Laterality: N/A;  . NEPHRECTOMY    . TUBAL LIGATION    . UMBILICAL HERNIA REPAIR      There were no vitals filed for this visit.      Subjective Assessment - 08/27/16 0916    Subjective Patient reports she was in [facility] with her husband over the weekend for cardiac management of his CHF. She has been keeping up with her exercises, but continues to be limited by pain in her L knee which she describes as being much worse than her R.    Limitations Lifting;Walking;Standing;House hold activities   Diagnostic tests X-ray    Patient Stated Goals To be able to ambulate without a device.    Currently in Pain? Yes   Pain Score --  Patient reports moderate to severe pain over the past few days in her L knee.   Pain Location Knee   Pain Orientation Left   Pain Descriptors / Indicators Aching;Constant   Pain Onset More than a month ago   Pain Frequency Constant      ROM flexion to 112 degrees Extension with bolster and overpressure to 0 degrees   SLR x 10 for 3 sets with high volt stim on at 110 V  Soft tissue mobilization provided to medial quadricep/adductors for pain relief/control, patient reports she has  relief of some of the discomfort she was feeling initially in this spot.   Manual overpressure with bolster under ankle x 5 for 3" holds x 2 sets   Leg extensions against gravity x 12 for 2 sets (stim still on) added 3# x 12 for 2 sets  TRX Sit to stands x 5, x 10, x 10 (well tolerated) -- 1 pillow underneath.   Gait observation -- notable for increased and roughly symmetrical stride lengths, she is now achieving full extension in her LLE at heel strike, though reports some discomfort posteriorly and a feeling of weakness in L thigh.                            PT Education - 08/27/16 [id number]    Education provided Yes   Education  Details Continue to focus on pain control, ROM had improved nicely.    Person(s) Educated Patient   Methods Demonstration;Explanation   Comprehension Verbalized understanding;Returned demonstration             PT Long Term Goals - 08/21/16 1252      PT LONG TERM GOAL #1   Title Patient will demonstrate TUG time of less than 15" to demonstrate improved gait speed and balance to demonstrate improved tolerance for mobility.    Time 8   Period Weeks   Status New     PT LONG TERM GOAL #2   Title Patient will report LEFS score of greater than 30/80 to demonstrate improved tolerance for ADLs.    Time 8   Period Weeks   Status New     PT LONG TERM GOAL #3   Title Patient will demonstrate 7m walk time of less than 12 seconds to demonstrate improved gait speed for community mobility.    Time 8   Period Weeks   Status New     PT LONG TERM GOAL #4   Title Patient will transfer sit to stand without use of UEs to demonstrate improved LE strength for functional activities.    Time 8   Period Weeks   Status New               Plan - 08/27/16 0940    Clinical Impression Statement Patient has continued to progress well with ROM, she is still limited by pain for gait training without device currently. Her quadricep strength is improving steadily and she is able to complete TRX sit to stands with a pillow underneath her for multiple bouts and provides better output on LAQs and SLRs.    Rehab Potential Good   PT Frequency 2x / week   PT Duration 8 weeks   PT Treatment/Interventions Cryotherapy;Electrical Stimulation;Ultrasound;Moist Heat;Gait training;Stair training;Neuromuscular re-education;Taping;Dry needling;Manual techniques;Therapeutic exercise;Balance training;DME Instruction;Aquatic Therapy   PT Next Visit Plan Progress with quad strengthening using modalities PRN for pain control    PT Home Exercise Plan SLR, quad set with knee in extension to increase knee extension ROM.     Consulted and Agree with Plan of Care Patient      Patient will benefit from skilled therapeutic intervention in order to improve the following deficits and impairments:  Abnormal gait, Decreased balance, Difficulty walking, Decreased strength, Decreased range of motion, Decreased endurance, Decreased activity tolerance, Decreased mobility, Pain  Visit Diagnosis: Difficulty in walking, not elsewhere classified  Acute pain of right knee  Total knee replacement status, right  Muscle weakness (generalized)     Problem List Patient  Active Problem List   Diagnosis Date Noted  . Esophageal spasm 05/30/2016  . Nausea   . Abdominal pain, epigastric   . Bariatric surgery status   . Other specified diseases of esophagus   . Neuropathy 02/12/2016  . Decreased pedal pulses 02/12/2016  . Allergic rhinitis 02/12/2016  . Rash and nonspecific skin eruption 01/09/2016  . Preop examination 01/09/2016  . Hypokalemia 12/08/2015  . Sleep apnea 12/01/2015  . Palpitations 12/01/2015  . Sore throat 11/02/2015  . Status post gastric bypass for obesity 10/03/2015  . Osteoarthritis 10/03/2015  . Anxiety and depression 02/13/2015  . Seizure disorder ([place]) 02/13/2015  . GERD (gastroesophageal reflux disease) 02/13/2015  . Chronic respiratory failure ([place]) 08/28/2013  . Syncope 08/25/2013  . Right sided weakness 08/25/2013  . Vocal cord dysfunction 08/18/2012  . Wheezing 08/16/2012  . Shortness of breath 08/16/2012  . Acute respiratory distress 08/16/2012  . Chest pain 05/09/2011  . Diabetes mellitus ([place])   . Hypertension    Royce Macadamia PT, DPT, CSCS    08/27/2016, 10:00 AM  [place] [facility] [address], [place], [address] Phone: [phone]   Fax:  [phone]  Name: DONNESHA KARG MRN: [id number] Date of Birth: 1965-08-25

## 2016-08-28 DIAGNOSIS — Z85528 Personal history of other malignant neoplasm of kidney: Secondary | ICD-10-CM | POA: Diagnosis not present

## 2016-08-28 DIAGNOSIS — M79672 Pain in left foot: Secondary | ICD-10-CM | POA: Diagnosis not present

## 2016-08-28 DIAGNOSIS — R0602 Shortness of breath: Secondary | ICD-10-CM | POA: Diagnosis not present

## 2016-08-28 DIAGNOSIS — M79605 Pain in left leg: Secondary | ICD-10-CM | POA: Diagnosis not present

## 2016-08-28 DIAGNOSIS — M7989 Other specified soft tissue disorders: Secondary | ICD-10-CM | POA: Diagnosis not present

## 2016-08-28 DIAGNOSIS — Z5321 Procedure and treatment not carried out due to patient leaving prior to being seen by health care provider: Secondary | ICD-10-CM | POA: Diagnosis not present

## 2016-08-28 DIAGNOSIS — Z96652 Presence of left artificial knee joint: Secondary | ICD-10-CM | POA: Diagnosis not present

## 2016-08-29 ENCOUNTER — Ambulatory Visit: Payer: PPO | Admitting: Physical Therapy

## 2016-09-02 ENCOUNTER — Ambulatory Visit: Payer: PPO | Attending: Orthopedic Surgery | Admitting: Physical Therapy

## 2016-09-02 DIAGNOSIS — M25561 Pain in right knee: Secondary | ICD-10-CM | POA: Insufficient documentation

## 2016-09-02 DIAGNOSIS — R262 Difficulty in walking, not elsewhere classified: Secondary | ICD-10-CM | POA: Insufficient documentation

## 2016-09-04 ENCOUNTER — Ambulatory Visit: Payer: PPO | Admitting: Physical Therapy

## 2016-09-09 ENCOUNTER — Ambulatory Visit: Payer: PPO | Admitting: Physical Therapy

## 2016-09-10 ENCOUNTER — Ambulatory Visit: Payer: PPO | Admitting: Physical Therapy

## 2016-09-11 ENCOUNTER — Ambulatory Visit: Payer: PPO | Admitting: Physical Therapy

## 2016-09-12 ENCOUNTER — Other Ambulatory Visit: Payer: Self-pay | Admitting: Family Medicine

## 2016-09-12 ENCOUNTER — Encounter: Payer: PPO | Admitting: Physical Therapy

## 2016-09-12 ENCOUNTER — Ambulatory Visit: Payer: PPO | Admitting: Physical Therapy

## 2016-09-13 NOTE — Telephone Encounter (Signed)
Last OV 05/30/16 last filled 05/30/16 30 2rf

## 2016-09-16 ENCOUNTER — Ambulatory Visit: Payer: PPO | Admitting: Physical Therapy

## 2016-09-16 ENCOUNTER — Encounter: Payer: PPO | Admitting: Physical Therapy

## 2016-09-16 DIAGNOSIS — M21062 Valgus deformity, not elsewhere classified, left knee: Secondary | ICD-10-CM | POA: Diagnosis not present

## 2016-09-16 DIAGNOSIS — Z96652 Presence of left artificial knee joint: Secondary | ICD-10-CM | POA: Diagnosis not present

## 2016-09-16 DIAGNOSIS — Z471 Aftercare following joint replacement surgery: Secondary | ICD-10-CM | POA: Diagnosis not present

## 2016-09-17 ENCOUNTER — Ambulatory Visit: Payer: PPO

## 2016-09-17 DIAGNOSIS — R262 Difficulty in walking, not elsewhere classified: Secondary | ICD-10-CM | POA: Diagnosis not present

## 2016-09-17 DIAGNOSIS — M25561 Pain in right knee: Secondary | ICD-10-CM | POA: Diagnosis not present

## 2016-09-17 NOTE — Therapy (Signed)
[facility] [address], [place], [address] Phone: [phone]   Fax:  [phone]  Physical Therapy Treatment  Patient Details  Name: SHALIKA ARNTZ MRN: [id number] Date of Birth: 03-13-1966 Referring Provider: Domingo Pulse MD  Encounter Date: 09/17/2016      PT End of Session - 09/17/16 1419    Visit Number 3   Number of Visits 17   Date for PT Re-Evaluation 11/13/16   PT Start Time 9678   PT Stop Time 1455   PT Time Calculation (min) 30 min   Activity Tolerance Patient tolerated treatment well;Patient limited by pain   Behavior During Therapy [facility] for tasks assessed/performed      Past Medical History:  Diagnosis Date  . Allergic rhinitis   . Anxiety   . Depression   . Diabetes mellitus   . DVT of leg (deep venous thrombosis) ([place]) 12/2007   left leg, given warfarin for 6 mos  . GERD (gastroesophageal reflux disease)   . H/O hiatal hernia   . Hyperlipidemia   . Hypertension   . Morbid obesity ([place])   . Pericarditis    Diagnosed/treated at [facility]; per patient 4 episodes per cardiologist Dr. Edwin Dada persistant chest pain unclear if pericarditis verusus neuropsychogenic   . Renal cell cancer ([place]) 2009   S/p nephrectomy (left)  . Seizure disorder ([place])   . Seizures ([place])   . Shortness of breath   . Syncope     Past Surgical History:  Procedure Laterality Date  . ENDOMETRIAL ABLATION  2007  . ESOPHAGEAL MANOMETRY N/A 05/21/2016   Procedure: ESOPHAGEAL MANOMETRY (EM);  Surgeon: Lucilla Lame, MD;  Location: [facility] ENDOSCOPY;  Service: Endoscopy;  Laterality: N/A;  . ESOPHAGOGASTRODUODENOSCOPY (EGD) WITH PROPOFOL N/A 05/14/2016   Procedure: ESOPHAGOGASTRODUODENOSCOPY (EGD) WITH PROPOFOL;  Surgeon: Lucilla Lame, MD;  Location: [facility] ENDOSCOPY;  Service: Endoscopy;  Laterality: N/A;  . INSERTION OF MESH  2014  . LEFT HEART CATHETERIZATION WITH CORONARY ANGIOGRAM N/A 08/30/2013   Procedure: LEFT  HEART CATHETERIZATION WITH CORONARY ANGIOGRAM;  Surgeon: Troy Sine, MD;  Location: [facility] CATH LAB;  Service: Cardiovascular;  Laterality: N/A;  . NEPHRECTOMY    . TUBAL LIGATION    . UMBILICAL HERNIA REPAIR      There were no vitals filed for this visit.      Subjective Assessment - 09/17/16 1419    Subjective Pt reports frustration with having to care for husband due to recent L ankle/foot fracture. He has been in the hospital for an extended time which is why she has been unable to come for therapy. She has been having increased pain in L knee recently. Arrives complaining of L knee pain. She has started using her walker again and is fearful to transition back to her cane. Recent visit to her MD who was pleased with her progress and encouraged her to continue with therapy. He was not concerned about the pain. He took repeat radiographs of her knee which were WNL per patient report.    Limitations Lifting;Walking;Standing;House hold activities   Diagnostic tests X-ray    Patient Stated Goals To be able to ambulate without a device.    Currently in Pain? Yes   Pain Score 6    Pain Location Knee   Pain Orientation Left   Pain Descriptors / Indicators Sore;Aching   Pain Type Chronic pain;Surgical pain   Pain Onset More than a month ago   Pain Frequency  Constant          TREATMENT    Estim IFC to L knee x 8 minutes for pain control;  Ther-ex NuStep L0 x 5 minutes for warm-up during history;  IFC left on knee for following exercises: L quad sets 5s hold 3 x 10; Attempted SLR but pt is too weak and reports excessive pain so discontinued; Hooklying marches 3 x 10 bilateral;  Hooklying isometric clams and hip adduction squeeze 5s hold x 10 each;                          PT Education - 09/17/16 1419    Education provided Yes   Education Details Continue HEP   Person(s) Educated Patient   Methods Explanation   Comprehension Verbalized understanding              PT Long Term Goals - 08/21/16 1252      PT LONG TERM GOAL #1   Title Patient will demonstrate TUG time of less than 15" to demonstrate improved gait speed and balance to demonstrate improved tolerance for mobility.    Time 8   Period Weeks   Status New     PT LONG TERM GOAL #2   Title Patient will report LEFS score of greater than 30/80 to demonstrate improved tolerance for ADLs.    Time 8   Period Weeks   Status New     PT LONG TERM GOAL #3   Title Patient will demonstrate 28m walk time of less than 12 seconds to demonstrate improved gait speed for community mobility.    Time 8   Period Weeks   Status New     PT LONG TERM GOAL #4   Title Patient will transfer sit to stand without use of UEs to demonstrate improved LE strength for functional activities.    Time 8   Period Weeks   Status New               Plan - 09/17/16 1420    Clinical Impression Statement Session was ended early at patient's request since her husband was in the car and she was worried about him. She is very painful and guarded today during session. She is too weak/painful to perform SLR so performed lower level exercises today. Reports complete resolution fo knee pain at the end of session. Pt encouraged to continue HEP and follow-up as scheduled.    History and Personal Factors relevant to plan of care: Patient is currently caring for disabled husband   Clinical Presentation Stable   Clinical Decision Making Low   Rehab Potential Good   PT Frequency 2x / week   PT Duration 8 weeks   PT Treatment/Interventions Cryotherapy;Electrical Stimulation;Ultrasound;Moist Heat;Gait training;Stair training;Neuromuscular re-education;Taping;Dry needling;Manual techniques;Therapeutic exercise;Balance training;DME Instruction;Aquatic Therapy   PT Next Visit Plan Progress with quad strengthening using modalities PRN for pain control    PT Home Exercise Plan SLR, quad set with knee in extension to  increase knee extension ROM.    Consulted and Agree with Plan of Care Patient      Patient will benefit from skilled therapeutic intervention in order to improve the following deficits and impairments:  Abnormal gait, Decreased balance, Difficulty walking, Decreased strength, Decreased range of motion, Decreased endurance, Decreased activity tolerance, Decreased mobility, Pain  Visit Diagnosis: Difficulty in walking, not elsewhere classified  Acute pain of right knee     Problem List Patient Active Problem List  Diagnosis Date Noted  . Esophageal spasm 05/30/2016  . Nausea   . Abdominal pain, epigastric   . Bariatric surgery status   . Other specified diseases of esophagus   . Neuropathy 02/12/2016  . Decreased pedal pulses 02/12/2016  . Allergic rhinitis 02/12/2016  . Rash and nonspecific skin eruption 01/09/2016  . Preop examination 01/09/2016  . Hypokalemia 12/08/2015  . Sleep apnea 12/01/2015  . Palpitations 12/01/2015  . Sore throat 11/02/2015  . Status post gastric bypass for obesity 10/03/2015  . Osteoarthritis 10/03/2015  . Anxiety and depression 02/13/2015  . Seizure disorder ([place]) 02/13/2015  . GERD (gastroesophageal reflux disease) 02/13/2015  . Chronic respiratory failure ([place]) 08/28/2013  . Syncope 08/25/2013  . Right sided weakness 08/25/2013  . Vocal cord dysfunction 08/18/2012  . Wheezing 08/16/2012  . Shortness of breath 08/16/2012  . Acute respiratory distress 08/16/2012  . Chest pain 05/09/2011  . Diabetes mellitus ([place])   . Hypertension    Crymes Grout PT, DPT   Huprich,Jason 09/17/2016, 3:11 PM  [facility] [address], [place], [address] Phone: [phone]   Fax:  [phone]  Name: SAMEEHA ROCKEFELLER MRN: [id number] Date of Birth: 1966/02/28

## 2016-09-19 ENCOUNTER — Ambulatory Visit: Payer: PPO | Admitting: Physical Therapy

## 2016-09-23 ENCOUNTER — Ambulatory Visit: Payer: PPO | Admitting: Physical Therapy

## 2016-09-25 ENCOUNTER — Ambulatory Visit: Payer: PPO | Admitting: Physical Therapy

## 2016-09-26 ENCOUNTER — Encounter: Payer: Self-pay | Admitting: Family Medicine

## 2016-09-26 ENCOUNTER — Ambulatory Visit (INDEPENDENT_AMBULATORY_CARE_PROVIDER_SITE_OTHER): Payer: PPO | Admitting: Family Medicine

## 2016-09-26 VITALS — BP 130/82 | HR 71 | Temp 99.2°F | Wt 248.2 lb

## 2016-09-26 DIAGNOSIS — J069 Acute upper respiratory infection, unspecified: Secondary | ICD-10-CM | POA: Insufficient documentation

## 2016-09-26 DIAGNOSIS — R0789 Other chest pain: Secondary | ICD-10-CM

## 2016-09-26 DIAGNOSIS — F419 Anxiety disorder, unspecified: Secondary | ICD-10-CM | POA: Diagnosis not present

## 2016-09-26 DIAGNOSIS — F329 Major depressive disorder, single episode, unspecified: Secondary | ICD-10-CM

## 2016-09-26 DIAGNOSIS — F32A Depression, unspecified: Secondary | ICD-10-CM

## 2016-09-26 LAB — CBC
HCT: 39.1 % (ref 36.0–46.0)
Hemoglobin: 12.9 g/dL (ref 12.0–15.0)
MCHC: 32.9 g/dL (ref 30.0–36.0)
MCV: 94.8 fl (ref 78.0–100.0)
Platelets: 226 10*3/uL (ref 150.0–400.0)
RBC: 4.12 Mil/uL (ref 3.87–5.11)
RDW: 14.8 % (ref 11.5–15.5)
WBC: 2.9 10*3/uL — ABNORMAL LOW (ref 4.0–10.5)

## 2016-09-26 LAB — COMPREHENSIVE METABOLIC PANEL
ALT: 15 U/L (ref 0–35)
AST: 20 U/L (ref 0–37)
Albumin: 3.9 g/dL (ref 3.5–5.2)
Alkaline Phosphatase: 72 U/L (ref 39–117)
BILIRUBIN TOTAL: 0.3 mg/dL (ref 0.2–1.2)
BUN: 15 mg/dL (ref 6–23)
CALCIUM: 9.1 mg/dL (ref 8.4–10.5)
CHLORIDE: 109 meq/L (ref 96–112)
CO2: 23 meq/L (ref 19–32)
CREATININE: 0.84 mg/dL (ref 0.40–1.20)
GFR: 91.91 mL/min (ref >60.00)
GLUCOSE: 98 mg/dL (ref 70–99)
Potassium: 4.4 mEq/L (ref 3.5–5.1)
SODIUM: 140 meq/L (ref 135–145)
Total Protein: 6.5 g/dL (ref 6.0–8.3)

## 2016-09-26 LAB — SEDIMENTATION RATE: SED RATE: 14 mm/h (ref 0–30)

## 2016-09-26 MED ORDER — MIRTAZAPINE 15 MG PO TABS: 15.0000 mg | ORAL_TABLET | Freq: Every day | ORAL | 0 refills | Status: DC

## 2016-09-26 MED ORDER — MELOXICAM 7.5 MG PO TABS: 7.5000 mg | ORAL_TABLET | Freq: Every day | ORAL | 0 refills | Status: DC

## 2016-09-26 MED ORDER — AMOXICILLIN-POT CLAVULANATE 875-125 MG PO TABS: 1.0000 | ORAL_TABLET | Freq: Two times a day (BID) | ORAL | 0 refills | Status: DC

## 2016-09-26 MED ORDER — DEXLANSOPRAZOLE 60 MG PO CPDR: 60.0000 mg | DELAYED_RELEASE_CAPSULE | Freq: Every day | ORAL | 6 refills | Status: DC

## 2016-09-26 MED ORDER — QUETIAPINE FUMARATE 100 MG PO TABS: 100.0000 mg | ORAL_TABLET | Freq: Every day | ORAL | 0 refills | Status: DC

## 2016-09-26 MED ORDER — GABAPENTIN 300 MG PO CAPS: 2400.0000 mg | ORAL_CAPSULE | Freq: Every day | ORAL | 3 refills | Status: DC

## 2016-09-26 NOTE — Assessment & Plan Note (Addendum)
Patient with atypical chest discomfort only when laying down at night. Patient's shortness of breath could be deconditioning related. Chest discomfort potentially could be related to GI issues such as her reflux or esophageal spasms. Could be related to her upper respiratory issues as well. No abnormalities on auscultatory heart exam or lung exam. Doubt pericarditis given no pleuritic component or friction rub and she has a reassuring EKG. Doubt pulmonary cause given benign exam and vital signs. Unlikely ischemia given reassuring EKG and prior low risk stress test. Normal oxygenation and heart rate and positional nature argue against VTE. Discussed continuing her Dexilant. We'll see if improves with treatment of upper respiratory issues. We'll check a CBC given the shortness of breath that she describes. We'll check a sedimentation rate as well to evaluate for inflammation. The WBC will give some information on this as well. She does possibly have a history of pericarditis in the past though appears she has not responded to prednisone or colchicine when treated in in the past per her last cardiology note this was felt to not be indicative of confirmation of pericarditis. Last cardiology note from [place] was reviewed. If does not improve over the next couple of days with treatment of her upper respiratory issues she'll be reevaluated. She is given return precautions.

## 2016-09-26 NOTE — Progress Notes (Signed)
Denise Rumps, MD Phone: [phone]  CHARMANE Davis is a 51 y.o. female who presents today for follow-up.  Patient notes over the last 2 weeks she's had intermittent significant nasal congestion. Typically only bothers her at night. Notes intermittent fevers up to 102F as well. Last fever was yesterday. Notes the fever started in conjunction with the nasal congestion. She's been using Afrin with some benefit for her congestion. She notes no cough. Does note a little shortness of breath when she is walking at an increased pace. She notes this started after increasing her activity and pace with exercise following her left knee replacement. She notes no sinus congestion. No ear pain. No urinary symptoms. No vomiting or diarrhea though does have some chronic intermittent nausea. No lower extremity swelling. Does note she had her left knee replaced on May 4 though has done well since then. She's not had any swelling or erythema of the knee. Does note it occasionally feels warm at the end of the day. Does note occasional chest tightness and burning sensation at night when she lays down. Reports it feels slightly different than her reflux symptoms. Notes it is an aching sensation. No exertional component. Does not radiate. Not associated with shortness of breath. No diaphoresis. She had a low risk stress test a little less than a year ago. No pleuritic component. She has no chest pain or shortness of breath currently. She does have a recurrent history of chest discomfort. In the past she's been treated with colchicine and prednisone with no benefit. At some point it appears this was felt to possibly be related to pericarditis though based on the last cardiology note it was felt less likely to be related to this given lack of improvement with steroids and colchicine. In the past was felt to be GI related or anxiety related.  She does need a refill on her Seroquel and Remeron. She was following with  psychiatry though now wants to switch psychiatrists. Does note some depression. No SI.  PMH: Former smoker   ROS see history of present illness  Objective  Physical Exam Vitals:   09/26/16 0822  BP: 130/82  Pulse: 71  Temp: 99.2 F (37.3 C)    BP Readings from Last 3 Encounters:  09/26/16 130/82  05/30/16 110/70  05/14/16 125/75   Wt Readings from Last 3 Encounters:  09/26/16 248 lb 3.2 oz (112.6 kg)  05/30/16 252 lb 12.8 oz (114.7 kg)  05/14/16 247 lb (112 kg)    Physical Exam  Constitutional: No distress.  HENT:  Head: Normocephalic and atraumatic.  Mouth/Throat: Oropharynx is clear and moist. No oropharyngeal exudate.  Normal TMs  Eyes: Conjunctivae are normal. Pupils are equal, round, and reactive to light.  Neck: Neck supple.  Cardiovascular: Normal rate, regular rhythm and normal heart sounds.   Pulmonary/Chest: Effort normal and breath sounds normal.  Abdominal: Soft. Bowel sounds are normal. She exhibits no distension. There is no tenderness. There is no rebound and no guarding.  Musculoskeletal: She exhibits no edema.  Bilateral knees with knee replacements, bilateral knees are nontender, there is no erythema, there is no swelling, minimal warmth to the left knee  Lymphadenopathy:    She has no cervical adenopathy.  Neurological: She is alert. Gait normal.  Skin: Skin is warm and dry. She is not diaphoretic.   EKG: Normal sinus rhythm, rate 73, negative precordial T waves that are similar to prior, no ischemic changes  Assessment/Plan: Please see individual problem list.  Upper  respiratory infection Given persistence of nasal congestion and fevers suspect possibly bacterial infection. No other obvious source for her fevers at this time on physical exam. We'll proceed with treatment for sinusitis and if not improving she'll follow up for reevaluation. She's given return precautions.  Anxiety and depression Stable. No SI. Advised that she needs to see  psychiatry for future refills of her Seroquel.  Atypical chest pain Patient with atypical chest discomfort only when laying down at night. Patient's shortness of breath could be deconditioning related. Chest discomfort potentially could be related to GI issues such as her reflux or esophageal spasms. Could be related to her upper respiratory issues as well. No abnormalities on auscultatory heart exam or lung exam. Doubt pericarditis given no pleuritic component or friction rub and she has a reassuring EKG. Doubt pulmonary cause given benign exam and vital signs. Unlikely ischemia given reassuring EKG and prior low risk stress test. Normal oxygenation and heart rate and positional nature argue against VTE. Discussed continuing her Dexilant. We'll see if improves with treatment of upper respiratory issues. We'll check a CBC given the shortness of breath that she describes. We'll check a sedimentation rate as well to evaluate for inflammation. The WBC will give some information on this as well. She does possibly have a history of pericarditis in the past though appears she has not responded to prednisone or colchicine when treated in in the past per her last cardiology note this was felt to not be indicative of confirmation of pericarditis. Last cardiology note from [place] was reviewed. If does not improve over the next couple of days with treatment of her upper respiratory issues she'll be reevaluated. She is given return precautions.   Orders Placed This Encounter  Procedures  . Comp Met (CMET)  . CBC  . Sed Rate (ESR)  . EKG 12-Lead    Meds ordered this encounter  Medications  . diclofenac (VOLTAREN) 75 MG EC tablet  . mirtazapine (REMERON) 15 MG tablet    Sig: Take 1 tablet (15 mg total) by mouth at bedtime.    Dispense:  90 tablet    Refill:  0  . meloxicam (MOBIC) 7.5 MG tablet    Sig: Take 1 tablet (7.5 mg total) by mouth daily.    Dispense:  30 tablet    Refill:  0  . gabapentin  (NEURONTIN) 300 MG capsule    Sig: Take 8 capsules (2,400 mg total) by mouth at bedtime.    Dispense:  240 capsule    Refill:  3  . QUEtiapine (SEROQUEL) 100 MG tablet    Sig: Take 1 tablet (100 mg total) by mouth at bedtime.    Dispense:  90 tablet    Refill:  0  . dexlansoprazole (DEXILANT) 60 MG capsule    Sig: Take 1 capsule (60 mg total) by mouth daily.    Dispense:  30 capsule    Refill:  6  . DISCONTD: QUEtiapine (SEROQUEL) 100 MG tablet    Sig: Take 100 mg by mouth at bedtime.  Marland Kitchen amoxicillin-clavulanate (AUGMENTIN) 875-125 MG tablet    Sig: Take 1 tablet by mouth 2 (two) times daily.    Dispense:  14 tablet    Refill:  0    Denise Rumps, MD [place]

## 2016-09-26 NOTE — Assessment & Plan Note (Addendum)
Given persistence of nasal congestion and fevers suspect possibly bacterial infection. No other obvious source for her fevers at this time on physical exam. We'll proceed with treatment for sinusitis and if not improving she'll follow up for reevaluation. She's given return precautions.

## 2016-09-26 NOTE — Assessment & Plan Note (Signed)
Stable. No SI. Advised that she needs to see psychiatry for future refills of her Seroquel.

## 2016-09-26 NOTE — Patient Instructions (Addendum)
Nice to see you. We will treat you with an antibiotic for possible sinus infection. If your symptoms do not start to improve over the next 2 days please let us know. If you develop persistent chest pain, shortness of breath, cough productive of blood, fevers, any new or changing symptoms please seek medical attention.

## 2016-09-27 ENCOUNTER — Other Ambulatory Visit: Payer: Self-pay | Admitting: Family Medicine

## 2016-09-27 DIAGNOSIS — D72819 Decreased white blood cell count, unspecified: Secondary | ICD-10-CM

## 2016-09-30 ENCOUNTER — Ambulatory Visit: Payer: PPO | Admitting: Physical Therapy

## 2016-10-03 ENCOUNTER — Ambulatory Visit: Payer: PPO | Admitting: Physical Therapy

## 2016-10-07 ENCOUNTER — Ambulatory Visit: Payer: PPO | Admitting: Physical Therapy

## 2016-10-09 ENCOUNTER — Ambulatory Visit: Payer: PPO | Admitting: Physical Therapy

## 2016-10-10 ENCOUNTER — Other Ambulatory Visit: Payer: Self-pay

## 2016-10-14 ENCOUNTER — Ambulatory Visit: Payer: PPO | Admitting: Physical Therapy

## 2016-10-16 ENCOUNTER — Encounter: Payer: PPO | Admitting: Physical Therapy

## 2016-10-21 ENCOUNTER — Ambulatory Visit: Payer: Self-pay

## 2016-10-28 ENCOUNTER — Other Ambulatory Visit: Payer: Self-pay | Admitting: Family Medicine

## 2016-11-09 ENCOUNTER — Other Ambulatory Visit: Payer: Self-pay | Admitting: Family Medicine

## 2016-11-14 ENCOUNTER — Other Ambulatory Visit: Payer: Self-pay | Admitting: Family Medicine

## 2016-11-14 IMAGING — CR DG CHEST 2V
2 series · 2 of 2 positions shown · non-contrast
Comparison: 11/03/14

CLINICAL DATA: Left-sided chest pain for 2 days

EXAM:
CHEST - 2 VIEW

[chest pa]
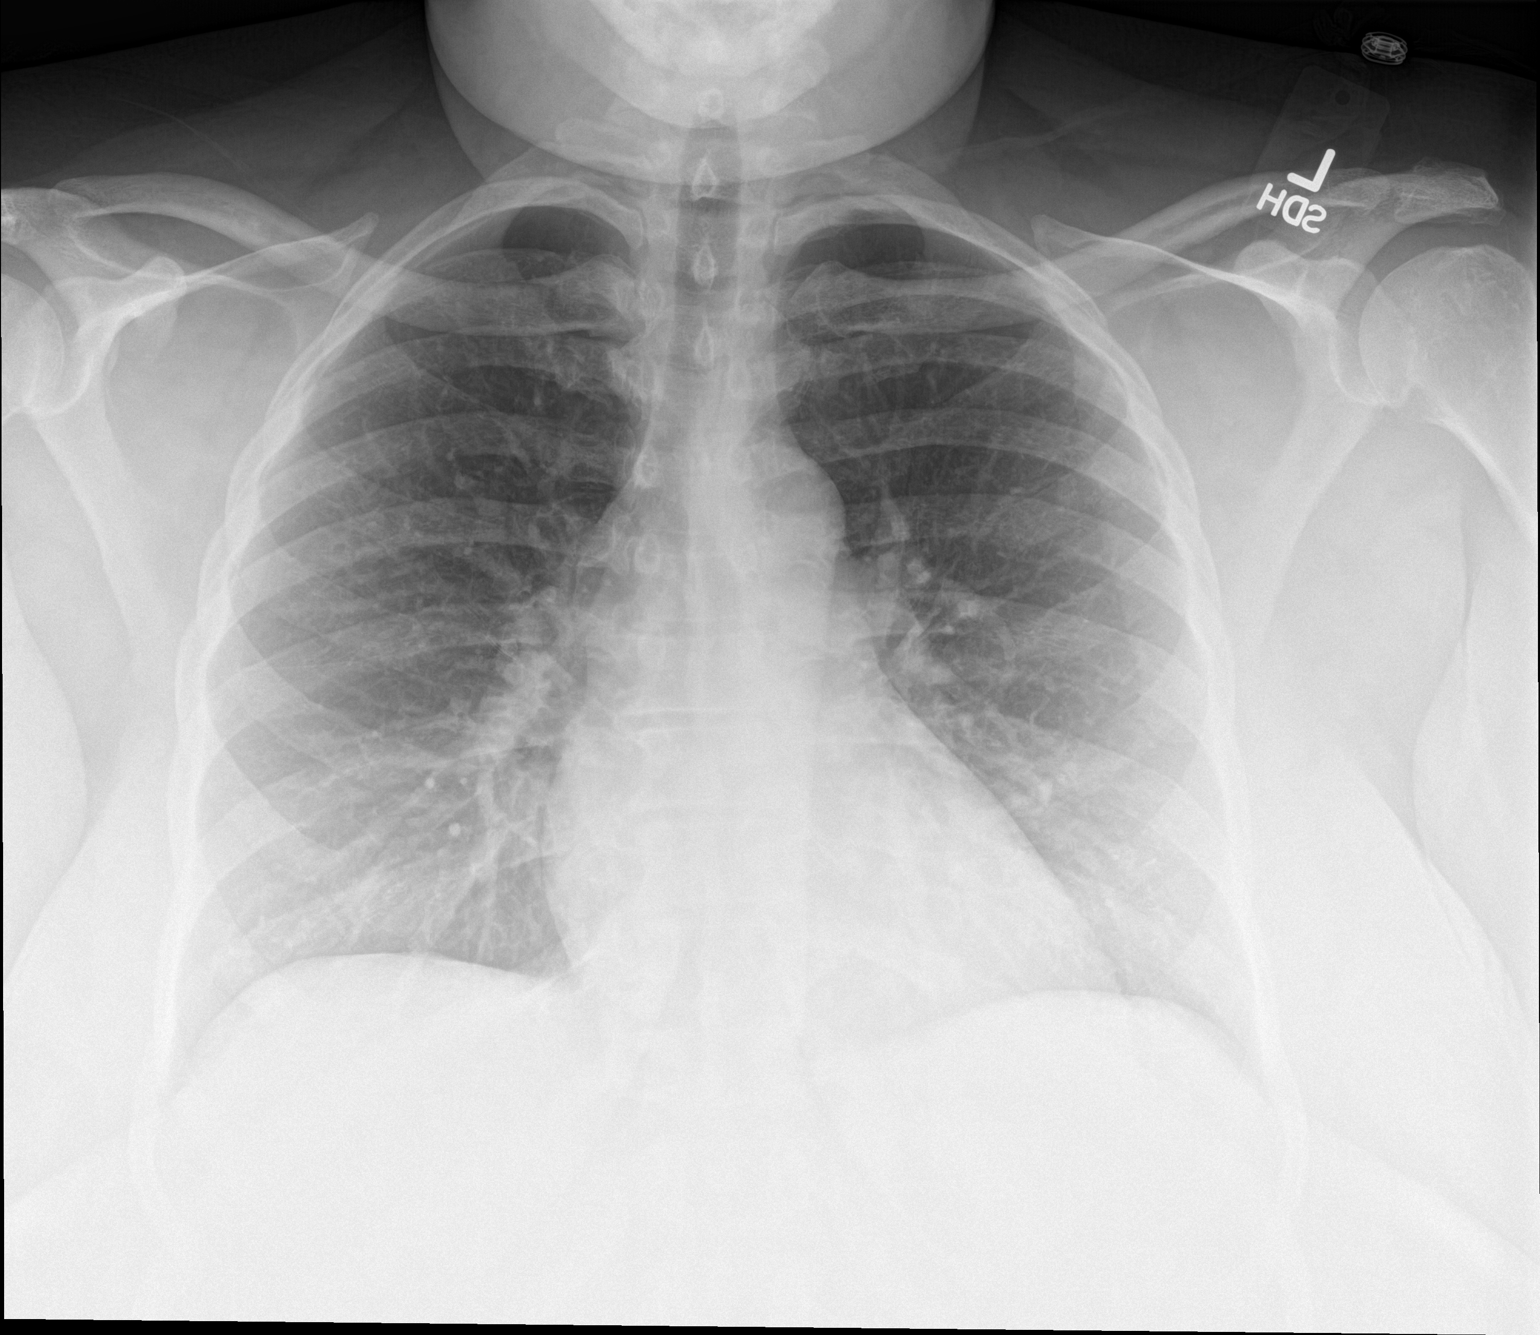

[chest lat]
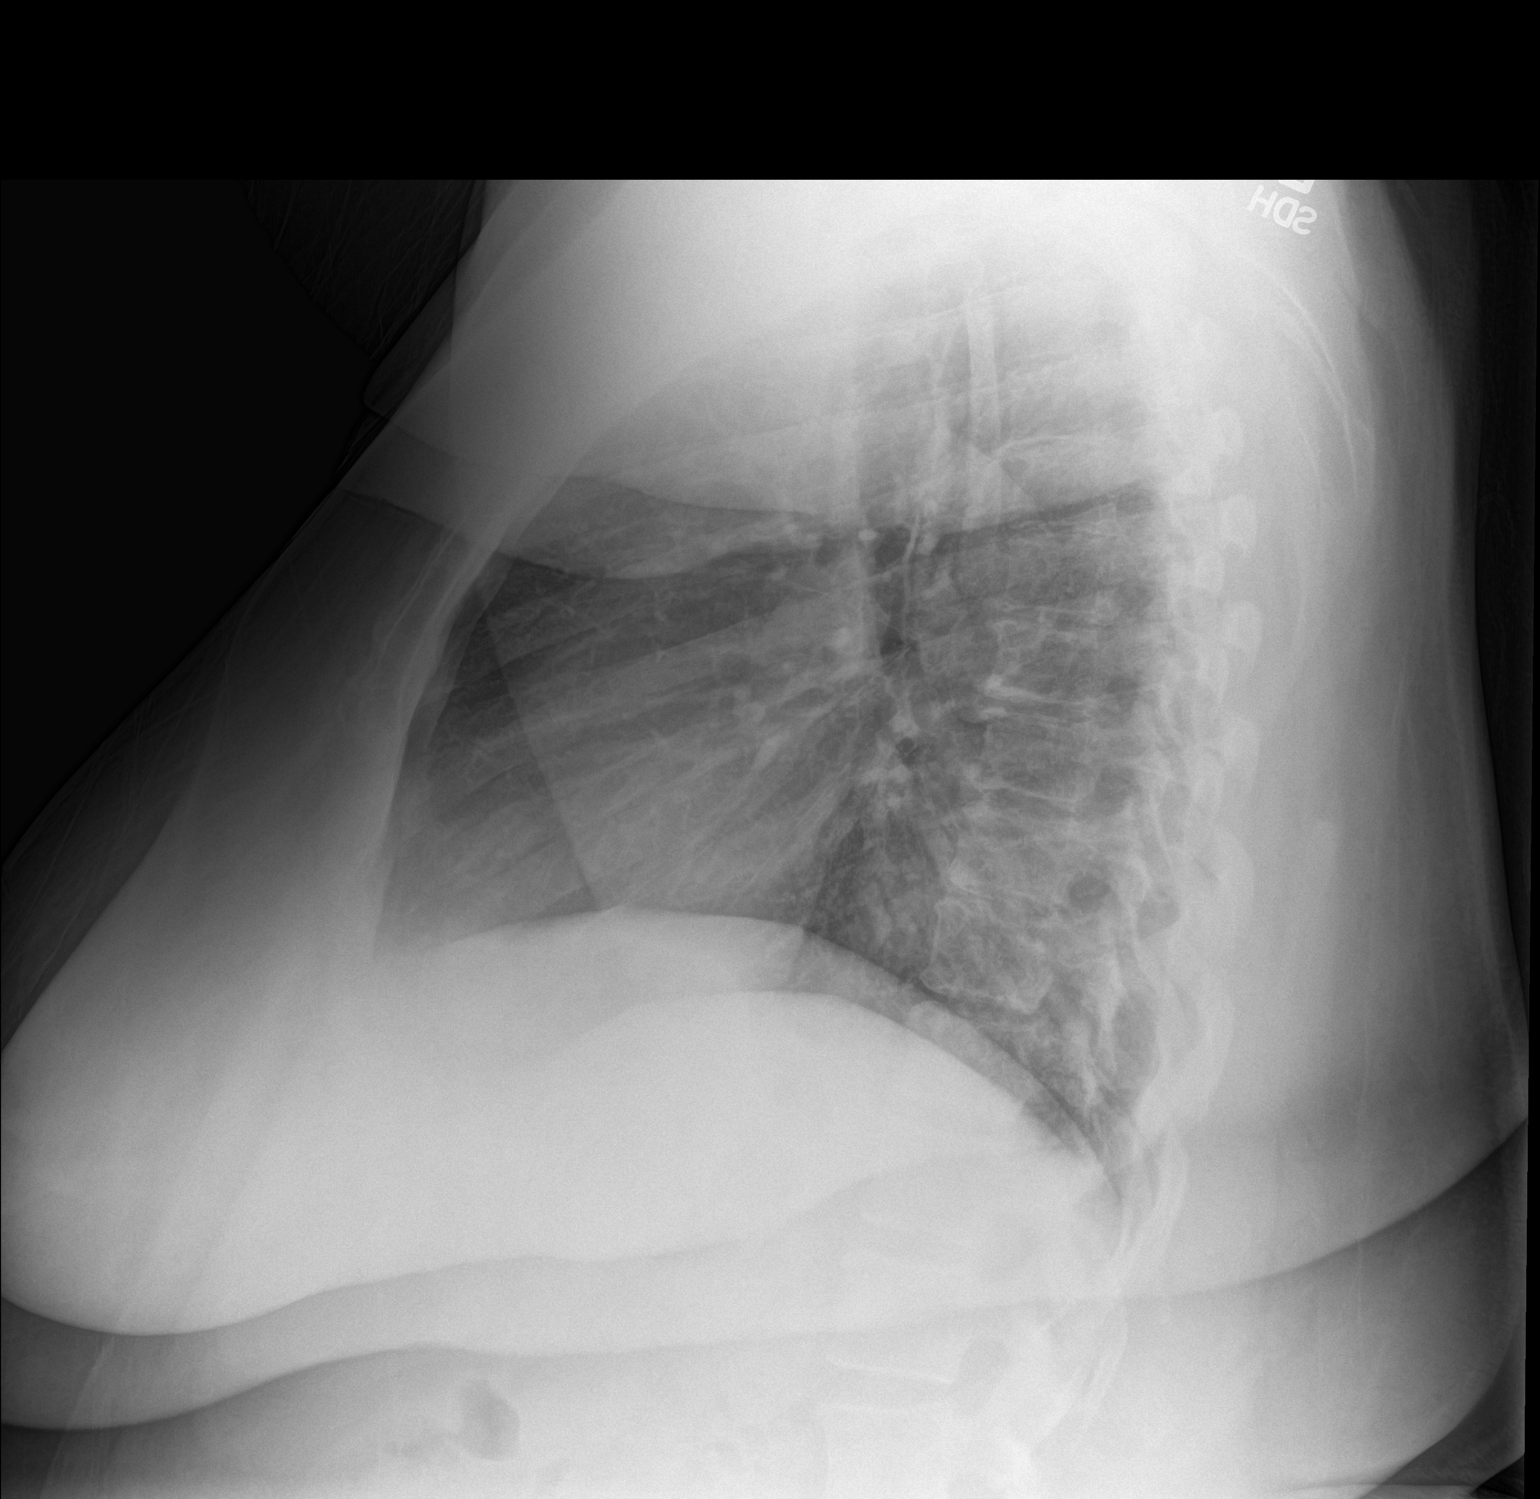

[2 of 2 positions shown; findings below may reference images not displayed]

FINDINGS: The heart size and mediastinal contours are within normal limits.
Both lungs are clear. The visualized skeletal structures are
unremarkable.
IMPRESSION: No active disease.

## 2016-12-06 ENCOUNTER — Ambulatory Visit (INDEPENDENT_AMBULATORY_CARE_PROVIDER_SITE_OTHER): Payer: PPO

## 2016-12-06 VITALS — BP 110/70 | HR 73 | Temp 98.3°F | Resp 16 | Ht 65.0 in | Wt 244.4 lb

## 2016-12-06 DIAGNOSIS — Z0000 Encounter for general adult medical examination without abnormal findings: Secondary | ICD-10-CM | POA: Diagnosis not present

## 2016-12-06 DIAGNOSIS — Z1231 Encounter for screening mammogram for malignant neoplasm of breast: Secondary | ICD-10-CM | POA: Diagnosis not present

## 2016-12-06 DIAGNOSIS — Z1239 Encounter for other screening for malignant neoplasm of breast: Secondary | ICD-10-CM

## 2016-12-06 NOTE — Patient Instructions (Addendum)
  Denise Davis , Thank you for taking time to come for your Medicare Wellness Visit. I appreciate your ongoing commitment to your health goals. Please review the following plan we discussed and let me know if I can assist you in the future.   Schedule follow up with Dr. Caryl Bis.  These are the goals we discussed: Goals    . Increase lean proteins          6 small meals throughout the day    . Increase water intake       This is a list of the screening recommended for you and due dates:  Health Maintenance  Topic Date Due  . Pneumococcal vaccine (1) 09/07/1967  . HIV Screening  09/06/1980  . Tetanus Vaccine  09/06/1984  . Pap Smear  09/07/1986  . Mammogram  09/07/2015  . Hemoglobin A1C  11/30/2016  . Flu Shot  06/29/2017*  . Complete foot exam   02/11/2017  . Eye exam for diabetics  08/14/2017  . Colon Cancer Screening  09/13/2024  *Topic was postponed. The date shown is not the original due date.

## 2016-12-06 NOTE — Progress Notes (Signed)
Subjective:   LONDYNN SONODA is a 51 y.o. female who presents for an Initial Medicare Annual Wellness Visit.  Review of Systems    No ROS.  Medicare Wellness Visit. Additional risk factors are reflected in the social history.  Cardiac Risk Factors include: advanced age (>2men, >64 women);hypertension;diabetes mellitus;obesity (BMI >30kg/m2)     Objective:    Today's Vitals   12/06/16 1407  BP: 110/70  Pulse: 73  Resp: 16  Temp: 98.3 F (36.8 C)  TempSrc: Oral  SpO2: 96%  Weight: 244 lb 6.4 oz (110.9 kg)  Height: 5\' 5"  (1.651 m)   Body mass index is 40.67 kg/m.   Current Medications (verified) Outpatient Encounter Prescriptions as of 12/06/2016  Medication Sig  . amLODipine (NORVASC) 10 MG tablet TAKE ONE TABLET BY MOUTH ONCE DAILY  . amoxicillin-clavulanate (AUGMENTIN) 875-125 MG tablet Take 1 tablet by mouth 2 (two) times daily.  Marland Kitchen aspirin EC 81 MG tablet Take 1 tablet (81 mg total) by mouth daily.  . calcium-vitamin D 250-100 MG-UNIT tablet Take 1 tablet by mouth 2 (two) times daily. Reported on 06/16/2015  . carvedilol (COREG) 25 MG tablet TAKE ONE TABLET BY MOUTH TWICE DAILY WITH MEALS  . Cholecalciferol (VITAMIN D-3) 1000 units CAPS Take 1 capsule (1,000 Units total) by mouth daily. Reported on 06/16/2015  . colchicine 0.6 MG tablet One twice a day  . dexlansoprazole (DEXILANT) 60 MG capsule Take 1 capsule (60 mg total) by mouth daily.  . diclofenac (VOLTAREN) 75 MG EC tablet   . enoxaparin (LOVENOX) 40 MG/0.4ML injection   . gabapentin (NEURONTIN) 300 MG capsule Take 8 capsules (2,400 mg total) by mouth at bedtime.  Marland Kitchen HYDROcodone-acetaminophen (NORCO/VICODIN) 5-325 MG tablet Take 1 tablet by mouth every 6 (six) hours as needed for moderate pain.  Marland Kitchen levETIRAcetam (KEPPRA) 750 MG tablet   . loratadine (CLARITIN) 10 MG tablet Take 1 tablet (10 mg total) by mouth daily.  Marland Kitchen losartan (COZAAR) 100 MG tablet Take 1 tablet (100 mg total) by mouth daily.  . Melatonin 3 MG  TABS Take 3 mg by mouth.  . meloxicam (MOBIC) 7.5 MG tablet Take 1 tablet (7.5 mg total) by mouth daily.  . mirtazapine (REMERON) 15 MG tablet Take 1 tablet (15 mg total) by mouth at bedtime.  . Multiple Vitamins-Minerals (MULTIVITAMIN WITH MINERALS) tablet Take 1 tablet by mouth daily. Reported on 06/16/2015  . nitroGLYCERIN (NITROSTAT) 0.4 MG SL tablet Place 1 tablet (0.4 mg total) under the tongue every 5 (five) minutes as needed for chest pain. (may repeat every 5 minutes but seek medical help if pain persists after 3 tablets)  . omeprazole (PRILOSEC) 40 MG capsule Take 1 capsule (40 mg total) by mouth 2 (two) times daily before a meal.  . ondansetron (ZOFRAN-ODT) 4 MG disintegrating tablet DISSOLVE ONE TABLET IN MOUTH EVERY 8 HOURS AS NEEDED FOR NAUSEA AND VOMITING  . potassium chloride 20 MEQ/15ML (10%) SOLN Take 30 mLs (40 mEq total) by mouth 2 (two) times daily.  . promethazine (PHENERGAN) 25 MG tablet TAKE 1 TABLET BY MOUTH EVERY 8 HOURS AS NEEDED FOR NAUSEA OR VOMITING  . QUEtiapine (SEROQUEL) 100 MG tablet Take 1 tablet (100 mg total) by mouth at bedtime.  . sertraline (ZOLOFT) 100 MG tablet Take 2 tablets (200 mg total) by mouth at bedtime.  . triamcinolone cream (KENALOG) 0.1 % Apply 1 application topically 2 (two) times daily.  . ursodiol (ACTIGALL) 300 MG capsule Take 1 capsule (300 mg total) by  mouth 2 (two) times daily.  . vitamin B-12 (CYANOCOBALAMIN) 500 MCG tablet Take by mouth.  Marland Kitchen atorvastatin (LIPITOR) 10 MG tablet Take 10 mg by mouth.   No facility-administered encounter medications on file as of 12/06/2016.     Allergies (verified) Lisinopril and Milk-related compounds   History: Past Medical History:  Diagnosis Date  . Allergic rhinitis   . Anxiety   . Depression   . Diabetes mellitus   . DVT of leg (deep venous thrombosis) ([place]) 12/2007   left leg, given warfarin for 6 mos  . GERD (gastroesophageal reflux disease)   . H/O hiatal hernia   . Hyperlipidemia   .  Hypertension   . Morbid obesity ([place])   . Pericarditis    Diagnosed/treated at [facility]; per patient 4 episodes per cardiologist Dr. Edwin Dada persistant chest pain unclear if pericarditis verusus neuropsychogenic   . Renal cell cancer ([place]) 2009   S/p nephrectomy (left)  . Seizure disorder ([place])   . Seizures ([place])   . Shortness of breath   . Syncope    Past Surgical History:  Procedure Laterality Date  . ENDOMETRIAL ABLATION  2007  . ESOPHAGEAL MANOMETRY N/A 05/21/2016   Procedure: ESOPHAGEAL MANOMETRY (EM);  Surgeon: Lucilla Lame, MD;  Location: [facility] ENDOSCOPY;  Service: Endoscopy;  Laterality: N/A;  . ESOPHAGOGASTRODUODENOSCOPY (EGD) WITH PROPOFOL N/A 05/14/2016   Procedure: ESOPHAGOGASTRODUODENOSCOPY (EGD) WITH PROPOFOL;  Surgeon: Lucilla Lame, MD;  Location: [facility] ENDOSCOPY;  Service: Endoscopy;  Laterality: N/A;  . INSERTION OF MESH  2014  . LEFT HEART CATHETERIZATION WITH CORONARY ANGIOGRAM N/A 08/30/2013   Procedure: LEFT HEART CATHETERIZATION WITH CORONARY ANGIOGRAM;  Surgeon: Troy Sine, MD;  Location: [facility] CATH LAB;  Service: Cardiovascular;  Laterality: N/A;  . NEPHRECTOMY    . TUBAL LIGATION    . UMBILICAL HERNIA REPAIR     Family History  Problem Relation Age of Onset  . Hypertension Mother   . Breast cancer Mother   . Arthritis Mother   . Heart disease Paternal Grandmother   . Alcoholism Father   . Hyperlipidemia Unknown        Parent  . Mental illness Unknown        Parent  . Diabetes Unknown        Parent   Social History   Occupational History  . Not on file.   Social History Main Topics  . Smoking status: Former Research scientist (life sciences)  . Smokeless tobacco: Never Used  . Alcohol use No  . Drug use: No  . Sexual activity: Not Currently    Tobacco Counseling Counseling given: Not Answered   Activities of Daily Living In your present state of health, do you have any difficulty performing the following activities: 12/06/2016  Hearing? N  Vision? N  Difficulty  concentrating or making decisions? Y  Walking or climbing stairs? Y  Comment L knee pain, intermittent   Dressing or bathing? N  Doing errands, shopping? N  Preparing Food and eating ? N  Using the Toilet? N  In the past six months, have you accidently leaked urine? N  Do you have problems with loss of bowel control? N  Managing your Medications? N  Managing your Finances? N  Housekeeping or managing your Housekeeping? N  Some recent data might be hidden    Immunizations and Health Maintenance Immunization History  Administered Date(s) Administered  . Influenza,inj,Quad PF,6+ Mos 11/22/2015   Health Maintenance Due  Topic Date Due  . PNEUMOCOCCAL POLYSACCHARIDE VACCINE (1) 09/07/1967  .  HIV Screening  09/06/1980  . TETANUS/TDAP  09/06/1984  . PAP SMEAR  09/07/1986  . MAMMOGRAM  09/07/2015  . HEMOGLOBIN A1C  11/30/2016    Patient Care Team: Leone Haven, MD as PCP - General (Family Medicine) [place], Eppie Gibson, RN as [place] any recent [place] you may have received from other than [facility] providers in the past year (date may be approximate).     Assessment:   This is a routine wellness examination for Rhena. The goal of the wellness visit is to assist the patient how to close the gaps in care and create a preventative care plan for the patient.   The roster of all physicians providing medical care to patient is listed in the Snapshot section of the chart.  Taking calcium VIT D as appropriate/Osteoporosis risk reviewed.    Safety issues reviewed; Smoke and carbon monoxide detectors in the home. No firearms in the home.  Wears seatbelts when driving or riding with others. Patient does wear sunscreen or protective clothing when in direct sunlight. No violence in the home.  Patient is alert, normal appearance, oriented to person/place/and time. MMSE not completed today per patient request.  Reports she has difficulty  focusing right now due to the recent death of her husband.  No new identified risk were noted.  No failures at ADL's or IADL's.  BMI- discussed the importance of a healthy diet, water intake and the benefits of aerobic exercise. Educational material provided.   24 hour diet recall: Breakfast: english muffin, egg, sausage Lunch: none Dinner: 1 slice toast with peanut butter States she has a decrease in appetite and is unable to eat full meals.  Encouraged a high protein diet in smaller servings throughout the day.  Educational material provided.    Daily fluid intake: 1 cups of caffeine, sugar free cranberry juice. Encouraged increase in water intake.  Eye- Visual acuity not assessed per patient preference since they have regular follow up with the ophthalmologist.  Wears corrective lenses.  Sleep patterns- Sleeps 4-5 hours at night. CPAP not in use.  Mammogram ordered, follow as directed.  Educational material provided.  Patient Concerns:   Hearing/Vision screen Hearing Screening Comments: Patient is able to hear conversational tones without difficulty.  No issues reported.   Vision Screening Comments: Followed by  Dr. Gloriann Loan  Wears corrective lenses No retnopathy reported Last OV 2018 Visual acuity not assessed per patient preference since they have regular follow up with the ophthalmologist  Dietary issues and exercise activities discussed: Current Exercise Habits: The patient does not participate in regular exercise at present  Goals    . Increase lean proteins          6 small meals throughout the day    . Increase water intake      Depression Screen PHQ 2/9 Scores 12/06/2016 10/03/2015 09/27/2015 06/16/2015 06/06/2015  PHQ - 2 Score 2 5 2 2 4   PHQ- 9 Score 10 17 8  - 18    Fall Risk Fall Risk  12/06/2016 11/28/2015 09/27/2015 06/16/2015 06/06/2015  Falls in the past year? No (No Data) Yes Yes Yes  Comment - No new falls reported today - - -  Number falls in past yr: - - 2 or  more 2 or more 2 or more  Comment - - - within month 4 times  Patient states she has had 6 falls.  Injury with Fall? - - No Yes Yes  Comment - - -  sore  -  Risk Factor Category  - - High Fall Risk - High Fall Risk  Risk for fall due to : - - Impaired balance/gait;History of fall(s) Impaired balance/gait;History of fall(s) History of fall(s)  Follow up - - Education provided;Falls prevention discussed Falls prevention discussed Education provided;Falls prevention discussed  Comment - - - uses walker at all times  -    Cognitive Function: MMSE - Mini Mental State Exam 12/06/2016  Not completed: Unable to complete        Screening Tests Health Maintenance  Topic Date Due  . PNEUMOCOCCAL POLYSACCHARIDE VACCINE (1) 09/07/1967  . HIV Screening  09/06/1980  . TETANUS/TDAP  09/06/1984  . PAP SMEAR  09/07/1986  . MAMMOGRAM  09/07/2015  . HEMOGLOBIN A1C  11/30/2016  . INFLUENZA VACCINE  06/29/2017 (Originally 10/30/2016)  . FOOT EXAM  02/11/2017  . OPHTHALMOLOGY EXAM  08/14/2017  . COLONOSCOPY  09/13/2024      Plan:   End of life planning; Advanced aging; Advanced directives discussed.  No HCPOA/Living Will.  Additional information provided to help them start the conversation with family.  Copy of HCPOA/Living Will requested upon completion. Time spent on this topic is 25 minutes.  I have personally reviewed and noted the following in the patient's chart:   . Medical and social history . Use of alcohol, tobacco or illicit drugs  . Current medications and supplements . Functional ability and status . Nutritional status . Physical activity . Advanced directives . List of other physicians . Hospitalizations, surgeries, and ER visits in previous 12 months . Vitals . Screenings to include cognitive, depression, and falls . Referrals and appointments  In addition, I have reviewed and discussed with patient certain preventive protocols, quality metrics, and best practice  recommendations. A written personalized care plan for preventive services as well as general preventive health recommendations were provided to patient.     Varney Biles, LPN   09/06/5914

## 2016-12-08 NOTE — Progress Notes (Signed)
Care was provided under my supervision. I agree with the management as indicated in the note.  Mikiah Durall DO  

## 2016-12-17 ENCOUNTER — Other Ambulatory Visit: Payer: Self-pay | Admitting: Family Medicine

## 2016-12-18 ENCOUNTER — Ambulatory Visit (INDEPENDENT_AMBULATORY_CARE_PROVIDER_SITE_OTHER): Payer: PPO

## 2016-12-18 ENCOUNTER — Encounter: Payer: Self-pay | Admitting: Family Medicine

## 2016-12-18 ENCOUNTER — Ambulatory Visit (INDEPENDENT_AMBULATORY_CARE_PROVIDER_SITE_OTHER): Payer: PPO | Admitting: Family Medicine

## 2016-12-18 VITALS — BP 135/85 | HR 78 | Temp 99.1°F | Wt 243.0 lb

## 2016-12-18 DIAGNOSIS — R0602 Shortness of breath: Secondary | ICD-10-CM

## 2016-12-18 DIAGNOSIS — R0789 Other chest pain: Secondary | ICD-10-CM | POA: Diagnosis not present

## 2016-12-18 NOTE — Progress Notes (Signed)
Subjective:  Patient ID: Denise Davis, female    DOB: 1966-01-30  Age: 51 y.o. MRN: [id number]  CC: SOB  HPI:  51 year old female with an extensive past medical history including DM 2, seizure disorder, hypertension, sleep apnea presents with the above complaint.  Patient states that the past 2 weeks she's had shortness of breath. No known inciting factor. She states that it does not matter whether it's at rest or with exertion. She reports associated "crackling" in her chest. Wheezing as well. She reports that she's had intermittent fever. She had a fever last night at 101.2. No reports of cough. No known relieving factors. No medications or interventions tried. No other associated symptoms. No other complaints or concerns at this time.  Of note, patient is concerned about PE given her prior history of DVT.  Social Hx   Social History   Social History  . Marital status: Married    Spouse name: N/A  . Number of children: N/A  . Years of education: N/A   Social History Main Topics  . Smoking status: Former Research scientist (life sciences)  . Smokeless tobacco: Never Used  . Alcohol use No  . Drug use: No  . Sexual activity: Not Currently   Other Topics Concern  . 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 Negative for chills.  Respiratory: Positive for shortness of breath and wheezing. Negative for cough.    Objective:  BP 135/85 (BP Location: Right Arm, Patient Position: Sitting, Cuff Size: Normal)   Pulse 78   Temp 99.1 F (37.3 C) (Oral)   Wt 243 lb (110.2 kg)   SpO2 99%   BMI 40.44 kg/m   BP/Weight 12/18/2016 12/06/2016 0/12/9321  Systolic BP 557 322 025  Diastolic BP 85 70 82  Wt. (Lbs) 243 244.4 248.2  BMI 40.44 40.67 41.3    Physical Exam  Constitutional: She is oriented to  person, place, and time. She appears well-developed. No distress.  HENT:  Head: Normocephalic and atraumatic.  Normal TM's bilaterally.  Cardiovascular: Normal rate and regular rhythm.   Pulmonary/Chest: Effort normal. No respiratory distress. She has no wheezes. She has no rales.  Neurological: She is alert and oriented to person, place, and time.  Psychiatric: She has a normal mood and affect.  Vitals reviewed.   Lab Results  Component Value Date   WBC 2.9 (L) 09/26/2016   HGB 12.9 09/26/2016   HCT 39.1 09/26/2016   PLT 226.0 09/26/2016   GLUCOSE 98 09/26/2016   CHOL 260 (H) 08/30/2013   TRIG 260 (H) 08/30/2013   HDL 41 08/30/2013   LDLCALC 167 (H) 08/30/2013   ALT 15 09/26/2016   AST 20 09/26/2016   NA 140 09/26/2016   K 4.4 09/26/2016   CL 109 09/26/2016   CREATININE 0.84 09/26/2016   BUN 15 09/26/2016   CO2 23 09/26/2016   TSH 1.052 12/02/2015   INR 1.02 08/29/2013   HGBA1C 5.7 05/30/2016    Assessment & Plan:   Problem List Items Addressed This Visit    SOB (shortness of breath) - Primary    New acute problem. Uncertain etiology/prognosis at this time. Given fever and duration of symptoms, arranging xray for further evaluation. D dimer as well given  history of DVT (patient concerned).      Relevant Orders   DG Chest 2 View   D-dimer, quantitative (not at [facility])     Follow-up: Pending work up  Breathitt O'Donnell

## 2016-12-18 NOTE — Patient Instructions (Signed)
We will call with the xray results.  Take care  Dr. Lacinda Axon

## 2016-12-18 NOTE — Assessment & Plan Note (Signed)
New acute problem. Uncertain etiology/prognosis at this time. Given fever and duration of symptoms, arranging xray for further evaluation. D dimer as well given history of DVT (patient concerned).

## 2016-12-18 NOTE — Addendum Note (Signed)
Addended by: Arby Barrette on: 12/18/2016 10:10 AM   Modules accepted: Orders

## 2016-12-19 ENCOUNTER — Telehealth: Payer: Self-pay

## 2016-12-19 ENCOUNTER — Other Ambulatory Visit (INDEPENDENT_AMBULATORY_CARE_PROVIDER_SITE_OTHER): Payer: PPO

## 2016-12-19 DIAGNOSIS — R0602 Shortness of breath: Secondary | ICD-10-CM

## 2016-12-19 DIAGNOSIS — R0600 Dyspnea, unspecified: Secondary | ICD-10-CM

## 2016-12-19 DIAGNOSIS — R7989 Other specified abnormal findings of blood chemistry: Secondary | ICD-10-CM

## 2016-12-19 NOTE — Telephone Encounter (Addendum)
Patient came in today and requested to speak to nurse. Triaged patient and she stated no new symptoms from yesterday when she saw Dr. Lacinda Axon. She said that she just doesn't feel well and thinks it could have something to do with stress. Patients husband passed away about a month ago. Patient denied wanting to hurt herself or others. Gave her the number for behavioral medicine at [place]. Patient stated she would like to talk to someone. PCP is aware and said we would f/u with patient once labs have been resulted.    Vital Signs: BP 116/70 R 18 P 86 T 98.8 O2 98%

## 2016-12-20 ENCOUNTER — Encounter: Payer: Self-pay | Admitting: Family Medicine

## 2016-12-20 ENCOUNTER — Telehealth: Payer: Self-pay

## 2016-12-20 ENCOUNTER — Ambulatory Visit
Admission: RE | Admit: 2016-12-20 | Discharge: 2016-12-20 | Disposition: A | Discharge status: Home / Self Care | Payer: PPO | Source: Ambulatory Visit | Attending: Family Medicine | Admitting: Family Medicine

## 2016-12-20 DIAGNOSIS — R0600 Dyspnea, unspecified: Secondary | ICD-10-CM | POA: Insufficient documentation

## 2016-12-20 DIAGNOSIS — R7989 Other specified abnormal findings of blood chemistry: Secondary | ICD-10-CM | POA: Diagnosis not present

## 2016-12-20 LAB — POCT I-STAT CREATININE: CREATININE: 0.7 mg/dL (ref 0.44–1.00)

## 2016-12-20 LAB — D-DIMER, QUANTITATIVE (NOT AT ARMC): D DIMER QUANT: 0.79 ug{FEU}/mL — AB (ref <0.50)

## 2016-12-20 MED ORDER — IOPAMIDOL (ISOVUE-370) INJECTION 76%
75.0000 mL | Freq: Once | INTRAVENOUS | Status: AC | PRN
  Administered 2016-12-20: 75 mL via INTRAVENOUS

## 2016-12-20 NOTE — Telephone Encounter (Signed)
Pt is scheduled for CTA this morning. They are working her in. They will call Dr.Sonnenberg with the results.

## 2016-12-20 NOTE — Telephone Encounter (Signed)
fyi

## 2016-12-20 NOTE — Telephone Encounter (Signed)
Noted. Please let the patient know her d-dimer was elevated. She will need a CTA of her chest to make sure there is not a blood clot. I will place an order. Please let Melissa know this needs to be scheduled today. Patient may need to complete a urine pregnancy test prior to the scan as well if there is possibility of pregnancy.

## 2016-12-20 NOTE — Telephone Encounter (Signed)
Patient notified and states there is no chance she is pregnant

## 2016-12-20 NOTE — Telephone Encounter (Signed)
Referrals placed 

## 2016-12-20 NOTE — Addendum Note (Signed)
Addended by: Leone Haven on: 12/20/2016 06:12 PM   Modules accepted: Orders

## 2016-12-20 NOTE — Telephone Encounter (Signed)
Patient notified and would like a referral to both

## 2016-12-20 NOTE — Telephone Encounter (Signed)
[facility] called. CT was negative for clots. I gave the okay for nurse to give results while patient was there and her go.

## 2016-12-20 NOTE — Telephone Encounter (Signed)
Please let the patient know that her CT scan did not reveal any blood clots. It sounds as though her symptoms based on yesterday's note could be anxiety related though we could also have her see pulmonology or cardiology to determine if there is any lung or cardiac cause for her breathing issues.

## 2016-12-29 IMAGING — CR DG CHEST 1V
1 series · 1 of 1 positions shown · non-contrast
Comparison: Frontal and lateral views 12/29/2014

CLINICAL DATA: Chest pain and shortness of breath for 2 days.

EXAM:
CHEST 1 VIEW

[portable]
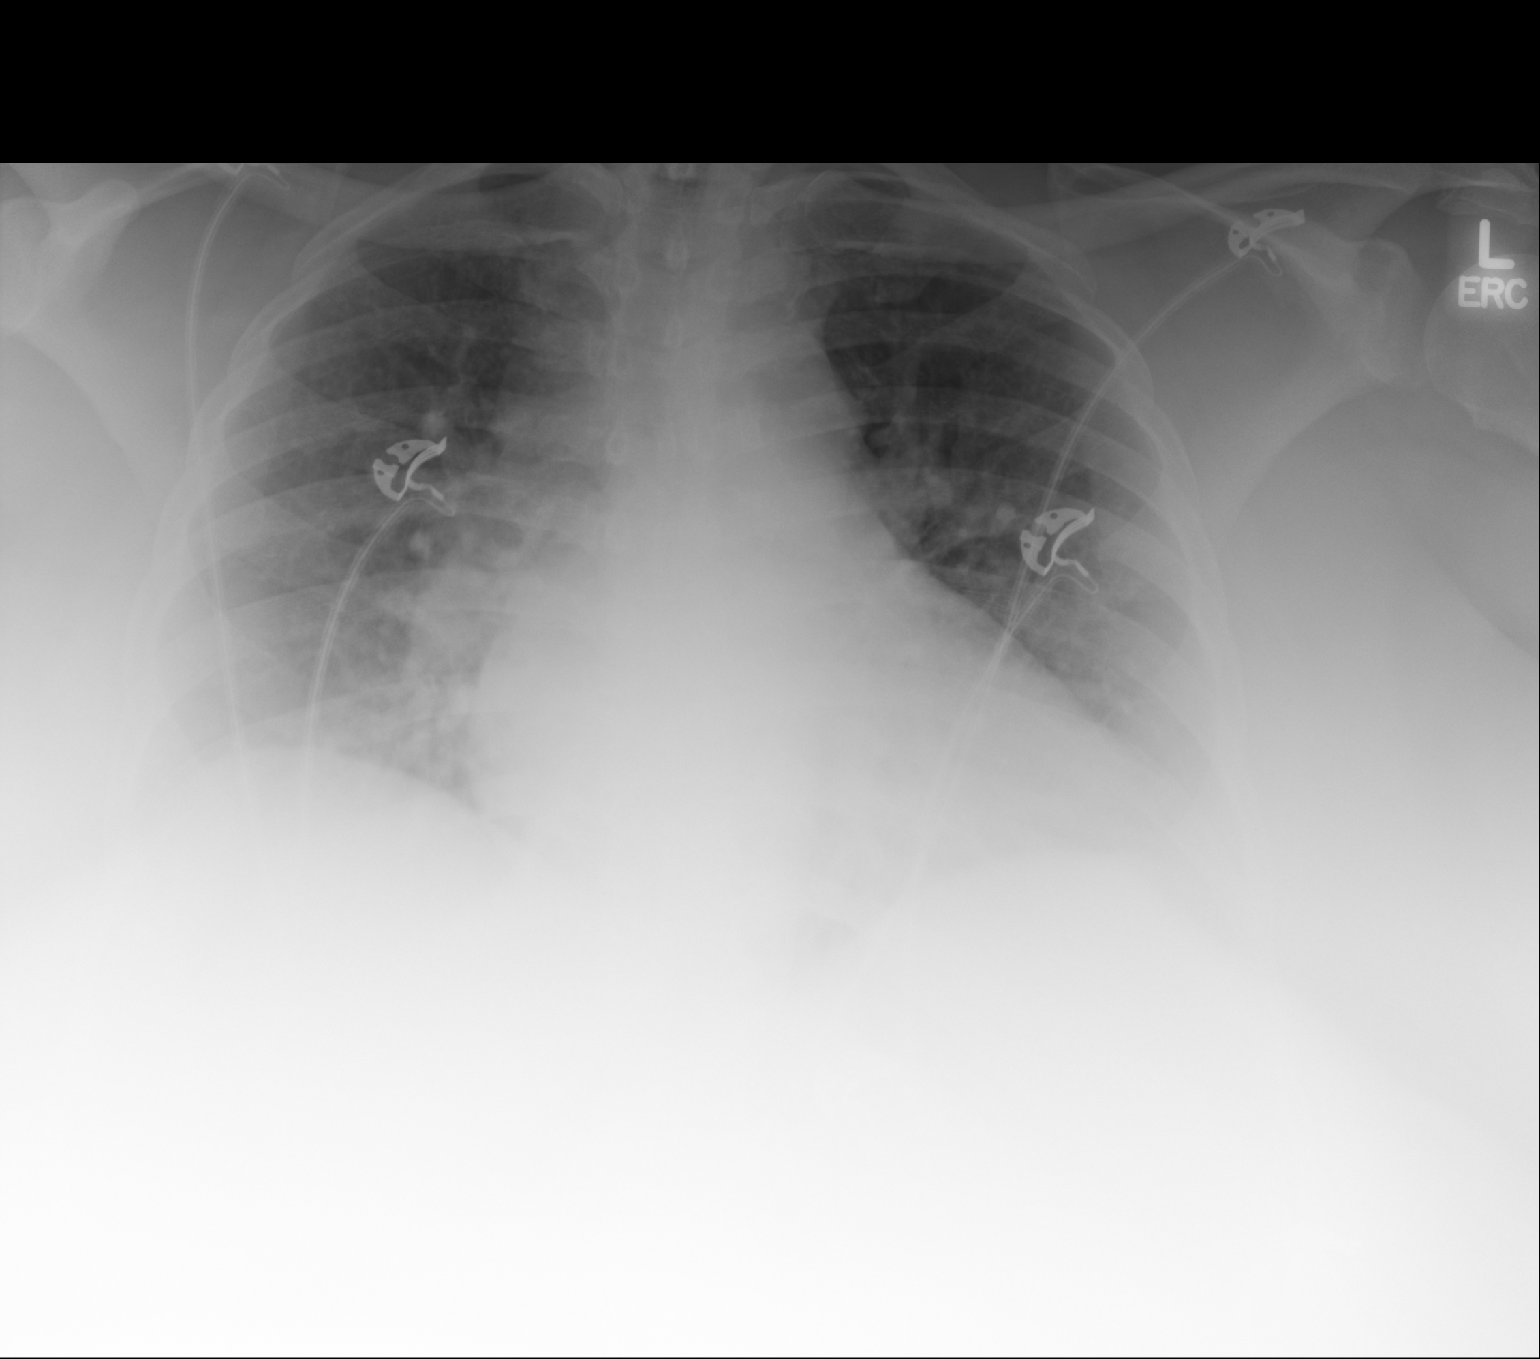

[1 of 1 positions shown; findings below may reference images not displayed]

FINDINGS: Lower lung volumes from prior exam leading to crowding of
bronchovascular structures. Possible vascular congestion versus
differences in technique. Limited basilar assessment due to soft
tissue attenuation from body habitus. The cardiomediastinal contours
are unchanged. No evident focal consolidation, pleural effusion or
pneumothorax.
IMPRESSION: Hypoventilatory chest. Vascular congestion versus secondary to
technique and low lung volumes.

## 2016-12-31 ENCOUNTER — Ambulatory Visit
Admission: RE | Admit: 2016-12-31 | Discharge: 2016-12-31 | Disposition: A | Discharge status: Home / Self Care | Payer: PPO | Source: Ambulatory Visit | Attending: Family Medicine | Admitting: Family Medicine

## 2016-12-31 DIAGNOSIS — Z1239 Encounter for other screening for malignant neoplasm of breast: Secondary | ICD-10-CM

## 2016-12-31 DIAGNOSIS — Z1231 Encounter for screening mammogram for malignant neoplasm of breast: Secondary | ICD-10-CM | POA: Diagnosis not present

## 2017-01-01 ENCOUNTER — Encounter: Payer: Self-pay | Admitting: Internal Medicine

## 2017-01-01 ENCOUNTER — Ambulatory Visit (INDEPENDENT_AMBULATORY_CARE_PROVIDER_SITE_OTHER): Payer: PPO | Admitting: Internal Medicine

## 2017-01-01 VITALS — BP 124/74 | HR 89 | Temp 99.2°F | Ht 65.0 in | Wt 239.0 lb

## 2017-01-01 DIAGNOSIS — J4541 Moderate persistent asthma with (acute) exacerbation: Secondary | ICD-10-CM

## 2017-01-01 NOTE — Patient Instructions (Signed)
--  Start symbicort 160 2 puffs twice daily, rinse mouth after use.  --When symbicort runs out, go back to qvar 2 puffs twice dialy, and use proair when needed.

## 2017-01-01 NOTE — Progress Notes (Signed)
[name] Consultation      Assessment and Plan:  Patient is a 51 year old female with a history of asthma, vocal cord dysfunction, history of bariatric surgery, now presents with increasing dyspnea and chest congestion.  Persistent asthma with exacerbation. -The patient has been off her inhalers for several months. -She is given a sample of Symbicort 160 to be used 2 puffs twice daily today. When she runs out of the sample. I asked her to switch back to her usual Qvar, 2 puffs twice daily and use her pro-air as needed. -She is asked to follow up/call back if she does not improve in the next few weeks. She may require further workup of her dyspnea, which may include an echocardiogram.  History of anxiety and vocal cord dysfunction. -She is going through multiple life stressors, including the recent death of her husband, and the hospitalization of her granddaughter, which may be contributing to her asthma/anxiety.    Date: 01/01/2017  MRN# [id number] JACOBI Davis November 30, 1965   Denise Davis is a 51 y.o. old female seen in consultation for chief complaint of:    Chief Complaint  Patient presents with  . Advice Only    per Dr. Caryl Bis for SOB: chest congestion    HPI:   In June the patient was seen by Dr. Caryl Bis, that time it was noted that she had chest discomfort when laying down. She followed up. More recently, because of the progressive dyspnea. She was sent for a CT of the chest which was negative, it was also noted that her husband passed away recently.   She has noted that she has been having tightness in her chest and could not breathe and feels that she has a "whole lot of stuff in there, and I can't get it out". This has been going on for about 3 weeks, no recent chest colds, she is not around smoke.  In the past she has been on qvar and proventil, she has not used either of them since her breathing improved after weight loss from bariatric  surgery.  Last night she got very bad and could not breathe, then she drank a soda and her braething calmed down.   **Images personally reviewed, CT chest 12/20/16; there are scattered bibasal are dependent areas of ground glass changes, which may be suggestive of mild pulmonary edema, mild bibasilar atelectasis. Otherwise unremarkable.  **On review of old notes, the patient was seen at [facility] pulmonary, she was found to have dyspnea on exertion, thought to be due to morbid obesity. She was planned for bariatric surgery, BMI at that time was 61.Denise Davis She was also tried in the past on qvar and albuterol and noted improvement. She has ahistory  OSA, poorly compliant with  PAP, vocal code dysfunction, panic disorder.    PMHX:   Past Medical History:  Diagnosis Date  . Allergic rhinitis   . Anxiety   . Depression   . Diabetes mellitus   . DVT of leg (deep venous thrombosis) ([place]) 12/2007   left leg, given warfarin for 6 mos  . GERD (gastroesophageal reflux disease)   . H/O hiatal hernia   . Hyperlipidemia   . Hypertension   . Morbid obesity ([place])   . Pericarditis    Diagnosed/treated at [facility]; per patient 4 episodes per cardiologist Dr. Edwin Dada persistant chest pain unclear if pericarditis verusus neuropsychogenic   . Renal cell cancer ([place]) 2009   S/p nephrectomy (left)  . Seizure disorder ([place])  Surgical Hx:  Past Surgical History:  Procedure Laterality Date  . ENDOMETRIAL ABLATION  2007  . ESOPHAGEAL MANOMETRY N/A 05/21/2016   Procedure: ESOPHAGEAL MANOMETRY (EM);  Surgeon: Lucilla Lame, MD;  Location: [facility] ENDOSCOPY;  Service: Endoscopy;  Laterality: N/A;  . ESOPHAGOGASTRODUODENOSCOPY (EGD) WITH PROPOFOL N/A 05/14/2016   Procedure: ESOPHAGOGASTRODUODENOSCOPY (EGD) WITH PROPOFOL;  Surgeon: Lucilla Lame, MD;  Location: [facility] ENDOSCOPY;  Service: Endoscopy;  Laterality: N/A;  . INSERTION OF MESH  2014  . LEFT HEART CATHETERIZATION WITH CORONARY ANGIOGRAM N/A 08/30/2013   Procedure: LEFT HEART  CATHETERIZATION WITH CORONARY ANGIOGRAM;  Surgeon: Troy Sine, MD;  Location: [facility] CATH LAB;  Service: Cardiovascular;  Laterality: N/A;  . NEPHRECTOMY    . TUBAL LIGATION    . UMBILICAL HERNIA REPAIR     Family Hx:  Family History  Problem Relation Age of Onset  . Hypertension Mother   . Breast cancer Mother 21  . Arthritis Mother   . Heart disease Paternal Grandmother   . Alcoholism Father   . Hyperlipidemia Unknown        Parent  . Mental illness Unknown        Parent  . Diabetes Unknown        Parent   Social Hx:   Social History  Substance Use Topics  . Smoking status: Former Research scientist (life sciences)  . Smokeless tobacco: Never Used  . Alcohol use No   Medication:    Current Outpatient Prescriptions:  .  amLODipine (NORVASC) 10 MG tablet, TAKE ONE TABLET BY MOUTH ONCE DAILY, Disp: 90 tablet, Rfl: 3 .  amoxicillin-clavulanate (AUGMENTIN) 875-125 MG tablet, Take 1 tablet by mouth 2 (two) times daily., Disp: 14 tablet, Rfl: 0 .  aspirin EC 81 MG tablet, Take 1 tablet (81 mg total) by mouth daily., Disp: 90 tablet, Rfl: 3 .  calcium-vitamin D 250-100 MG-UNIT tablet, Take 1 tablet by mouth 2 (two) times daily. Reported on 06/16/2015, Disp: 180 tablet, Rfl: 3 .  carvedilol (COREG) 25 MG tablet, TAKE ONE TABLET BY MOUTH TWICE DAILY WITH MEALS, Disp: 180 tablet, Rfl: 1 .  Cholecalciferol (VITAMIN D-3) 1000 units CAPS, Take 1 capsule (1,000 Units total) by mouth daily. Reported on 06/16/2015, Disp: 90 capsule, Rfl: 3 .  dexlansoprazole (DEXILANT) 60 MG capsule, Take 1 capsule (60 mg total) by mouth daily., Disp: 30 capsule, Rfl: 6 .  diclofenac (VOLTAREN) 75 MG EC tablet, , Disp: , Rfl:  .  gabapentin (NEURONTIN) 300 MG capsule, Take 8 capsules (2,400 mg total) by mouth at bedtime., Disp: 240 capsule, Rfl: 3 .  HYDROcodone-acetaminophen (NORCO/VICODIN) 5-325 MG tablet, Take 1 tablet by mouth every 6 (six) hours as needed for moderate pain., Disp: 30 tablet, Rfl: 0 .  loratadine (CLARITIN) 10 MG  tablet, Take 1 tablet (10 mg total) by mouth daily., Disp: 90 tablet, Rfl: 3 .  losartan (COZAAR) 100 MG tablet, Take 1 tablet (100 mg total) by mouth daily., Disp: 90 tablet, Rfl: 3 .  Melatonin 3 MG TABS, Take 9 mg by mouth. , Disp: , Rfl:  .  meloxicam (MOBIC) 7.5 MG tablet, Take 1 tablet (7.5 mg total) by mouth daily., Disp: 30 tablet, Rfl: 0 .  mirtazapine (REMERON) 15 MG tablet, TAKE 1 TABLET BY MOUTH AT BEDTIME, Disp: 90 tablet, Rfl: 0 .  Multiple Vitamins-Minerals (MULTIVITAMIN WITH MINERALS) tablet, Take 1 tablet by mouth daily. Reported on 06/16/2015, Disp: , Rfl:  .  nitroGLYCERIN (NITROSTAT) 0.4 MG SL tablet, Place 1 tablet (0.4 mg total) under  the tongue every 5 (five) minutes as needed for chest pain. (may repeat every 5 minutes but seek medical help if pain persists after 3 tablets), Disp: 25 tablet, Rfl: 0 .  omeprazole (PRILOSEC) 40 MG capsule, Take 1 capsule (40 mg total) by mouth 2 (two) times daily before a meal., Disp: 60 capsule, Rfl: 0 .  ondansetron (ZOFRAN-ODT) 4 MG disintegrating tablet, DISSOLVE ONE TABLET IN MOUTH EVERY 8 HOURS AS NEEDED FOR NAUSEA OR VOMITING., Disp: 20 tablet, Rfl: 0 .  potassium chloride 20 MEQ/15ML (10%) SOLN, Take 30 mLs (40 mEq total) by mouth 2 (two) times daily., Disp: 120 mL, Rfl: 0 .  promethazine (PHENERGAN) 25 MG tablet, TAKE 1 TABLET BY MOUTH EVERY 8 HOURS AS NEEDED FOR NAUSEA OR VOMITING, Disp: 30 tablet, Rfl: 2 .  QUEtiapine (SEROQUEL) 100 MG tablet, Take 1 tablet (100 mg total) by mouth at bedtime., Disp: 90 tablet, Rfl: 0 .  sertraline (ZOLOFT) 100 MG tablet, Take 2 tablets (200 mg total) by mouth at bedtime., Disp: 180 tablet, Rfl: 1 .  triamcinolone cream (KENALOG) 0.1 %, Apply 1 application topically 2 (two) times daily., Disp: 30 g, Rfl: 0 .  vitamin B-12 (CYANOCOBALAMIN) 500 MCG tablet, Take by mouth., Disp: , Rfl:    Allergies:  Lisinopril and Milk-related compounds  Review of Systems: Gen:  Denies  fever, sweats, chills HEENT:  Denies blurred vision, double vision. bleeds, sore throat Cvc:  No dizziness, chest pain. Resp:   Denies cough or sputum production, shortness of breath Gi: Denies swallowing difficulty, stomach pain. Gu:  Denies bladder incontinence, burning urine Ext:   No Joint pain, stiffness. Skin: No skin rash,  hives  Endoc:  No polyuria, polydipsia. Psych: No depression, insomnia. Other:  All other systems were reviewed with the patient and were negative other that what is mentioned in the HPI.   Physical Examination:   VS: BP 124/74 (BP Location: Left Arm, Cuff Size: Normal)   Pulse 89   Ht 5\' 5"  (1.651 m)   Wt 239 lb (108.4 kg)   SpO2 100%   BMI 39.77 kg/m   General Appearance: No distress  Neuro:without focal findings,  speech normal,  HEENT: PERRLA, EOM intact.   Pulmonary: normal breath sounds, scattered expiratory rhonchi.  CardiovascularNormal S1,S2.  No m/r/g.   Abdomen: Benign, Soft, non-tender. Renal:  No costovertebral tenderness  GU:  No performed at this time. Endoc: No evident thyromegaly, no signs of acromegaly. Skin:   warm, no rashes, no ecchymosis  Extremities: normal, no cyanosis, clubbing.  Other findings:    LABORATORY PANEL:   CBC No results for input(s): WBC, HGB, HCT, PLT in the last 168 hours. ------------------------------------------------------------------------------------------------------------------  Chemistries  No results for input(s): NA, K, CL, CO2, GLUCOSE, BUN, CREATININE, CALCIUM, MG, AST, ALT, ALKPHOS, BILITOT in the last 168 hours.  Invalid input(s): GFRCGP ------------------------------------------------------------------------------------------------------------------  Cardiac Enzymes No results for input(s): TROPONINI in the last 168 hours. ------------------------------------------------------------  RADIOLOGY:  Mammogram Digital Screening  Result Date: 12/31/2016 CLINICAL DATA:  Screening. EXAM: DIGITAL SCREENING BILATERAL  MAMMOGRAM WITH CAD COMPARISON:  Previous exam(s). ACR Breast Density Category c: The breast tissue is heterogeneously dense, which may obscure small masses. FINDINGS: There are no findings suspicious for malignancy. Images were processed with CAD. IMPRESSION: No mammographic evidence of malignancy. A result letter of this screening mammogram will be mailed directly to the patient. RECOMMENDATION: Screening mammogram in one year. (Code:SM-B-01Y) BI-RADS CATEGORY  1: Negative. Electronically Signed   By: Sherran Needs.D.  On: 12/31/2016 12:03       Thank  you for the consultation and for allowing [facility] to assist in the care of your patient. Our recommendations are noted above.  Please contact us if we can be of further service.   Marda Stalker, MD.  Board Certified in Internal Medicine, Pulmonary Medicine, [place], and Sleep Medicine.  [facility] Office Number: [phone]  Patricia Pesa, M.D.  Merton Border, M.D  01/01/2017

## 2017-01-02 ENCOUNTER — Telehealth: Payer: Self-pay | Admitting: Family Medicine

## 2017-01-02 NOTE — Telephone Encounter (Signed)
Patient Name: Denise Davis DOB: 10-04-1965 Initial Comment Caller states she went to the Pulmonologist, given inhaler steroid, dizziness today, and wondering what to do. MD: Josephina Gip. Nurse Assessment Nurse: Holly Bodily, RN, Nicci Date/Time (Eastern Time): 01/02/2017 1:42:46 PM Confirm and document reason for call. If symptomatic, describe symptoms. ---Caller states she went to the Pulmonologist gave her an inhaler and steroid. Today she is dizzy, congested and cannot get any mucous out when she tries to blow her nose. Does the patient have any new or worsening symptoms? ---Yes Will a triage be completed? ---Yes Related visit to physician within the last 2 weeks? ---Yes Does the PT have any chronic conditions? (i.e. diabetes, asthma, etc.) ---Yes List chronic conditions. ---Vocal chord dysfunction Is the patient pregnant or possibly pregnant? (Ask all females between the ages of 64-55) ---No Is this a behavioral health or substance abuse call? ---No Guidelines Guideline Title Affirmed Question Affirmed Notes Dizziness - Lightheadedness [1] MODERATE dizziness (e.g., interferes with normal activities) AND [2] has NOT been evaluated by physician for this (Exception: dizziness caused by heat exposure, sudden standing, or poor fluid intake) Final Disposition User See Physician within 24 Hours Corum, RN, Nicci Comments Appointment scheduled for 10/5 at 7:15am at the [facility] site. Referrals REFERRED TO PCP OFFICE Caller Disagree/Comply Comply Caller Understands Yes PreDisposition Did not know what to do

## 2017-01-02 NOTE — Telephone Encounter (Signed)
FYI   Scheduled at [facility] 10/5 at 7:15am

## 2017-01-02 NOTE — Telephone Encounter (Signed)
Noted  

## 2017-01-03 ENCOUNTER — Ambulatory Visit (INDEPENDENT_AMBULATORY_CARE_PROVIDER_SITE_OTHER): Payer: PPO | Admitting: Primary Care

## 2017-01-03 ENCOUNTER — Encounter: Payer: Self-pay | Admitting: Primary Care

## 2017-01-03 VITALS — BP 124/72 | HR 81 | Temp 98.5°F | Ht 65.0 in | Wt 236.8 lb

## 2017-01-03 DIAGNOSIS — R0602 Shortness of breath: Secondary | ICD-10-CM

## 2017-01-03 DIAGNOSIS — F4321 Adjustment disorder with depressed mood: Secondary | ICD-10-CM

## 2017-01-03 NOTE — Progress Notes (Signed)
Subjective:    Patient ID: Denise Davis, female    DOB: [birth year], 52 y.o.   MRN: [id number]  HPI  [name] is a 51 year old female with a history of allergic rhinitis, asthma, vocal cord dysfunction, hypertension, diabetes type 2, OSA who presents today with a chief complaint of chest congestion. She also reports headaches, dizziness, shortness of breath.   She presented to her PCP's office on 12/18/16 with a 2 week history of shortness of breath at rest and/or with exertion. She endorsed that she heard "crackling" in her chest as well as wheezing with a fever of 101.2 the prior evening. No other symptoms, no history of COPD, CHF. Chest xray on 12/18/16 unremarkable. Symptoms and evaluation were concerning for PE as her D-Dimmer was positive so she underwent CT angio which was negative. Her PCP believes symptoms could be from anxiety/grief as she is going through several life stressors including the death of her husband. She was sent for pulmonology and cardiology evaluation.   She underwent pulmonology evaluation on 01/01/17. She admitted she'd been off of her inhalers for several months and was provided with samples of Symbicort 160 (2 puffs BID). She was previously managed on albuterol and Qvar but has not used since weight loss s/p bariatric surgery.   She woke up yesterday morning she felt "out of it", couldn't get out of bed, no appetite. She still feels as though she's congested in the upper chest and cannot cough out any of this. The congestion has moved up from her lower chest to her upper chest. She's used the inhaler since prescribed, hasn't noticed much improvement in her breathing. She used Afrin last night for nasal congestion with improvement. She's running fevers at night running 99.8-9 with her last one being 102.3 last night. She denies cough, sore throat, ear pain.   She does feel stressed and is grieving the loss of her husband. Her grandson is in the hospital, she can't  see him because her car doesn't currently work. She is interested in seeing a therapist.  Review of Systems  Constitutional: Positive for fatigue and fever.  HENT: Positive for congestion. Negative for ear pain, sinus pressure and sore throat.   Respiratory: Positive for shortness of breath and wheezing.   Cardiovascular: Negative for chest pain.  Neurological: Positive for dizziness and headaches.       Past Medical History:  Diagnosis Date  . Allergic rhinitis   . Anxiety   . Depression   . Diabetes mellitus   . DVT of leg (deep venous thrombosis) ([place]) 12/2007   left leg, given warfarin for 6 mos  . GERD (gastroesophageal reflux disease)   . H/O hiatal hernia   . Hyperlipidemia   . Hypertension   . Morbid obesity ([place])   . Pericarditis    Diagnosed/[facility] at [facility]; per patient 4 episodes per cardiologist Dr. Edwin Dada persistant chest pain unclear if pericarditis verusus neuropsychogenic   . Renal cell cancer ([place]) 2009   S/p nephrectomy (left)  . Seizure disorder [facility])      Social History   Social History  . Marital status: Widowed    Spouse name: N/A  . Number of children: N/A  . Years of education: N/A   Occupational History  . Not on file.   Social History Main Topics  . Smoking status: Former Research scientist (life sciences)  . Smokeless tobacco: Never Used  . Alcohol use No  . Drug use: No  . Sexual activity: Not Currently  Other Topics Concern  . 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 ENDOMETRIAL ABLATION  2007  . ESOPHAGEAL MANOMETRY N/A 05/21/2016   Procedure: ESOPHAGEAL MANOMETRY (EM);  Surgeon: Lucilla Lame, MD;  Location: [facility] ENDOSCOPY;  Service: Endoscopy;  Laterality: N/A;  . ESOPHAGOGASTRODUODENOSCOPY (EGD) WITH PROPOFOL N/A 05/14/2016   Procedure:  ESOPHAGOGASTRODUODENOSCOPY (EGD) WITH PROPOFOL;  Surgeon: Lucilla Lame, MD;  Location: [facility] ENDOSCOPY;  Service: Endoscopy;  Laterality: N/A;  . INSERTION OF MESH  2014  . LEFT HEART CATHETERIZATION WITH CORONARY ANGIOGRAM N/A 08/30/2013   Procedure: LEFT HEART CATHETERIZATION WITH CORONARY ANGIOGRAM;  Surgeon: Troy Sine, MD;  Location: [facility] CATH LAB;  Service: Cardiovascular;  Laterality: N/A;  . NEPHRECTOMY    . TUBAL LIGATION    . UMBILICAL HERNIA REPAIR      Family History  Problem Relation Age of Onset  . Hypertension Mother   . Breast cancer Mother 6  . Arthritis Mother   . Heart disease Paternal Grandmother   . Alcoholism Father   . Hyperlipidemia Unknown        Parent  . Mental illness Unknown        Parent  . Diabetes Unknown        Parent    Allergies  Allergen Reactions  . Lisinopril Cough  . Milk-Related Compounds Diarrhea and Nausea And Vomiting    "only milk causes problems. Milk products are ok"    Current Outpatient Prescriptions on File Prior to Visit  Medication Sig Dispense Refill  . amLODipine (NORVASC) 10 MG tablet TAKE ONE TABLET BY MOUTH ONCE DAILY 90 tablet 3  . aspirin EC 81 MG tablet Take 1 tablet (81 mg total) by mouth daily. 90 tablet 3  . calcium-vitamin D 250-100 MG-UNIT tablet Take 1 tablet by mouth 2 (two) times daily. Reported on 06/16/2015 180 tablet 3  . carvedilol (COREG) 25 MG tablet TAKE ONE TABLET BY MOUTH TWICE DAILY WITH MEALS 180 tablet 1  . Cholecalciferol (VITAMIN D-3) 1000 units CAPS Take 1 capsule (1,000 Units total) by mouth daily. Reported on 06/16/2015 90 capsule 3  . dexlansoprazole (DEXILANT) 60 MG capsule Take 1 capsule (60 mg total) by mouth daily. 30 capsule 6  . diclofenac (VOLTAREN) 75 MG EC tablet     . gabapentin (NEURONTIN) 300 MG capsule Take 8 capsules (2,400 mg total) by mouth at bedtime. 240 capsule 3  . HYDROcodone-acetaminophen (NORCO/VICODIN) 5-325 MG tablet Take 1 tablet by mouth every 6 (six) hours as needed  for moderate pain. 30 tablet 0  . loratadine (CLARITIN) 10 MG tablet Take 1 tablet (10 mg total) by mouth daily. 90 tablet 3  . losartan (COZAAR) 100 MG tablet Take 1 tablet (100 mg total) by mouth daily. 90 tablet 3  . Melatonin 3 MG TABS Take 9 mg by mouth.     . meloxicam (MOBIC) 7.5 MG tablet Take 1 tablet (7.5 mg total) by mouth daily. 30 tablet 0  . mirtazapine (REMERON) 15 MG tablet TAKE 1 TABLET BY MOUTH AT BEDTIME 90 tablet 0  . Multiple Vitamins-Minerals (MULTIVITAMIN WITH MINERALS) tablet Take 1 tablet by mouth daily. Reported on 06/16/2015    . nitroGLYCERIN (NITROSTAT) 0.4 MG SL tablet Place 1 tablet (0.4 mg  total) under the tongue every 5 (five) minutes as needed for chest pain. (may repeat every 5 minutes but seek medical help if pain persists after 3 tablets) 25 tablet 0  . ondansetron (ZOFRAN-ODT) 4 MG disintegrating tablet DISSOLVE ONE TABLET IN MOUTH EVERY 8 HOURS AS NEEDED FOR NAUSEA OR VOMITING. 20 tablet 0  . potassium chloride 20 MEQ/15ML (10%) SOLN Take 30 mLs (40 mEq total) by mouth 2 (two) times daily. 120 mL 0  . promethazine (PHENERGAN) 25 MG tablet TAKE 1 TABLET BY MOUTH EVERY 8 HOURS AS NEEDED FOR NAUSEA OR VOMITING 30 tablet 2  . QUEtiapine (SEROQUEL) 100 MG tablet Take 1 tablet (100 mg total) by mouth at bedtime. 90 tablet 0  . sertraline (ZOLOFT) 100 MG tablet Take 2 tablets (200 mg total) by mouth at bedtime. 180 tablet 1  . triamcinolone cream (KENALOG) 0.1 % Apply 1 application topically 2 (two) times daily. 30 g 0  . vitamin B-12 (CYANOCOBALAMIN) 500 MCG tablet Take by mouth.     No current facility-administered medications on file prior to visit.     BP 124/72   Pulse 81   Temp 98.5 F (36.9 C) (Oral)   Ht 5\' 5"  (1.651 m)   Wt 236 lb 12.8 oz (107.4 kg)   SpO2 98%   BMI 39.41 kg/m    Objective:   Physical Exam  Constitutional: She appears well-nourished. She does not appear ill.  HENT:  Right Ear: Tympanic membrane and ear canal normal.  Left  Ear: Tympanic membrane and ear canal normal.  Nose: Right sinus exhibits no maxillary sinus tenderness and no frontal sinus tenderness. Left sinus exhibits no maxillary sinus tenderness and no frontal sinus tenderness.  Mouth/Throat: Oropharynx is clear and moist.  Eyes: Conjunctivae are normal.  Neck: Neck supple.  Cardiovascular: Normal rate and regular rhythm.   Pulmonary/Chest: Effort normal and breath sounds normal. She has no wheezes. She has no rales.  Lymphadenopathy:    She has no cervical adenopathy.  Skin: Skin is warm and dry.  Psychiatric: She has a normal mood and affect.          Assessment & Plan:

## 2017-01-03 NOTE — Patient Instructions (Signed)
Continue Symbicort inhaler as instructed.  For congestion in the chest try taking Mucinex. This will help loosen up the mucous in your chest. Ensure you take this medication with a full glass of water.  Please call the lung doctor if no improvement in 2 weeks as instructed.  You will be contacted regarding your referral to therapy.  Please let us know if you have not heard back within one week.   It was a pleasure meeting you!

## 2017-01-03 NOTE — Assessment & Plan Note (Signed)
Suspect symptoms are multifactorial including allergic rhinitis, asthma, and anxiety/depression. Exam today very reassuring, does not appear ill, clear lungs. Suspect more anxiety than anything. All imaging reviewed and seems reassuring. Will have her continue Symbicort as prescribed, add Mucinex for chest congestion.  Referral placed for therapy. She will update her pulmonologist as instructed.

## 2017-01-06 ENCOUNTER — Telehealth: Payer: Self-pay

## 2017-01-06 ENCOUNTER — Other Ambulatory Visit
Admission: RE | Admit: 2017-01-06 | Discharge: 2017-01-06 | Disposition: A | Discharge status: Home / Self Care | Payer: PPO | Source: Ambulatory Visit | Attending: Family Medicine | Admitting: Family Medicine

## 2017-01-06 ENCOUNTER — Other Ambulatory Visit: Payer: PPO

## 2017-01-06 DIAGNOSIS — D72819 Decreased white blood cell count, unspecified: Secondary | ICD-10-CM | POA: Insufficient documentation

## 2017-01-06 LAB — CBC WITH DIFFERENTIAL/PLATELET
BASOS ABS: 0 10*3/uL (ref 0–0.1)
BASOS PCT: 1 %
EOS ABS: 0.1 10*3/uL (ref 0–0.7)
EOS PCT: 3 %
HEMATOCRIT: 42.7 % (ref 35.0–47.0)
Hemoglobin: 14.4 g/dL (ref 12.0–16.0)
Lymphocytes Relative: 49 %
Lymphs Abs: 2.4 10*3/uL (ref 1.0–3.6)
MCH: 31.3 pg (ref 26.0–34.0)
MCHC: 33.6 g/dL (ref 32.0–36.0)
MCV: 93.1 fL (ref 80.0–100.0)
MONO ABS: 0.3 10*3/uL (ref 0.2–0.9)
MONOS PCT: 7 %
NEUTROS ABS: 1.9 10*3/uL (ref 1.4–6.5)
Neutrophils Relative %: 40 %
PLATELETS: 185 10*3/uL (ref 150–440)
RBC: 4.59 MIL/uL (ref 3.80–5.20)
RDW: 15 % — AB (ref 11.5–14.5)
WBC: 4.7 10*3/uL (ref 3.6–11.0)

## 2017-01-06 NOTE — Telephone Encounter (Signed)
Noted. We'll see what her CBC shows. I will forward to Melissa to see if the patient has been set up with cardiology yet.

## 2017-01-06 NOTE — Telephone Encounter (Signed)
She is scheduled with Dr.Gollan on 11/26. I believe it was sent for her to be seen by Dr. Rockey Situ. Will any provider be ok?

## 2017-01-06 NOTE — Telephone Encounter (Signed)
Patient is set up for lab today,she states she is not feeling better

## 2017-01-08 NOTE — Telephone Encounter (Signed)
Any provider is fine.

## 2017-01-10 ENCOUNTER — Other Ambulatory Visit: Payer: Self-pay | Admitting: Family Medicine

## 2017-01-10 DIAGNOSIS — F32A Depression, unspecified: Secondary | ICD-10-CM

## 2017-01-10 DIAGNOSIS — F329 Major depressive disorder, single episode, unspecified: Secondary | ICD-10-CM

## 2017-01-10 DIAGNOSIS — F419 Anxiety disorder, unspecified: Principal | ICD-10-CM

## 2017-01-10 MED ORDER — TRIAMCINOLONE ACETONIDE 0.1 % EX CREA: 1.0000 "application " | TOPICAL_CREAM | Freq: Two times a day (BID) | CUTANEOUS | 0 refills | Status: DC

## 2017-01-10 MED ORDER — QUETIAPINE FUMARATE 100 MG PO TABS: 100.0000 mg | ORAL_TABLET | Freq: Every day | ORAL | 0 refills | Status: DC

## 2017-01-10 MED ORDER — MIRTAZAPINE 15 MG PO TABS: 15.0000 mg | ORAL_TABLET | Freq: Every day | ORAL | 0 refills | Status: DC

## 2017-01-10 NOTE — Telephone Encounter (Signed)
Refill sent to pharmacy with the exception of meloxicam. Please contact her and see what she needs this for. I placed a referral to psychiatry. We discussed having her see psychiatry for her Seroquel and Remeron refills. Please reinforce this with her.

## 2017-01-10 NOTE — Telephone Encounter (Signed)
Last OV 09/26/16 last filled meloxicam 09/26/16 30 0rf Mirtazapine 12/17/16 90 0rf Quetiapine 09/26/16 90 0rf Kenalog  01/09/16 30g 0rf

## 2017-01-13 ENCOUNTER — Ambulatory Visit (INDEPENDENT_AMBULATORY_CARE_PROVIDER_SITE_OTHER): Payer: PPO

## 2017-01-13 DIAGNOSIS — Z23 Encounter for immunization: Secondary | ICD-10-CM | POA: Diagnosis not present

## 2017-01-13 NOTE — Telephone Encounter (Signed)
Patient states she takes the meloxicam for her left knee. Patient states psychiatry only has appmts in [place], she would like to see bambi and is scheduled fore the end of this month.

## 2017-01-13 NOTE — Telephone Encounter (Signed)
Please let the patient know that the meloxicam is something that we should not have her on long-term given her history of gastric bypass. If she continues to need something for discomfort we will need to figure out a better option. Thanks.

## 2017-01-17 NOTE — Telephone Encounter (Signed)
Patient states she saw orthopaedic and they informed her to take aleve, she states she will only take aleve as needed. She states she does not want the meloxicam if it will have a bad reaction to her body.

## 2017-01-23 ENCOUNTER — Ambulatory Visit (INDEPENDENT_AMBULATORY_CARE_PROVIDER_SITE_OTHER): Payer: PPO | Admitting: Psychology

## 2017-01-23 DIAGNOSIS — F331 Major depressive disorder, recurrent, moderate: Secondary | ICD-10-CM | POA: Diagnosis not present

## 2017-01-24 ENCOUNTER — Ambulatory Visit (INDEPENDENT_AMBULATORY_CARE_PROVIDER_SITE_OTHER): Payer: PPO | Admitting: Family Medicine

## 2017-01-24 ENCOUNTER — Encounter: Payer: Self-pay | Admitting: Family Medicine

## 2017-01-24 VITALS — BP 118/90 | HR 77 | Temp 98.5°F | Wt 237.2 lb

## 2017-01-24 DIAGNOSIS — H8111 Benign paroxysmal vertigo, right ear: Secondary | ICD-10-CM

## 2017-01-24 DIAGNOSIS — E876 Hypokalemia: Secondary | ICD-10-CM

## 2017-01-24 DIAGNOSIS — G40909 Epilepsy, unspecified, not intractable, without status epilepticus: Secondary | ICD-10-CM

## 2017-01-24 DIAGNOSIS — F329 Major depressive disorder, single episode, unspecified: Secondary | ICD-10-CM

## 2017-01-24 DIAGNOSIS — F419 Anxiety disorder, unspecified: Secondary | ICD-10-CM | POA: Diagnosis not present

## 2017-01-24 DIAGNOSIS — I10 Essential (primary) hypertension: Secondary | ICD-10-CM

## 2017-01-24 DIAGNOSIS — K219 Gastro-esophageal reflux disease without esophagitis: Secondary | ICD-10-CM

## 2017-01-24 DIAGNOSIS — F32A Depression, unspecified: Secondary | ICD-10-CM

## 2017-01-24 DIAGNOSIS — Z87898 Personal history of other specified conditions: Secondary | ICD-10-CM | POA: Diagnosis not present

## 2017-01-24 DIAGNOSIS — Z9884 Bariatric surgery status: Secondary | ICD-10-CM

## 2017-01-24 NOTE — Assessment & Plan Note (Signed)
Improved with Dexilant.

## 2017-01-24 NOTE — Assessment & Plan Note (Signed)
Questionable seizure disorder. No longer on medication. Start on any medication. We'll have her see neurology in [place] for second opinion.

## 2017-01-24 NOTE — Assessment & Plan Note (Signed)
Vertigo symptoms consistent with BPPV. Positive Dix-Hallpike. Given modified Epley for the right side. If not improving she'll let us know. Given return precautions.

## 2017-01-24 NOTE — Progress Notes (Signed)
Tommi Rumps, MD Phone: [phone]  Denise Davis is a 51 y.o. female who presents today for follow-up.  Patient has been dealing with anxiety and depression for some time now. This has been added to by the grief of losing her husband earlier this year. She's been sad. She did see a therapist though she does not want to see them again as she did not get much out of them. She has been on Seroquel, Remeron, and Zoloft for some time. She does note some decreased appetite though that is somewhat chronic as well. No SI or HI.  She's had chronic intermittent nausea since having gastric bypass surgery. She had a G-tube in place though this was removed and once it was removed her nausea improved quite a bit. She notes the nausea has been slightly more prevalent recently. She's been taking Zofran and promethazine. She's had no vomiting or diarrhea. No abdominal pain. She notes her reflux is well controlled with the [place]. No blood in her stool. He  Patient notes intermittent vertigo symptoms recently. Occurs if she changes head positions particularly when laying down or when washing her hair. The room will spin for several seconds and then I will stop spinning. She notes no hearing changes or tinnitus.  Patient has a history of possible seizures in the past. She underwent an extensive evaluation per her report with cardiology and neurology. She had episodes where she would black out and not remember anything. She seemed to have slight postictal phase following this. Only she will once. She was on Keppra for some time though she took herself off of this and has not had any further seizures. It was possibly felt to be tramadol related.  PMH: Former smoker   ROS see history of present illness  Objective  Physical Exam Vitals:   01/24/17 1534  BP: 118/90  Pulse: 77  Temp: 98.5 F (36.9 C)  SpO2: 99%    BP Readings from Last 3 Encounters:  01/24/17 118/90  01/03/17 124/72  01/01/17  124/74   Wt Readings from Last 3 Encounters:  01/24/17 237 lb 3.2 oz (107.6 kg)  01/03/17 236 lb 12.8 oz (107.4 kg)  01/01/17 239 lb (108.4 kg)    Physical Exam  Constitutional: No distress.  Cardiovascular: Normal rate and regular rhythm.   Pulmonary/Chest: Effort normal and breath sounds normal.  Abdominal: Soft. Bowel sounds are normal. She exhibits no distension. There is no tenderness. There is no rebound and no guarding.  Musculoskeletal: She exhibits no edema.  Neurological: She is alert. Gait normal.  Skin: Skin is warm and dry. She is not diaphoretic.  Psychiatric:  Mood depressed, affect flat   positive Dix-Hallpike on the right with nystagmus, negative Dix-Hallpike on the left Normal TMs bilaterally  Assessment/Plan: Please see individual problem list.  Seizure disorder ([place]) Questionable seizure disorder. No longer on medication. Start on any medication. We'll have her see neurology in [place] for second opinion.  Anxiety and depression Patient is continuing to have issues with anxiety and depression is added to by the death of her husband. She does note some grief. She has been on Seroquel, and Zoloft time now. I have previously discussed seeing psychiatry with her that she has been hesitant or sees them and then stopped seeing them. I will refer her to Dr. for follow-up. We will get her plugged in with the counselor that will be starting in our office.  Hypokalemia Patient has had recurrent issues with this. We'll recheck potassium.  Status post gastric bypass for obesity Patient has had persistent nausea following bariatric surgery. Continues to use Zofran and promethazine. Reflux has improved. We'll get her back in to see her GI physician.  BPPV (benign paroxysmal positional vertigo), right Vertigo symptoms consistent with BPPV. Positive Dix-Hallpike. Given modified Epley for the right side. If not improving she'll let us know. Given return precautions.  GERD  (gastroesophageal reflux disease) Improved with Dexilant.   Orders Placed This Encounter  Procedures  . Basic Metabolic Panel (BMET)  . Ambulatory referral to Neurology    Referral Priority:   Routine    Referral Type:   Consultation    Referral Reason:   Specialty Services Required    Requested Specialty:   Neurology    Number of Visits Requested:   1  . Ambulatory referral to Psychiatry    Referral Priority:   Routine    Referral Type:   Psychiatric    Referral Reason:   Specialty Services Required    Requested Specialty:   Psychiatry    Number of Visits Requested:   1  . Ambulatory referral to Psychology    Referral Priority:   Routine    Referral Type:   Psychiatric    Referral Reason:   Specialty Services Required    Requested Specialty:   Psychology    Number of Visits Requested:   [place], MD [place]

## 2017-01-24 NOTE — Assessment & Plan Note (Signed)
Patient has had persistent nausea following bariatric surgery. Continues to use Zofran and promethazine. Reflux has improved. We'll get her back in to see her GI physician.

## 2017-01-24 NOTE — Assessment & Plan Note (Signed)
Patient has had recurrent issues with this. We'll recheck potassium.

## 2017-01-24 NOTE — Patient Instructions (Signed)
Nice to see you. We'll get you set up with neurology, psychology, psychiatry, and GI. Please do the exercises for your vertigo. If it worsens please let us know. If you develop thoughts of harming yourself or others please go to the emergency room.

## 2017-01-24 NOTE — Assessment & Plan Note (Signed)
Patient is continuing to have issues with anxiety and depression is added to by the death of her husband. She does note some grief. She has been on Seroquel, and Zoloft time now. I have previously discussed seeing psychiatry with her that she has been hesitant or sees them and then stopped seeing them. I will refer her to Dr. for follow-up. We will get her plugged in with the counselor that will be starting in our office.

## 2017-01-25 ENCOUNTER — Other Ambulatory Visit: Payer: Self-pay | Admitting: Family Medicine

## 2017-01-25 DIAGNOSIS — E878 Other disorders of electrolyte and fluid balance, not elsewhere classified: Secondary | ICD-10-CM

## 2017-01-25 LAB — BASIC METABOLIC PANEL
BUN: 13 mg/dL (ref 7–25)
CHLORIDE: 108 mmol/L (ref 98–110)
CO2: 18 mmol/L — AB (ref 20–32)
CREATININE: 0.83 mg/dL (ref 0.50–1.05)
Calcium: 9.2 mg/dL (ref 8.6–10.4)
Glucose, Bld: 84 mg/dL (ref 65–99)
Potassium: 4.5 mmol/L (ref 3.5–5.3)
Sodium: 141 mmol/L (ref 135–146)

## 2017-01-25 LAB — SPECIMEN COMPROMISED

## 2017-01-27 NOTE — Progress Notes (Signed)
Referral sent to dr. Allen Norris

## 2017-01-28 ENCOUNTER — Ambulatory Visit: Payer: Self-pay | Admitting: Psychology

## 2017-02-03 ENCOUNTER — Other Ambulatory Visit: Payer: PPO

## 2017-02-03 ENCOUNTER — Ambulatory Visit: Payer: PPO | Admitting: Psychology

## 2017-02-03 ENCOUNTER — Ambulatory Visit (INDEPENDENT_AMBULATORY_CARE_PROVIDER_SITE_OTHER): Payer: PPO | Admitting: Licensed Clinical Social Worker

## 2017-02-03 DIAGNOSIS — F329 Major depressive disorder, single episode, unspecified: Secondary | ICD-10-CM

## 2017-02-03 DIAGNOSIS — F419 Anxiety disorder, unspecified: Secondary | ICD-10-CM | POA: Diagnosis not present
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
## 2017-02-03 NOTE — Addendum Note (Signed)
Addended by: Arby Barrette on: 02/03/2017 01:41 PM   Modules accepted: Orders

## 2017-02-06 ENCOUNTER — Other Ambulatory Visit: Payer: PPO

## 2017-02-11 ENCOUNTER — Ambulatory Visit: Payer: PPO | Admitting: Psychology

## 2017-02-12 ENCOUNTER — Other Ambulatory Visit (INDEPENDENT_AMBULATORY_CARE_PROVIDER_SITE_OTHER): Payer: PPO

## 2017-02-12 DIAGNOSIS — E878 Other disorders of electrolyte and fluid balance, not elsewhere classified: Secondary | ICD-10-CM

## 2017-02-12 DIAGNOSIS — S8392XA Sprain of unspecified site of left knee, initial encounter: Secondary | ICD-10-CM | POA: Diagnosis not present

## 2017-02-12 DIAGNOSIS — Z96652 Presence of left artificial knee joint: Secondary | ICD-10-CM | POA: Diagnosis not present

## 2017-02-12 DIAGNOSIS — Z471 Aftercare following joint replacement surgery: Secondary | ICD-10-CM | POA: Diagnosis not present

## 2017-02-12 LAB — BASIC METABOLIC PANEL
BUN: 18 mg/dL (ref 6–23)
CO2: 27 meq/L (ref 19–32)
Calcium: 9.3 mg/dL (ref 8.4–10.5)
Chloride: 109 mEq/L (ref 96–112)
Creatinine, Ser: 0.75 mg/dL (ref 0.40–1.20)
GFR: 104.59 mL/min (ref >60.00)
GLUCOSE: 117 mg/dL — AB (ref 70–99)
POTASSIUM: 4 meq/L (ref 3.5–5.1)
SODIUM: 141 meq/L (ref 135–145)

## 2017-02-17 ENCOUNTER — Other Ambulatory Visit: Payer: Self-pay | Admitting: Family Medicine

## 2017-02-22 NOTE — Progress Notes (Signed)
Cardiology Office Note  Date:  02/24/2017   ID:  Denise Davis, DOB 08-Sep-1965, MRN [id number]  PCP:  Leone Haven, MD   Chief Complaint  Patient presents with  . other     referred by dr. Caryl Bis for dyspnea hx with Dr. Edwin Dada at [facility]. Pt c/o sob; cp at times. Reviewed medications with pt verbally.    HPI:  51 year old woman with past medical history of chronic intermittent nausea since having gastric bypass surgery anxiety and depression grief of losing her husband intermittent vertigo symptoms  black outs, Questionable seizure disorder. Former smoker Who presents by referral from Dr. Caryl Bis for shortness of breath  She reports long history of chest pain and shortness of breath Typically wakes up early as  is her habit Shortly after she wakes up she has shortness of breath, left-sided chest pain, dizziness with chronic nausea  has stayed on the same medications despite 170 pound weight loss  BP low at home 98/70 by her measurements  Was previously weight 400 , lost down to 233   Previous studies were reviewed with her in detail Echocardiogram September 2017 Essentially normal study  Cardiac cath May 2015 Minimal nonobstructive coronary disease Performed by Dr. Claiborne Billings  EKG personally reviewed by myself on todays visit Shows normal sinus rhythm rate 81 bpm nonspecific T wave abnormality V1 through V3, seen previously  PMH:   has a past medical history of Allergic rhinitis, Anxiety, Depression, Diabetes mellitus, DVT of leg (deep venous thrombosis) ([place]) (12/2007), GERD (gastroesophageal reflux disease), H/O hiatal hernia, Hyperlipidemia, Hypertension, Morbid obesity ([place]), Pericarditis, Renal cell cancer ([place]) (2009), and Seizure disorder ([place]).  PSH:    Past Surgical History:  Procedure Laterality Date  . ENDOMETRIAL ABLATION  2007  . ESOPHAGEAL MANOMETRY N/A 05/21/2016   Procedure: ESOPHAGEAL MANOMETRY (EM);  Surgeon: Lucilla Lame, MD;  Location: [facility]  ENDOSCOPY;  Service: Endoscopy;  Laterality: N/A;  . ESOPHAGOGASTRODUODENOSCOPY (EGD) WITH PROPOFOL N/A 05/14/2016   Procedure: ESOPHAGOGASTRODUODENOSCOPY (EGD) WITH PROPOFOL;  Surgeon: Lucilla Lame, MD;  Location: [facility] ENDOSCOPY;  Service: Endoscopy;  Laterality: N/A;  . INSERTION OF MESH  2014  . LEFT HEART CATHETERIZATION WITH CORONARY ANGIOGRAM N/A 08/30/2013   Procedure: LEFT HEART CATHETERIZATION WITH CORONARY ANGIOGRAM;  Surgeon: Troy Sine, MD;  Location: [facility] CATH LAB;  Service: Cardiovascular;  Laterality: N/A;  . NEPHRECTOMY    . TUBAL LIGATION    . UMBILICAL HERNIA REPAIR      Current Outpatient Medications  Medication Sig Dispense Refill  . amoxicillin-clavulanate (AUGMENTIN) 875-125 MG tablet Take 1 tablet by mouth. For dental    . calcium-vitamin D 250-100 MG-UNIT tablet Take 1 tablet by mouth 2 (two) times daily. Reported on 06/16/2015 180 tablet 3  . carvedilol (COREG) 12.5 MG tablet Take 1 tablet (12.5 mg total) by mouth 2 (two) times daily with a meal. 60 tablet 6  . Cholecalciferol (VITAMIN D-3) 1000 units CAPS Take 1 capsule (1,000 Units total) by mouth daily. Reported on 06/16/2015 90 capsule 3  . dexlansoprazole (DEXILANT) 60 MG capsule Take 1 capsule (60 mg total) by mouth daily. 30 capsule 6  . gabapentin (NEURONTIN) 300 MG capsule Take 8 capsules (2,400 mg total) by mouth at bedtime. 240 capsule 3  . HYDROcodone-acetaminophen (NORCO/VICODIN) 5-325 MG tablet Take 1 tablet by mouth every 6 (six) hours as needed for moderate pain. 30 tablet 0  . loratadine (CLARITIN) 10 MG tablet Take 1 tablet (10 mg total) by mouth daily. 90 tablet 3  .  losartan (COZAAR) 100 MG tablet TAKE ONE TABLET BY MOUTH ONCE DAILY 90 tablet 3  . Melatonin 3 MG TABS Take 9 mg by mouth.     . mirtazapine (REMERON) 15 MG tablet Take 1 tablet (15 mg total) by mouth at bedtime. 90 tablet 0  . Multiple Vitamins-Minerals (MULTIVITAMIN WITH MINERALS) tablet Take 1 tablet by mouth daily. Reported on 06/16/2015     . nitroGLYCERIN (NITROSTAT) 0.4 MG SL tablet Place 1 tablet (0.4 mg total) under the tongue every 5 (five) minutes as needed for chest pain. (may repeat every 5 minutes but seek medical help if pain persists after 3 tablets) 25 tablet 0  . ondansetron (ZOFRAN-ODT) 4 MG disintegrating tablet DISSOLVE ONE TABLET IN MOUTH EVERY 8 HOURS AS NEEDED FOR NAUSEA OR VOMITING. 20 tablet 0  . promethazine (PHENERGAN) 25 MG tablet TAKE 1 TABLET BY MOUTH EVERY 8 HOURS AS NEEDED FOR NAUSEA OR VOMITING 30 tablet 2  . QUEtiapine (SEROQUEL) 100 MG tablet TAKE 1 TABLET BY MOUTH AT BEDTIME 90 tablet 0  . sertraline (ZOLOFT) 100 MG tablet Take 2 tablets (200 mg total) by mouth at bedtime. 180 tablet 1  . triamcinolone cream (KENALOG) 0.1 % APPLY TOPICALLY TWO TIMES DAILY 30 g 0  . vitamin B-12 (CYANOCOBALAMIN) 500 MCG tablet Take by mouth.     No current facility-administered medications for this visit.      Allergies:   Lisinopril and Milk-related compounds   Social History:  The patient  reports that she has quit smoking. she has never used smokeless tobacco. She reports that she does not drink alcohol or use drugs.   Family History:   family history includes Alcoholism in her father; Arthritis in her mother; Breast cancer (age of onset: 29) in her mother; Diabetes in her unknown relative; Heart disease in her paternal grandmother; Hyperlipidemia in her unknown relative; Hypertension in her mother; Mental illness in her unknown relative.    Review of Systems: Review of Systems  Constitutional: Negative.   Respiratory: Positive for shortness of breath.   Cardiovascular: Positive for chest pain.  Gastrointestinal: Negative.   Musculoskeletal: Negative.   Neurological: Negative.   Psychiatric/Behavioral: Negative.   All other systems reviewed and are negative.    PHYSICAL EXAM: VS:  BP 122/88 (BP Location: Right Arm, Patient Position: Sitting, Cuff Size: Large)   Pulse 81   Ht 5\' 5"  (1.651 m)   Wt  233 lb 4 oz (105.8 kg)   BMI 38.81 kg/m  , BMI Body mass index is 38.81 kg/m. GEN: Well nourished, well developed, in no acute distress , obese HEENT: normal  Neck: no JVD, carotid bruits, or masses Cardiac: RRR; no murmurs, rubs, or gallops,no edema  Respiratory:  clear to auscultation bilaterally, normal work of breathing GI: soft, nontender, nondistended, + BS MS: no deformity or atrophy  Skin: warm and dry, no rash Neuro:  Strength and sensation are intact Psych: euthymic mood, full affect   Recent Labs: 09/26/2016: ALT 15 01/06/2017: Hemoglobin 14.4; Platelets 185 02/12/2017: BUN 18; Creatinine, Ser 0.75; Potassium 4.0; Sodium 141    Lipid Panel Lab Results  Component Value Date   CHOL 260 (H) 08/30/2013   HDL 41 08/30/2013   LDLCALC 167 (H) 08/30/2013   TRIG 260 (H) 08/30/2013      Wt Readings from Last 3 Encounters:  02/24/17 233 lb 4 oz (105.8 kg)  01/24/17 237 lb 3.2 oz (107.6 kg)  01/03/17 236 lb 12.8 oz (107.4 kg)  ASSESSMENT AND PLAN:  Shortness of breath - Plan: EKG 12-Lead Atypical in nature, recent normal echocardiogram and cardiac catheterization Recommended regular walking program Limited secondary to chronic knee pain  Chest pain, unspecified type - Plan: EKG 12-Lead Atypical chest pain on the left Previous echocardiogram and cardiac catheterization results reviewed with her showing nonobstructive disease 2015 No further cardiac testing needed at this time We could consider CT scan chest if symptoms persist, recommend we wait until after she has seen GI  Type 2 diabetes mellitus without complication, without long-term current use of insulin ([place]) Sugars dramatically improved with weight loss now off all of her medications  Essential hypertension Concerned about low blood pressure after recent weight loss Recommended she decrease Coreg down to 12.5 mg daily, hold the amlodipine Additional medication decreases may be needed if she  continues to require low blood pressures at home  Sleep apnea, unspecified type Tolerating her CPAP  Status post gastric bypass for obesity Dramatic weight loss since her surgery Reports losing several pounds per week Small meal, once a day only given chronic nausea  nausea Chronic issue, worse in the morning, periodically during the daytime especially if she eats more than once Scheduled to see GI next month Recommended she talk with primary care concerning cutting back on some of her medications Reports that she takes very large amounts of Neurontin every evening  Disposition:   Follow up as needed She will call us with blood pressure numbers over the next several weeks   Total encounter time more than 60 minutes  Greater than 50% was spent in counseling and coordination of care with the patient  Patient was seen in consultation for Dr. Caryl Bis and will be referred back to his office for ongoing care of the issues detailed above   Orders Placed This Encounter  Procedures  . EKG 12-Lead     Signed, Esmond Plants, M.D., Ph.D. 02/24/2017  [place], [place]

## 2017-02-24 ENCOUNTER — Ambulatory Visit: Payer: PPO | Admitting: Cardiovascular Disease

## 2017-02-24 ENCOUNTER — Encounter: Payer: Self-pay | Admitting: Cardiovascular Disease

## 2017-02-24 ENCOUNTER — Ambulatory Visit (INDEPENDENT_AMBULATORY_CARE_PROVIDER_SITE_OTHER): Payer: PPO | Admitting: Licensed Clinical Social Worker

## 2017-02-24 VITALS — BP 122/88 | HR 81 | Ht 65.0 in | Wt 233.2 lb

## 2017-02-24 DIAGNOSIS — R0602 Shortness of breath: Secondary | ICD-10-CM

## 2017-02-24 DIAGNOSIS — I10 Essential (primary) hypertension: Secondary | ICD-10-CM

## 2017-02-24 DIAGNOSIS — G4730 Sleep apnea, unspecified: Secondary | ICD-10-CM

## 2017-02-24 DIAGNOSIS — F329 Major depressive disorder, single episode, unspecified: Secondary | ICD-10-CM

## 2017-02-24 DIAGNOSIS — F419 Anxiety disorder, unspecified: Secondary | ICD-10-CM

## 2017-02-24 DIAGNOSIS — E119 Type 2 diabetes mellitus without complications: Secondary | ICD-10-CM | POA: Diagnosis not present

## 2017-02-24 DIAGNOSIS — Z9884 Bariatric surgery status: Secondary | ICD-10-CM

## 2017-02-24 DIAGNOSIS — R079 Chest pain, unspecified: Secondary | ICD-10-CM

## 2017-02-24 MED ORDER — CARVEDILOL 12.5 MG PO TABS: 12.5000 mg | ORAL_TABLET | Freq: Two times a day (BID) | ORAL | 6 refills | Status: DC

## 2017-02-24 NOTE — Patient Instructions (Addendum)
Medication Instructions:   Please cut the coreg down to 12.5 twice a day  Stop the amlodipine  Losartan in the Am  If blood pressure continues to run low/with dizzy spells, Call the office  Monitor blood pressure  Labwork:  No new labs needed  Testing/Procedures:  No further testing at this time   Follow-Up: It was a pleasure seeing you in the office today. Please call us if you have new issues that need to be addressed before your next appt.  [phone]  Your physician wants you to follow-up in:  As needed  If you need a refill on your cardiac medications before your next appointment, please call your pharmacy.

## 2017-02-25 ENCOUNTER — Ambulatory Visit: Payer: Self-pay | Admitting: Psychology

## 2017-03-03 ENCOUNTER — Ambulatory Visit: Payer: PPO | Admitting: Gastroenterology

## 2017-03-03 NOTE — Progress Notes (Signed)
   THERAPIST PROGRESS NOTE  Session Time: 60   Participation Level: Active  Behavioral Response: CasualAlertDepressed  Type of Therapy: Individual Therapy  Treatment Goals addressed: Coping and Diagnosis: Depression  Interventions: CBT, Motivational Interviewing and Solution Focused  Summary: Denise Davis is a 51 y.o. female who presents with continued symptoms of her diagnosis. Patient reports that she is recently widowed.  She reports that she will struggle with the first year of "firsts".  She reports that she cries daily. She reports that her daughter and her boyfriend currently live with her.  She reports that she continues to be stressed out.  Patient was able to list supports such as friends and family members.  She will continue to list triggers to assist with understanding her symptoms.   Suicidal/Homicidal: No  Therapist Response: Therapist met with patient in an initial therapy session to assess current mood and to build rapport.  Therapist engaged Patient in discussion about her life and what is going well for her.  Therapist provided support as Patient shared details about her life, her current stressors, mood, coping skills, her past, and her children.  Therapist prompted Patient to discuss her support system and ways that she manages her daily stress, anger and frustrations.   Plan: Return again in 2 weeks.  Diagnosis: Axis I: Depression    Axis II: No diagnosis     M , LCSW 02/25/2017  

## 2017-03-04 ENCOUNTER — Telehealth: Payer: Self-pay | Admitting: Family Medicine

## 2017-03-04 DIAGNOSIS — F1210 Cannabis abuse, uncomplicated: Secondary | ICD-10-CM | POA: Diagnosis not present

## 2017-03-04 DIAGNOSIS — F4312 Post-traumatic stress disorder, chronic: Secondary | ICD-10-CM | POA: Diagnosis not present

## 2017-03-04 DIAGNOSIS — Z79899 Other long term (current) drug therapy: Secondary | ICD-10-CM | POA: Diagnosis not present

## 2017-03-04 DIAGNOSIS — F331 Major depressive disorder, recurrent, moderate: Secondary | ICD-10-CM | POA: Diagnosis not present

## 2017-03-04 DIAGNOSIS — F5105 Insomnia due to other mental disorder: Secondary | ICD-10-CM | POA: Diagnosis not present

## 2017-03-04 NOTE — Telephone Encounter (Signed)
faxed

## 2017-03-04 NOTE — Telephone Encounter (Signed)
Sent medical records through [place]. Release ID [id number] office note and recent labs per mr release from Dr. Waylan Boga office.

## 2017-03-04 NOTE — Telephone Encounter (Signed)
Please advise 

## 2017-03-04 NOTE — Telephone Encounter (Signed)
Copied from [place] [id number]. Topic: General - Other >> Mar 04, 2017 10:23 AM Cecelia Byars, NT wrote: Reason for [id number]  is presently at Dr Varney Biles office  for an appointment they need a current list of her medication as well as her medical condition please call [phone] fax [phone]

## 2017-03-14 DIAGNOSIS — Z79899 Other long term (current) drug therapy: Secondary | ICD-10-CM | POA: Diagnosis not present

## 2017-03-14 DIAGNOSIS — F4312 Post-traumatic stress disorder, chronic: Secondary | ICD-10-CM | POA: Diagnosis not present

## 2017-03-14 DIAGNOSIS — F1210 Cannabis abuse, uncomplicated: Secondary | ICD-10-CM | POA: Diagnosis not present

## 2017-03-14 DIAGNOSIS — F331 Major depressive disorder, recurrent, moderate: Secondary | ICD-10-CM | POA: Diagnosis not present

## 2017-03-14 DIAGNOSIS — F5105 Insomnia due to other mental disorder: Secondary | ICD-10-CM | POA: Diagnosis not present

## 2017-03-20 ENCOUNTER — Ambulatory Visit: Payer: PPO | Admitting: Licensed Clinical Social Worker

## 2017-03-24 ENCOUNTER — Telehealth: Payer: Self-pay | Admitting: Family Medicine

## 2017-03-24 MED ORDER — ONDANSETRON 4 MG PO TBDP: ORAL_TABLET | ORAL | 0 refills | Status: DC

## 2017-03-24 MED ORDER — PROMETHAZINE HCL 25 MG PO TABS: ORAL_TABLET | ORAL | 2 refills | Status: DC

## 2017-03-24 NOTE — Telephone Encounter (Signed)
Copied from [place]. Topic: Quick Communication - Rx Refill/Question >> Mar 24, 2017 10:10 AM Marin Olp L wrote: Has the patient contacted their pharmacy? No. (new req) (Agent: If no, request that the patient contact the pharmacy for the refill.) Preferred Pharmacy (with phone number or street name): [place], [place] Agent: Please be advised that RX refills may take up to 3 business days. We ask that you follow-up with your pharmacy. Patient forgot to get trazadone from Dr. Nicolasa Ducking and wants to know if Caryl Bis can send enough for the holiday?

## 2017-03-24 NOTE — Telephone Encounter (Signed)
Patient states DR.Nicolasa Ducking started her on Trazodone 100 mg take 2 at bedtime. Patient also states she is no longer on hydrocodone and mirtazapine. Patient states she has experienced SOB and heart palpitations since starting the trazodone. Informed patient if she has these symptoms she will need to be evaluated and she will need to contact Dr.Kapur. Patient would also like a refill for promethazine and Zofran.

## 2017-03-24 NOTE — Telephone Encounter (Signed)
Pt asking if Dr. Caryl Bis can send in  a prescription for Trazodone to get through the holidays. Pt states she forgot to get from Dr. Nicolasa Ducking.

## 2017-03-24 NOTE — Telephone Encounter (Signed)
Refills of promethazine and Zofran sent to pharmacy.  I agree with the CMA advising that the patient should be evaluated for her symptoms.  I also agree that she needs to contact Dr. Nicolasa Ducking regarding her trazodone.

## 2017-03-24 NOTE — Telephone Encounter (Signed)
Given the current regimen this patient is on I would not add trazodone due to potential interactions. She could contact Dr Nicolasa Ducking to see if there is something she would add. Thanks.

## 2017-03-24 NOTE — Telephone Encounter (Signed)
Please advise 

## 2017-04-07 ENCOUNTER — Ambulatory Visit: Payer: PPO | Admitting: Gastroenterology

## 2017-04-07 ENCOUNTER — Other Ambulatory Visit: Payer: Self-pay

## 2017-04-07 ENCOUNTER — Encounter: Payer: Self-pay | Admitting: Gastroenterology

## 2017-04-07 DIAGNOSIS — R112 Nausea with vomiting, unspecified: Secondary | ICD-10-CM

## 2017-04-07 NOTE — Progress Notes (Addendum)
Primary Care Physician: Leone Haven, MD  Primary Gastroenterologist:  Dr. Lucilla Lame  No chief complaint on file.   HPI: Denise Davis is a 52 y.o. female here for follow-up with a history of chest pain and GERD and an EGD showing gastric bypass. The patient underwent esophageal manometry and 24-hour pH probe studies that showed the lower esophageal sphincter pressures to be low but no sign of any pathological acid reflux. The patient has a history of chronic nausea. She states that her nausea is made worse when she smells eggs and when she eats fatty foods. She also reports that she would have diarrhea every time she drank almond milk with lower abdominal cramps. She states that the diarrhea would happen 10 minutes after eating. The patient recently lost her husband within the last year. The patient also has lost approximately 20 pounds since she last saw me in February 2018. She has not had any further bouts of diarrhea with cramps since she stopped the common milk. She continues to have the nausea.   Current Outpatient Medications  Medication Sig Dispense Refill  . amoxicillin-clavulanate (AUGMENTIN) 875-125 MG tablet Take 1 tablet by mouth. For dental    . calcium-vitamin D 250-100 MG-UNIT tablet Take 1 tablet by mouth 2 (two) times daily. Reported on 06/16/2015 180 tablet 3  . carvedilol (COREG) 12.5 MG tablet Take 1 tablet (12.5 mg total) by mouth 2 (two) times daily with a meal. 60 tablet 6  . Cholecalciferol (VITAMIN D-3) 1000 units CAPS Take 1 capsule (1,000 Units total) by mouth daily. Reported on 06/16/2015 90 capsule 3  . dexlansoprazole (DEXILANT) 60 MG capsule Take 1 capsule (60 mg total) by mouth daily. 30 capsule 6  . gabapentin (NEURONTIN) 300 MG capsule Take 8 capsules (2,400 mg total) by mouth at bedtime. 240 capsule 3  . HYDROcodone-acetaminophen (NORCO/VICODIN) 5-325 MG tablet Take 1 tablet by mouth every 6 (six) hours as needed for moderate pain. 30 tablet 0  .  loratadine (CLARITIN) 10 MG tablet Take 1 tablet (10 mg total) by mouth daily. 90 tablet 3  . losartan (COZAAR) 100 MG tablet TAKE ONE TABLET BY MOUTH ONCE DAILY 90 tablet 3  . Melatonin 3 MG TABS Take 9 mg by mouth.     . mirtazapine (REMERON) 15 MG tablet Take 1 tablet (15 mg total) by mouth at bedtime. 90 tablet 0  . Multiple Vitamins-Minerals (MULTIVITAMIN WITH MINERALS) tablet Take 1 tablet by mouth daily. Reported on 06/16/2015    . nitroGLYCERIN (NITROSTAT) 0.4 MG SL tablet Place 1 tablet (0.4 mg total) under the tongue every 5 (five) minutes as needed for chest pain. (may repeat every 5 minutes but seek medical help if pain persists after 3 tablets) 25 tablet 0  . ondansetron (ZOFRAN-ODT) 4 MG disintegrating tablet DISSOLVE ONE TABLET IN MOUTH EVERY 8 HOURS AS NEEDED FOR NAUSEA OR VOMITING. 20 tablet 0  . promethazine (PHENERGAN) 25 MG tablet TAKE 1 TABLET BY MOUTH EVERY 8 HOURS AS NEEDED FOR NAUSEA OR VOMITING 30 tablet 2  . QUEtiapine (SEROQUEL) 100 MG tablet TAKE 1 TABLET BY MOUTH AT BEDTIME 90 tablet 0  . sertraline (ZOLOFT) 100 MG tablet Take 2 tablets (200 mg total) by mouth at bedtime. 180 tablet 1  . traZODone (DESYREL) 100 MG tablet Take 200 mg by mouth at bedtime.    . triamcinolone cream (KENALOG) 0.1 % APPLY TOPICALLY TWO TIMES DAILY 30 g 0  . vitamin B-12 (CYANOCOBALAMIN) 500 MCG tablet  Take by mouth.     No current facility-administered medications for this visit.     Allergies as of 04/07/2017 - Review Complete 02/24/2017  Allergen Reaction Noted  . Lisinopril Cough 11/03/2014  . Milk-related compounds Diarrhea and Nausea And Vomiting 05/09/2011    ROS:  General: Negative for anorexia, weight loss, fever, chills, fatigue, weakness. [name]: Negative for hoarseness, difficulty swallowing , nasal congestion. CV: Negative for chest pain, angina, palpitations, dyspnea on exertion, peripheral edema.  Respiratory: Negative for dyspnea at rest, dyspnea on exertion, cough,  sputum, wheezing.  GI: See history of present illness. GU:  Negative for dysuria, hematuria, urinary incontinence, urinary frequency, nocturnal urination.  Endo: Negative for unusual weight change.    Physical Examination:   There were no vitals taken for this visit.  General: Well-nourished, well-developed in no acute distress.  Eyes: No icterus. Conjunctivae pink. Mouth: Oropharyngeal mucosa moist and pink , no lesions erythema or exudate. Lungs: Clear to auscultation bilaterally. Non-labored. Heart: Regular rate and rhythm, no murmurs rubs or gallops.  Abdomen: Bowel sounds are normal, nontender, nondistended, no hepatosplenomegaly or masses, no abdominal bruits or hernia , no rebound or guarding.   Extremities: No lower extremity edema. No clubbing or deformities. Neuro: Alert and oriented x 3.  Grossly intact. Skin: Warm and dry, no jaundice.   Psych: Alert and cooperative, normal mood and affect.  Labs:    Imaging Studies: No results found.  Assessment and Plan:   Denise Davis is a 52 y.o. y/o female who has had chronic nausea. The patient states that smells will typically cause her nausea to start. She also has been taking anti emetic medication which has slightly helped. The patient has lost 20 pounds. Her abdominal cramps and diarrhea shortly after drinking almond milk completely resolved when she stopped the almond milk. She continues to have the chronic nausea and reports it is worse with greasy foods. The patient will be set up for an EGD to rule out a anastomotic ulcer. The patient will also be set up for a right upper quadrant ultrasound with a HIDA scan to make sure her gallbladder is not the cause of her present symptoms. The patient has been explained the plan and agrees with it.    Lucilla Lame, MD. [facility]   Note: This dictation was prepared with Dragon dictation along with smaller phrase technology. Any transcriptional errors that result from this process are  unintentional.

## 2017-04-07 NOTE — Patient Instructions (Addendum)
You are scheduled for a RUQ abdominal US/HIDA (gallbladder emptying study) at [facility] on Monday, Jan 21st at 9:00am.  Please arrive at 8:45am and check in at the [facility] mall registration desk. You cannot have anything to eat or drink after midnight Sunday night.    Please refrain from taking any pain medications 6 hours prior to scan.   If you need to reschedule this appointment for any reason, please contact central scheduling at [phone].

## 2017-04-08 ENCOUNTER — Other Ambulatory Visit: Payer: Self-pay

## 2017-04-08 ENCOUNTER — Encounter: Payer: Self-pay | Admitting: *Deleted

## 2017-04-09 NOTE — Discharge Instructions (Signed)
General Anesthesia, Adult, Care After °These instructions provide you with information about caring for yourself after your procedure. Your health care provider may also give you more specific instructions. Your treatment has been planned according to current medical practices, but problems sometimes occur. Call your health care provider if you have any problems or questions after your procedure. °What can I expect after the procedure? °After the procedure, it is common to have: °· Vomiting. °· A sore throat. °· Mental slowness. ° °It is common to feel: °· Nauseous. °· Cold or shivery. °· Sleepy. °· Tired. °· Sore or achy, even in parts of your body where you did not have surgery. ° °Follow these instructions at home: °For at least 24 hours after the procedure: °· Do not: °? Participate in activities where you could fall or become injured. °? Drive. °? Use heavy machinery. °? Drink alcohol. °? Take sleeping pills or medicines that cause drowsiness. °? Make important decisions or sign legal documents. °? Take care of children on your own. °· Rest. °Eating and drinking °· If you vomit, drink water, juice, or soup when you can drink without vomiting. °· Drink enough fluid to keep your urine clear or pale yellow. °· Make sure you have little or no nausea before eating solid foods. °· Follow the diet recommended by your health care provider. °General instructions °· Have a responsible adult stay with you until you are awake and alert. °· Return to your normal activities as told by your health care provider. Ask your health care provider what activities are safe for you. °· Take over-the-counter and prescription medicines only as told by your health care provider. °· If you smoke, do not smoke without supervision. °· Keep all follow-up visits as told by your health care provider. This is important. °Contact a health care provider if: °· You continue to have nausea or vomiting at home, and medicines are not helpful. °· You  cannot drink fluids or start eating again. °· You cannot urinate after 8-12 hours. °· You develop a skin rash. °· You have fever. °· You have increasing redness at the site of your procedure. °Get help right away if: °· You have difficulty breathing. °· You have chest pain. °· You have unexpected bleeding. °· You feel that you are having a life-threatening or urgent problem. °This information is not intended to replace advice given to you by your health care provider. Make sure you discuss any questions you have with your health care provider. °Document Released: 06/24/2000 Document Revised: 08/21/2015 Document Reviewed: 03/02/2015 °Elsevier Interactive Patient Education © 2018 [employer] ° °

## 2017-04-10 ENCOUNTER — Encounter
Admission: RE | Disposition: A | Discharge status: Home / Self Care | Payer: Self-pay | Source: Ambulatory Visit | Attending: Gastroenterology

## 2017-04-10 ENCOUNTER — Ambulatory Visit: Payer: PPO | Admitting: Anesthesiology

## 2017-04-10 ENCOUNTER — Ambulatory Visit
Admission: RE | Admit: 2017-04-10 | Discharge: 2017-04-10 | Disposition: A | Discharge status: Home / Self Care | Payer: PPO | Source: Ambulatory Visit | Attending: Gastroenterology | Admitting: Gastroenterology

## 2017-04-10 DIAGNOSIS — F329 Major depressive disorder, single episode, unspecified: Secondary | ICD-10-CM | POA: Insufficient documentation

## 2017-04-10 DIAGNOSIS — G4730 Sleep apnea, unspecified: Secondary | ICD-10-CM | POA: Diagnosis not present

## 2017-04-10 DIAGNOSIS — R110 Nausea: Secondary | ICD-10-CM | POA: Insufficient documentation

## 2017-04-10 DIAGNOSIS — Z87891 Personal history of nicotine dependence: Secondary | ICD-10-CM | POA: Diagnosis not present

## 2017-04-10 DIAGNOSIS — Z9884 Bariatric surgery status: Secondary | ICD-10-CM | POA: Insufficient documentation

## 2017-04-10 DIAGNOSIS — Z6837 Body mass index (BMI) 37.0-37.9, adult: Secondary | ICD-10-CM | POA: Insufficient documentation

## 2017-04-10 DIAGNOSIS — Z85528 Personal history of other malignant neoplasm of kidney: Secondary | ICD-10-CM | POA: Diagnosis not present

## 2017-04-10 DIAGNOSIS — F419 Anxiety disorder, unspecified: Secondary | ICD-10-CM | POA: Diagnosis not present

## 2017-04-10 DIAGNOSIS — Z79899 Other long term (current) drug therapy: Secondary | ICD-10-CM | POA: Insufficient documentation

## 2017-04-10 DIAGNOSIS — E785 Hyperlipidemia, unspecified: Secondary | ICD-10-CM | POA: Insufficient documentation

## 2017-04-10 DIAGNOSIS — Z888 Allergy status to other drugs, medicaments and biological substances status: Secondary | ICD-10-CM | POA: Insufficient documentation

## 2017-04-10 DIAGNOSIS — Z91011 Allergy to milk products: Secondary | ICD-10-CM | POA: Insufficient documentation

## 2017-04-10 DIAGNOSIS — I10 Essential (primary) hypertension: Secondary | ICD-10-CM | POA: Diagnosis not present

## 2017-04-10 DIAGNOSIS — Z86718 Personal history of other venous thrombosis and embolism: Secondary | ICD-10-CM | POA: Diagnosis not present

## 2017-04-10 DIAGNOSIS — K228 Other specified diseases of esophagus: Secondary | ICD-10-CM | POA: Diagnosis not present

## 2017-04-10 DIAGNOSIS — Z8249 Family history of ischemic heart disease and other diseases of the circulatory system: Secondary | ICD-10-CM | POA: Insufficient documentation

## 2017-04-10 DIAGNOSIS — E1151 Type 2 diabetes mellitus with diabetic peripheral angiopathy without gangrene: Secondary | ICD-10-CM | POA: Diagnosis not present

## 2017-04-10 DIAGNOSIS — K219 Gastro-esophageal reflux disease without esophagitis: Secondary | ICD-10-CM | POA: Diagnosis not present

## 2017-04-10 DIAGNOSIS — Z9989 Dependence on other enabling machines and devices: Secondary | ICD-10-CM | POA: Diagnosis not present

## 2017-04-10 DIAGNOSIS — G40909 Epilepsy, unspecified, not intractable, without status epilepticus: Secondary | ICD-10-CM | POA: Insufficient documentation

## 2017-04-10 DIAGNOSIS — Z96653 Presence of artificial knee joint, bilateral: Secondary | ICD-10-CM | POA: Insufficient documentation

## 2017-04-10 HISTORY — PX: ESOPHAGOGASTRODUODENOSCOPY (EGD) WITH PROPOFOL: SHX5813

## 2017-04-10 SURGERY — ESOPHAGOGASTRODUODENOSCOPY (EGD) WITH PROPOFOL
Anesthesia: General | Site: Esophagus | Wound class: Clean Contaminated

## 2017-04-10 MED ORDER — PROMETHAZINE HCL 25 MG/ML IJ SOLN: 6.2500 mg | INTRAMUSCULAR | Status: DC | PRN

## 2017-04-10 MED ORDER — LIDOCAINE HCL (CARDIAC) 20 MG/ML IV SOLN
INTRAVENOUS | Status: DC | PRN
  Administered 2017-04-10: 30 mg via INTRAVENOUS
  Administered 2017-04-10: 20 mg via INTRAVENOUS

## 2017-04-10 MED ORDER — OXYCODONE HCL 5 MG/5ML PO SOLN: 5.0000 mg | Freq: Once | ORAL | Status: DC | PRN

## 2017-04-10 MED ORDER — PROPOFOL 10 MG/ML IV BOLUS
INTRAVENOUS | Status: DC | PRN
  Administered 2017-04-10: 150 mg via INTRAVENOUS

## 2017-04-10 MED ORDER — MEPERIDINE HCL 25 MG/ML IJ SOLN: 6.2500 mg | INTRAMUSCULAR | Status: DC | PRN

## 2017-04-10 MED ORDER — FENTANYL CITRATE (PF) 100 MCG/2ML IJ SOLN: 25.0000 ug | INTRAMUSCULAR | Status: DC | PRN

## 2017-04-10 MED ORDER — GLYCOPYRROLATE 0.2 MG/ML IJ SOLN
INTRAMUSCULAR | Status: DC | PRN
  Administered 2017-04-10: 0.1 mg via INTRAVENOUS

## 2017-04-10 MED ORDER — LACTATED RINGERS IV SOLN
10.0000 mL/h | INTRAVENOUS | Status: DC
  Administered 2017-04-10: 09:00:00 via INTRAVENOUS

## 2017-04-10 MED ORDER — OXYCODONE HCL 5 MG PO TABS: 5.0000 mg | ORAL_TABLET | Freq: Once | ORAL | Status: DC | PRN

## 2017-04-10 SURGICAL SUPPLY — 33 items
BALLN DILATOR 10-12 8 (BALLOONS)
BALLN DILATOR 12-15 8 (BALLOONS)
BALLN DILATOR 15-18 8 (BALLOONS)
BALLN DILATOR CRE 0-12 8 (BALLOONS)
BALLN DILATOR ESOPH 8 10 CRE (MISCELLANEOUS) IMPLANT
BALLOON DILATOR 12-15 8 (BALLOONS) IMPLANT
BALLOON DILATOR 15-18 8 (BALLOONS) IMPLANT
BALLOON DILATOR CRE 0-12 8 (BALLOONS) IMPLANT
BLOCK BITE 60FR ADLT L/F GRN (MISCELLANEOUS) ×3 IMPLANT
CANISTER SUCT 1200ML W/VALVE (MISCELLANEOUS) ×3 IMPLANT
CLIP HMST 235XBRD CATH ROT (MISCELLANEOUS) IMPLANT
CLIP RESOLUTION 360 11X235 (MISCELLANEOUS)
ELECT REM PT RETURN 9FT ADLT (ELECTROSURGICAL)
ELECTRODE REM PT RTRN 9FT ADLT (ELECTROSURGICAL) IMPLANT
FCP ESCP3.2XJMB 240X2.8X (MISCELLANEOUS)
FORCEPS BIOP RAD 4 LRG CAP 4 (CUTTING FORCEPS) IMPLANT
FORCEPS BIOP RJ4 240 W/NDL (MISCELLANEOUS)
FORCEPS ESCP3.2XJMB 240X2.8X (MISCELLANEOUS) IMPLANT
GOWN CVR UNV OPN BCK APRN NK (MISCELLANEOUS) ×2 IMPLANT
GOWN ISOL THUMB LOOP REG UNIV (MISCELLANEOUS) ×6
INJECTOR VARIJECT VIN23 (MISCELLANEOUS) IMPLANT
KIT DEFENDO VALVE AND CONN (KITS) IMPLANT
KIT ENDO PROCEDURE OLY (KITS) ×3 IMPLANT
MARKER SPOT ENDO TATTOO 5ML (MISCELLANEOUS) IMPLANT
RETRIEVER NET PLAT FOOD (MISCELLANEOUS) IMPLANT
SNARE SHORT THROW 13M SML OVAL (MISCELLANEOUS) IMPLANT
SNARE SHORT THROW 30M LRG OVAL (MISCELLANEOUS) IMPLANT
SPOT EX ENDOSCOPIC TATTOO (MISCELLANEOUS)
SYR INFLATION 60ML (SYRINGE) IMPLANT
TRAP ETRAP POLY (MISCELLANEOUS) IMPLANT
VARIJECT INJECTOR VIN23 (MISCELLANEOUS)
WATER STERILE IRR 250ML POUR (IV SOLUTION) ×3 IMPLANT
WIRE CRE 18-20MM 8CM F G (MISCELLANEOUS) IMPLANT

## 2017-04-10 NOTE — Anesthesia Procedure Notes (Addendum)
Date/Time: 04/10/2017 9:02 AM Performed by: Cameron Ali, CRNA Pre-anesthesia Checklist: Patient identified, Emergency Drugs available, Suction available, Timeout performed and Patient being monitored Patient Re-evaluated:Patient Re-evaluated prior to induction Oxygen Delivery Method: Nasal cannula Placement Confirmation: positive ETCO2

## 2017-04-10 NOTE — Anesthesia Postprocedure Evaluation (Signed)
Anesthesia Post Note  Patient: Denise Davis  Procedure(s) Performed: ESOPHAGOGASTRODUODENOSCOPY (EGD) WITH PROPOFOL (N/A Esophagus)  Patient location during evaluation: PACU Anesthesia Type: General Level of consciousness: awake and alert Pain management: pain level controlled Vital Signs Assessment: post-procedure vital signs reviewed and stable Respiratory status: spontaneous breathing, nonlabored ventilation, respiratory function stable and patient connected to nasal cannula oxygen Cardiovascular status: blood pressure returned to baseline and stable Postop Assessment: no apparent nausea or vomiting Anesthetic complications: no    SCOURAS, NICOLE ELAINE

## 2017-04-10 NOTE — H&P (Signed)
Lucilla Lame, MD [facility] [address], [place],  [address] [phone] Fax : [phone]  Primary Care Physician:  Leone Haven, MD Primary Gastroenterologist:  Dr. Allen Norris  Pre-Procedure History & Physical: HPI:  Denise Davis is a 52 y.o. female is here for an endoscopy.   Past Medical History:  Diagnosis Date  . Allergic rhinitis   . Anxiety   . Depression   . Diabetes mellitus   . DVT of leg (deep venous thrombosis) ([place]) 12/2007   left leg, given warfarin for 6 mos  . GERD (gastroesophageal reflux disease)   . H/O hiatal hernia   . Hyperlipidemia   . Hypertension   . Morbid obesity ([place])   . Pericarditis    Diagnosed/treated at [facility]; per patient 4 episodes per cardiologist Dr. Edwin Dada persistant chest pain unclear if pericarditis verusus neuropsychogenic   . Renal cell cancer ([place]) 2009   S/p nephrectomy (left)  . Seizure disorder ([place])    (04/08/17)- None in over 1 yr, no meds.    Past Surgical History:  Procedure Laterality Date  . ENDOMETRIAL ABLATION  2007  . ESOPHAGEAL MANOMETRY N/A 05/21/2016   Procedure: ESOPHAGEAL MANOMETRY (EM);  Surgeon: Lucilla Lame, MD;  Location: [facility] ENDOSCOPY;  Service: Endoscopy;  Laterality: N/A;  . ESOPHAGOGASTRODUODENOSCOPY (EGD) WITH PROPOFOL N/A 05/14/2016   Procedure: ESOPHAGOGASTRODUODENOSCOPY (EGD) WITH PROPOFOL;  Surgeon: Lucilla Lame, MD;  Location: [facility] ENDOSCOPY;  Service: Endoscopy;  Laterality: N/A;  . INSERTION OF MESH  2014  . LEFT HEART CATHETERIZATION WITH CORONARY ANGIOGRAM N/A 08/30/2013   Procedure: LEFT HEART CATHETERIZATION WITH CORONARY ANGIOGRAM;  Surgeon: Troy Sine, MD;  Location: [facility] CATH LAB;  Service: Cardiovascular;  Laterality: N/A;  . NEPHRECTOMY    . REPLACEMENT TOTAL KNEE BILATERAL Bilateral 2018   2/18, 5/18 [facility]  . ROUX-EN-Y GASTRIC BYPASS  04/27/2015   [facility]  . TUBAL LIGATION    . UMBILICAL HERNIA REPAIR      Prior to Admission medications   Medication Sig Start Date  End Date Taking? Authorizing Provider  carvedilol (COREG) 12.5 MG tablet Take 1 tablet (12.5 mg total) by mouth 2 (two) times daily with a meal. 02/24/17  Yes Gollan, Kathlene November, MD  dexlansoprazole (DEXILANT) 60 MG capsule Take 1 capsule (60 mg total) by mouth daily. 09/26/16  Yes Leone Haven, MD  gabapentin (NEURONTIN) 300 MG capsule Take 8 capsules (2,400 mg total) by mouth at bedtime. Patient taking differently: Take 2,400 mg by mouth at bedtime. Takes 600 AM, 1200 PM 09/26/16  Yes Leone Haven, MD  loratadine (CLARITIN) 10 MG tablet Take 1 tablet (10 mg total) by mouth daily. 02/12/16  Yes Leone Haven, MD  losartan (COZAAR) 100 MG tablet TAKE ONE TABLET BY MOUTH ONCE DAILY 02/17/17  Yes Leone Haven, MD  Melatonin 3 MG TABS Take 9 mg by mouth.  09/20/15  Yes [provider]  mirtazapine (REMERON) 15 MG tablet Take 1 tablet (15 mg total) by mouth at bedtime. 01/10/17  Yes Leone Haven, MD  Multiple Vitamins-Minerals (MULTIVITAMIN WITH MINERALS) tablet Take 1 tablet by mouth daily. Reported on 06/16/2015   Yes [provider]  nitroGLYCERIN (NITROSTAT) 0.4 MG SL tablet Place 1 tablet (0.4 mg total) under the tongue every 5 (five) minutes as needed for chest pain. (may repeat every 5 minutes but seek medical help if pain persists after 3 tablets) 04/23/16  Yes Leone Haven, MD  ondansetron (ZOFRAN-ODT) 4 MG disintegrating tablet DISSOLVE  ONE TABLET IN MOUTH EVERY 8 HOURS AS NEEDED FOR NAUSEA OR VOMITING. 03/24/17  Yes Leone Haven, MD  promethazine (PHENERGAN) 25 MG tablet TAKE 1 TABLET BY MOUTH EVERY 8 HOURS AS NEEDED FOR NAUSEA OR VOMITING 03/24/17  Yes Leone Haven, MD  QUEtiapine (SEROQUEL) 100 MG tablet TAKE 1 TABLET BY MOUTH AT BEDTIME 02/17/17  Yes Leone Haven, MD  sertraline (ZOLOFT) 100 MG tablet Take 2 tablets (200 mg total) by mouth at bedtime. 02/12/16  Yes Leone Haven, MD  traZODone (DESYREL) 100 MG tablet Take  200 mg by mouth at bedtime.   Yes [provider]  triamcinolone cream (KENALOG) 0.1 % APPLY TOPICALLY TWO TIMES DAILY 02/17/17  Yes Leone Haven, MD  amoxicillin-clavulanate (AUGMENTIN) 875-125 MG tablet Take 1 tablet by mouth. For dental 09/26/16   [provider]  calcium-vitamin D 250-100 MG-UNIT tablet Take 1 tablet by mouth 2 (two) times daily. Reported on 06/16/2015 Patient not taking: Reported on 04/07/2017 10/02/15   Leone Haven, MD  Cholecalciferol (VITAMIN D-3) 1000 units CAPS Take 1 capsule (1,000 Units total) by mouth daily. Reported on 06/16/2015 Patient not taking: Reported on 04/07/2017 10/02/15   Leone Haven, MD  HYDROcodone-acetaminophen (NORCO/VICODIN) 5-325 MG tablet Take 1 tablet by mouth every 6 (six) hours as needed for moderate pain. Patient not taking: Reported on 04/08/2017 02/12/16   Leone Haven, MD  vitamin B-12 (CYANOCOBALAMIN) 500 MCG tablet Take by mouth. 10/02/15   [provider]    Allergies as of 04/07/2017 - Review Complete 04/07/2017  Allergen Reaction Noted  . Lisinopril Cough 11/03/2014  . Milk-related compounds Diarrhea and Nausea And Vomiting 05/09/2011    Family History  Problem Relation Age of Onset  . Hypertension Mother   . Breast cancer Mother 67  . Arthritis Mother   . Heart disease Paternal Grandmother   . Alcoholism Father   . Hyperlipidemia Unknown        Parent  . Mental illness Unknown        Parent  . Diabetes Unknown        Parent    Social History   Socioeconomic History  . Marital status: Widowed    Spouse name: Not on file  . Number of children: Not on file  . Years of education: Not on file  . Highest education level: Not on file  Social Needs  . Financial resource strain: Not on file  . Food insecurity - worry: Not on file  . Food insecurity - inability: Not on file  . Transportation needs - medical: Not on file  . Transportation needs - non-medical: Not on file    Occupational History  . Not on file  Tobacco Use  . Smoking status: Former Smoker    Types: Cigarettes    Last attempt to quit: 1984    Years since quitting: 35.0  . Smokeless tobacco: Never Used  Substance and Sexual Activity  . Alcohol use: Yes    Comment: 1 glass wine/month  . Drug use: No  . Sexual activity: Not Currently  Other Topics Concern  . 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 Neck:  Supple; no masses or thyromegaly. Lungs:  Clear throughout to auscultation.    Heart:  Regular rate and rhythm. Abdomen:  Soft, nontender and nondistended. Normal bowel sounds, without guarding, and without rebound.   Neurologic:  Alert and  oriented x4;  grossly normal neurologically.  Impression/Plan: Denise Davis is here for an endoscopy to be performed for nausea  Risks, benefits, limitations, and alternatives regarding  endoscopy have been reviewed with the patient.  Questions have been answered.  All parties agreeable.   Lucilla Lame, MD  04/10/2017, 7:58 AM

## 2017-04-10 NOTE — Anesthesia Preprocedure Evaluation (Signed)
Anesthesia Evaluation  Patient identified by MRN, date of birth, ID band Patient awake    Reviewed: Allergy & Precautions, NPO status , Patient's Chart, lab work & pertinent test results  Airway Mallampati: II       Dental  (+) Teeth Intact   Pulmonary shortness of breath, sleep apnea and Continuous Positive Airway Pressure Ventilation , former smoker,     + decreased breath sounds      Cardiovascular Exercise Tolerance: Good hypertension, Pt. on medications and Pt. on home beta blockers + Peripheral Vascular Disease   Rhythm:Regular     Neuro/Psych Seizures -,  Anxiety Depression    GI/Hepatic Neg liver ROS, hiatal hernia, GERD  Medicated,  Endo/Other  diabetes  Renal/GU Renal disease     Musculoskeletal   Abdominal (+) + obese,   Peds  Hematology   Anesthesia Other Findings   Reproductive/Obstetrics                             Anesthesia Physical  Anesthesia Plan  ASA: III  Anesthesia Plan: General   Post-op Pain Management:    Induction: Intravenous  PONV Risk Score and Plan:   Airway Management Planned: Natural Airway and Nasal Cannula  Additional Equipment:   Intra-op Plan:   Post-operative Plan:   Informed Consent: I have reviewed the patients History and Physical, chart, labs and discussed the procedure including the risks, benefits and alternatives for the proposed anesthesia with the patient or authorized representative who has indicated his/her understanding and acceptance.     Plan Discussed with: Surgeon  Anesthesia Plan Comments:         Anesthesia Quick Evaluation

## 2017-04-10 NOTE — Transfer of Care (Signed)
Immediate Anesthesia Transfer of Care Note  Patient: Denise Davis  Procedure(s) Performed: ESOPHAGOGASTRODUODENOSCOPY (EGD) WITH PROPOFOL (N/A Esophagus)  Patient Location: PACU  Anesthesia Type: General  Level of Consciousness: awake, alert  and patient cooperative  Airway and Oxygen Therapy: Patient Spontanous Breathing and Patient connected to supplemental oxygen  Post-op Assessment: Post-op Vital signs reviewed, Patient's Cardiovascular Status Stable, Respiratory Function Stable, Patent Airway and No signs of Nausea or vomiting  Post-op Vital Signs: Reviewed and stable  Complications: No apparent anesthesia complications

## 2017-04-10 NOTE — Op Note (Addendum)
[facility] Patient Name: Denise Davis Procedure Date: 04/10/2017 9:01 AM MRN: [id number] Account #: [id number] Date of Birth: [birth year] Admit Type: Outpatient Age: 52 Room: [facility] OR ROOM 01 Gender: Female Note Status: Supervisor Override Procedure:            Upper GI endoscopy Indications:          Nausea Providers:            Lucilla Lame MD, MD Referring MD:         Angela Adam. Caryl Bis (Referring MD) Medicines:            Propofol per Anesthesia Complications:        No immediate complications. Procedure:            Pre-Anesthesia Assessment:                       - Prior to the procedure, a History and Physical was                        performed, and patient medications and allergies were                        reviewed. The patient's tolerance of previous                        anesthesia was also reviewed. The risks and benefits of                        the procedure and the sedation options and risks were                        discussed with the patient. All questions were                        answered, and informed consent was obtained. Prior                        Anticoagulants: The patient has taken no previous                        anticoagulant or antiplatelet agents. ASA Grade                        Assessment: II - A patient with mild systemic disease.                        After reviewing the risks and benefits, the patient was                        deemed in satisfactory condition to undergo the                        procedure.                       After obtaining informed consent, the endoscope was                        passed under direct vision. Throughout the procedure,  the patient's blood pressure, pulse, and oxygen                        saturations were monitored continuously. The Olympus                        [id number]-HQ190 Endoscope (S#. [id number]) was introduced   through the mouth, and advanced to the second part of                        duodenum. The upper GI endoscopy was accomplished                        without difficulty. The patient tolerated the procedure                        well. Findings:      The Z-line was irregular and was found at the gastroesophageal junction.      Evidence of a gastric bypass was found. A gastric pouch with a normal       size was found. The staple line appeared intact. The gastrojejunal       anastomosis was characterized by healthy appearing mucosa.      The examined jejunum was normal. Impression:           - Z-line irregular, at the gastroesophageal junction.                       - Gastric bypass with a normal-sized pouch and intact                        staple line. Gastrojejunal anastomosis characterized by                        healthy appearing mucosa.                       - Normal examined jejunum.                       - No specimens collected. Recommendation:       - Discharge patient to home.                       - Resume previous diet.                       - Continue present medications. Procedure Code(s):    --- Professional ---                       [phone], Esophagogastroduodenoscopy, flexible, transoral;                        diagnostic, including collection of specimen(s) by                        brushing or washing, when performed (separate procedure) Diagnosis Code(s):    --- Professional ---                       R11.0, Nausea  Z98.84, Bariatric surgery status                       K22.8, Other specified diseases of esophagus CPT copyright 2018 American Medical Association. All rights reserved. The codes documented in this report are preliminary and upon coder review may  be revised to meet current compliance requirements. Lucilla Lame MD, MD 04/10/2017 9:14:28 AM This report has been signed electronically. Number of Addenda: 0 Note Initiated On: 04/10/2017  9:01 AM      [facility]

## 2017-04-10 NOTE — Addendum Note (Signed)
Addendum  created 04/10/17 2637 by Cameron Ali, CRNA   Intraprocedure Blocks edited, Sign clinical note

## 2017-04-11 ENCOUNTER — Other Ambulatory Visit: Payer: Self-pay

## 2017-04-11 MED ORDER — ONDANSETRON 4 MG PO TBDP: ORAL_TABLET | ORAL | 3 refills | Status: DC

## 2017-04-14 ENCOUNTER — Telehealth: Payer: Self-pay

## 2017-04-14 NOTE — Telephone Encounter (Signed)
Patients call has been returned complaining of sore throat and chest congestions.  Patient stated that she has been experiencing palpitations since her EGD on the 11th, unable to swallow fluids, and diarrhea, nausea.  No symptoms of  Chest pain, no fever, no vomiting, no black stools.  She has been advised to go to the ER for evaluation.

## 2017-04-15 ENCOUNTER — Ambulatory Visit: Payer: Self-pay | Admitting: Neurology

## 2017-04-21 ENCOUNTER — Ambulatory Visit
Admission: RE | Admit: 2017-04-21 | Discharge: 2017-04-21 | Disposition: A | Discharge status: Home / Self Care | Payer: PPO | Source: Ambulatory Visit | Attending: Gastroenterology | Admitting: Gastroenterology

## 2017-04-21 DIAGNOSIS — R109 Unspecified abdominal pain: Secondary | ICD-10-CM | POA: Diagnosis not present

## 2017-04-21 DIAGNOSIS — K8020 Calculus of gallbladder without cholecystitis without obstruction: Secondary | ICD-10-CM | POA: Insufficient documentation

## 2017-04-21 DIAGNOSIS — R112 Nausea with vomiting, unspecified: Secondary | ICD-10-CM | POA: Diagnosis not present

## 2017-04-21 MED ORDER — TECHNETIUM TC 99M MEBROFENIN IV KIT
5.0000 | PACK | Freq: Once | INTRAVENOUS | Status: AC | PRN
  Administered 2017-04-21: 5.31 via INTRAVENOUS

## 2017-04-22 ENCOUNTER — Encounter: Payer: Self-pay | Admitting: Gastroenterology

## 2017-04-22 ENCOUNTER — Ambulatory Visit: Payer: PPO | Admitting: Gastroenterology

## 2017-04-22 ENCOUNTER — Telehealth: Payer: Self-pay

## 2017-04-22 NOTE — Telephone Encounter (Signed)
Pt notified of US/HIDA scan results. Referral has been submitted to [facility] for consultation.

## 2017-04-22 NOTE — Telephone Encounter (Signed)
-----   Message from Lucilla Lame, MD sent at 04/22/2017  8:44 AM EST ----- The patient know that her gallbladder emptying study showed the ejection fraction to be decreased and the patient should see a surgeon for a cholecystectomy. [place] [facility] is an option if she is agreeable.

## 2017-04-30 ENCOUNTER — Ambulatory Visit: Payer: Self-pay | Admitting: Surgery

## 2017-04-30 DIAGNOSIS — K8010 Calculus of gallbladder with chronic cholecystitis without obstruction: Secondary | ICD-10-CM | POA: Diagnosis not present

## 2017-04-30 NOTE — H&P (View-Only) (Signed)
Denise Davis Documented: 04/30/2017 2:57 PM Location: [place] Office Patient #: [id number] DOB: 1965-11-30 Married / Language: English / Race: Black or African American Female   History of Present Illness [name] Done MD; 04/30/2017 3:23 PM) The patient is a 52 year old female who presents for evaluation of gall stones. She had a Roux-en-Y gastric bypass by [name] in 2016. she had some gastric outlet obstruction issues and had a feeding tube placed through her remnant. This is at this point. Recently she's been having some severe right upper quadrant pain going into her back sometimes associated with diarrhea. She had a gallbladder ultrasound showing gallstones and a HIDA scan that emptied but she had a low ejection fraction.  I reviewed her HIDA scan and her ultrasound which were done at [facility] and with her symptoms it would be good to do laparoscopy and remove her gallbladder but also run her small bowel and look for an internal hernia. I will try to set this up on Friday after another gallbladder and see if we can follow and get this done sooner than later.   Allergies Malachi Bonds, CMA; 04/30/2017 2:58 PM) Lisinopril *ANTIHYPERTENSIVES*   Medication History (Chemira Jones, CMA; 04/30/2017 2:59 PM) Losartan Potassium (100MG  Tablet, Oral) Active. Carvedilol (12.5MG  Tablet, Oral) Active. Dexilant (60MG  Capsule DR, Oral) Active. Proventil HFA (108 (90 Base)MCG/ACT Aerosol Soln, Inhalation as needed) Active. Gabapentin (300MG  Capsule, Oral) Active. HydroCHLOROthiazide (25MG  Tablet, Oral) Active. Klor-Con M20 [facility] Tablet ER, Oral) Active. Metoclopramide HCl (10MG  Tablet, Oral) Active. Mupirocin (2% Ointment, External) Active. Mirtazapine (15MG  Tablet, Oral) Active. OxyCODONE HCl (5MG  Tablet, Oral) Active. Hydrocodone-Acetaminophen (5-325MG  Tablet, Oral) Active. Nitroglycerin (0.4MG  Tab Sublingual, Sublingual as needed) Active. Ondansetron (4MG   Tablet Disint, Oral as needed) Active. Tamsulosin HCl (0.4MG  Capsule, Oral) Active. Ursodiol (300MG  Capsule, Oral) Active. Sertraline HCl (100MG  Tablet, Oral) Active. QUEtiapine Fumarate (100MG  Tablet, Oral) Active. Omeprazole (40MG  Capsule DR, Oral) Active. AmLODIPine Besylate (10MG  Tablet, Oral) Active. Pantoprazole Sodium (40MG  Tablet DR, Oral) Active. Promethazine HCl (25MG  Tablet, Oral as needed) Active. Medications Reconciled  Vitals (Chemira Jones CMA; 04/30/2017 2:58 PM) 04/30/2017 2:57 PM Weight: 221 lb Height: 65in Body Surface Area: 2.06 m Body Mass Index: 36.78 kg/m  Pulse: 71 (Regular)  BP: 124/82 (Sitting, Left Arm, Standard)       Physical Exam (Jonita Hirota B. Hassell Done MD; 04/30/2017 3:24 PM) General Note: Afro-American female who has lost a fair amount of weight and has some redundant skin. HEENT exam no scleral icterus Neck supple Chest clear to auscultation Heart sinus rhythm Abdomen no rebound or guarding at the present time. There is redundant skin and she has prior laparoscopic incisions and a previous site where her G-tube was placed. Extremity exam for range of motion     Assessment & Plan [name] Done MD; 04/30/2017 3:25 PM) CHRONIC CHOLECYSTITIS WITH CALCULUS (K80.10) Impression: we'll plan laparoscopic cholecystectomy with intraoperative cholangiogram. I have explained the procedure to her in some detail and a laparoscopic cholecystectomy book was given to her.  Matt B. Hassell Done, MD, FACS

## 2017-04-30 NOTE — H&P (Signed)
[name] Documented: 04/30/2017 2:57 PM Location: [place] Office Patient #: [id number] DOB: 08-Aug-1965 Married / Language: English / Race: Black or African American Female   History of Present Illness [name] Done MD; 04/30/2017 3:23 PM) The patient is a 52 year old female who presents for evaluation of gall stones. She had a Roux-en-Y gastric bypass by [name] in 2016. she had some gastric outlet obstruction issues and had a feeding tube placed through her remnant. This is at this point. Recently she's been having some severe right upper quadrant pain going into her back sometimes associated with diarrhea. She had a gallbladder ultrasound showing gallstones and a HIDA scan that emptied but she had a low ejection fraction.  I reviewed her HIDA scan and her ultrasound which were done at [facility] and with her symptoms it would be good to do laparoscopy and remove her gallbladder but also run her small bowel and look for an internal hernia. I will try to set this up on Friday after another gallbladder and see if we can follow and get this done sooner than later.   Allergies Malachi Bonds, CMA; 04/30/2017 2:58 PM) Lisinopril *ANTIHYPERTENSIVES*   Medication History (Chemira Jones, CMA; 04/30/2017 2:59 PM) Losartan Potassium (100MG  Tablet, Oral) Active. Carvedilol (12.5MG  Tablet, Oral) Active. Dexilant (60MG  Capsule DR, Oral) Active. Proventil HFA (108 (90 Base)MCG/ACT Aerosol Soln, Inhalation as needed) Active. Gabapentin (300MG  Capsule, Oral) Active. HydroCHLOROthiazide (25MG  Tablet, Oral) Active. Klor-Con M20 [facility] Tablet ER, Oral) Active. Metoclopramide HCl (10MG  Tablet, Oral) Active. Mupirocin (2% Ointment, External) Active. Mirtazapine (15MG  Tablet, Oral) Active. OxyCODONE HCl (5MG  Tablet, Oral) Active. Hydrocodone-Acetaminophen (5-325MG  Tablet, Oral) Active. Nitroglycerin (0.4MG  Tab Sublingual, Sublingual as needed) Active. Ondansetron (4MG   Tablet Disint, Oral as needed) Active. Tamsulosin HCl (0.4MG  Capsule, Oral) Active. Ursodiol (300MG  Capsule, Oral) Active. Sertraline HCl (100MG  Tablet, Oral) Active. QUEtiapine Fumarate (100MG  Tablet, Oral) Active. Omeprazole (40MG  Capsule DR, Oral) Active. AmLODIPine Besylate (10MG  Tablet, Oral) Active. Pantoprazole Sodium (40MG  Tablet DR, Oral) Active. Promethazine HCl (25MG  Tablet, Oral as needed) Active. Medications Reconciled  Vitals (Chemira Jones CMA; 04/30/2017 2:58 PM) 04/30/2017 2:57 PM Weight: 221 lb Height: 65in Body Surface Area: 2.06 m Body Mass Index: 36.78 kg/m  Pulse: 71 (Regular)  BP: 124/82 (Sitting, Left Arm, Standard)       Physical Exam (Keymarion Bearman B. Hassell Done MD; 04/30/2017 3:24 PM) General Note: Afro-American female who has lost a fair amount of weight and has some redundant skin. HEENT exam no scleral icterus Neck supple Chest clear to auscultation Heart sinus rhythm Abdomen no rebound or guarding at the present time. There is redundant skin and she has prior laparoscopic incisions and a previous site where her G-tube was placed. Extremity exam for range of motion     Assessment & Plan [name] Done MD; 04/30/2017 3:25 PM) CHRONIC CHOLECYSTITIS WITH CALCULUS (K80.10) Impression: we'll plan laparoscopic cholecystectomy with intraoperative cholangiogram. I have explained the procedure to her in some detail and a laparoscopic cholecystectomy book was given to her.  Matt B. Hassell Done, MD, FACS

## 2017-05-01 ENCOUNTER — Encounter (HOSPITAL_COMMUNITY): Payer: Self-pay | Admitting: *Deleted

## 2017-05-01 ENCOUNTER — Other Ambulatory Visit: Payer: Self-pay

## 2017-05-01 ENCOUNTER — Ambulatory Visit (INDEPENDENT_AMBULATORY_CARE_PROVIDER_SITE_OTHER): Payer: PPO | Admitting: Family Medicine

## 2017-05-01 VITALS — BP 130/84 | HR 68 | Temp 98.4°F | Wt 222.6 lb

## 2017-05-01 DIAGNOSIS — H9202 Otalgia, left ear: Secondary | ICD-10-CM

## 2017-05-01 DIAGNOSIS — J069 Acute upper respiratory infection, unspecified: Secondary | ICD-10-CM | POA: Diagnosis not present

## 2017-05-01 NOTE — Progress Notes (Signed)
Patient ID: Denise Davis, female   DOB: Sep 08, 1965, 52 y.o.   MRN: [id number]  PCP: Denise Haven, MD  Subjective:  Denise Davis is a 52 y.o. year old very pleasant female patient who presents with symptoms left ear pain, mild sore throat,  congestion, mild cough that is nonproductive and does not wake patient at night -started: 2 weeks ago, symptom of ear pain has remained but is feeling better today. -previous treatments: acetaminophen has provided moderate benefit -sick contacts/travel/risks: denies flu exposure. No recent sick contact exposure, influenza is UTD -Hx of: allergies Scheduled for cholecystectomy tomorrow  ROS-denies fever, SOB, NVD, tooth pain  Pertinent Past Medical History- HTN, DM, renal cell cancer and left nephrectomy  Medications- reviewed  Current Outpatient Medications  Medication Sig Dispense Refill  . calcium-vitamin D 250-100 MG-UNIT tablet Take 1 tablet by mouth 2 (two) times daily. Reported on 06/16/2015 180 tablet 3  . carvedilol (COREG) 12.5 MG tablet Take 1 tablet (12.5 mg total) by mouth 2 (two) times daily with a meal. 60 tablet 6  . Cholecalciferol (VITAMIN D-3) 1000 units CAPS Take 1 capsule (1,000 Units total) by mouth daily. Reported on 06/16/2015 90 capsule 3  . dexlansoprazole (DEXILANT) 60 MG capsule Take 1 capsule (60 mg total) by mouth daily. 30 capsule 6  . gabapentin (NEURONTIN) 300 MG capsule Take 8 capsules (2,400 mg total) by mouth at bedtime. (Patient taking differently: Take 2,400 mg by mouth at bedtime. Takes 600 AM, 1200 PM) 240 capsule 3  . HYDROcodone-acetaminophen (NORCO/VICODIN) 5-325 MG tablet Take 1 tablet by mouth every 6 (six) hours as needed for moderate pain. 30 tablet 0  . loratadine (CLARITIN) 10 MG tablet Take 1 tablet (10 mg total) by mouth daily. 90 tablet 3  . losartan (COZAAR) 100 MG tablet TAKE ONE TABLET BY MOUTH ONCE DAILY 90 tablet 3  . Melatonin 3 MG TABS Take 9 mg by mouth.     . mirtazapine (REMERON) 15  MG tablet Take 1 tablet (15 mg total) by mouth at bedtime. 90 tablet 0  . Multiple Vitamins-Minerals (MULTIVITAMIN WITH MINERALS) tablet Take 1 tablet by mouth daily. Reported on 06/16/2015    . nitroGLYCERIN (NITROSTAT) 0.4 MG SL tablet Place 1 tablet (0.4 mg total) under the tongue every 5 (five) minutes as needed for chest pain. (may repeat every 5 minutes but seek medical help if pain persists after 3 tablets) 25 tablet 0  . ondansetron (ZOFRAN-ODT) 4 MG disintegrating tablet DISSOLVE ONE TABLET IN MOUTH EVERY 8 HOURS AS NEEDED FOR NAUSEA OR VOMITING. 30 tablet 3  . promethazine (PHENERGAN) 25 MG tablet TAKE 1 TABLET BY MOUTH EVERY 8 HOURS AS NEEDED FOR NAUSEA OR VOMITING 30 tablet 2  . QUEtiapine (SEROQUEL) 100 MG tablet TAKE 1 TABLET BY MOUTH AT BEDTIME 90 tablet 0  . sertraline (ZOLOFT) 100 MG tablet Take 2 tablets (200 mg total) by mouth at bedtime. 180 tablet 1  . traZODone (DESYREL) 100 MG tablet Take 200 mg by mouth at bedtime.    . triamcinolone cream (KENALOG) 0.1 % APPLY TOPICALLY TWO TIMES DAILY 30 g 0  . vitamin B-12 (CYANOCOBALAMIN) 500 MCG tablet Take by mouth.    Marland Kitchen amoxicillin-clavulanate (AUGMENTIN) 875-125 MG tablet Take 1 tablet by mouth. For dental     No current facility-administered medications for this visit.     Objective: BP 130/84   Pulse 68   Temp 98.4 F (36.9 C) (Oral)   Wt 222 lb 9.6 oz (101  kg)   BMI 37.04 kg/m  Gen: NAD, resting comfortably HEENT: Turbinates erythematous, TMs without erythema or bulging. TMs mildly dull; able to visualize light reflex, oropharynx clear and moist, no tonsillar exudate or edema, no sinus tenderness CV: RRR no murmurs rubs or gallops Lungs: CTAB no crackles, wheeze, rhonchi Ext: no edema Skin: warm, dry, no rash Neuro: grossly normal, moves all extremities  Assessment/Plan: 1. Left ear pain Exam is reassuring; advised use of acetaminophen as patient is not a candidate for NSAID therapy.She is taking daily phenergan and  zofran prior to procedure tomorrow and reports that symptoms have improved with use of phenergan. We reviewed classification of phenergan and antihistamine properties associated that may be likely source of improvement. Suspect that symptoms are related to resolving upper respiratory infection.  Follow up advised if symptoms do not improve with treatment, worsen, or she develops a fever >100.  2. Acute upper respiratory infection History and exam today are suggestive of viral infection most likely due to upper respiratory infection. Symptomatic treatment with: acetaminophen, nasal saline rinses, and rest.  We discussed that we did not find any infection that had higher probability of being bacterial such as pneumonia or strep throat. We discussed signs that bacterial infection may have developed particularly fever or shortness of breath. Likely course of 1-2 weeks.  Finally, we reviewed reasons to return to care including if symptoms worsen or persist or new concerns arise- once again particularly shortness of breath or fever.   Laurita Quint, FNP

## 2017-05-01 NOTE — Patient Instructions (Signed)
Please useTylenol for discomfort as discussed.  If symptoms do not improve with treatment, worsen, or you develop new symptoms, please follow up with your provider.  Feel better soon!   Earache, Adult An earache, or ear pain, can be caused by many things, including:  An infection.  Ear wax buildup.  Ear pressure.  Something in the ear that should not be there (foreign body).  A sore throat.  Tooth problems.  Jaw problems.  Treatment of the earache will depend on the cause. If the cause is not clear or cannot be determined, you may need to watch your symptoms until your earache goes away or until a cause is found. Follow these instructions at home: Pay attention to any changes in your symptoms. Take these actions to help with your pain:  Take or apply over-the-counter and prescription medicines only as told by your health care provider.  If you were prescribed an antibiotic medicine, use it as told by your health care provider. Do not stop using the antibiotic even if you start to feel better.  Do not put anything in your ear other than medicine that is prescribed by your health care provider.  If directed, apply heat to the affected area as often as told by your health care provider. Use the heat source that your health care provider recommends, such as a moist heat pack or a heating pad. ? Place a towel between your skin and the heat source. ? Leave the heat on for 20-30 minutes. ? Remove the heat if your skin turns bright red. This is especially important if you are unable to feel pain, heat, or cold. You may have a greater risk of getting burned.  If directed, put ice on the ear: ? Put ice in a plastic bag. ? Place a towel between your skin and the bag. ? Leave the ice on for 20 minutes, 2-3 times a day.  Try resting in an upright position instead of lying down. This may help to reduce pressure in your ear and relieve pain.  Chew gum if it helps to relieve your ear  pain.  Treat any allergies as told by your health care provider.  Keep all follow-up visits as told by your health care provider. This is important.  Contact a health care provider if:  Your pain does not improve within 2 days.  Your earache gets worse.  You have new symptoms.  You have a fever. Get help right away if:  You have a severe headache.  You have a stiff neck.  You have trouble swallowing.  You have redness or swelling behind your ear.  You have fluid or blood coming from your ear.  You have hearing loss.  You feel dizzy. This information is not intended to replace advice given to you by your health care provider. Make sure you discuss any questions you have with your health care provider. Document Released: 11/03/2003 Document Revised: 11/14/2015 Document Reviewed: 09/11/2015 Elsevier Interactive Patient Education  [employer].

## 2017-05-02 ENCOUNTER — Ambulatory Visit (HOSPITAL_COMMUNITY): Payer: PPO

## 2017-05-02 ENCOUNTER — Ambulatory Visit (HOSPITAL_COMMUNITY): Payer: PPO | Admitting: Anesthesiology

## 2017-05-02 ENCOUNTER — Observation Stay (HOSPITAL_COMMUNITY)
Admission: RE | Admit: 2017-05-02 | Discharge: 2017-05-04 | Disposition: A | Discharge status: Home / Self Care | Payer: PPO | Source: Ambulatory Visit | Attending: Surgery | Admitting: Surgery

## 2017-05-02 ENCOUNTER — Encounter (HOSPITAL_COMMUNITY)
Admission: RE | Disposition: A | Discharge status: Home / Self Care | Payer: Self-pay | Source: Ambulatory Visit | Attending: Surgery

## 2017-05-02 ENCOUNTER — Encounter (HOSPITAL_COMMUNITY): Payer: Self-pay | Admitting: *Deleted

## 2017-05-02 DIAGNOSIS — Z888 Allergy status to other drugs, medicaments and biological substances status: Secondary | ICD-10-CM | POA: Insufficient documentation

## 2017-05-02 DIAGNOSIS — Z79899 Other long term (current) drug therapy: Secondary | ICD-10-CM | POA: Insufficient documentation

## 2017-05-02 DIAGNOSIS — Z7982 Long term (current) use of aspirin: Secondary | ICD-10-CM | POA: Diagnosis not present

## 2017-05-02 DIAGNOSIS — M25569 Pain in unspecified knee: Secondary | ICD-10-CM

## 2017-05-02 DIAGNOSIS — I10 Essential (primary) hypertension: Secondary | ICD-10-CM | POA: Insufficient documentation

## 2017-05-02 DIAGNOSIS — Z9049 Acquired absence of other specified parts of digestive tract: Secondary | ICD-10-CM

## 2017-05-02 DIAGNOSIS — G4730 Sleep apnea, unspecified: Secondary | ICD-10-CM | POA: Insufficient documentation

## 2017-05-02 DIAGNOSIS — Z96652 Presence of left artificial knee joint: Secondary | ICD-10-CM | POA: Diagnosis not present

## 2017-05-02 DIAGNOSIS — K8010 Calculus of gallbladder with chronic cholecystitis without obstruction: Principal | ICD-10-CM | POA: Insufficient documentation

## 2017-05-02 DIAGNOSIS — K219 Gastro-esophageal reflux disease without esophagitis: Secondary | ICD-10-CM | POA: Insufficient documentation

## 2017-05-02 DIAGNOSIS — K811 Chronic cholecystitis: Secondary | ICD-10-CM | POA: Diagnosis not present

## 2017-05-02 DIAGNOSIS — K8020 Calculus of gallbladder without cholecystitis without obstruction: Secondary | ICD-10-CM | POA: Diagnosis not present

## 2017-05-02 DIAGNOSIS — K819 Cholecystitis, unspecified: Secondary | ICD-10-CM

## 2017-05-02 HISTORY — PX: CHOLECYSTECTOMY: SHX55

## 2017-05-02 LAB — BASIC METABOLIC PANEL
ANION GAP: 8 (ref 5–15)
BUN: 13 mg/dL (ref 6–20)
CALCIUM: 9.1 mg/dL (ref 8.9–10.3)
CO2: 25 mmol/L (ref 22–32)
Chloride: 108 mmol/L (ref 101–111)
Creatinine, Ser: 0.72 mg/dL (ref 0.44–1.00)
Glucose, Bld: 100 mg/dL — ABNORMAL HIGH (ref 65–99)
POTASSIUM: 4 mmol/L (ref 3.5–5.1)
Sodium: 141 mmol/L (ref 135–145)

## 2017-05-02 LAB — CBC
HEMATOCRIT: 42.9 % (ref 36.0–46.0)
HEMOGLOBIN: 14.4 g/dL (ref 12.0–15.0)
MCH: 31.1 pg (ref 26.0–34.0)
MCHC: 33.6 g/dL (ref 30.0–36.0)
MCV: 92.7 fL (ref 78.0–100.0)
Platelets: 171 10*3/uL (ref 150–400)
RBC: 4.63 MIL/uL (ref 3.87–5.11)
RDW: 14.6 % (ref 11.5–15.5)
WBC: 3.9 10*3/uL — ABNORMAL LOW (ref 4.0–10.5)

## 2017-05-02 SURGERY — LAPAROSCOPIC CHOLECYSTECTOMY WITH INTRAOPERATIVE CHOLANGIOGRAM
Anesthesia: General

## 2017-05-02 MED ORDER — CELECOXIB 200 MG PO CAPS
200.0000 mg | ORAL_CAPSULE | Freq: Two times a day (BID) | ORAL | Status: DC
  Administered 2017-05-02 – 2017-05-03 (×3): 200 mg via ORAL
  Filled 2017-05-02 (×3): qty 1

## 2017-05-02 MED ORDER — ONDANSETRON 4 MG PO TBDP: 4.0000 mg | ORAL_TABLET | Freq: Three times a day (TID) | ORAL | Status: DC | PRN

## 2017-05-02 MED ORDER — ONDANSETRON HCL 4 MG/2ML IJ SOLN
INTRAMUSCULAR | Status: AC
  Filled 2017-05-02: qty 2

## 2017-05-02 MED ORDER — 0.9 % SODIUM CHLORIDE (POUR BTL) OPTIME
TOPICAL | Status: DC | PRN
  Administered 2017-05-02: 1000 mL

## 2017-05-02 MED ORDER — SUGAMMADEX SODIUM 200 MG/2ML IV SOLN
INTRAVENOUS | Status: AC
  Filled 2017-05-02: qty 2

## 2017-05-02 MED ORDER — GABAPENTIN 300 MG PO CAPS
300.0000 mg | ORAL_CAPSULE | ORAL | Status: DC
  Filled 2017-05-02: qty 1

## 2017-05-02 MED ORDER — DIPHENHYDRAMINE HCL 12.5 MG/5ML PO ELIX: 12.5000 mg | ORAL_SOLUTION | Freq: Four times a day (QID) | ORAL | Status: DC | PRN

## 2017-05-02 MED ORDER — ROCURONIUM BROMIDE 10 MG/ML (PF) SYRINGE
PREFILLED_SYRINGE | INTRAVENOUS | Status: AC
  Filled 2017-05-02: qty 5

## 2017-05-02 MED ORDER — PROMETHAZINE HCL 25 MG/ML IJ SOLN
INTRAMUSCULAR | Status: AC
  Filled 2017-05-02: qty 1

## 2017-05-02 MED ORDER — ONDANSETRON 4 MG PO TBDP: 4.0000 mg | ORAL_TABLET | Freq: Four times a day (QID) | ORAL | Status: DC | PRN

## 2017-05-02 MED ORDER — KCL IN DEXTROSE-NACL 20-5-0.45 MEQ/L-%-% IV SOLN
INTRAVENOUS | Status: DC
  Administered 2017-05-02: 1000 mL via INTRAVENOUS
  Administered 2017-05-04: 01:00:00 via INTRAVENOUS
  Filled 2017-05-02 (×4): qty 1000

## 2017-05-02 MED ORDER — LIDOCAINE 2% (20 MG/ML) 5 ML SYRINGE
INTRAMUSCULAR | Status: DC | PRN
  Administered 2017-05-02: 100 mg via INTRAVENOUS

## 2017-05-02 MED ORDER — BUPIVACAINE LIPOSOME 1.3 % IJ SUSP
20.0000 mL | Freq: Once | INTRAMUSCULAR | Status: AC
Start: 2017-05-02 — End: 2017-05-02
  Administered 2017-05-02: 20 mL
  Filled 2017-05-02: qty 20

## 2017-05-02 MED ORDER — CEFAZOLIN SODIUM-DEXTROSE 2-4 GM/100ML-% IV SOLN
2.0000 g | INTRAVENOUS | Status: AC
  Administered 2017-05-02: 2 g via INTRAVENOUS
  Filled 2017-05-02: qty 100

## 2017-05-02 MED ORDER — MIDAZOLAM HCL 2 MG/2ML IJ SOLN
INTRAMUSCULAR | Status: AC
  Filled 2017-05-02: qty 2

## 2017-05-02 MED ORDER — HYDRALAZINE HCL 20 MG/ML IJ SOLN: 10.0000 mg | INTRAMUSCULAR | Status: DC | PRN

## 2017-05-02 MED ORDER — SUGAMMADEX SODIUM 200 MG/2ML IV SOLN
INTRAVENOUS | Status: DC | PRN
  Administered 2017-05-02: 200 mg via INTRAVENOUS

## 2017-05-02 MED ORDER — HYDROMORPHONE HCL 1 MG/ML IJ SOLN
INTRAMUSCULAR | Status: AC
  Filled 2017-05-02: qty 2

## 2017-05-02 MED ORDER — HYDROMORPHONE HCL 1 MG/ML IJ SOLN
0.2500 mg | INTRAMUSCULAR | Status: DC | PRN
  Administered 2017-05-02 (×4): 0.5 mg via INTRAVENOUS

## 2017-05-02 MED ORDER — MIDAZOLAM HCL 2 MG/2ML IJ SOLN
INTRAMUSCULAR | Status: DC | PRN
  Administered 2017-05-02: 2 mg via INTRAVENOUS

## 2017-05-02 MED ORDER — ACETAMINOPHEN 500 MG PO TABS
1000.0000 mg | ORAL_TABLET | ORAL | Status: AC
  Administered 2017-05-02: 1000 mg via ORAL
  Filled 2017-05-02: qty 2

## 2017-05-02 MED ORDER — HYDROCODONE-ACETAMINOPHEN 5-325 MG PO TABS
1.0000 | ORAL_TABLET | ORAL | Status: DC | PRN
  Administered 2017-05-02 – 2017-05-03 (×3): 2 via ORAL
  Filled 2017-05-02 (×3): qty 2

## 2017-05-02 MED ORDER — HEPARIN SODIUM (PORCINE) 5000 UNIT/ML IJ SOLN
5000.0000 [IU] | Freq: Three times a day (TID) | INTRAMUSCULAR | Status: DC
  Administered 2017-05-03 – 2017-05-04 (×4): 5000 [IU] via SUBCUTANEOUS
  Filled 2017-05-02 (×4): qty 1

## 2017-05-02 MED ORDER — IOPAMIDOL (ISOVUE-300) INJECTION 61%
INTRAVENOUS | Status: AC
  Filled 2017-05-02: qty 50

## 2017-05-02 MED ORDER — CELECOXIB 200 MG PO CAPS
200.0000 mg | ORAL_CAPSULE | ORAL | Status: AC
  Administered 2017-05-02: 200 mg via ORAL
  Filled 2017-05-02: qty 1

## 2017-05-02 MED ORDER — DEXTROSE 5 % IV SOLN
2.0000 g | INTRAVENOUS | Status: DC
  Administered 2017-05-02 – 2017-05-03 (×2): 2 g via INTRAVENOUS
  Filled 2017-05-02 (×2): qty 2

## 2017-05-02 MED ORDER — LIDOCAINE 2% (20 MG/ML) 5 ML SYRINGE
INTRAMUSCULAR | Status: AC
  Filled 2017-05-02: qty 5

## 2017-05-02 MED ORDER — IOPAMIDOL (ISOVUE-300) INJECTION 61%
INTRAVENOUS | Status: DC | PRN
  Administered 2017-05-02: 10 mL via INTRAVENOUS

## 2017-05-02 MED ORDER — CHLORHEXIDINE GLUCONATE CLOTH 2 % EX PADS: 6.0000 | MEDICATED_PAD | Freq: Once | CUTANEOUS | Status: DC

## 2017-05-02 MED ORDER — ONDANSETRON HCL 4 MG/2ML IJ SOLN
INTRAMUSCULAR | Status: DC | PRN
  Administered 2017-05-02: 4 mg via INTRAVENOUS

## 2017-05-02 MED ORDER — ROCURONIUM BROMIDE 10 MG/ML (PF) SYRINGE
PREFILLED_SYRINGE | INTRAVENOUS | Status: DC | PRN
  Administered 2017-05-02: 10 mg via INTRAVENOUS
  Administered 2017-05-02: 60 mg via INTRAVENOUS

## 2017-05-02 MED ORDER — CARVEDILOL 12.5 MG PO TABS
12.5000 mg | ORAL_TABLET | Freq: Two times a day (BID) | ORAL | Status: DC
  Administered 2017-05-03 (×2): 12.5 mg via ORAL
  Filled 2017-05-02 (×4): qty 1

## 2017-05-02 MED ORDER — GABAPENTIN 300 MG PO CAPS
300.0000 mg | ORAL_CAPSULE | Freq: Two times a day (BID) | ORAL | Status: DC
  Administered 2017-05-03 (×2): 300 mg via ORAL
  Filled 2017-05-02 (×3): qty 1

## 2017-05-02 MED ORDER — HEPARIN SODIUM (PORCINE) 5000 UNIT/ML IJ SOLN
5000.0000 [IU] | Freq: Once | INTRAMUSCULAR | Status: AC
  Administered 2017-05-02: 5000 [IU] via SUBCUTANEOUS
  Filled 2017-05-02: qty 1

## 2017-05-02 MED ORDER — PANTOPRAZOLE SODIUM 40 MG IV SOLR
40.0000 mg | Freq: Every day | INTRAVENOUS | Status: DC
  Administered 2017-05-02 – 2017-05-03 (×2): 40 mg via INTRAVENOUS
  Filled 2017-05-02 (×2): qty 40

## 2017-05-02 MED ORDER — DIPHENHYDRAMINE HCL 50 MG/ML IJ SOLN
12.5000 mg | Freq: Four times a day (QID) | INTRAMUSCULAR | Status: DC | PRN
  Administered 2017-05-02 – 2017-05-03 (×2): 12.5 mg via INTRAVENOUS
  Administered 2017-05-03: 25 mg via INTRAVENOUS
  Administered 2017-05-03 – 2017-05-04 (×2): 12.5 mg via INTRAVENOUS
  Filled 2017-05-02 (×5): qty 1

## 2017-05-02 MED ORDER — MORPHINE SULFATE (PF) 4 MG/ML IV SOLN
1.0000 mg | INTRAVENOUS | Status: DC | PRN
  Administered 2017-05-02 – 2017-05-04 (×5): 1 mg via INTRAVENOUS
  Filled 2017-05-02 (×5): qty 1

## 2017-05-02 MED ORDER — HYDROMORPHONE HCL 1 MG/ML IJ SOLN
0.2500 mg | INTRAMUSCULAR | Status: DC | PRN
  Administered 2017-05-02 (×2): 0.5 mg via INTRAVENOUS

## 2017-05-02 MED ORDER — DEXAMETHASONE SODIUM PHOSPHATE 10 MG/ML IJ SOLN
INTRAMUSCULAR | Status: DC | PRN
  Administered 2017-05-02: 10 mg via INTRAVENOUS

## 2017-05-02 MED ORDER — DEXAMETHASONE SODIUM PHOSPHATE 10 MG/ML IJ SOLN
INTRAMUSCULAR | Status: AC
  Filled 2017-05-02: qty 1

## 2017-05-02 MED ORDER — PROMETHAZINE HCL 25 MG/ML IJ SOLN
6.2500 mg | INTRAMUSCULAR | Status: DC | PRN
  Administered 2017-05-02: 12.5 mg via INTRAVENOUS

## 2017-05-02 MED ORDER — LACTATED RINGERS IR SOLN
Status: DC | PRN
  Administered 2017-05-02: 1000 mL

## 2017-05-02 MED ORDER — ONDANSETRON HCL 4 MG/2ML IJ SOLN
4.0000 mg | Freq: Four times a day (QID) | INTRAMUSCULAR | Status: DC | PRN
  Administered 2017-05-02 – 2017-05-04 (×2): 4 mg via INTRAVENOUS
  Filled 2017-05-02 (×2): qty 2

## 2017-05-02 MED ORDER — DIPHENHYDRAMINE HCL 50 MG/ML IJ SOLN
12.5000 mg | Freq: Four times a day (QID) | INTRAMUSCULAR | Status: DC | PRN
Start: 2017-05-02 — End: 2017-05-02

## 2017-05-02 MED ORDER — DIPHENHYDRAMINE HCL 25 MG PO CAPS: 12.5000 mg | ORAL_CAPSULE | Freq: Four times a day (QID) | ORAL | Status: DC | PRN

## 2017-05-02 MED ORDER — PROPOFOL 10 MG/ML IV BOLUS
INTRAVENOUS | Status: AC
  Filled 2017-05-02: qty 20

## 2017-05-02 MED ORDER — FENTANYL CITRATE (PF) 100 MCG/2ML IJ SOLN
INTRAMUSCULAR | Status: AC
  Filled 2017-05-02: qty 2

## 2017-05-02 MED ORDER — HYDROCODONE-ACETAMINOPHEN 5-325 MG PO TABS: 1.0000 | ORAL_TABLET | Freq: Four times a day (QID) | ORAL | Status: DC | PRN

## 2017-05-02 MED ORDER — LIDOCAINE 2% (20 MG/ML) 5 ML SYRINGE
INTRAMUSCULAR | Status: DC | PRN
  Administered 2017-05-02: 1.5 mg/kg/h via INTRAVENOUS

## 2017-05-02 MED ORDER — LACTATED RINGERS IV SOLN
INTRAVENOUS | Status: DC
  Administered 2017-05-02 (×2): via INTRAVENOUS

## 2017-05-02 MED ORDER — QUETIAPINE FUMARATE 100 MG PO TABS
100.0000 mg | ORAL_TABLET | Freq: Every day | ORAL | Status: DC
  Administered 2017-05-02 – 2017-05-03 (×2): 100 mg via ORAL
  Filled 2017-05-02 (×2): qty 1

## 2017-05-02 MED ORDER — FENTANYL CITRATE (PF) 100 MCG/2ML IJ SOLN
INTRAMUSCULAR | Status: DC | PRN
  Administered 2017-05-02: 100 ug via INTRAVENOUS

## 2017-05-02 MED ORDER — PROPOFOL 10 MG/ML IV BOLUS
INTRAVENOUS | Status: DC | PRN
  Administered 2017-05-02: 200 mg via INTRAVENOUS

## 2017-05-02 MED ORDER — KETOROLAC TROMETHAMINE 30 MG/ML IJ SOLN
INTRAMUSCULAR | Status: AC
  Filled 2017-05-02: qty 1

## 2017-05-02 MED ORDER — KETOROLAC TROMETHAMINE 30 MG/ML IJ SOLN
30.0000 mg | Freq: Once | INTRAMUSCULAR | Status: DC | PRN
  Administered 2017-05-02: 30 mg via INTRAVENOUS

## 2017-05-02 SURGICAL SUPPLY — 46 items
ADH SKN CLS APL DERMABOND .7 (GAUZE/BANDAGES/DRESSINGS) ×1
APL SKNCLS STERI-STRIP NONHPOA (GAUZE/BANDAGES/DRESSINGS)
APPLICATOR COTTON TIP 6IN STRL (MISCELLANEOUS) ×6 IMPLANT
APPLIER CLIP ROT 10 11.4 M/L (STAPLE) ×3
APR CLP MED LRG 11.4X10 (STAPLE) ×1
BAG SPEC RTRVL 10 TROC 200 (ENDOMECHANICALS) ×1
BENZOIN TINCTURE PRP APPL 2/3 (GAUZE/BANDAGES/DRESSINGS) IMPLANT
CABLE HIGH FREQUENCY MONO STRZ (ELECTRODE) ×3 IMPLANT
CATH REDDICK CHOLANGI 4FR 50CM (CATHETERS) ×3 IMPLANT
CLIP APPLIE ROT 10 11.4 M/L (STAPLE) ×1 IMPLANT
CLOSURE WOUND 1/2 X4 (GAUZE/BANDAGES/DRESSINGS)
COVER MAYO STAND STRL (DRAPES) ×3 IMPLANT
COVER SURGICAL LIGHT HANDLE (MISCELLANEOUS) ×3 IMPLANT
DECANTER SPIKE VIAL GLASS SM (MISCELLANEOUS) ×3 IMPLANT
DERMABOND ADVANCED (GAUZE/BANDAGES/DRESSINGS) ×2
DERMABOND ADVANCED .7 DNX12 (GAUZE/BANDAGES/DRESSINGS) ×1 IMPLANT
DRAPE C-ARM 42X120 X-RAY (DRAPES) ×3 IMPLANT
ELECT PENCIL ROCKER SW 15FT (MISCELLANEOUS) IMPLANT
ELECT REM PT RETURN 15FT ADLT (MISCELLANEOUS) ×3 IMPLANT
GLOVE BIO SURGEON STRL SZ7 (GLOVE) ×4 IMPLANT
GLOVE BIOGEL M 8.0 STRL (GLOVE) ×3 IMPLANT
GLOVE BIOGEL PI IND STRL 6.5 (GLOVE) IMPLANT
GLOVE BIOGEL PI IND STRL 7.5 (GLOVE) IMPLANT
GLOVE BIOGEL PI INDICATOR 6.5 (GLOVE) ×2
GLOVE BIOGEL PI INDICATOR 7.5 (GLOVE) ×10
GOWN STRL REUS W/TWL XL LVL3 (GOWN DISPOSABLE) ×9 IMPLANT
HEMOSTAT SURGICEL 4X8 (HEMOSTASIS) IMPLANT
IV CATH 14GX2 1/4 (CATHETERS) ×3 IMPLANT
KIT BASIN OR (CUSTOM PROCEDURE TRAY) ×3 IMPLANT
L-HOOK LAP DISP 36CM (ELECTROSURGICAL)
LHOOK LAP DISP 36CM (ELECTROSURGICAL) IMPLANT
POUCH RETRIEVAL ECOSAC 10 (ENDOMECHANICALS) IMPLANT
POUCH RETRIEVAL ECOSAC 10MM (ENDOMECHANICALS) ×2
SCISSORS METZENBAUM CVD 44CM (INSTRUMENTS) ×3 IMPLANT
SET IRRIG TUBING LAPAROSCOPIC (IRRIGATION / IRRIGATOR) ×3 IMPLANT
SLEEVE XCEL OPT CAN 5 100 (ENDOMECHANICALS) ×7 IMPLANT
STAPLER VISISTAT 35W (STAPLE) ×2 IMPLANT
STRIP CLOSURE SKIN 1/2X4 (GAUZE/BANDAGES/DRESSINGS) IMPLANT
SUT MNCRL AB 4-0 PS2 18 (SUTURE) ×3 IMPLANT
SYR 20CC LL (SYRINGE) ×3 IMPLANT
TOWEL OR 17X26 10 PK STRL BLUE (TOWEL DISPOSABLE) ×3 IMPLANT
TRAY LAPAROSCOPIC (CUSTOM PROCEDURE TRAY) ×3 IMPLANT
TROCAR BLADELESS OPT 5 100 (ENDOMECHANICALS) ×3 IMPLANT
TROCAR XCEL BLUNT TIP 100MML (ENDOMECHANICALS) IMPLANT
TROCAR XCEL NON-BLD 11X100MML (ENDOMECHANICALS) ×3 IMPLANT
TUBING INSUF HEATED (TUBING) ×3 IMPLANT

## 2017-05-02 NOTE — Interval H&P Note (Signed)
History and Physical Interval Note:  05/02/2017 11:58 AM  Denise Davis  has presented today for surgery, with the diagnosis of chronic cholecystitis  The various methods of treatment have been discussed with the patient and family. After consideration of risks, benefits and other options for treatment, the patient has consented to  Procedure(s): LAPAROSCOPIC CHOLECYSTECTOMY WITH INTRAOPERATIVE CHOLANGIOGRAM (N/A) as a surgical intervention .  The patient's history has been reviewed, patient examined, no change in status, stable for surgery.  I have reviewed the patient's chart and labs.  Questions were answered to the patient's satisfaction.     [name]

## 2017-05-02 NOTE — Anesthesia Preprocedure Evaluation (Signed)
Anesthesia Evaluation  Patient identified by MRN, date of birth, ID band Patient awake    Reviewed: Allergy & Precautions, NPO status , Patient's Chart, lab work & pertinent test results  Airway Mallampati: II  TM Distance: >3 FB Neck ROM: Full    Dental no notable dental hx.    Pulmonary neg pulmonary ROS, sleep apnea , former smoker,    Pulmonary exam normal breath sounds clear to auscultation       Cardiovascular hypertension, Normal cardiovascular exam Rhythm:Regular Rate:Normal     Neuro/Psych negative neurological ROS  negative psych ROS   GI/Hepatic Neg liver ROS, GERD  ,  Endo/Other    Renal/GU   negative genitourinary   Musculoskeletal negative musculoskeletal ROS (+)   Abdominal   Peds negative pediatric ROS (+)  Hematology negative hematology ROS (+)   Anesthesia Other Findings   Reproductive/Obstetrics negative OB ROS                             Anesthesia Physical Anesthesia Plan  ASA: II  Anesthesia Plan: General   Post-op Pain Management:    Induction: Intravenous  PONV Risk Score and Plan: 3 and Ondansetron, Dexamethasone, Midazolam and Treatment may vary due to age or medical condition  Airway Management Planned: Oral ETT  Additional Equipment:   Intra-op Plan:   Post-operative Plan: Extubation in OR  Informed Consent: I have reviewed the patients History and Physical, chart, labs and discussed the procedure including the risks, benefits and alternatives for the proposed anesthesia with the patient or authorized representative who has indicated his/her understanding and acceptance.   Dental advisory given  Plan Discussed with: CRNA and Surgeon  Anesthesia Plan Comments:         Anesthesia Quick Evaluation

## 2017-05-02 NOTE — Transfer of Care (Signed)
Immediate Anesthesia Transfer of Care Note  Patient: Denise Davis  Procedure(s) Performed: LAPAROSCOPIC CHOLECYSTECTOMY WITH INTRAOPERATIVE CHOLANGIOGRAM (N/A )  Patient Location: PACU  Anesthesia Type:General  Level of Consciousness: awake and patient cooperative  Airway & Oxygen Therapy: Patient Spontanous Breathing and Patient connected to face mask oxygen  Post-op Assessment: Report given to RN and Post -op Vital signs reviewed and stable  Post vital signs: Reviewed and stable  Last Vitals:  Vitals:   05/02/17 0938  BP: (!) 124/97  Pulse: 80  Resp: 16  Temp: 36.8 C  SpO2: 100%    Last Pain:  Vitals:   05/02/17 0938  TempSrc: Oral      Patients Stated Pain Goal: 4 (29/56/21 3086)  Complications: No apparent anesthesia complications

## 2017-05-02 NOTE — Anesthesia Procedure Notes (Signed)
Procedure Name: Intubation Date/Time: 05/02/2017 1:26 PM Performed by: Dione Booze, CRNA Pre-anesthesia Checklist: Suction available, Patient being monitored, Emergency Drugs available and Patient identified Patient Re-evaluated:Patient Re-evaluated prior to induction Oxygen Delivery Method: Circle system utilized Preoxygenation: Pre-oxygenation with 100% oxygen Induction Type: IV induction Laryngoscope Size: Mac and 4 Grade View: Grade I Tube type: Oral Tube size: 7.5 mm Number of attempts: 1 Airway Equipment and Method: Stylet Placement Confirmation: ETT inserted through vocal cords under direct vision,  positive ETCO2 and breath sounds checked- equal and bilateral Secured at: 21 cm Tube secured with: Tape Dental Injury: Teeth and Oropharynx as per pre-operative assessment

## 2017-05-02 NOTE — Op Note (Signed)
Denise Davis   05/02/2017  2:40 PM  Procedure: Laparoscopic Cholecystectomy with intraoperative cholangiogram  Surgeon: Catalina Antigua B. Hassell Done, MD, FACS Asst:  Serita Grammes, MD, FACS  Anes:  General  Drains:  None  Findings: Chronic cholecystitis with normal IOC  Description of Procedure: The patient was taken to OR 1 and given general anesthesia.  The patient was prepped with PCMX and draped sterilely. A time out was performed.  Access to the abdomen was achieved with a 5 mm Optiview through the right upper quadrant.  Port placement included a 5 mm in the umbilicus, an 11 mm in the upper midline and the two 5 mm in the right upper quadrant (4 ports).    The gallbladder was visualized and the fundus was grasped and the gallbladder was elevated. Traction on the infundibulum allowed for successful demonstration of the critical view. Inflammatory changes were chronic.  The cystic duct was identified and clipped up on the gallbladder and an incision was made in the cystic duct and the Reddick catheter was inserted after milking the cystic duct of any debris-two stones were retrieved from the cystic duct. A dynamic cholangiogram was performed which demonstrated no CBD stones.    The cystic duct was then triple clipped and divided, the cystic artery was double clipped and divided and then the gallbladder was removed from the gallbladder bed. Removal of the gallbladder from the gallbladder bed was straightforward.  The gallbladder was then placed in a bag and brought out through one of the trocar sites. The gallbladder bed was inspected and no bleeding or bile leaks were seen.   Laparoscopic visualization was used when closing fascial defects for trocar sites.   Incisions were injected with Exparel and closed with 4-0 Vicryl and Dermabond on the skin.  Sponge and needle count were correct.    The patient was taken to the recovery room in satisfactory condition.

## 2017-05-03 ENCOUNTER — Observation Stay (HOSPITAL_COMMUNITY): Payer: PPO

## 2017-05-03 DIAGNOSIS — K8010 Calculus of gallbladder with chronic cholecystitis without obstruction: Secondary | ICD-10-CM | POA: Diagnosis not present

## 2017-05-03 DIAGNOSIS — M25562 Pain in left knee: Secondary | ICD-10-CM | POA: Diagnosis not present

## 2017-05-03 DIAGNOSIS — S8992XA Unspecified injury of left lower leg, initial encounter: Secondary | ICD-10-CM | POA: Diagnosis not present

## 2017-05-03 MED ORDER — OXYCODONE HCL 5 MG PO TABS
5.0000 mg | ORAL_TABLET | ORAL | Status: DC | PRN
  Administered 2017-05-03: 5 mg via ORAL
  Administered 2017-05-03 – 2017-05-04 (×3): 10 mg via ORAL
  Filled 2017-05-03: qty 1
  Filled 2017-05-03 (×3): qty 2

## 2017-05-03 MED ORDER — ACETAMINOPHEN 325 MG PO TABS: 650.0000 mg | ORAL_TABLET | Freq: Four times a day (QID) | ORAL | Status: DC | PRN

## 2017-05-03 NOTE — Progress Notes (Signed)
1 Day Post-Op   Subjective/Chief Complaint: Patient is still quite nauseated, requiring IV pain meds Epigastric tenderness mostly   Objective: Vital signs in last 24 hours: Temp:  [97.5 F (36.4 C)-100 F (37.8 C)] 100 F (37.8 C) (02/02 0945) Pulse Rate:  [53-79] 79 (02/02 0945) Resp:  [11-22] 14 (02/02 0945) BP: (112-146)/(60-102) 135/85 (02/02 0945) SpO2:  [98 %-100 %] 100 % (02/02 0945) Last BM Date: (pt reports 3 days ago)  Intake/Output from previous day: 02/01 0701 - 02/02 0700 In: 2288.3 [P.O.:360; I.V.:1878.3; IV Piggyback:50] Out: 150 [Urine:100; Blood:50] Intake/Output this shift: Total I/O In: 440 [P.O.:440] Out: 500 [Urine:500]  General appearance: alert, cooperative and no distress Resp: clear to auscultation bilaterally Cardio: regular rate and rhythm, S1, S2 normal, no murmur, click, rub or gallop GI: obese, soft, tender in epigastrium and near umbilicus Incisions c/d/i  Lab Results:  Recent Labs    05/02/17 1014  WBC 3.9*  HGB 14.4  HCT 42.9  PLT 171   BMET Recent Labs    05/02/17 1014  NA 141  K 4.0  CL 108  CO2 25  GLUCOSE 100*  BUN 13  CREATININE 0.72  CALCIUM 9.1   PT/INR No results for input(s): LABPROT, INR in the last 72 hours. ABG No results for input(s): PHART, HCO3 in the last 72 hours.  Invalid input(s): PCO2, PO2  Studies/Results: Dg Cholangiogram Operative  Result Date: 05/02/2017 CLINICAL DATA:  Gallstones EXAM: INTRAOPERATIVE CHOLANGIOGRAM TECHNIQUE: Cholangiographic images from the C-arm fluoroscopic device were submitted for interpretation post-operatively. Please see the procedural report for the amount of contrast and the fluoroscopy time utilized. COMPARISON:  None. FINDINGS: Contrast fills the biliary tree and duodenum. There are no filling defects in the common bile duct. IMPRESSION: Patent biliary tree. Electronically Signed   By: Marybelle Killings M.D.   On: 05/02/2017 14:46   Dg Knee Complete 4 Views  Left  Result Date: 05/03/2017 CLINICAL DATA:  Acute left knee pain after fall today. EXAM: LEFT KNEE - COMPLETE 4+ VIEW COMPARISON:  Radiographs of March 07, 2012. FINDINGS: Status post left total knee arthroplasty. The femoral and tibial components appear to be well situated. No fracture or dislocation is noted. No joint effusion is noted. IMPRESSION: Status post left total knee arthroplasty. No acute abnormality seen in the left knee. Electronically Signed   By: Marijo Conception, M.D.   On: 05/03/2017 02:57    Anti-infectives: Anti-infectives (From admission, onward)   Start     Dose/Rate Route Frequency Ordered Stop   05/02/17 1800  cefTRIAXone (ROCEPHIN) 2 g in dextrose 5 % 50 mL IVPB     2 g 100 mL/hr over 30 Minutes Intravenous Every 24 hours 05/02/17 1649     05/02/17 0929  ceFAZolin (ANCEF) IVPB 2g/100 mL premix     2 g 200 mL/hr over 30 Minutes Intravenous On call to O.R. 05/02/17 0929 05/02/17 1346      Assessment/Plan: s/p Procedure(s): LAPAROSCOPIC CHOLECYSTECTOMY WITH INTRAOPERATIVE CHOLANGIOGRAM (N/A) Unable to use Toradol because of solitary kidney  Encourage ambulation Hopefully home tomorrow.   LOS: 0 days    Denise Davis 05/03/2017

## 2017-05-03 NOTE — Progress Notes (Signed)
Patient still complains of pain on her left knee and reported that she had knee replacement last year.  Dr. Stark Klein was notified and ordered X ray on the left knee.

## 2017-05-03 NOTE — Progress Notes (Signed)
Patient was walking in the hallway with assistance when she suddenly felt weak on her knees ,she was able to hold onto  the hand rails and was assisted on her armpit and back . Patient was rolled back into her room via wheelchair patient complains of pain on her left knee , ice bag was applied. We will continue to monitor

## 2017-05-04 DIAGNOSIS — K8010 Calculus of gallbladder with chronic cholecystitis without obstruction: Secondary | ICD-10-CM | POA: Diagnosis not present

## 2017-05-04 MED ORDER — OXYCODONE HCL 5 MG PO TABS: 5.0000 mg | ORAL_TABLET | Freq: Four times a day (QID) | ORAL | 0 refills | Status: DC | PRN

## 2017-05-04 NOTE — Discharge Summary (Signed)
Physician Discharge Summary  Patient ID: Denise Davis MRN: [id number] DOB/AGE: 52-Feb-1967 52 y.o.  Admit date: 05/02/2017 Discharge date: 05/04/2017  Admission Diagnoses:  Chronic calculus cholecystitis  Discharge Diagnoses: same Active Problems:   S/P laparoscopic cholecystectomy   Discharged Condition: good  Hospital Course: Lap chole with IOC on 05/02/17.  Had significant pain management issues that required extended stay.  Feeling much better on POD #2.  Tolerating diet.  PO pain meds.    Treatments: surgery: lap chole with IOC  Discharge Exam: Blood pressure (!) 115/54, pulse (!) 59, temperature 97.8 F (36.6 C), temperature source Oral, resp. rate 16, height 5\' 5"  (1.651 m), weight 99.3 kg (219 lb), SpO2 100 %. General appearance: alert, cooperative and no distress GI: obese, soft, tender around incisions Incisions c/d/i  Disposition: 01-Home or Self Care  Discharge Instructions    Call MD for:  persistant nausea and vomiting   Complete by:  As directed    Call MD for:  redness, tenderness, or signs of infection (pain, swelling, redness, odor or green/yellow discharge around incision site)   Complete by:  As directed    Call MD for:  severe uncontrolled pain   Complete by:  As directed    Call MD for:  temperature >100.4   Complete by:  As directed    Diet general   Complete by:  As directed    Driving Restrictions   Complete by:  As directed    Do not drive while taking pain medications   Increase activity slowly   Complete by:  As directed    May shower / Bathe   Complete by:  As directed      Allergies as of 05/04/2017      Reactions   Lisinopril Cough      Medication List    STOP taking these medications   HYDROcodone-acetaminophen 5-325 MG tablet Commonly known as:  NORCO/VICODIN     TAKE these medications   amoxicillin-clavulanate 875-125 MG tablet Commonly known as:  AUGMENTIN Take 1 tablet by mouth. For dental   aspirin 81 MG chewable  tablet Chew 81 mg by mouth daily.   calcium-vitamin D 250-100 MG-UNIT tablet Take 1 tablet by mouth 2 (two) times daily. Reported on 06/16/2015   carvedilol 12.5 MG tablet Commonly known as:  COREG Take 1 tablet (12.5 mg total) by mouth 2 (two) times daily with a meal.   dexlansoprazole 60 MG capsule Commonly known as:  DEXILANT Take 1 capsule (60 mg total) by mouth daily.   gabapentin 300 MG capsule Commonly known as:  NEURONTIN Take 8 capsules (2,400 mg total) by mouth at bedtime. What changed:  additional instructions   loratadine 10 MG tablet Commonly known as:  CLARITIN Take 1 tablet (10 mg total) by mouth daily.   losartan 100 MG tablet Commonly known as:  COZAAR TAKE ONE TABLET BY MOUTH ONCE DAILY   Melatonin 3 MG Tabs Take 9 mg by mouth.   mirtazapine 15 MG tablet Commonly known as:  REMERON Take 1 tablet (15 mg total) by mouth at bedtime.   multivitamin with minerals tablet Take 1 tablet by mouth daily. Reported on 06/16/2015   nitroGLYCERIN 0.4 MG SL tablet Commonly known as:  NITROSTAT Place 1 tablet (0.4 mg total) under the tongue every 5 (five) minutes as needed for chest pain. (may repeat every 5 minutes but seek medical help if pain persists after 3 tablets)   ondansetron 4 MG disintegrating tablet Commonly known as:  ZOFRAN-ODT DISSOLVE ONE TABLET IN MOUTH EVERY 8 HOURS AS NEEDED FOR NAUSEA OR VOMITING.   oxyCODONE 5 MG immediate release tablet Commonly known as:  Oxy IR/ROXICODONE Take 1 tablet (5 mg total) by mouth every 6 (six) hours as needed for moderate pain.   promethazine 25 MG tablet Commonly known as:  PHENERGAN TAKE 1 TABLET BY MOUTH EVERY 8 HOURS AS NEEDED FOR NAUSEA OR VOMITING   QUEtiapine 100 MG tablet Commonly known as:  SEROQUEL TAKE 1 TABLET BY MOUTH AT BEDTIME   sertraline 100 MG tablet Commonly known as:  ZOLOFT Take 2 tablets (200 mg total) by mouth at bedtime.   traZODone 100 MG tablet Commonly known as:  DESYREL Take  200 mg by mouth at bedtime.   triamcinolone cream 0.1 % Commonly known as:  KENALOG APPLY TOPICALLY TWO TIMES DAILY   VENTOLIN HFA 108 (90 Base) MCG/ACT inhaler Generic drug:  albuterol Inhale 2 puffs into the lungs every 6 (six) hours as needed for wheezing or shortness of breath.   vitamin B-12 500 MCG tablet Commonly known as:  CYANOCOBALAMIN Take by mouth.   Vitamin D-3 1000 units Caps Take 1 capsule (1,000 Units total) by mouth daily. Reported on 06/16/2015      Follow-up Information    Johnathan Hausen, MD Follow up.   Specialty:  General Surgery Contact information: [address] [place] [address] [phone]           Signed: Maia Petties 05/04/2017, 8:05 AM

## 2017-05-04 NOTE — Discharge Instructions (Signed)
[facility] ______[facility] °LAPAROSCOPIC SURGERY: POST OP INSTRUCTIONS °Always review your discharge instruction sheet given to you by the facility where your surgery was performed. °IF YOU HAVE DISABILITY OR FAMILY LEAVE FORMS, YOU MUST BRING THEM TO THE OFFICE FOR PROCESSING.   °DO NOT GIVE THEM TO YOUR DOCTOR. ° °1. A prescription for pain medication may be given to you upon discharge.  Take your pain medication as prescribed, if needed.  If narcotic pain medicine is not needed, then you may take acetaminophen (Tylenol) or ibuprofen (Advil) as needed. °2. Take your usually prescribed medications unless otherwise directed. °3. If you need a refill on your pain medication, please contact your pharmacy.  They will contact our office to request authorization. Prescriptions will not be filled after 5pm or on week-ends. °4. You should follow a light diet the first few days after arrival home, such as soup and crackers, etc.  Be sure to include lots of fluids daily. °5. Most patients will experience some swelling and bruising in the area of the incisions.  Ice packs will help.  Swelling and bruising can take several days to resolve.  °6. It is common to experience some constipation if taking pain medication after surgery.  Increasing fluid intake and taking a stool softener (such as Colace) will usually help or prevent this problem from occurring.  A mild laxative (Milk of Magnesia or Miralax) should be taken according to package instructions if there are no bowel movements after 48 hours. °7. Unless discharge instructions indicate otherwise, you may remove your bandages 24-48 hours after surgery, and you may shower at that time.  You may have steri-strips (small skin tapes) in place directly over the incision.  These strips should be left on the skin for 7-10 days.  If your surgeon used skin glue on the incision, you may shower in 24 hours.  The glue will flake off over the next 2-3 weeks.  Any sutures or  staples will be removed at the office during your follow-up visit. °8. ACTIVITIES:  You may resume regular (light) daily activities beginning the next day--such as daily self-care, walking, climbing stairs--gradually increasing activities as tolerated.  You may have sexual intercourse when it is comfortable.  Refrain from any heavy lifting or straining until approved by your doctor. °a. You may drive when you are no longer taking prescription pain medication, you can comfortably wear a seatbelt, and you can safely maneuver your car and apply brakes. °b. RETURN TO WORK:  __________________________________________________________ °9. You should see your doctor in the office for a follow-up appointment approximately 2-3 weeks after your surgery.  Make sure that you call for this appointment within a day or two after you arrive home to insure a convenient appointment time. °10. OTHER INSTRUCTIONS: __________________________________________________________________________________________________________________________ __________________________________________________________________________________________________________________________ °WHEN TO CALL YOUR DOCTOR: °1. Fever over 101.0 °2. Inability to urinate °3. Continued bleeding from incision. °4. Increased pain, redness, or drainage from the incision. °5. Increasing abdominal pain ° °The clinic staff is available to answer your questions during regular business hours.  Please don’t hesitate to call and ask to speak to one of the nurses for clinical concerns.  If you have a medical emergency, go to the nearest emergency room or call 911.  A surgeon from [facility] is always on call at the hospital. °[address], [address], [place], [place]  [address] ? [address] [address], [place], [place]   [address] °[phone] ? [phone] ? FAX [phone] °Web site:   [email or web address] °

## 2017-05-04 NOTE — Progress Notes (Signed)
Discharge instructions reviewed with patient. Questions answered. Patient denies further questions. No prescriptions given, as pain medication was called in to her pharmacy. Patient is calling someone on phone to come and drive her home. Donne Hazel, RN

## 2017-05-05 NOTE — Anesthesia Postprocedure Evaluation (Signed)
Anesthesia Post Note  Patient: Denise Davis  Procedure(s) Performed: LAPAROSCOPIC CHOLECYSTECTOMY WITH INTRAOPERATIVE CHOLANGIOGRAM (N/A )     Patient location during evaluation: PACU Anesthesia Type: General Level of consciousness: awake and alert Pain management: pain level controlled Vital Signs Assessment: post-procedure vital signs reviewed and stable Respiratory status: spontaneous breathing, nonlabored ventilation, respiratory function stable and patient connected to nasal cannula oxygen Cardiovascular status: blood pressure returned to baseline and stable Postop Assessment: no apparent nausea or vomiting Anesthetic complications: no    Last Vitals:  Vitals:   05/04/17 0235 05/04/17 0656  BP: 108/62 (!) 115/54  Pulse: (!) 58 (!) 59  Resp: 16 16  Temp: 36.5 C 36.6 C  SpO2: 100% 100%    Last Pain:  Vitals:   05/04/17 0750  TempSrc:   PainSc: Asleep   Pain Goal: Patients Stated Pain Goal: 2 (05/03/17 2022)               Audry Pili

## 2017-05-06 ENCOUNTER — Telehealth: Payer: Self-pay | Admitting: Family Medicine

## 2017-05-07 ENCOUNTER — Ambulatory Visit (HOSPITAL_COMMUNITY)
Admission: RE | Admit: 2017-05-07 | Discharge: 2017-05-07 | Disposition: A | Discharge status: Home / Self Care | Payer: PPO | Source: Ambulatory Visit | Attending: Surgery | Admitting: Surgery

## 2017-05-07 ENCOUNTER — Other Ambulatory Visit: Payer: Self-pay | Admitting: Surgery

## 2017-05-07 DIAGNOSIS — E46 Unspecified protein-calorie malnutrition: Secondary | ICD-10-CM | POA: Diagnosis not present

## 2017-05-07 DIAGNOSIS — R112 Nausea with vomiting, unspecified: Secondary | ICD-10-CM | POA: Diagnosis not present

## 2017-05-07 MED ORDER — DEXTROSE IN LACTATED RINGERS 5 % IV SOLN: INTRAVENOUS | Status: DC

## 2017-05-07 MED ORDER — THIAMINE HCL 100 MG/ML IJ SOLN
Freq: Once | INTRAVENOUS | Status: AC
  Administered 2017-05-07: 15:00:00 via INTRAVENOUS
  Filled 2017-05-07: qty 1000

## 2017-05-07 MED ORDER — ONDANSETRON 4 MG PO TBDP: 4.0000 mg | ORAL_TABLET | ORAL | Status: DC | PRN

## 2017-05-07 MED ORDER — ONDANSETRON HCL 4 MG/2ML IJ SOLN: 4.0000 mg | INTRAMUSCULAR | Status: DC | PRN

## 2017-05-07 NOTE — Progress Notes (Signed)
                          Patient Care Center Note   Diagnosis: Malnutrition   Provider: Zenaida Niece, MD   Procedure: 1L Banana Bag   Note:  [name] came into the Patient Denise Davis and received 1L Banana Bag via PIV. Patient tolerated well.  Discharge instructions given and verbalized understanding.  Patient alert, orient, verbal and ambulatory at time of discharge.   Otho Bellows, RN

## 2017-05-07 NOTE — Discharge Instructions (Signed)
Banana bag (thiamine 863 mg, folic acid 1 mg, multivitamin 10 ml in 1L of sodium chloride)

## 2017-05-08 ENCOUNTER — Ambulatory Visit: Payer: PPO | Admitting: Neurology

## 2017-05-08 ENCOUNTER — Encounter: Payer: Self-pay | Admitting: Neurology

## 2017-05-08 VITALS — BP 128/85 | HR 89 | Ht 65.0 in | Wt 221.0 lb

## 2017-05-08 DIAGNOSIS — R569 Unspecified convulsions: Secondary | ICD-10-CM | POA: Diagnosis not present

## 2017-05-08 MED ORDER — LAMOTRIGINE 25 MG PO TABS: 25.0000 mg | ORAL_TABLET | Freq: Every day | ORAL | 0 refills | Status: DC

## 2017-05-08 MED ORDER — LAMOTRIGINE 100 MG PO TABS: 100.0000 mg | ORAL_TABLET | Freq: Every day | ORAL | 11 refills | Status: DC

## 2017-05-08 NOTE — Progress Notes (Signed)
PATIENT: Denise Davis DOB: 07-Nov-1965  Chief Complaint  Patient presents with  . Possible past seizures    Reports three past black out events where it was thought to be seizures.  She was placed on Keppra and took it for two years prior to stopping on her own.  She has been off medication at least one year.  Recently,  she has noticed intermittent tingling in her lips, headaches and hearing an "electrical charge" in her head.  These same symptoms occurred prior to her black out events years ago.    Marland Kitchen PCP    Leone Haven, MD     HISTORICAL  Denise Davis is a 52 year old female, seen in refer by her primary care doctor Leone Haven, for evaluation of possible seizure, initial evaluation was on May 08, 2017.  She had a past medical history of hypertension, hyperlipidemia, renal cell carcinoma, s/p left nephrectomy in 2009, it was a incidental finding, does not need chemoradiation therapy.  She began to have seizures since 2015, she described as generalized tonic-clonic seizure, when she came around, she was confused, she had a total of 4-5 episode in the first 12 months, was put on Keppra 500 twice daily, she has no recurrent seizure activity, she stopped the [place] since 2018.  Then she began to have recurrent spells of sudden onset energy surge in her brain, transient dizziness, vertigo, mild nausea, lasting for a few seconds, but multiple recurrent episode in the day, no loss of consciousness,  This is in the setting of extreme stress, her husband passed away in summer 2018.  She also experienced similar transient recurrent episode prior to her seizure onset in 2015,  She is also on polypharmacy treatment for her depression   REVIEW OF SYSTEMS: Full 14 system review of systems performed and notable only for blurred vision, fatigue, headaches, dizziness, insomnia, sleepiness, depression, not enough sleep, disinterested in activities,  allergy  ALLERGIES: Allergies  Allergen Reactions  . Lisinopril Cough    HOME MEDICATIONS: Current Outpatient Medications  Medication Sig Dispense Refill  . albuterol (VENTOLIN HFA) 108 (90 Base) MCG/ACT inhaler Inhale 2 puffs into the lungs every 6 (six) hours as needed for wheezing or shortness of breath.    Marland Kitchen amoxicillin-clavulanate (AUGMENTIN) 875-125 MG tablet Take 1 tablet by mouth. For dental    . aspirin 81 MG chewable tablet Chew 81 mg by mouth daily.    . calcium-vitamin D 250-100 MG-UNIT tablet Take 1 tablet by mouth 2 (two) times daily. Reported on 06/16/2015 180 tablet 3  . carvedilol (COREG) 12.5 MG tablet Take 1 tablet (12.5 mg total) by mouth 2 (two) times daily with a meal. 60 tablet 6  . Cholecalciferol (VITAMIN D-3) 1000 units CAPS Take 1 capsule (1,000 Units total) by mouth daily. Reported on 06/16/2015 90 capsule 3  . dexlansoprazole (DEXILANT) 60 MG capsule Take 1 capsule (60 mg total) by mouth daily. 30 capsule 6  . gabapentin (NEURONTIN) 300 MG capsule Take 8 capsules (2,400 mg total) by mouth at bedtime. (Patient taking differently: Take 2,400 mg by mouth at bedtime. Takes 600 AM, 1200 PM) 240 capsule 3  . loratadine (CLARITIN) 10 MG tablet Take 1 tablet (10 mg total) by mouth daily. 90 tablet 3  . losartan (COZAAR) 100 MG tablet TAKE ONE TABLET BY MOUTH ONCE DAILY 90 tablet 3  . Melatonin 3 MG TABS Take 9 mg by mouth.     . mirtazapine (REMERON) 15 MG tablet  Take 1 tablet (15 mg total) by mouth at bedtime. 90 tablet 0  . Multiple Vitamins-Minerals (MULTIVITAMIN WITH MINERALS) tablet Take 1 tablet by mouth daily. Reported on 06/16/2015    . nitroGLYCERIN (NITROSTAT) 0.4 MG SL tablet Place 1 tablet (0.4 mg total) under the tongue every 5 (five) minutes as needed for chest pain. (may repeat every 5 minutes but seek medical help if pain persists after 3 tablets) 25 tablet 0  . ondansetron (ZOFRAN-ODT) 4 MG disintegrating tablet DISSOLVE ONE TABLET IN MOUTH EVERY 8 HOURS AS  NEEDED FOR NAUSEA OR VOMITING. 30 tablet 3  . oxyCODONE (OXY IR/ROXICODONE) 5 MG immediate release tablet Take 1 tablet (5 mg total) by mouth every 6 (six) hours as needed for moderate pain. 20 tablet 0  . promethazine (PHENERGAN) 25 MG tablet TAKE 1 TABLET BY MOUTH EVERY 8 HOURS AS NEEDED FOR NAUSEA OR VOMITING 30 tablet 2  . QUEtiapine (SEROQUEL) 100 MG tablet TAKE 1 TABLET BY MOUTH AT BEDTIME 90 tablet 0  . sertraline (ZOLOFT) 100 MG tablet Take 2 tablets (200 mg total) by mouth at bedtime. 180 tablet 1  . traZODone (DESYREL) 100 MG tablet Take 200 mg by mouth at bedtime.    . triamcinolone cream (KENALOG) 0.1 % APPLY TOPICALLY TWO TIMES DAILY 30 g 0  . vitamin B-12 (CYANOCOBALAMIN) 500 MCG tablet Take by mouth.     No current facility-administered medications for this visit.     PAST MEDICAL HISTORY: Past Medical History:  Diagnosis Date  . Allergic rhinitis   . Anxiety   . Depression   . Diabetes mellitus    taken off meds in beginning fo 2018 dueto weight loss   . DVT of leg (deep venous thrombosis) ([place]) 12/2007   left leg, given warfarin for 6 mos  . GERD (gastroesophageal reflux disease)   . H/O hiatal hernia   . Hyperlipidemia   . Hypertension   . Morbid obesity ([place])   . Pericarditis    Diagnosed/treated at [facility]; per patient 4 episodes per cardiologist Dr. Edwin Dada persistant chest pain unclear if pericarditis verusus neuropsychogenic   . Renal cell cancer ([place]) 2009   S/p nephrectomy (left)  . Seizure disorder ([place])    (04/08/17)- None in over 1 yr, no meds.    PAST SURGICAL HISTORY: Past Surgical History:  Procedure Laterality Date  . CHOLECYSTECTOMY N/A 05/02/2017   Procedure: LAPAROSCOPIC CHOLECYSTECTOMY WITH INTRAOPERATIVE CHOLANGIOGRAM;  Surgeon: Johnathan Hausen, MD;  Location: [facility];  Service: General;  Laterality: N/A;  . ENDOMETRIAL ABLATION  2007  . ESOPHAGEAL MANOMETRY N/A 05/21/2016   Procedure: ESOPHAGEAL MANOMETRY (EM);  Surgeon: Lucilla Lame, MD;   Location: [facility] END[facility]COPY;  Service: Endoscopy;  Laterality: N/A;  . ESOPHAGOGASTRODUODENOSCOPY (EGD) WITH PROPOFOL N/A 05/14/2016   Procedure: ESOPHAGOGASTRODUODENOSCOPY (EGD) WITH PROPOFOL;  Surgeon: Lucilla Lame, MD;  Location: [facility] ENDOSCOPY;  Service: Endoscopy;  Laterality: N/A;  . ESOPHAGOGASTRODUODENOSCOPY (EGD) WITH PROPOFOL N/A 04/10/2017   Procedure: ESOPHAGOGASTRODUODENOSCOPY (EGD) WITH PROPOFOL;  Surgeon: Lucilla Lame, MD;  Location: [place] [facility];  Service: Endoscopy;  Laterality: N/A;  . INSERTION OF MESH  2014  . LEFT HEART CATHETERIZATION WITH CORONARY ANGIOGRAM N/A 08/30/2013   Procedure: LEFT HEART CATHETERIZATION WITH CORONARY ANGIOGRAM;  Surgeon: Troy Sine, MD;  Location: [facility] CATH LAB;  Service: Cardiovascular;  Laterality: N/A;  . NEPHRECTOMY    . REPLACEMENT TOTAL KNEE BILATERAL Bilateral 2018   2/18, 5/18 [facility]  . ROUX-EN-Y GASTRIC BYPASS  04/27/2015   [facility]  . TUBAL  LIGATION    . UMBILICAL HERNIA REPAIR      FAMILY HISTORY: Family History  Problem Relation Age of Onset  . Hypertension Mother   . Breast cancer Mother 43  . Arthritis Mother   . Heart disease Paternal Grandmother   . Alcoholism Father   . Hyperlipidemia Unknown        Parent  . Mental illness Unknown        Parent  . Diabetes Unknown        Parent    SOCIAL HISTORY:  Social History   Socioeconomic History  . Marital status: Widowed    Spouse name: Not on file  . Number of children: 2  . Years of education: 71  . Highest education level: High school graduate  Social Needs  . Financial resource strain: Not on file  . Food insecurity - worry: Not on file  . Food insecurity - inability: Not on file  . Transportation needs - medical: Not on file  . Transportation needs - non-medical: Not on file  Occupational History  . Occupation: Disabled  Tobacco Use  . Smoking status: Former Smoker    Types: Cigarettes    Last attempt to quit: 1984    Years since quitting: 35.1  . Smokeless  tobacco: Never Used  Substance and Sexual Activity  . Alcohol use: No    Frequency: Never  . Drug use: No  . Sexual activity: Not Currently  Other Topics Concern  . 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   Right-handed.   One cup caffeine per day.           PHYSICAL EXAM   Vitals:   05/08/17 0936  BP: 128/85  Pulse: 89  Weight: 221 lb (100.2 kg)  Height: 5\' 5"  (1.651 m)    Not recorded      Body mass index is 36.78 kg/m.  PHYSICAL EXAMNIATION:  Gen: NAD, conversant, well nourised, obese, well groomed                     Cardiovascular: Regular rate rhythm, no peripheral edema, warm, nontender. Eyes: Conjunctivae clear without exudates or hemorrhage Neck: Supple, no carotid bruits. Pulmonary: Clear to auscultation bilaterally   NEUROLOGICAL EXAM:  MENTAL STATUS: Speech:    Speech is normal; fluent and spontaneous with normal comprehension.  Cognition:     Orientation to time, place and person     Normal recent and remote memory     Normal Attention span and concentration     Normal Language, naming, repeating,spontaneous speech     Fund of knowledge   CRANIAL NERVES: CN II: Visual fields are full to confrontation. Fundoscopic exam is normal with sharp discs and no vascular changes. Pupils are round equal and briskly reactive to light. CN III, IV, VI: extraocular movement are normal. No ptosis. CN V: Facial sensation is intact to pinprick in all 3 divisions bilaterally. Corneal responses are intact.  CN VII: Face is symmetric with normal eye closure and smile. CN VIII: Hearing is normal to rubbing fingers CN IX, X: Palate elevates symmetrically. Phonation is normal. CN XI: Head turning and shoulder shrug are intact CN XII: Tongue is midline with normal movements and no atrophy.  MOTOR: There is no pronator drift  of out-stretched arms. Muscle bulk and tone  are normal. Muscle strength is normal.  REFLEXES: Reflexes are 2+ and symmetric at the biceps, triceps, knees, and ankles. Plantar responses are flexor.  SENSORY: Intact to light touch, pinprick, positional sensation and vibratory sensation are intact in fingers and toes.  COORDINATION: Rapid alternating movements and fine finger movements are intact. There is no dysmetria on finger-to-nose and heel-knee-shin.    GAIT/STANCE: Posture is normal. Gait is steady with normal steps, base, arm swing, and turning. Heel and toe walking are normal. Tandem gait is normal.  Romberg is absent.   DIAGNOSTIC DATA (LABS, IMAGING, TESTING) - I reviewed patient records, labs, notes, testing and imaging myself where available.   ASSESSMENT AND PLAN  YESLIN DELIO is a 52 y.o. female   Seizure-like event  Complete evaluation with MRI of the brain with and without contrast  EEG  Starting with lamotrigine titrating to 100 mg twice a day  Marcial Pacas, M.D. Ph.D.  [facility] [address] [place], [place] [address] Ph: [phone] Fax: [phone]  CC: Leone Haven, MD

## 2017-05-12 ENCOUNTER — Ambulatory Visit: Payer: Self-pay | Admitting: Internal Medicine

## 2017-05-12 NOTE — Progress Notes (Deleted)
[facility] Consultation      Assessment and Plan:  Patient is a 52 year old female with a history of asthma, vocal cord dysfunction, history of bariatric surgery, now presents with increasing dyspnea and chest congestion.  Persistent asthma with exacerbation. -The patient has been off her inhalers for several months. -She is given a sample of Symbicort 160 to be used 2 puffs twice daily today. When she runs out of the sample. I asked her to switch back to her usual Qvar, 2 puffs twice daily and use her pro-air as needed. -She is asked to follow up/call back if she does not improve in the next few weeks. She may require further workup of her dyspnea, which may include an echocardiogram.  History of anxiety and vocal cord dysfunction. -She is going through multiple life stressors, including the recent death of her husband, and the hospitalization of her granddaughter, which may be contributing to her asthma/anxiety.    Date: 05/12/2017  MRN# [id number] Denise Davis [birth year]   Denise Davis is a 52 y.o. old female seen in consultation for chief complaint of:    No chief complaint on file.   HPI:  Patient was last seen with symptoms of chronic bronchitis and excess secretions which she had trouble coughing up.  Was also noted that she had atypical symptoms of dyspnea, which improved when drinking cold drinks.  It was noted that the symptoms occurred after her husband had passed away.  Since last visit she is undergone a laparoscopic cholecystectomy.  At last visit she was given a sample of Symbicort inhaler to be used 2 puffs twice daily, and asked to switch back to Qvar twice daily when he ran out.  **CT chest 12/20/16; there are scattered bibasal, dependent areas of ground glass changes, which may be suggestive of mild pulmonary edema, mild bibasilar atelectasis. Otherwise unremarkable.  **On review of old notes, the patient was seen at [facility], she was  found to have dyspnea on exertion, thought to be due to morbid obesity. She was planned for bariatric surgery, BMI at that time was 61.Marland Kitchen She was also tried in the past on qvar and albuterol and noted improvement. She has ahistory  OSA, poorly compliant with  PAP, vocal code dysfunction, panic disorder.   Social Hx:   Social History   Tobacco Use  . Smoking status: Former Smoker    Types: Cigarettes    Last attempt to quit: 1984    Years since quitting: 35.1  . Smokeless tobacco: Never Used  Substance Use Topics  . Alcohol use: No    Frequency: Never  . Drug use: No   Medication:    Current Outpatient Medications:  .  albuterol (VENTOLIN HFA) 108 (90 Base) MCG/ACT inhaler, Inhale 2 puffs into the lungs every 6 (six) hours as needed for wheezing or shortness of breath., Disp: , Rfl:  .  amoxicillin-clavulanate (AUGMENTIN) 875-125 MG tablet, Take 1 tablet by mouth. For dental, Disp: , Rfl:  .  aspirin 81 MG chewable tablet, Chew 81 mg by mouth daily., Disp: , Rfl:  .  calcium-vitamin D 250-100 MG-UNIT tablet, Take 1 tablet by mouth 2 (two) times daily. Reported on 06/16/2015, Disp: 180 tablet, Rfl: 3 .  carvedilol (COREG) 12.5 MG tablet, Take 1 tablet (12.5 mg total) by mouth 2 (two) times daily with a meal., Disp: 60 tablet, Rfl: 6 .  Cholecalciferol (VITAMIN D-3) 1000 units CAPS, Take 1 capsule (1,000 Units total) by mouth daily.  Reported on 06/16/2015, Disp: 90 capsule, Rfl: 3 .  dexlansoprazole (DEXILANT) 60 MG capsule, Take 1 capsule (60 mg total) by mouth daily., Disp: 30 capsule, Rfl: 6 .  gabapentin (NEURONTIN) 300 MG capsule, Take 8 capsules (2,400 mg total) by mouth at bedtime. (Patient taking differently: Take 2,400 mg by mouth at bedtime. Takes 600 AM, 1200 PM), Disp: 240 capsule, Rfl: 3 .  lamoTRIgine (LAMICTAL) 100 MG tablet, Take 1 tablet (100 mg total) by mouth daily., Disp: 60 tablet, Rfl: 11 .  lamoTRIgine (LAMICTAL) 25 MG tablet, Take 1 tablet (25 mg total) by mouth daily. 1  tablet twice a day for the first week 2 tablets twice a day for the second week 3 tablets twice a day for the third week 4 tablets twice a day for the fourth week  For total of 140 tablets  After finish titration with small dose of lamotrigine 25 mg, change to lamotrigine 100 mg twice a day, Disp: 140 tablet, Rfl: 0 .  loratadine (CLARITIN) 10 MG tablet, Take 1 tablet (10 mg total) by mouth daily., Disp: 90 tablet, Rfl: 3 .  losartan (COZAAR) 100 MG tablet, TAKE ONE TABLET BY MOUTH ONCE DAILY, Disp: 90 tablet, Rfl: 3 .  Melatonin 3 MG TABS, Take 9 mg by mouth. , Disp: , Rfl:  .  mirtazapine (REMERON) 15 MG tablet, Take 1 tablet (15 mg total) by mouth at bedtime., Disp: 90 tablet, Rfl: 0 .  Multiple Vitamins-Minerals (MULTIVITAMIN WITH MINERALS) tablet, Take 1 tablet by mouth daily. Reported on 06/16/2015, Disp: , Rfl:  .  nitroGLYCERIN (NITROSTAT) 0.4 MG SL tablet, Place 1 tablet (0.4 mg total) under the tongue every 5 (five) minutes as needed for chest pain. (may repeat every 5 minutes but seek medical help if pain persists after 3 tablets), Disp: 25 tablet, Rfl: 0 .  ondansetron (ZOFRAN-ODT) 4 MG disintegrating tablet, DISSOLVE ONE TABLET IN MOUTH EVERY 8 HOURS AS NEEDED FOR NAUSEA OR VOMITING., Disp: 30 tablet, Rfl: 3 .  oxyCODONE (OXY IR/ROXICODONE) 5 MG immediate release tablet, Take 1 tablet (5 mg total) by mouth every 6 (six) hours as needed for moderate pain., Disp: 20 tablet, Rfl: 0 .  promethazine (PHENERGAN) 25 MG tablet, TAKE 1 TABLET BY MOUTH EVERY 8 HOURS AS NEEDED FOR NAUSEA OR VOMITING, Disp: 30 tablet, Rfl: 2 .  QUEtiapine (SEROQUEL) 100 MG tablet, TAKE 1 TABLET BY MOUTH AT BEDTIME, Disp: 90 tablet, Rfl: 0 .  sertraline (ZOLOFT) 100 MG tablet, Take 2 tablets (200 mg total) by mouth at bedtime., Disp: 180 tablet, Rfl: 1 .  traZODone (DESYREL) 100 MG tablet, Take 200 mg by mouth at bedtime., Disp: , Rfl:  .  triamcinolone cream (KENALOG) 0.1 %, APPLY TOPICALLY TWO TIMES DAILY, Disp: 30 g,  Rfl: 0 .  vitamin B-12 (CYANOCOBALAMIN) 500 MCG tablet, Take by mouth., Disp: , Rfl:    Allergies:  Lisinopril  Review of Systems: Gen:  Denies  fever, sweats, chills HEENT: Denies blurred vision, double vision. bleeds, sore throat Cvc:  No dizziness, chest pain. Resp:   Denies cough or sputum production, shortness of breath Gi: Denies swallowing difficulty, stomach pain. Gu:  Denies bladder incontinence, burning urine Ext:   No Joint pain, stiffness. Skin: No skin rash,  hives  Endoc:  No polyuria, polydipsia. Psych: No depression, insomnia. Other:  All other systems were reviewed with the patient and were negative other that what is mentioned in the HPI.   Physical Examination:   VS: There were  no vitals taken for this visit.  General Appearance: No distress  Neuro:without focal findings,  speech normal,  HEENT: PERRLA, EOM intact.   Pulmonary: normal breath sounds, scattered expiratory rhonchi.  CardiovascularNormal S1,S2.  No m/r/g.   Abdomen: Benign, Soft, non-tender. Renal:  No costovertebral tenderness  GU:  No performed at this time. Endoc: No evident thyromegaly, no signs of acromegaly. Skin:   warm, no rashes, no ecchymosis  Extremities: normal, no cyanosis, clubbing.  Other findings:    LABORATORY PANEL:   CBC No results for input(s): WBC, HGB, HCT, PLT in the last 168 hours. ------------------------------------------------------------------------------------------------------------------  Chemistries  No results for input(s): NA, K, CL, CO2, GLUCOSE, BUN, CREATININE, CALCIUM, MG, AST, ALT, ALKPHOS, BILITOT in the last 168 hours.  Invalid input(s): GFRCGP ------------------------------------------------------------------------------------------------------------------  Cardiac Enzymes No results for input(s): TROPONINI in the last 168 hours. ------------------------------------------------------------  RADIOLOGY:  No results found.     Thank  you  for the consultation and for allowing [facility] to assist in the care of your patient. Our recommendations are noted above.  Please contact us if we can be of further service.   Marda Stalker, MD.  Board Certified in Internal Medicine, Pulmonary Medicine, [place], and Sleep Medicine.  [facility] Office Number: [phone]  Patricia Pesa, M.D.  Merton Border, M.D  05/12/2017

## 2017-05-13 ENCOUNTER — Other Ambulatory Visit: Payer: Self-pay | Admitting: Surgery

## 2017-05-13 DIAGNOSIS — Z9049 Acquired absence of other specified parts of digestive tract: Secondary | ICD-10-CM

## 2017-05-14 ENCOUNTER — Ambulatory Visit
Admission: RE | Admit: 2017-05-14 | Discharge: 2017-05-14 | Disposition: A | Discharge status: Home / Self Care | Payer: PPO | Source: Ambulatory Visit | Attending: Neurology | Admitting: Neurology

## 2017-05-14 ENCOUNTER — Other Ambulatory Visit: Payer: Self-pay | Admitting: Family Medicine

## 2017-05-14 DIAGNOSIS — R569 Unspecified convulsions: Secondary | ICD-10-CM

## 2017-05-14 MED ORDER — GADOBENATE DIMEGLUMINE 529 MG/ML IV SOLN
20.0000 mL | Freq: Once | INTRAVENOUS | Status: AC | PRN
  Administered 2017-05-14: 20 mL via INTRAVENOUS

## 2017-05-14 NOTE — Telephone Encounter (Signed)
Please check with the patient to see if she has continued to have nausea issues.  Thanks.

## 2017-05-14 NOTE — Telephone Encounter (Signed)
Last OV 01/24/17 last filled 04/11/17 30 3rf, would like 90 day supply

## 2017-05-15 NOTE — Telephone Encounter (Signed)
Noted.  Refill given.  She should keep her appointment with the surgeon and if nausea is a persistent issue we can have her go back and see GI.

## 2017-05-15 NOTE — Telephone Encounter (Signed)
Patient states she does still have nausea issues, she states it has worsened since the gall bladder removal she continues to take this continuously. Patient has a scan scheduled by DR.Hassell Done for [place].

## 2017-05-16 ENCOUNTER — Ambulatory Visit
Admission: RE | Admit: 2017-05-16 | Discharge: 2017-05-16 | Disposition: A | Discharge status: Home / Self Care | Payer: PPO | Source: Ambulatory Visit | Attending: Surgery | Admitting: Surgery

## 2017-05-16 DIAGNOSIS — D259 Leiomyoma of uterus, unspecified: Secondary | ICD-10-CM | POA: Diagnosis not present

## 2017-05-16 DIAGNOSIS — Z9049 Acquired absence of other specified parts of digestive tract: Secondary | ICD-10-CM

## 2017-05-16 MED ORDER — IOPAMIDOL (ISOVUE-300) INJECTION 61%
100.0000 mL | Freq: Once | INTRAVENOUS | Status: AC | PRN
  Administered 2017-05-16: 100 mL via INTRAVENOUS

## 2017-05-19 ENCOUNTER — Encounter: Payer: Self-pay | Admitting: Neurology

## 2017-05-21 ENCOUNTER — Other Ambulatory Visit: Payer: PPO

## 2017-05-21 ENCOUNTER — Ambulatory Visit (INDEPENDENT_AMBULATORY_CARE_PROVIDER_SITE_OTHER): Payer: PPO | Admitting: Neurology

## 2017-05-21 DIAGNOSIS — R569 Unspecified convulsions: Secondary | ICD-10-CM | POA: Diagnosis not present

## 2017-05-22 ENCOUNTER — Other Ambulatory Visit: Payer: Self-pay | Admitting: Surgery

## 2017-05-22 DIAGNOSIS — K769 Liver disease, unspecified: Secondary | ICD-10-CM

## 2017-05-23 NOTE — Telephone Encounter (Signed)
error 

## 2017-05-23 NOTE — Procedures (Signed)
   HISTORY: 52 year old female complains seizure-like activity,  TECHNIQUE:  16 channel EEG was performed based on standard 10-16 international system. One channel was dedicated to EKG, which has demonstrates normal sinus rhythm of 84 beats per minutes.  Upon awakening, the posterior background activity was well-developed, in alpha range, reactive to eye opening and closure.  There was no evidence of epileptiform discharge.  Photic stimulation was performed, which induced a symmetric photic driving.  Hyperventilation was performed, there was no abnormality elicit.  No sleep was achieved.  CONCLUSION: This is a  normal awake EEG.  There is no electrodiagnostic evidence of epileptiform discharge.  Marcial Pacas, M.D. Ph.D.  [facility] [place], [place] [address] Phone: [phone] Fax:      [phone]

## 2017-05-28 ENCOUNTER — Emergency Department
Admission: EM | Admit: 2017-05-28 | Discharge: 2017-05-29 | Disposition: A | Discharge status: Home / Self Care | Payer: PPO | Attending: Emergency Medicine | Admitting: Emergency Medicine

## 2017-05-28 ENCOUNTER — Emergency Department: Payer: PPO

## 2017-05-28 ENCOUNTER — Encounter: Payer: Self-pay | Admitting: Emergency Medicine

## 2017-05-28 ENCOUNTER — Other Ambulatory Visit: Payer: Self-pay

## 2017-05-28 DIAGNOSIS — R51 Headache: Secondary | ICD-10-CM | POA: Diagnosis not present

## 2017-05-28 DIAGNOSIS — Z79899 Other long term (current) drug therapy: Secondary | ICD-10-CM | POA: Diagnosis not present

## 2017-05-28 DIAGNOSIS — Z87891 Personal history of nicotine dependence: Secondary | ICD-10-CM | POA: Insufficient documentation

## 2017-05-28 DIAGNOSIS — E119 Type 2 diabetes mellitus without complications: Secondary | ICD-10-CM | POA: Diagnosis not present

## 2017-05-28 DIAGNOSIS — R55 Syncope and collapse: Secondary | ICD-10-CM | POA: Insufficient documentation

## 2017-05-28 DIAGNOSIS — Z7982 Long term (current) use of aspirin: Secondary | ICD-10-CM | POA: Insufficient documentation

## 2017-05-28 DIAGNOSIS — I10 Essential (primary) hypertension: Secondary | ICD-10-CM | POA: Diagnosis not present

## 2017-05-28 DIAGNOSIS — R519 Headache, unspecified: Secondary | ICD-10-CM

## 2017-05-28 LAB — BASIC METABOLIC PANEL
ANION GAP: 6 (ref 5–15)
BUN: 15 mg/dL (ref 6–20)
CALCIUM: 8.5 mg/dL — AB (ref 8.9–10.3)
CO2: 24 mmol/L (ref 22–32)
Chloride: 108 mmol/L (ref 101–111)
Creatinine, Ser: 0.79 mg/dL (ref 0.44–1.00)
Glucose, Bld: 90 mg/dL (ref 65–99)
POTASSIUM: 4.1 mmol/L (ref 3.5–5.1)
SODIUM: 138 mmol/L (ref 135–145)

## 2017-05-28 LAB — CBC WITH DIFFERENTIAL/PLATELET
BASOS ABS: 0 10*3/uL (ref 0–0.1)
BASOS PCT: 1 %
EOS PCT: 5 %
Eosinophils Absolute: 0.2 10*3/uL (ref 0–0.7)
HCT: 42.4 % (ref 35.0–47.0)
Hemoglobin: 14.2 g/dL (ref 12.0–16.0)
LYMPHS PCT: 49 %
Lymphs Abs: 2.3 10*3/uL (ref 1.0–3.6)
MCH: 31.6 pg (ref 26.0–34.0)
MCHC: 33.5 g/dL (ref 32.0–36.0)
MCV: 94.2 fL (ref 80.0–100.0)
Monocytes Absolute: 0.5 10*3/uL (ref 0.2–0.9)
Monocytes Relative: 10 %
Neutro Abs: 1.6 10*3/uL (ref 1.4–6.5)
Neutrophils Relative %: 35 %
PLATELETS: 179 10*3/uL (ref 150–440)
RBC: 4.5 MIL/uL (ref 3.80–5.20)
RDW: 15 % — AB (ref 11.5–14.5)
WBC: 4.6 10*3/uL (ref 3.6–11.0)

## 2017-05-28 LAB — TROPONIN I

## 2017-05-28 MED ORDER — PROCHLORPERAZINE EDISYLATE 5 MG/ML IJ SOLN
10.0000 mg | Freq: Once | INTRAMUSCULAR | Status: DC
  Filled 2017-05-28: qty 2

## 2017-05-28 MED ORDER — SODIUM CHLORIDE 0.9 % IV BOLUS (SEPSIS): 1000.0000 mL | Freq: Once | INTRAVENOUS | Status: DC

## 2017-05-28 NOTE — ED Provider Notes (Signed)
[facility] Emergency Department Provider Note   ____________________________________________   I have reviewed the triage vital signs and the nursing notes.   HISTORY  Chief Complaint Loss of Consciousness   History limited by: Not Limited   HPI Denise Davis is a 52 y.o. female who presents to the emergency department today because of concerns for headache and syncopal episode.  The patient states that the syncopal episode happened yesterday.  She was in the bathroom at that time.  She was with her sister.  She states that she remembers waking up in the bathtub.  Sister told her that her eyes rolled to the back of her head.  No seizure-like activity was witnessed.  Patient then developed headache.  The headache has been constant since that time yesterday.  Started in the back of her head and now has gone to a generalized headache.  It is severe.  She has not had any vision change although has had some associated nausea and vomited one time.  No fevers.   Per medical record review patient has a history of seizure disorder.  Past Medical History:  Diagnosis Date  . Allergic rhinitis   . Anxiety   . Depression   . Diabetes mellitus    taken off meds in beginning fo 2018 dueto weight loss   . DVT of leg (deep venous thrombosis) ([place]) 12/2007   left leg, given warfarin for 6 mos  . GERD (gastroesophageal reflux disease)   . H/O hiatal hernia   . Hyperlipidemia   . Hypertension   . Morbid obesity ([place])   . Pericarditis    Diagnosed/treated at [facility]; per patient 4 episodes per cardiologist Dr. Edwin Dada persistant chest pain unclear if pericarditis verusus neuropsychogenic   . Renal cell cancer ([place]) 2009   S/p nephrectomy (left)  . Seizure disorder ([place])    (04/08/17)- None in over 1 yr, no meds.    Patient Active Problem List   Diagnosis Date Noted  . Seizure-like activity ([place]) 05/08/2017  . S/P laparoscopic cholecystectomy 05/02/2017  . BPPV (benign  paroxysmal positional vertigo), right 01/24/2017  . SOB (shortness of breath) 12/18/2016  . Esophageal spasm 05/30/2016  . Nausea   . Bariatric surgery status   . Other specified diseases of esophagus   . Neuropathy 02/12/2016  . Decreased pedal pulses 02/12/2016  . Allergic rhinitis 02/12/2016  . Hypokalemia 12/08/2015  . Sleep apnea 12/01/2015  . Palpitations 12/01/2015  . Status post gastric bypass for obesity 10/03/2015  . Osteoarthritis 10/03/2015  . Anxiety and depression 02/13/2015  . Seizure disorder ([place]) 02/13/2015  . GERD (gastroesophageal reflux disease) 02/13/2015  . Right sided weakness 08/25/2013  . Vocal cord dysfunction 08/18/2012  . Chest pain 05/09/2011  . Diabetes mellitus ([place])   . Hypertension     Past Surgical History:  Procedure Laterality Date  . CHOLECYSTECTOMY N/A 05/02/2017   Procedure: LAPAROSCOPIC CHOLECYSTECTOMY WITH INTRAOPERATIVE CHOLANGIOGRAM;  Surgeon: Johnathan Hausen, MD;  Location: [facility] OR[facility];  Service: General;  Laterality: N/A;  . ENDOMETRIAL ABLATION  2007  . ESOPHAGEAL MANOMETRY N/A 05/21/2016   Procedure: ESOPHAGEAL MANOMETRY (EM);  Surgeon: Lucilla Lame, MD;  Location: [facility] ENDOSCOPY;  Service: Endoscopy;  Laterality: N/A;  . ESOPHAGOGASTRODUODENOSCOPY (EGD) WITH PROPOFOL N/A 05/14/2016   Procedure: ESOPHAGOGASTRODUODENOSCOPY (EGD) WITH PROPOFOL;  Surgeon: Lucilla Lame, MD;  Location: [facility] ENDOSCOPY;  Service: Endoscopy;  Laterality: N/A;  . ESOPHAGOGASTRODUODENOSCOPY (EGD) WITH PROPOFOL N/A 04/10/2017   Procedure: ESOPHAGOGASTRODUODENOSCOPY (EGD) WITH PROPOFOL;  Surgeon: Lucilla Lame,  MD;  Location: [facility];  Service: Endoscopy;  Laterality: N/A;  . INSERTION OF MESH  2014  . LEFT HEART CATHETERIZATION WITH CORONARY ANGIOGRAM N/A 08/30/2013   Procedure: LEFT HEART CATHETERIZATION WITH CORONARY ANGIOGRAM;  Surgeon: Troy Sine, MD;  Location: [facility] CATH LAB;  Service: Cardiovascular;  Laterality: N/A;  . NEPHRECTOMY    . REPLACEMENT  TOTAL KNEE BILATERAL Bilateral 2018   2/18, 5/18 [facility]  . ROUX-EN-Y GASTRIC BYPASS  04/27/2015   [facility]  . TUBAL LIGATION    . UMBILICAL HERNIA REPAIR      Prior to Admission medications   Medication Sig Start Date End Date Taking? Authorizing Provider  albuterol (VENTOLIN HFA) 108 (90 Base) MCG/ACT inhaler Inhale 2 puffs into the lungs every 6 (six) hours as needed for wheezing or shortness of breath. 09/13/16   [provider]  amoxicillin-clavulanate (AUGMENTIN) 875-125 MG tablet Take 1 tablet by mouth. For dental 09/26/16   [provider]  aspirin 81 MG chewable tablet Chew 81 mg by mouth daily.    [provider]  calcium-vitamin D 250-100 MG-UNIT tablet Take 1 tablet by mouth 2 (two) times daily. Reported on 06/16/2015 10/02/15   Leone Haven, MD  carvedilol (COREG) 12.5 MG tablet Take 1 tablet (12.5 mg total) by mouth 2 (two) times daily with a meal. 02/24/17   Gollan, Kathlene November, MD  Cholecalciferol (VITAMIN D-3) 1000 units CAPS Take 1 capsule (1,000 Units total) by mouth daily. Reported on 06/16/2015 10/02/15   Leone Haven, MD  dexlansoprazole (DEXILANT) 60 MG capsule Take 1 capsule (60 mg total) by mouth daily. 09/26/16   Leone Haven, MD  gabapentin (NEURONTIN) 300 MG capsule Take 8 capsules (2,400 mg total) by mouth at bedtime. Patient taking differently: Take 2,400 mg by mouth at bedtime. Takes 600 AM, 1200 PM 09/26/16   Leone Haven, MD  lamoTRIgine (LAMICTAL) 100 MG tablet Take 1 tablet (100 mg total) by mouth daily. 05/08/17   Marcial Pacas, MD  lamoTRIgine (LAMICTAL) 25 MG tablet Take 1 tablet (25 mg total) by mouth daily. 1 tablet twice a day for the first week 2 tablets twice a day for the second week 3 tablets twice a day for the third week 4 tablets twice a day for the fourth week  For total of 140 tablets  After finish titration with small dose of lamotrigine 25 mg, change to lamotrigine 100 mg twice a day 05/08/17   Marcial Pacas, MD   loratadine (CLARITIN) 10 MG tablet Take 1 tablet (10 mg total) by mouth daily. 02/12/16   Leone Haven, MD  losartan (COZAAR) 100 MG tablet TAKE ONE TABLET BY MOUTH ONCE DAILY 02/17/17   Leone Haven, MD  Melatonin 3 MG TABS Take 9 mg by mouth.  09/20/15   [provider]  mirtazapine (REMERON) 15 MG tablet Take 1 tablet (15 mg total) by mouth at bedtime. 01/10/17   Leone Haven, MD  Multiple Vitamins-Minerals (MULTIVITAMIN WITH MINERALS) tablet Take 1 tablet by mouth daily. Reported on 06/16/2015    [provider]  nitroGLYCERIN (NITROSTAT) 0.4 MG SL tablet Place 1 tablet (0.4 mg total) under the tongue every 5 (five) minutes as needed for chest pain. (may repeat every 5 minutes but seek medical help if pain persists after 3 tablets) 04/23/16   Leone Haven, MD  ondansetron (ZOFRAN-ODT) 4 MG disintegrating tablet DISSOLVE 1 TABLET IN MOUTH EVERY 8 HOURS AS NEEDED FOR NAUSEA OR VOMITING  05/15/17   Leone Haven, MD  oxyCODONE (OXY IR/ROXICODONE) 5 MG immediate release tablet Take 1 tablet (5 mg total) by mouth every 6 (six) hours as needed for moderate pain. 05/04/17   Donnie Mesa, MD  promethazine (PHENERGAN) 25 MG tablet TAKE 1 TABLET BY MOUTH EVERY 8 HOURS AS NEEDED FOR NAUSEA OR VOMITING 03/24/17   Leone Haven, MD  QUEtiapine (SEROQUEL) 100 MG tablet TAKE 1 TABLET BY MOUTH AT BEDTIME 02/17/17   Leone Haven, MD  sertraline (ZOLOFT) 100 MG tablet Take 2 tablets (200 mg total) by mouth at bedtime. 02/12/16   Leone Haven, MD  traZODone (DESYREL) 100 MG tablet Take 200 mg by mouth at bedtime.    [provider]  triamcinolone cream (KENALOG) 0.1 % APPLY TOPICALLY TWO TIMES DAILY 02/17/17   Leone Haven, MD  vitamin B-12 (CYANOCOBALAMIN) 500 MCG tablet Take by mouth. 10/02/15   [provider]    Allergies Lisinopril  Family History  Problem Relation Age of Onset  . Hypertension Mother   . Breast cancer  Mother 15  . Arthritis Mother   . Heart disease Paternal Grandmother   . Alcoholism Father   . Hyperlipidemia Unknown        Parent  . Mental illness Unknown        Parent  . Diabetes Unknown        Parent    Social History Social History   Tobacco Use  . Smoking status: Former Smoker    Types: Cigarettes    Last attempt to quit: 1984    Years since quitting: 35.1  . Smokeless tobacco: Never Used  Substance Use Topics  . Alcohol use: No    Frequency: Never  . Drug use: No    Review of Systems Constitutional: No fever/chills Eyes: No visual changes. [name]: No sore throat. Cardiovascular: Denies chest pain. Respiratory: Denies shortness of breath. Gastrointestinal: No abdominal pain.  Positive for nausea and vomiting. Genitourinary: Negative for dysuria. Musculoskeletal: Negative for back pain. Skin: Negative for rash. Neurological: Positive for headache.  ____________________________________________   PHYSICAL EXAM:  VITAL SIGNS: ED Triage Vitals [05/28/17 2205]  Enc Vitals Group     BP 127/89     Pulse Rate 78     Resp 18     Temp 99 F (37.2 C)     Temp Source Oral     SpO2 98 %     Weight 220 lb (99.8 kg)     Height 5\' 5"  (1.651 m)     Head Circumference      Peak Flow      Pain Score 8   Constitutional: Alert and oriented. Well appearing and in no distress. Eyes: Conjunctivae are normal.  [name]   Head: Normocephalic and atraumatic.   Nose: No congestion/rhinnorhea.   Mouth/Throat: Mucous membranes are moist.   Neck: No stridor. Hematological/Lymphatic/Immunilogical: No cervical lymphadenopathy. Cardiovascular: Normal rate, regular rhythm.  No murmurs, rubs, or gallops.  Respiratory: Normal respiratory effort without tachypnea nor retractions. Breath sounds are clear and equal bilaterally. No wheezes/rales/rhonchi. Gastrointestinal: Soft and non tender. No rebound. No guarding.  Genitourinary: Deferred Musculoskeletal: Normal range of  motion in all extremities. No lower extremity edema. Neurologic:  Normal speech and language. No gross focal neurologic deficits are appreciated.  Skin:  Skin is warm, dry and intact. No rash noted. Psychiatric: Mood and affect are normal. Speech and behavior are normal. Patient exhibits appropriate insight and judgment.  ____________________________________________  LABS (pertinent positives/negatives)  Trop <0.03 BMP wnl except ca 8.5 CBC wnl except RDW 15.0  ____________________________________________   EKG  I, Nance Pear, attending physician, personally viewed and interpreted this EKG  EKG Time: 2207 Rate: 81 Rhythm: normal sinus rhythm Axis: normal Intervals: qtc 429 QRS: narrow ST changes: no st elevation, t wave inversion v1-v4 Impression: abnormal ekg  ____________________________________________    RADIOLOGY  CT head No acute disease   ____________________________________________   PROCEDURES  Procedures  ____________________________________________   INITIAL IMPRESSION / ASSESSMENT AND PLAN / ED COURSE  Pertinent labs & imaging results that were available during my care of the patient were reviewed by me and considered in my medical decision making (see chart for details).  Patient presented to the emergency department today because of concerns for bad headache after a syncopal episode yesterday. On exam patient neurologically intact. Denies history of headaches or migraines. CT scan was obtained which did not show any concerning findings. Will plan on treating headache with medication and reassessing.    ____________________________________________   FINAL CLINICAL IMPRESSION(S) / ED DIAGNOSES  Final diagnoses:  Bad headache  Syncope, unspecified syncope type     Note: This dictation was prepared with Dragon dictation. Any transcriptional errors that result from this process are unintentional     Nance Pear, MD 05/29/17  1556

## 2017-05-28 NOTE — ED Triage Notes (Signed)
Pt states she had syncopal episode yesterday and hit back of head on bathtub. States had gallbladder out 2/1 but states has not had any problems. Pt co headache and neck pain. States vomited x 1 yesterday and still feels nauseous.

## 2017-05-28 NOTE — ED Notes (Signed)
Patient transported to CT 

## 2017-05-29 ENCOUNTER — Telehealth: Payer: Self-pay | Admitting: Gastroenterology

## 2017-05-29 ENCOUNTER — Encounter: Payer: Self-pay | Admitting: *Deleted

## 2017-05-29 ENCOUNTER — Telehealth: Payer: Self-pay | Admitting: Neurology

## 2017-05-29 ENCOUNTER — Other Ambulatory Visit: Payer: Self-pay

## 2017-05-29 ENCOUNTER — Other Ambulatory Visit: Payer: Self-pay | Admitting: Family Medicine

## 2017-05-29 MED ORDER — BUTALBITAL-APAP-CAFFEINE 50-325-40 MG PO TABS
1.0000 | ORAL_TABLET | Freq: Once | ORAL | Status: AC
  Administered 2017-05-29: 1 via ORAL
  Filled 2017-05-29: qty 1

## 2017-05-29 MED ORDER — BUTALBITAL-APAP-CAFFEINE 50-325-40 MG PO TABS: 1.0000 | ORAL_TABLET | ORAL | 0 refills | Status: DC | PRN

## 2017-05-29 MED ORDER — PROCHLORPERAZINE MALEATE 10 MG PO TABS: 10.0000 mg | ORAL_TABLET | Freq: Three times a day (TID) | ORAL | 0 refills | Status: DC | PRN

## 2017-05-29 MED ORDER — KETOROLAC TROMETHAMINE 60 MG/2ML IM SOLN
60.0000 mg | Freq: Once | INTRAMUSCULAR | Status: AC
  Administered 2017-05-29: 60 mg via INTRAMUSCULAR
  Filled 2017-05-29: qty 2

## 2017-05-29 NOTE — Telephone Encounter (Signed)
The patient know that she is on a maximum dose of anti- nausea medication and we can refill what she is presently taking but cannot safely go up on this medication. The upper endoscopy did not show any cause for her symptoms without any ulcers or inflammation seen. The surgery appears normal.

## 2017-05-29 NOTE — Telephone Encounter (Addendum)
Spoke to patient about her blackout event.  She has returned to baseline with the exception of a headache.  She was treated in the ED last night and provided a prescription for Fioricet that she has not filled yet.  I instructed her to fill the prescription since Fioricet can be quite helpful in resolving headaches.  She had a normal CT head at the hospital.  She is still titrating up her Lamical dose and is currently taking 75mg  BID for three more days before increasing to 100mg  BID.

## 2017-05-29 NOTE — ED Notes (Signed)

## 2017-05-29 NOTE — Telephone Encounter (Signed)
Pt called stating on 2/26 around 2 she blacked out doesn't remember anything but her sister waking her up by shaking her and asking if she knows who she is. Pt slept for a majority of 2 days, went to the ED were a CT scan was done everything came back clear, but ever since pt has had a massive headache. Pt would like a call back to discuss further and options on what should be done

## 2017-05-29 NOTE — Telephone Encounter (Signed)
Last OV 01/24/17 last filled 03/24/17 30 2rf

## 2017-05-29 NOTE — Telephone Encounter (Signed)
I called pt back regarding her Phenergan 25mg . Advised her to contact Dr. Caryl Bis as he is the one who was prescribing this medication. Pt stated he is wanting [place] to take this over because she is having continuous nausea. Pt is taking 2 tablets every 4 hours along with Zofran and is not having any improvement in her symptoms. Please review chart and advise. EGD done by you on 04/10/17. Pt has had gastric bypass. Pt is wanting an increase in her medication.

## 2017-05-29 NOTE — ED Provider Notes (Signed)
-----------------------------------------   1:31 AM on 05/29/2017 -----------------------------------------  Awaken patient from sleep to assess her pain.  States headache is gone.  Neck is supple.  No focal neurological deficits on exam.  Will discharge home on Fioricet to take as needed.  Strict return precautions given.  Patient verbalizes understanding and agrees with plan of care.   Paulette Blanch, MD 05/29/17 [phone]

## 2017-05-29 NOTE — Discharge Instructions (Signed)
Please seek medical attention for any high fevers, chest pain, shortness of breath, change in behavior, persistent vomiting, bloody stool or any other new or concerning symptoms.  

## 2017-05-29 NOTE — Telephone Encounter (Signed)
[facility] Patient called and wants to know if she can get more than 30 pills at a time. She usually runs out in 15 days. Grayland Ormond

## 2017-05-29 NOTE — ED Notes (Signed)
Verbal order to discontinue NS and compazine due to no IV access after IV team consult, pt verbalizes understanding of this.

## 2017-05-29 NOTE — Telephone Encounter (Signed)
Patient states she did ask GI to fill this and did not mean to have this sent to [place].

## 2017-05-29 NOTE — Telephone Encounter (Signed)
It appears that she has a call in to GI for refills on this. Please check with her to see if they refilled this. Thanks.

## 2017-05-29 NOTE — Telephone Encounter (Signed)
Per vo by Dr. Krista Blue, go ahead and increase Lamictal to 100mg  BID.  Returned call to patient - she verbalized understanding and will start the higher dose.  She will contact our office to report any further events.

## 2017-06-03 ENCOUNTER — Ambulatory Visit
Admission: RE | Admit: 2017-06-03 | Discharge: 2017-06-03 | Disposition: A | Discharge status: Home / Self Care | Payer: PPO | Source: Ambulatory Visit | Attending: Surgery | Admitting: Surgery

## 2017-06-03 ENCOUNTER — Other Ambulatory Visit: Payer: Self-pay | Admitting: Surgery

## 2017-06-03 DIAGNOSIS — R160 Hepatomegaly, not elsewhere classified: Secondary | ICD-10-CM | POA: Diagnosis not present

## 2017-06-03 DIAGNOSIS — K769 Liver disease, unspecified: Secondary | ICD-10-CM

## 2017-06-23 ENCOUNTER — Telehealth: Payer: Self-pay | Admitting: Family Medicine

## 2017-06-23 DIAGNOSIS — I10 Essential (primary) hypertension: Secondary | ICD-10-CM

## 2017-06-23 NOTE — Telephone Encounter (Signed)
Copied from Denise Davis [phone]. Topic: Quick Communication - See Telephone Encounter >> Jun 23, 2017 12:01 PM Conception Chancy, NT wrote: CRM for notification. See Telephone encounter for: 06/23/17.  Patient is calling and states she received a letter in the mail that there was a recall on losartan (COZAAR) 100 MG tablet  And she is needing something else. She states she has been itching lately and would like to know if It is coming from that. Please advise.  [facility] [address] [place]  [place] [address]  Phone: [phone] Fax: [phone]

## 2017-06-23 NOTE — Telephone Encounter (Signed)
Please advise, patient states her pharmacy sent her a letter informing her the medication was part of the recall

## 2017-06-24 ENCOUNTER — Emergency Department: Payer: PPO

## 2017-06-24 ENCOUNTER — Emergency Department
Admission: EM | Admit: 2017-06-24 | Discharge: 2017-06-24 | Disposition: A | Discharge status: Home / Self Care | Payer: PPO | Attending: Emergency Medicine | Admitting: Emergency Medicine

## 2017-06-24 ENCOUNTER — Other Ambulatory Visit: Payer: Self-pay

## 2017-06-24 ENCOUNTER — Encounter: Payer: Self-pay | Admitting: *Deleted

## 2017-06-24 DIAGNOSIS — I10 Essential (primary) hypertension: Secondary | ICD-10-CM | POA: Diagnosis not present

## 2017-06-24 DIAGNOSIS — E119 Type 2 diabetes mellitus without complications: Secondary | ICD-10-CM | POA: Insufficient documentation

## 2017-06-24 DIAGNOSIS — Z85528 Personal history of other malignant neoplasm of kidney: Secondary | ICD-10-CM | POA: Diagnosis not present

## 2017-06-24 DIAGNOSIS — Z79899 Other long term (current) drug therapy: Secondary | ICD-10-CM | POA: Diagnosis not present

## 2017-06-24 DIAGNOSIS — L299 Pruritus, unspecified: Secondary | ICD-10-CM | POA: Diagnosis not present

## 2017-06-24 DIAGNOSIS — Z96653 Presence of artificial knee joint, bilateral: Secondary | ICD-10-CM | POA: Insufficient documentation

## 2017-06-24 DIAGNOSIS — Z905 Acquired absence of kidney: Secondary | ICD-10-CM | POA: Diagnosis not present

## 2017-06-24 DIAGNOSIS — Z955 Presence of coronary angioplasty implant and graft: Secondary | ICD-10-CM | POA: Diagnosis not present

## 2017-06-24 DIAGNOSIS — S90111A Contusion of right great toe without damage to nail, initial encounter: Secondary | ICD-10-CM | POA: Diagnosis not present

## 2017-06-24 LAB — COMPREHENSIVE METABOLIC PANEL
ALT: 19 U/L (ref 14–54)
AST: 20 U/L (ref 15–41)
Albumin: 3.5 g/dL (ref 3.5–5.0)
Alkaline Phosphatase: 81 U/L (ref 38–126)
Anion gap: 8 (ref 5–15)
BILIRUBIN TOTAL: 0.6 mg/dL (ref 0.3–1.2)
BUN: 10 mg/dL (ref 6–20)
CO2: 24 mmol/L (ref 22–32)
CREATININE: 0.81 mg/dL (ref 0.44–1.00)
Calcium: 8.4 mg/dL — ABNORMAL LOW (ref 8.9–10.3)
Chloride: 107 mmol/L (ref 101–111)
GFR calc Af Amer: >60 mL/min (ref >60)
Glucose, Bld: 100 mg/dL — ABNORMAL HIGH (ref 65–99)
POTASSIUM: 3.6 mmol/L (ref 3.5–5.1)
Sodium: 139 mmol/L (ref 135–145)
TOTAL PROTEIN: 6.5 g/dL (ref 6.5–8.1)

## 2017-06-24 LAB — CBC WITH DIFFERENTIAL/PLATELET
BASOS ABS: 0 10*3/uL (ref 0–0.1)
Basophils Relative: 1 %
EOS PCT: 3 %
Eosinophils Absolute: 0.1 10*3/uL (ref 0–0.7)
HCT: 42.1 % (ref 35.0–47.0)
Hemoglobin: 14.1 g/dL (ref 12.0–16.0)
Lymphocytes Relative: 41 %
Lymphs Abs: 2.2 10*3/uL (ref 1.0–3.6)
MCH: 32.1 pg (ref 26.0–34.0)
MCHC: 33.4 g/dL (ref 32.0–36.0)
MCV: 96.2 fL (ref 80.0–100.0)
Monocytes Absolute: 0.4 10*3/uL (ref 0.2–0.9)
Monocytes Relative: 8 %
Neutro Abs: 2.5 10*3/uL (ref 1.4–6.5)
Neutrophils Relative %: 47 %
PLATELETS: 186 10*3/uL (ref 150–440)
RBC: 4.38 MIL/uL (ref 3.80–5.20)
RDW: 14.7 % — ABNORMAL HIGH (ref 11.5–14.5)
WBC: 5.2 10*3/uL (ref 3.6–11.0)

## 2017-06-24 MED ORDER — HYDROXYZINE HCL 25 MG PO TABS
50.0000 mg | ORAL_TABLET | Freq: Once | ORAL | Status: AC
  Administered 2017-06-24: 50 mg via ORAL
  Filled 2017-06-24: qty 2

## 2017-06-24 MED ORDER — HYDROXYZINE PAMOATE 50 MG PO CAPS: 50.0000 mg | ORAL_CAPSULE | Freq: Three times a day (TID) | ORAL | 0 refills | Status: DC | PRN

## 2017-06-24 MED ORDER — DIPHENHYDRAMINE HCL 25 MG PO CAPS
ORAL_CAPSULE | ORAL | Status: AC
  Filled 2017-06-24: qty 2

## 2017-06-24 MED ORDER — TELMISARTAN 40 MG PO TABS: 40.0000 mg | ORAL_TABLET | Freq: Every day | ORAL | 1 refills | Status: DC

## 2017-06-24 MED ORDER — DIPHENHYDRAMINE HCL 25 MG PO CAPS
50.0000 mg | ORAL_CAPSULE | Freq: Once | ORAL | Status: AC
  Administered 2017-06-24: 50 mg via ORAL

## 2017-06-24 NOTE — ED Provider Notes (Signed)
[facility] Emergency Department Provider Note  ____________________________________________   First MD Initiated Contact with Patient 06/24/17 [phone]     (approximate)  I have reviewed the triage vital signs and the nursing notes.   HISTORY  Chief Complaint Pruritis   HPI Denise Davis is a 52 y.o. female who self presents to the emergency department with gradual onset slowly progressive moderate to severe itching on bilateral thighs and across her lower abdomen for the past 24 hours or so.  Her symptoms are now constant.  They seem to be somewhat worse at night.  No fevers or chills.  No oral involvement or ocular involvement.  No new medications.  No new exposures.  No chest pain shortness of breath abdominal pain nausea or vomiting.  She is tried no medications and nothing seems to work.  Past Medical History:  Diagnosis Date  . Allergic rhinitis   . Anxiety   . Depression   . Diabetes mellitus    taken off meds in beginning fo 2018 dueto weight loss   . DVT of leg (deep venous thrombosis) ([place]) 12/2007   left leg, given warfarin for 6 mos  . GERD (gastroesophageal reflux disease)   . H/O hiatal hernia   . Hyperlipidemia   . Hypertension   . Morbid obesity ([place])   . Pericarditis    Diagnosed/treated at [facility]; per patient 4 episodes per cardiologist Dr. Edwin Dada persistant chest pain unclear if pericarditis verusus neuropsychogenic   . Renal cell cancer ([place]) 2009   S/p nephrectomy (left)  . Seizure disorder ([place])    (04/08/17)- None in over 1 yr, no meds.    Patient Active Problem List   Diagnosis Date Noted  . Seizure-like activity ([place]) 05/08/2017  . S/P laparoscopic cholecystectomy 05/02/2017  . BPPV (benign paroxysmal positional vertigo), right 01/24/2017  . SOB (shortness of breath) 12/18/2016  . Esophageal spasm 05/30/2016  . Nausea   . Bariatric surgery status   . Other specified diseases of esophagus   . Neuropathy 02/12/2016  .  Decreased pedal pulses 02/12/2016  . Allergic rhinitis 02/12/2016  . Hypokalemia 12/08/2015  . Sleep apnea 12/01/2015  . Palpitations 12/01/2015  . Status post gastric bypass for obesity 10/03/2015  . Osteoarthritis 10/03/2015  . Anxiety and depression 02/13/2015  . Seizure disorder ([place]) 02/13/2015  . GERD (gastroesophageal reflux disease) 02/13/2015  . Right sided weakness 08/25/2013  . Vocal cord dysfunction 08/18/2012  . Chest pain 05/09/2011  . Diabetes mellitus ([place])   . Hypertension     Past Surgical History:  Procedure Laterality Date  . CHOLECYSTECTOMY N/A 05/02/2017   Procedure: LAPAROSCOPIC CHOLECYSTECTOMY WITH INTRAOPERATIVE CHOLANGIOGRAM;  Surgeon: Johnathan Hausen, MD;  Location: [facility];  Service: General;  Laterality: N/A;  . ENDOMETRIAL ABLATION  2007  . ESOPHAGEAL MANOMETRY N/A 05/21/2016   Procedure: ESOPHAGEAL MANOMETRY (EM);  Surgeon: Lucilla Lame, MD;  Location: [facility] ENDOSCOPY;  Service: Endoscopy;  Laterality: N/A;  . ESOPHAGOGASTRODUODENOSCOPY (EGD) WITH PROPOFOL N/A 05/14/2016   Procedure: ESOPHAGOGASTRODUODENOSCOPY (EGD) WITH PROPOFOL;  Surgeon: Lucilla Lame, MD;  Location: [facility] ENDOSCOPY;  Service: Endoscopy;  Laterality: N/A;  . ESOPHAGOGASTRODUODENOSCOPY (EGD) WITH PROPOFOL N/A 04/10/2017   Procedure: ESOPHAGOGASTRODUODENOSCOPY (EGD) WITH PROPOFOL;  Surgeon: Lucilla Lame, MD;  Location: [place];  Service: Endoscopy;  Laterality: N/A;  . INSERTION OF MESH  2014  . LEFT HEART CATHETERIZATION WITH CORONARY ANGIOGRAM N/A 08/30/2013   Procedure: LEFT HEART CATHETERIZATION WITH CORONARY ANGIOGRAM;  Surgeon: Troy Sine, MD;  Location:  [facility] CATH [facility];  Service: Cardiovascular;  Laterality: N/A;  . NEPHRECTOMY    . REPLACEMENT TOTAL KNEE BILATERAL Bilateral 2018   2/18, 5/18 [facility]  . ROUX-EN-Y GASTRIC BYPASS  04/27/2015   [facility]  . TUBAL LIGATION    . UMBILICAL HERNIA REPAIR      Prior to Admission medications   Medication Sig Start Date End Date Taking?  Authorizing Provider  albuterol (VENTOLIN HFA) 108 (90 Base) MCG/ACT inhaler Inhale 2 puffs into the lungs every 6 (six) hours as needed for wheezing or shortness of breath. 09/13/16   [provider]  amoxicillin-clavulanate (AUGMENTIN) 875-125 MG tablet Take 1 tablet by mouth. For dental 09/26/16   [provider]  aspirin 81 MG chewable tablet Chew 81 mg by mouth daily.    [provider]  butalbital-acetaminophen-caffeine (FIORICET, ESGIC) [phone] MG tablet Take 1 tablet by mouth every 4 (four) hours as needed for headache. 05/29/17   Paulette Blanch, MD  calcium-vitamin D 250-100 MG-UNIT tablet Take 1 tablet by mouth 2 (two) times daily. Reported on 06/16/2015 10/02/15   Leone Haven, MD  carvedilol (COREG) 12.5 MG tablet Take 1 tablet (12.5 mg total) by mouth 2 (two) times daily with a meal. 02/24/17   Gollan, Kathlene November, MD  Cholecalciferol (VITAMIN D-3) 1000 units CAPS Take 1 capsule (1,000 Units total) by mouth daily. Reported on 06/16/2015 10/02/15   Leone Haven, MD  dexlansoprazole (DEXILANT) 60 MG capsule Take 1 capsule (60 mg total) by mouth daily. 09/26/16   Leone Haven, MD  gabapentin (NEURONTIN) 300 MG capsule Take 8 capsules (2,400 mg total) by mouth at bedtime. Patient taking differently: Take 2,400 mg by mouth at bedtime. Takes 600 AM, 1200 PM 09/26/16   Leone Haven, MD  hydrOXYzine (VISTARIL) 50 MG capsule Take 1 capsule (50 mg total) by mouth 3 (three) times daily as needed for itching. 06/24/17   Darel Hong, MD  lamoTRIgine (LAMICTAL) 100 MG tablet Take 1 tablet (100 mg total) by mouth daily. 05/08/17   Marcial Pacas, MD  loratadine (CLARITIN) 10 MG tablet Take 1 tablet (10 mg total) by mouth daily. 02/12/16   Leone Haven, MD  losartan (COZAAR) 100 MG tablet TAKE ONE TABLET BY MOUTH ONCE DAILY 02/17/17   Leone Haven, MD  Melatonin 3 MG TABS Take 9 mg by mouth.  09/20/15   [provider]  mirtazapine (REMERON) 15 MG  tablet Take 1 tablet (15 mg total) by mouth at bedtime. 01/10/17   Leone Haven, MD  Multiple Vitamins-Minerals (MULTIVITAMIN WITH MINERALS) tablet Take 1 tablet by mouth daily. Reported on 06/16/2015    [provider]  nitroGLYCERIN (NITROSTAT) 0.4 MG SL tablet Place 1 tablet (0.4 mg total) under the tongue every 5 (five) minutes as needed for chest pain. (may repeat every 5 minutes but seek medical help if pain persists after 3 tablets) 04/23/16   Leone Haven, MD  ondansetron (ZOFRAN-ODT) 4 MG disintegrating tablet DISSOLVE 1 TABLET IN MOUTH EVERY 8 HOURS AS NEEDED FOR NAUSEA OR VOMITING 05/15/17   Leone Haven, MD  oxyCODONE (OXY IR/ROXICODONE) 5 MG immediate release tablet Take 1 tablet (5 mg total) by mouth every 6 (six) hours as needed for moderate pain. 05/04/17   Donnie Mesa, MD  promethazine (PHENERGAN) 25 MG tablet TAKE 1 TABLET BY MOUTH EVERY 8 HOURS AS NEEDED FOR NAUSEA OR VOMITING 03/24/17   Leone Haven, MD  QUEtiapine (SEROQUEL) 100 MG tablet TAKE  1 TABLET BY MOUTH AT BEDTIME 02/17/17   Leone Haven, MD  sertraline (ZOLOFT) 100 MG tablet Take 2 tablets (200 mg total) by mouth at bedtime. 02/12/16   Leone Haven, MD  telmisartan (MICARDIS) 40 MG tablet Take 1 tablet (40 mg total) by mouth daily. 06/24/17   Leone Haven, MD  traZODone (DESYREL) 100 MG tablet Take 200 mg by mouth at bedtime.    [provider]  triamcinolone cream (KENALOG) 0.1 % APPLY TOPICALLY TWO TIMES DAILY 02/17/17   Leone Haven, MD  vitamin B-12 (CYANOCOBALAMIN) 500 MCG tablet Take by mouth. 10/02/15   [provider]    Allergies Lisinopril  Family History  Problem Relation Age of Onset  . Hypertension Mother   . Breast cancer Mother 66  . Arthritis Mother   . Heart disease Paternal Grandmother   . Alcoholism Father   . Hyperlipidemia Unknown        Parent  . Mental illness Unknown        Parent  . Diabetes Unknown        Parent      Social History Social History   Tobacco Use  . Smoking status: Former Smoker    Types: Cigarettes    Last attempt to quit: 1984    Years since quitting: 35.2  . Smokeless tobacco: Never Used  Substance Use Topics  . Alcohol use: No    Frequency: Never  . Drug use: No    Review of Systems Constitutional: No fever/chills [name]: No sore throat. Cardiovascular: Denies chest pain. Respiratory: Denies shortness of breath. Gastrointestinal: No abdominal pain.  No nausea, no vomiting.  No diarrhea.  No constipation. Musculoskeletal: Negative for back pain. Neurological: Negative for headaches   ____________________________________________   PHYSICAL EXAM:  VITAL SIGNS: ED Triage Vitals  Enc Vitals Group     BP 06/24/17 0024 (!) 148/86     Pulse Rate 06/24/17 0024 95     Resp 06/24/17 0024 16     Temp 06/24/17 0024 98.7 F (37.1 C)     Temp Source 06/24/17 0024 Oral     SpO2 06/24/17 0024 98 %     Weight 06/24/17 0024 215 lb (97.5 kg)     Height 06/24/17 0024 5\' 5"  (1.651 m)     Head Circumference --      Peak Flow --      Pain Score 06/24/17 0023 0     Pain Loc --      Pain Edu? --      Excl. in [place]? --     Constitutional: Alert and oriented x4 appears uncomfortable actively scratching during exam Head: Atraumatic. Nose: No congestion/rhinnorhea. Mouth/Throat: No trismus no mucosal involvement Neck: No stridor.   Cardiovascular: Regular rate and rhythm Respiratory: Normal respiratory effort.  No retractions. Neurologic:  Normal speech and language. No gross focal neurologic deficits are appreciated.  Skin: Faint erythema with excoriations across lower abdomen and inner thighs.  She does have faint papules on bilateral palms    ____________________________________________  LABS (all labs ordered are listed, but only abnormal results are displayed)  Labs Reviewed  COMPREHENSIVE METABOLIC PANEL - Abnormal; Notable for the following components:      Result  Value   Glucose, Bld 100 (*)    Calcium 8.4 (*)    All other components within normal limits  CBC WITH DIFFERENTIAL/PLATELET - Abnormal; Notable for the following components:   RDW 14.7 (*)    All  other components within normal limits  RPR    Lab work reviewed by me with no acute disease __________________________________________  EKG   ____________________________________________  RADIOLOGY   ____________________________________________   DIFFERENTIAL includes but not limited to  Bug bites, [facility] spotted fever, hand-foot-and-mouth disease, syphilis   PROCEDURES  Procedure(s) performed: no  Procedures  Critical Care performed: no  Observation: no ____________________________________________   INITIAL IMPRESSION / ASSESSMENT AND PLAN / ED COURSE  Pertinent labs & imaging results that were available during my care of the patient were reviewed by me and considered in my medical decision making (see chart for details).  The patient arrives uncomfortable appearing with excoriations across her thighs.  She says that the papules on her hands are probably old although she is not entirely sure.  She has had no tick exposure.  No other suggestion of hand-foot-and-mouth disease.  Given the diffuse erythema and itching I think is reasonable to check labs for bilirubin as well as an RPR for syphilis.  Fortunately the patient's blood work is reassuring.  She feels improved after Atarax.  I will discharge her home on a short course of primary care follow-up.      ____________________________________________   FINAL CLINICAL IMPRESSION(S) / ED DIAGNOSES  Final diagnoses:  Itching      NEW MEDICATIONS STARTED DURING THIS VISIT:  Discharge Medication List as of 06/24/2017  4:19 AM    START taking these medications   Details  hydrOXYzine (VISTARIL) 50 MG capsule Take 1 capsule (50 mg total) by mouth 3 (three) times daily as needed for itching., Starting Tue  06/24/2017, Print         Note:  This document was prepared using Dragon voice recognition software and may include unintentional dictation errors.      Darel Hong, MD 06/26/17 2221

## 2017-06-24 NOTE — ED Triage Notes (Signed)
Pt reports itching on her thighs that started yesterday and she woke up tonight itching all over. Reports rash to thighs, not visible in triage. No new products/foods/meds that she is aware of. Has not taken any medications PTA.

## 2017-06-24 NOTE — ED Triage Notes (Signed)
Pt ambulatory with steady gait back to triage room with additional complaints. c/o that stubbed/scratched her right foot second toe on a bed post and the toe has been painful and red.

## 2017-06-24 NOTE — Discharge Instructions (Signed)
Please use your itching medication as needed for severe symptoms and make an appointment to follow-up with your primary care physician within 3 days for reevaluation.  Return to the emergency department sooner for any new or worsening symptoms such as fevers, chills, worsening symptoms, or for any other issues whatsoever.  It was a pleasure to take care of you today, and thank you for coming to our emergency department.  If you have any questions or concerns before leaving please ask the nurse to grab me and I'm more than happy to go through your aftercare instructions again.  If you were prescribed any opioid pain medication today such as Norco, Vicodin, Percocet, morphine, hydrocodone, or oxycodone please make sure you do not drive when you are taking this medication as it can alter your ability to drive safely.  If you have any concerns once you are home that you are not improving or are in fact getting worse before you can make it to your follow-up appointment, please do not hesitate to call 911 and come back for further evaluation.  Darel Hong, MD  Results for orders placed or performed during the hospital encounter of 06/24/17  Comprehensive metabolic panel  Result Value Ref Range   Sodium 139 135 - 145 mmol/L   Potassium 3.6 3.5 - 5.1 mmol/L   Chloride 107 101 - 111 mmol/L   CO2 24 22 - 32 mmol/L   Glucose, Bld 100 (H) 65 - 99 mg/dL   BUN 10 6 - 20 mg/dL   Creatinine, Ser 0.81 0.44 - 1.00 mg/dL   Calcium 8.4 (L) 8.9 - 10.3 mg/dL   Total Protein 6.5 6.5 - 8.1 g/dL   Albumin 3.5 3.5 - 5.0 g/dL   AST 20 15 - 41 U/L   ALT 19 14 - 54 U/L   Alkaline Phosphatase 81 38 - 126 U/L   Total Bilirubin 0.6 0.3 - 1.2 mg/dL   GFR calc non Af Amer >60 >60 mL/min   GFR calc Af Amer >60 >60 mL/min   Anion gap 8 5 - 15  CBC with Differential  Result Value Ref Range   WBC 5.2 3.6 - 11.0 K/uL   RBC 4.38 3.80 - 5.20 MIL/uL   Hemoglobin 14.1 12.0 - 16.0 g/dL   HCT 42.1 35.0 - 47.0 %   MCV 96.2  80.0 - 100.0 fL   MCH 32.1 26.0 - 34.0 pg   MCHC 33.4 32.0 - 36.0 g/dL   RDW 14.7 (H) 11.5 - 14.5 %   Platelets 186 150 - 440 K/uL   Neutrophils Relative % 47 %   Neutro Abs 2.5 1.4 - 6.5 K/uL   Lymphocytes Relative 41 %   Lymphs Abs 2.2 1.0 - 3.6 K/uL   Monocytes Relative 8 %   Monocytes Absolute 0.4 0.2 - 0.9 K/uL   Eosinophils Relative 3 %   Eosinophils Absolute 0.1 0 - 0.7 K/uL   Basophils Relative 1 %   Basophils Absolute 0.0 0 - 0.1 K/uL   Ct Head Wo Contrast  Result Date: 05/28/2017 CLINICAL DATA:  Syncope EXAM: CT HEAD WITHOUT CONTRAST TECHNIQUE: Contiguous axial images were obtained from the base of the skull through the vertex without intravenous contrast. COMPARISON:  MRI 05/14/2017.  CT 06/02/2014. FINDINGS: Brain: No acute intracranial abnormality. Specifically, no hemorrhage, hydrocephalus, mass lesion, acute infarction, or significant intracranial injury. Vascular: No hyperdense vessel or unexpected calcification. Skull: No acute calvarial abnormality. Sinuses/Orbits: Visualized paranasal sinuses and mastoids clear. Orbital soft tissues unremarkable.  Other: None IMPRESSION: Normal study. Electronically Signed   By: Rolm Baptise M.D.   On: 05/28/2017 23:16   Mr Abdomen Wo Contrast  Result Date: 06/03/2017 CLINICAL DATA:  Hepatomegaly with liver lesion on CT EXAM: MRI ABDOMEN WITHOUT CONTRAST TECHNIQUE: Multiplanar multisequence MR imaging was performed without the administration of intravenous contrast. COMPARISON:  CT abdomen/pelvis dated 05/16/2017 FINDINGS: Due to lack of IV access, study was performed without contrast. Lower chest: Lung bases are clear. Hepatobiliary: Riedel's lobe configuration of the liver with elongated right hepatic lobe. 9 mm subcapsular lesion in segment 8 (series 4/image 111), with faint T2 hyperintensity (series 3/image 20), without intrinsic T1 hyperintensity. Given the lack of contrast administration, this appearance remains nonspecific, although a  benign hemangioma remains possible. The appearance does not suggest a benign simple or proteinaceous cyst. Status post cholecystectomy. No intrahepatic or extrahepatic ductal dilatation. Pancreas:  Within normal limits. Spleen:  Within normal limits. Adrenals/Urinary Tract:  Adrenal glands are within normal limits. Right kidney is within normal limits.  No hydronephrosis. Left kidney is surgically absent. Stomach/Bowel: Stomach is notable for postsurgical changes related to gastric bypass. Visualized bowel is otherwise grossly unremarkable. Vascular/Lymphatic:  No evidence of abdominal aortic aneurysm. No suspicious abdominal lymphadenopathy. Other:  No abdominal ascites. Musculoskeletal: No focal osseous lesions. IMPRESSION: 9 mm subcapsular lesion in segment 8 could reflect a benign hemangioma but remains incompletely evaluated given the inability to administer intravenous contrast. Given low suspicion and small size, follow-up in 6 months could be considered for definitive characterization (or at least to demonstrate stability) if clinically warranted. Electronically Signed   By: Julian Hy M.D.   On: 06/03/2017 16:22   Dg Toe 2nd Right  Result Date: 06/24/2017 CLINICAL DATA:  Stubbed toe EXAM: RIGHT SECOND TOE COMPARISON:  None. FINDINGS: There is no evidence of fracture or dislocation. There is no evidence of arthropathy or other focal bone abnormality. Soft tissues are unremarkable. IMPRESSION: Negative. Electronically Signed   By: Ulyses Jarred M.D.   On: 06/24/2017 01:42

## 2017-06-24 NOTE — Telephone Encounter (Signed)
Telmisartan sent to pharmacy.  She will need a BMP in 7-10 days after starting this as well as a BP check at that time.  I have placed the order for the labs.  Please get her scheduled.  Thanks.

## 2017-06-25 LAB — RPR: RPR: NONREACTIVE

## 2017-06-25 NOTE — Telephone Encounter (Signed)
Tried calling, no voicemail. [place] for pec to speak to patient and schedule nurse and lab appointment

## 2017-06-25 NOTE — Telephone Encounter (Signed)
Patient notified and scheduled by pec

## 2017-06-27 ENCOUNTER — Telehealth: Payer: Self-pay | Admitting: Cardiovascular Disease

## 2017-06-27 ENCOUNTER — Telehealth: Payer: Self-pay | Admitting: *Deleted

## 2017-06-27 ENCOUNTER — Ambulatory Visit (INDEPENDENT_AMBULATORY_CARE_PROVIDER_SITE_OTHER): Payer: PPO | Admitting: Family Medicine

## 2017-06-27 ENCOUNTER — Encounter: Payer: Self-pay | Admitting: Family Medicine

## 2017-06-27 VITALS — BP 120/80 | HR 73 | Temp 98.2°F | Wt 220.2 lb

## 2017-06-27 DIAGNOSIS — G40909 Epilepsy, unspecified, not intractable, without status epilepticus: Secondary | ICD-10-CM | POA: Diagnosis not present

## 2017-06-27 DIAGNOSIS — L299 Pruritus, unspecified: Secondary | ICD-10-CM | POA: Diagnosis not present

## 2017-06-27 DIAGNOSIS — R519 Headache, unspecified: Secondary | ICD-10-CM

## 2017-06-27 DIAGNOSIS — R51 Headache: Secondary | ICD-10-CM

## 2017-06-27 DIAGNOSIS — R55 Syncope and collapse: Secondary | ICD-10-CM | POA: Diagnosis not present

## 2017-06-27 DIAGNOSIS — T148XXA Other injury of unspecified body region, initial encounter: Secondary | ICD-10-CM

## 2017-06-27 DIAGNOSIS — G629 Polyneuropathy, unspecified: Secondary | ICD-10-CM

## 2017-06-27 DIAGNOSIS — M79674 Pain in right toe(s): Secondary | ICD-10-CM | POA: Diagnosis not present

## 2017-06-27 MED ORDER — LORATADINE 10 MG PO TABS: 10.0000 mg | ORAL_TABLET | Freq: Every day | ORAL | 3 refills | Status: AC

## 2017-06-27 MED ORDER — GABAPENTIN 300 MG PO CAPS: ORAL_CAPSULE | ORAL | 3 refills | Status: DC

## 2017-06-27 NOTE — Progress Notes (Signed)
Tommi Rumps, MD Phone: [phone]  Denise Davis is a 52 y.o. female who presents today for ED follow-up.  Was seen in the emergency department due to itching.  She had whole body itching one night and went to be evaluated.  It was severe itching.  Notes they treated her with Benadryl and then Atarax.  Now her only itching is on her thighs.  It was red and inflamed but that has improved.  Now it is bruised in that area due to scratching.  No new soaps or detergents.  She has been on Lamictal though has no vaginal, eye, or oropharyngeal rash or irritation.  She has been taking Atarax with good benefit.  She reports she scratched and stubbed her right second toe and since then has had some slight swelling.  She did have an x-ray in the ED that was negative.  There is no erythema or warmth or drainage.  She reports she has been on gabapentin for at least a decade.  She takes 600 mg in the morning and 1200 mg at night.  This is not for her possible seizures.  She is not sure why she is taking it though she does continue to take it.  She was evaluated in the emergency department on another occasion for an event where she passed out.  She was sitting on the edge of the bathtub and then the next thing she knew she was in the bathtub and her sister was standing over her asking her if she knew who she was.  She had no preceding symptoms.  No chest pain, shortness of breath, palpitations, or seizure activity.  She was sleepy afterwards.  She did hit her head posteriorly after she passed out and has had some muscular soreness in the back of her head and some mild headaches.  She did have a CT scan when she was evaluated in the ED that was unremarkable.  She notes no numbness, weakness, or vision changes.  Notes the muscles in her neck feels sore.  Social History   Tobacco Use  Smoking Status Former Smoker  . Types: Cigarettes  . Last attempt to quit: 1984  . Years since quitting: 35.2  Smokeless  Tobacco Never Used     ROS see history of present illness  Objective  Physical Exam Vitals:   06/27/17 1122  BP: 120/80  Pulse: 73  Temp: 98.2 F (36.8 C)  SpO2: 99%    BP Readings from Last 3 Encounters:  06/27/17 120/80  06/24/17 140/88  05/29/17 111/76   Wt Readings from Last 3 Encounters:  06/27/17 220 lb 3.2 oz (99.9 kg)  06/24/17 215 lb (97.5 kg)  05/28/17 220 lb (99.8 kg)    Physical Exam  Constitutional: No distress.  Cardiovascular: Normal rate, regular rhythm and normal heart sounds.  Pulmonary/Chest: Effort normal and breath sounds normal.  Musculoskeletal: She exhibits no edema.  No midline neck tenderness, no midline neck step-off, no palpable bony defects or tenderness over her skull, there is slight paraspinous muscular tenderness in the right side of her neck close to the insertion site at her skull, no overlying skin changes  Neurological: She is alert. Gait normal.  CN 2-12 intact, 5/5 strength in bilateral biceps, triceps, grip, quads, hamstrings, plantar and dorsiflexion, sensation to light touch intact in bilateral UE and LE, normal finger to nose, normal rapid alternating movements  Skin: Skin is warm and dry. She is not diaphoretic.  Bruises over her bilateral lateral thighs  with no apparent rash  Right second toe with scab and slight swelling just proximal to the nailbed, no warmth, tenderness, or erythema or drainage   Assessment/Plan: Please see individual problem list.  Itching Undetermined cause.  No apparent rash today.  We will send a message to our clinical pharmacist to see if she can review the patient's chart to see if there are any medications that could be causing itching.  Continue Atarax as needed.  Check CBC and hepatic function panel again to ensure stability.  Seizure disorder ([place]) Continues to have some question of whether or not she has an actual seizure disorder.  She underwent an EEG that appears to have been unremarkable.   They are treating her as though she has had seizures and she is currently on Lamictal.  Her recent episode where she had a syncopal event is not consistent with seizure activity.  Somewhat worrisome for potential cardiac cause and thus we will refer her to cardiology.  We will additionally send a message to her neurologist to get their input on this episode.  She is given return precautions.  I did discuss that she should not be driving given her recent syncopal episode as it could be seizure related.  Pain of toe of right foot Suspect soft tissue injury.  X-ray appears to have been benign.  No signs of infection.  Given persistence will refer to podiatry.  Neuropathy [facility]) Patient is not quite sure why she is on the gabapentin though it may be due to her neuropathy.  She will discussed this with her neurologist at her next visit to determine if she can come off of this.  Discussed that she should not discontinue this medication suddenly as it would place her at risk for seizure activity.  Refill given.  Headache Intermittent mild headaches since having a syncopal episode and hitting her head.  Evaluation with CT scan reassuring.  She does have some muscular soreness in her neck and I suspect these are tension related due to muscular tension following her injury.  She is neurologically intact.  She will monitor at this time.  Consider further evaluation through neurology if these persist.   Orders Placed This Encounter  Procedures  . Hepatic function panel    Standing Status:   Future    Standing Expiration Date:   06/28/2018  . CBC    Standing Status:   Future    Standing Expiration Date:   06/28/2018  . Ambulatory referral to Cardiology    Referral Priority:   Routine    Referral Type:   Consultation    Referral Reason:   Specialty Services Required    Requested Specialty:   Cardiology    Number of Visits Requested:   1  . Ambulatory referral to Podiatry    Referral Priority:   Routine     Referral Type:   Consultation    Referral Reason:   Specialty Services Required    Requested Specialty:   Podiatry    Number of Visits Requested:   1    Meds ordered this encounter  Medications  . loratadine (CLARITIN) 10 MG tablet    Sig: Take 1 tablet (10 mg total) by mouth daily.    Dispense:  90 tablet    Refill:  3  . gabapentin (NEURONTIN) 300 MG capsule    Sig: Take 600 mg (2 tablets) by mouth daily in the morning and take 1200 mg (4 tablets) by mouth daily at night  Dispense:  240 capsule    Refill:  [place], MD [place]

## 2017-06-27 NOTE — Patient Instructions (Signed)
Nice to see you. We will get some lab work. We will have our pharmacist review your medications to look for any that cause itching. We will get you to see cardiology and podiatry. Please do not drive given your prior syncopal episode.  I will send a message to your neurologist regarding this.

## 2017-06-27 NOTE — Telephone Encounter (Signed)
Pt states that she has an appt next week & will hydrate & get labs at that time. I attempted to collect blood from her twice & each time the blood clotted before reaching the tube. If pt needs to have labs prior to next appt, please let me know. Labs have been reordered.  Thanks

## 2017-06-27 NOTE — Assessment & Plan Note (Signed)
Undetermined cause.  No apparent rash today.  We will send a message to our clinical pharmacist to see if she can review the patient's chart to see if there are any medications that could be causing itching.  Continue Atarax as needed.  Check CBC and hepatic function panel again to ensure stability.

## 2017-06-27 NOTE — Telephone Encounter (Signed)
Received referral from Dr. Caryl Bis for syncope patient declined sooner appt   Scheduled 4/10 at 3 with [place] est patient

## 2017-06-27 NOTE — Assessment & Plan Note (Signed)
Intermittent mild headaches since having a syncopal episode and hitting her head.  Evaluation with CT scan reassuring.  She does have some muscular soreness in her neck and I suspect these are tension related due to muscular tension following her injury.  She is neurologically intact.  She will monitor at this time.  Consider further evaluation through neurology if these persist.

## 2017-06-27 NOTE — Assessment & Plan Note (Addendum)
Patient is not quite sure why she is on the gabapentin though it may be due to her neuropathy.  She will discussed this with her neurologist at her next visit to determine if she can come off of this.  Discussed that she should not discontinue this medication suddenly as it would place her at risk for seizure activity.  Refill given.

## 2017-06-27 NOTE — Assessment & Plan Note (Signed)
Suspect soft tissue injury.  X-ray appears to have been benign.  No signs of infection.  Given persistence will refer to podiatry.

## 2017-06-27 NOTE — Assessment & Plan Note (Signed)
Continues to have some question of whether or not she has an actual seizure disorder.  She underwent an EEG that appears to have been unremarkable.  They are treating her as though she has had seizures and she is currently on Lamictal.  Her recent episode where she had a syncopal event is not consistent with seizure activity.  Somewhat worrisome for potential cardiac cause and thus we will refer her to cardiology.  We will additionally send a message to her neurologist to get their input on this episode.  She is given return precautions.  I did discuss that she should not be driving given her recent syncopal episode as it could be seizure related.

## 2017-06-28 NOTE — Telephone Encounter (Signed)
It is ok to draw labs next week. Please followup with her on Monday to make sure she keeps her appointment. Thanks.

## 2017-06-30 ENCOUNTER — Telehealth: Payer: Self-pay | Admitting: Neurology

## 2017-06-30 NOTE — Telephone Encounter (Signed)
Noted  

## 2017-06-30 NOTE — Telephone Encounter (Signed)
Leone Haven, MD  Marcial Pacas, MD        Hi Dr Krista Blue,   I saw Mrs Narvaiz today for follow-up. I wanted to update you on her recent history. She was recently in the ED following a syncopal episode where she was sitting on the edge of the bathtub and then suddenly passed out. There was no noted seizure activity. She hit her head and was sleepy afterwards. Subsequent evaluation in the ED revealed a reassuring CT head. They ended up discharging her home. I know you have been following her for possible seizure activity and I wanted to make you aware. She also noted she had not been evaluated by cardiology previously for these episodes so I am referring her to cardiology.   Tommi Rumps    Follow up visit on May 9th 2019

## 2017-07-03 ENCOUNTER — Telehealth: Payer: Self-pay | Admitting: Family Medicine

## 2017-07-03 NOTE — Telephone Encounter (Signed)
-----   Message from Denise Davis, [facility] sent at 06/30/2017 12:27 PM EDT ----- Fiorecet can cause erythema multiforme, Generalized exanthematous pustulosis, Stevens-Johnson syndrome, Toxic epidermal necrolysis dexlansoprazole can cause cutaneous lupus erythematosus lamictal can cause: erythema multiforme (less than 0.1% ), rash, Serious (0.08% to 0.8% ), Stevens-Johnson syndrome, Toxic epidermal necrolysis  Nothing is just itching but those are my only thoughts.   Chrys Racer  ----- Message ----- From: Leone Haven, MD Sent: 06/27/2017   7:12 PM To: Denise Davis, [place],   This patient had some issues with diffuse itching that required an ED visit. That has since resolved, though she has had focal itching over her thighs bilaterally. There is no apparent rash. I wanted to see if you could look at her meds and let me know if any of them strike you as possible culprits. Thanks.   Randall Hiss

## 2017-07-03 NOTE — Telephone Encounter (Signed)
Please let the patient know that I heard back from the pharmacist and she reviewed her medications. None of them appear to cause just itching. Some of them can cause rashes. Please see how she is doing with regards to her itching. Thanks.

## 2017-07-04 NOTE — Telephone Encounter (Signed)
Patient notified and states she has been taking the hydroxyzine non stop so she is not sure if she still has the itching. Per Dr.Sonnenberg I have informed patient to try to stop the medication and if she continues the itching then we will refer her to dermatology. Patient verbalized understanding and will call back if she continues with itching.

## 2017-07-05 NOTE — Progress Notes (Deleted)
Cardiology Office Note  Date:  07/05/2017   ID:  Denise Davis, DOB April 01, 1966, MRN [id number]  PCP:  Leone Haven, MD   No chief complaint on file.   HPI:  52 year old woman with past medical history of chronic intermittent nausea since having gastric bypass surgery anxiety and depression grief of losing her husband intermittent vertigo symptoms  black outs, Questionable seizure disorder. Former smoker Who presents by referral from Dr. Caryl Bis for shortness of breath  She reports long history of chest pain and shortness of breath Typically wakes up early as  is her habit Shortly after she wakes up she has shortness of breath, left-sided chest pain, dizziness with chronic nausea  has stayed on the same medications despite 170 pound weight loss  BP low at home 98/70 by her measurements  Was previously weight 400 , lost down to 233   Previous studies were reviewed with her in detail Echocardiogram September 2017 Essentially normal study  Cardiac cath May 2015 Minimal nonobstructive coronary disease Performed by Dr. Claiborne Billings  EKG personally reviewed by myself on todays visit Shows normal sinus rhythm rate 81 bpm nonspecific T wave abnormality V1 through V3, seen previously  PMH:   has a past medical history of Allergic rhinitis, Anxiety, Depression, Diabetes mellitus, DVT of leg (deep venous thrombosis) ([place]) (12/2007), GERD (gastroesophageal reflux disease), H/O hiatal hernia, Hyperlipidemia, Hypertension, Morbid obesity ([place]), Pericarditis, Renal cell cancer ([place]) (2009), and Seizure disorder ([place]).  PSH:    Past Surgical History:  Procedure Laterality Date  . CHOLECYSTECTOMY N/A 05/02/2017   Procedure: LAPAROSCOPIC CHOLECYSTECTOMY WITH INTRAOPERATIVE CHOLANGIOGRAM;  Surgeon: Johnathan Hausen, MD;  Location: [facility] ORS;  Service: General;  Laterality: N/A;  . ENDOMETRIAL ABLATION  2007  . ESOPHAGEAL MANOMETRY N/A 05/21/2016   Procedure: ESOPHAGEAL MANOMETRY (EM);   Surgeon: Lucilla Lame, MD;  Location: [facility] ENDOSCOPY;  Service: Endoscopy;  Laterality: N/A;  . ESOPHAGOGASTRODUODENOSCOPY (EGD) WITH PROPOFOL N/A 05/14/2016   Procedure: ESOPHAGOGASTRODUODENOSCOPY (EGD) WITH PROPOFOL;  Surgeon: Lucilla Lame, MD;  Location: [facility] ENDOSCOPY;  Service: Endoscopy;  Laterality: N/A;  . ESOPHAGOGASTRODUODENOSCOPY (EGD) WITH PROPOFOL N/A 04/10/2017   Procedure: ESOPHAGOGASTRODUODENOSCOPY (EGD) WITH PROPOFOL;  Surgeon: Lucilla Lame, MD;  Location: [place];  Service: Endoscopy;  Laterality: N/A;  . INSERTION OF MESH  2014  . LEFT HEART CATHETERIZATION WITH CORONARY ANGIOGRAM N/A 08/30/2013   Procedure: LEFT HEART CATHETERIZATION WITH CORONARY ANGIOGRAM;  Surgeon: Troy Sine, MD;  Location: [facility] CATH LAB;  Service: Cardiovascular;  Laterality: N/A;  . NEPHRECTOMY    . REPLACEMENT TOTAL KNEE BILATERAL Bilateral 2018   2/18, 5/18 [facility]  . ROUX-EN-Y GASTRIC BYPASS  04/27/2015   [facility]  . TUBAL LIGATION    . UMBILICAL HERNIA REPAIR      Current Outpatient Medications  Medication Sig Dispense Refill  . albuterol (VENTOLIN HFA) 108 (90 Base) MCG/ACT inhaler Inhale 2 puffs into the lungs every 6 (six) hours as needed for wheezing or shortness of breath.    Marland Kitchen amoxicillin-clavulanate (AUGMENTIN) 875-125 MG tablet Take 1 tablet by mouth. For dental    . aspirin 81 MG chewable tablet Chew 81 mg by mouth daily.    . butalbital-acetaminophen-caffeine (FIORICET, ESGIC) 50-325-40 MG tablet Take 1 tablet by mouth every 4 (four) hours as needed for headache. 20 tablet 0  . calcium-vitamin D 250-100 MG-UNIT tablet Take 1 tablet by mouth 2 (two) times daily. Reported on 06/16/2015 180 tablet 3  . carvedilol (COREG) 12.5 MG tablet Take 1 tablet (12.5 mg total)  by mouth 2 (two) times daily with a meal. 60 tablet 6  . Cholecalciferol (VITAMIN D-3) 1000 units CAPS Take 1 capsule (1,000 Units total) by mouth daily. Reported on 06/16/2015 90 capsule 3  . dexlansoprazole (DEXILANT) 60 MG  capsule Take 1 capsule (60 mg total) by mouth daily. 30 capsule 6  . gabapentin (NEURONTIN) 300 MG capsule Take 600 mg (2 tablets) by mouth daily in the morning and take 1200 mg (4 tablets) by mouth daily at night 240 capsule 3  . hydrOXYzine (VISTARIL) 50 MG capsule Take 1 capsule (50 mg total) by mouth 3 (three) times daily as needed for itching. 30 capsule 0  . lamoTRIgine (LAMICTAL) 100 MG tablet Take 1 tablet (100 mg total) by mouth daily. (Patient not taking: Reported on 06/27/2017) 60 tablet 11  . loratadine (CLARITIN) 10 MG tablet Take 1 tablet (10 mg total) by mouth daily. 90 tablet 3  . losartan (COZAAR) 100 MG tablet TAKE ONE TABLET BY MOUTH ONCE DAILY 90 tablet 3  . Melatonin 3 MG TABS Take 9 mg by mouth.     . Multiple Vitamins-Minerals (MULTIVITAMIN WITH MINERALS) tablet Take 1 tablet by mouth daily. Reported on 06/16/2015    . nitroGLYCERIN (NITROSTAT) 0.4 MG SL tablet Place 1 tablet (0.4 mg total) under the tongue every 5 (five) minutes as needed for chest pain. (may repeat every 5 minutes but seek medical help if pain persists after 3 tablets) 25 tablet 0  . ondansetron (ZOFRAN-ODT) 4 MG disintegrating tablet DISSOLVE 1 TABLET IN MOUTH EVERY 8 HOURS AS NEEDED FOR NAUSEA OR VOMITING 20 tablet 0  . oxyCODONE (OXY IR/ROXICODONE) 5 MG immediate release tablet Take 1 tablet (5 mg total) by mouth every 6 (six) hours as needed for moderate pain. 20 tablet 0  . promethazine (PHENERGAN) 25 MG tablet TAKE 1 TABLET BY MOUTH EVERY 8 HOURS AS NEEDED FOR NAUSEA OR VOMITING 30 tablet 2  . QUEtiapine (SEROQUEL) 100 MG tablet TAKE 1 TABLET BY MOUTH AT BEDTIME 90 tablet 0  . traZODone (DESYREL) 100 MG tablet Take 200 mg by mouth at bedtime.    . triamcinolone cream (KENALOG) 0.1 % APPLY TOPICALLY TWO TIMES DAILY 30 g 0  . vitamin B-12 (CYANOCOBALAMIN) 500 MCG tablet Take by mouth.     No current facility-administered medications for this visit.      Allergies:   Lisinopril   Social History:  The  patient  reports that she quit smoking about 35 years ago. Her smoking use included cigarettes. She has never used smokeless tobacco. She reports that she does not drink alcohol or use drugs.   Family History:   family history includes Alcoholism in her father; Arthritis in her mother; Breast cancer (age of onset: 37) in her mother; Diabetes in her unknown relative; Heart disease in her paternal grandmother; Hyperlipidemia in her unknown relative; Hypertension in her mother; Mental illness in her unknown relative.    Review of Systems: Review of Systems  Constitutional: Negative.   Respiratory: Positive for shortness of breath.   Cardiovascular: Positive for chest pain.  Gastrointestinal: Negative.   Musculoskeletal: Negative.   Neurological: Negative.   Psychiatric/Behavioral: Negative.   All other systems reviewed and are negative.    PHYSICAL EXAM: VS:  There were no vitals taken for this visit. , BMI There is no height or weight on file to calculate BMI. GEN: Well nourished, well developed, in no acute distress , obese HEENT: normal  Neck: no JVD, carotid bruits, or  masses Cardiac: RRR; no murmurs, rubs, or gallops,no edema  Respiratory:  clear to auscultation bilaterally, normal work of breathing GI: soft, nontender, nondistended, + BS MS: no deformity or atrophy  Skin: warm and dry, no rash Neuro:  Strength and sensation are intact Psych: euthymic mood, full affect   Recent Labs: 06/24/2017: ALT 19; BUN 10; Creatinine, Ser 0.81; Hemoglobin 14.1; Platelets 186; Potassium 3.6; Sodium 139    Lipid Panel Lab Results  Component Value Date   CHOL 260 (H) 08/30/2013   HDL 41 08/30/2013   LDLCALC 167 (H) 08/30/2013   TRIG 260 (H) 08/30/2013      Wt Readings from Last 3 Encounters:  06/27/17 220 lb 3.2 oz (99.9 kg)  06/24/17 215 lb (97.5 kg)  05/28/17 220 lb (99.8 kg)       ASSESSMENT AND PLAN:  Shortness of breath - Plan: EKG 12-Lead Atypical in nature, recent  normal echocardiogram and cardiac catheterization Recommended regular walking program Limited secondary to chronic knee pain  Chest pain, unspecified type - Plan: EKG 12-Lead Atypical chest pain on the left Previous echocardiogram and cardiac catheterization results reviewed with her showing nonobstructive disease 2015 No further cardiac testing needed at this time We could consider CT scan chest if symptoms persist, recommend we wait until after she has seen GI  Type 2 diabetes mellitus without complication, without long-term current use of insulin ([place]) Sugars dramatically improved with weight loss now off all of her medications  Essential hypertension Concerned about low blood pressure after recent weight loss Recommended she decrease Coreg down to 12.5 mg daily, hold the amlodipine Additional medication decreases may be needed if she continues to require low blood pressures at home  Sleep apnea, unspecified type Tolerating her CPAP  Status post gastric bypass for obesity Dramatic weight loss since her surgery Reports losing several pounds per week Small meal, once a day only given chronic nausea  nausea Chronic issue, worse in the morning, periodically during the daytime especially if she eats more than once Scheduled to see GI next month Recommended she talk with primary care concerning cutting back on some of her medications Reports that she takes very large amounts of Neurontin every evening  Disposition:   Follow up as needed She will call us with blood pressure numbers over the next several weeks   Total encounter time more than 60 minutes  Greater than 50% was spent in counseling and coordination of care with the patient  Patient was seen in consultation for Dr. Caryl Bis and will be referred back to his office for ongoing care of the issues detailed above   No orders of the defined types were placed in this encounter.    Signed, Esmond Plants, M.D.,  Ph.D. 07/05/2017  [place], [place]

## 2017-07-08 ENCOUNTER — Ambulatory Visit: Payer: Self-pay | Admitting: Internal Medicine

## 2017-07-08 ENCOUNTER — Other Ambulatory Visit: Payer: Self-pay

## 2017-07-08 ENCOUNTER — Ambulatory Visit: Payer: Self-pay

## 2017-07-09 ENCOUNTER — Ambulatory Visit: Payer: Self-pay | Admitting: Cardiovascular Disease

## 2017-07-10 ENCOUNTER — Ambulatory Visit: Payer: PPO | Admitting: Podiatry

## 2017-07-14 DIAGNOSIS — Z803 Family history of malignant neoplasm of breast: Secondary | ICD-10-CM | POA: Diagnosis not present

## 2017-07-14 DIAGNOSIS — Z8553 Personal history of malignant neoplasm of renal pelvis: Secondary | ICD-10-CM | POA: Diagnosis not present

## 2017-07-14 DIAGNOSIS — Z800 Family history of malignant neoplasm of digestive organs: Secondary | ICD-10-CM | POA: Diagnosis not present

## 2017-07-14 DIAGNOSIS — C169 Malignant neoplasm of stomach, unspecified: Secondary | ICD-10-CM | POA: Diagnosis not present

## 2017-07-14 DIAGNOSIS — C649 Malignant neoplasm of unspecified kidney, except renal pelvis: Secondary | ICD-10-CM | POA: Diagnosis not present

## 2017-07-16 ENCOUNTER — Other Ambulatory Visit (INDEPENDENT_AMBULATORY_CARE_PROVIDER_SITE_OTHER): Payer: PPO

## 2017-07-16 DIAGNOSIS — T148XXA Other injury of unspecified body region, initial encounter: Secondary | ICD-10-CM

## 2017-07-16 DIAGNOSIS — L299 Pruritus, unspecified: Secondary | ICD-10-CM | POA: Diagnosis not present

## 2017-07-16 DIAGNOSIS — I10 Essential (primary) hypertension: Secondary | ICD-10-CM | POA: Diagnosis not present

## 2017-07-16 LAB — BASIC METABOLIC PANEL
BUN: 11 mg/dL (ref 6–23)
CALCIUM: 9 mg/dL (ref 8.4–10.5)
CO2: 27 meq/L (ref 19–32)
Chloride: 109 mEq/L (ref 96–112)
Creatinine, Ser: 0.77 mg/dL (ref 0.40–1.20)
GFR: 101.29 mL/min (ref >60.00)
GLUCOSE: 114 mg/dL — AB (ref 70–99)
Potassium: 4.6 mEq/L (ref 3.5–5.1)
SODIUM: 142 meq/L (ref 135–145)

## 2017-07-16 LAB — CBC
HEMATOCRIT: 42.5 % (ref 36.0–46.0)
Hemoglobin: 14.1 g/dL (ref 12.0–15.0)
MCHC: 33.2 g/dL (ref 30.0–36.0)
MCV: 97 fl (ref 78.0–100.0)
Platelets: 197 10*3/uL (ref 150.0–400.0)
RBC: 4.38 Mil/uL (ref 3.87–5.11)
RDW: 14.2 % (ref 11.5–15.5)
WBC: 3.3 10*3/uL — AB (ref 4.0–10.5)

## 2017-07-16 LAB — HEPATIC FUNCTION PANEL
ALK PHOS: 71 U/L (ref 39–117)
ALT: 18 U/L (ref 0–35)
AST: 17 U/L (ref 0–37)
Albumin: 3.5 g/dL (ref 3.5–5.2)
BILIRUBIN TOTAL: 0.7 mg/dL (ref 0.2–1.2)
Bilirubin, Direct: 0.1 mg/dL (ref 0.0–0.3)
Total Protein: 6.1 g/dL (ref 6.0–8.3)

## 2017-07-17 ENCOUNTER — Ambulatory Visit: Payer: PPO | Admitting: Psychiatry

## 2017-07-17 NOTE — Progress Notes (Signed)
Cardiology Office Note  Date:  07/18/2017   ID:  Denise Davis, DOB Aug 31, 1965, MRN [id number]  PCP:  Leone Haven, MD   Chief Complaint  Patient presents with  . OTHER    Syncope feels like since she started Micardis symptoms occurred now taking QOD. Meds reviewed verbally with pt.    HPI:  52 year old woman with past medical history of chronic intermittent nausea since having gastric bypass surgery anxiety and depression grief of losing her husband intermittent vertigo symptoms  black outs, Questionable seizure disorder. Former smoker normal echocardiogram and cardiac catheterization Who presents for f/u of her shortness of breath, near syncope  Recent medication medication changes after recall on Losartan changed to micardis, was taking 40 mg daily Feels dizzy Now taking it every other day  Weight has been stable down approximately 200 pounds Trying not to lose weight as fast  Denise Davis with her boyfriend in 73 wheeler  traveling to [place] on a frequent basis Has leg swelling by the time she gets to [place] if there is anything she can do  Left knee surgery 07/2016, reports that healed well with no residual pain  Prior history of chest pain and shortness of breath This has been less of an issue  Was previously weight 400 , lost down to 217   Echocardiogram September 2017 Essentially normal study  EKG personally reviewed by myself on todays visit NSR rate 76 bpm no significant ST or T wave changes  Cardiac cath May 2015 Minimal nonobstructive coronary disease Performed by Dr. Claiborne Billings   PMH:   has a past medical history of Allergic rhinitis, Anxiety, Depression, Diabetes mellitus, DVT of leg (deep venous thrombosis) ([place]) (12/2007), GERD (gastroesophageal reflux disease), H/O hiatal hernia, Hyperlipidemia, Hypertension, Morbid obesity ([place]), Pericarditis, Renal cell cancer ([place]) (2009), and Seizure disorder ([place]).  PSH:    Past Surgical History:   Procedure Laterality Date  . CHOLECYSTECTOMY N/A 05/02/2017   Procedure: LAPAROSCOPIC CHOLECYSTECTOMY WITH INTRAOPERATIVE CHOLANGIOGRAM;  Surgeon: Johnathan Hausen, MD;  Location: [facility];  Service: General;  Laterality: N/A;  . ENDOMETRIAL ABLATION  2007  . ESOPHAGEAL MANOMETRY N/A 05/21/2016   Procedure: ESOPHAGEAL MANOMETRY (EM);  Surgeon: Lucilla Lame, MD;  Location: [facility];  Service: Endoscopy;  Laterality: N/A;  . ESOPHAGOGASTRODUODENOSCOPY (EGD) WITH PROPOFOL N/A 05/14/2016   Procedure: ESOPHAGOGASTRODUODENOSCOPY (EGD) WITH PROPOFOL;  Surgeon: Lucilla Lame, MD;  Location: [facility];  Service: Endoscopy;  Laterality: N/A;  . ESOPHAGOGASTRODUODENOSCOPY (EGD) WITH PROPOFOL N/A 04/10/2017   Procedure: ESOPHAGOGASTRODUODENOSCOPY (EGD) WITH PROPOFOL;  Surgeon: Lucilla Lame, MD;  Location: [facility];  Service: Endoscopy;  Laterality: N/A;  . INSERTION OF MESH  2014  . LEFT HEART CATHETERIZATION WITH CORONARY ANGIOGRAM N/A 08/30/2013   Procedure: LEFT HEART CATHETERIZATION WITH CORONARY ANGIOGRAM;  Surgeon: Troy Sine, MD;  Location: [facility] CATH LAB;  Service: Cardiovascular;  Laterality: N/A;  . NEPHRECTOMY    . REPLACEMENT TOTAL KNEE BILATERAL Bilateral 2018   2/18, 5/18 [facility]  . ROUX-EN-Y GASTRIC BYPASS  04/27/2015   [facility]  . TUBAL LIGATION    . UMBILICAL HERNIA REPAIR      Current Outpatient Medications  Medication Sig Dispense Refill  . albuterol (VENTOLIN HFA) 108 (90 Base) MCG/ACT inhaler Inhale 2 puffs into the lungs every 6 (six) hours as needed for wheezing or shortness of breath.    Marland Kitchen amoxicillin-clavulanate (AUGMENTIN) 875-125 MG tablet Take 1 tablet by mouth. For dental    . aspirin 81 MG chewable tablet Chew 81 mg  by mouth daily.    . butalbital-acetaminophen-caffeine (FIORICET, ESGIC) 50-325-40 MG tablet Take 1 tablet by mouth every 4 (four) hours as needed for headache. 20 tablet 0  . calcium-vitamin D 250-100 MG-UNIT tablet Take 1 tablet by mouth 2 (two) times  daily. Reported on 06/16/2015 180 tablet 3  . carvedilol (COREG) 12.5 MG tablet Take 1 tablet (12.5 mg total) by mouth 2 (two) times daily with a meal. 60 tablet 6  . Cholecalciferol (VITAMIN D-3) 1000 units CAPS Take 1 capsule (1,000 Units total) by mouth daily. Reported on 06/16/2015 90 capsule 3  . dexlansoprazole (DEXILANT) 60 MG capsule Take 1 capsule (60 mg total) by mouth daily. 30 capsule 6  . gabapentin (NEURONTIN) 300 MG capsule Take 600 mg (2 tablets) by mouth daily in the morning and take 1200 mg (4 tablets) by mouth daily at night 240 capsule 3  . hydrOXYzine (VISTARIL) 50 MG capsule Take 1 capsule (50 mg total) by mouth 3 (three) times daily as needed for itching. 30 capsule 0  . lamoTRIgine (LAMICTAL) 100 MG tablet Take 1 tablet (100 mg total) by mouth daily. 60 tablet 11  . loratadine (CLARITIN) 10 MG tablet Take 1 tablet (10 mg total) by mouth daily. 90 tablet 3  . Melatonin 3 MG TABS Take 9 mg by mouth.     . Multiple Vitamins-Minerals (MULTIVITAMIN WITH MINERALS) tablet Take 1 tablet by mouth daily. Reported on 06/16/2015    . nitroGLYCERIN (NITROSTAT) 0.4 MG SL tablet Place 1 tablet (0.4 mg total) under the tongue every 5 (five) minutes as needed for chest pain. (may repeat every 5 minutes but seek medical help if pain persists after 3 tablets) 25 tablet 0  . ondansetron (ZOFRAN-ODT) 4 MG disintegrating tablet DISSOLVE 1 TABLET IN MOUTH EVERY 8 HOURS AS NEEDED FOR NAUSEA OR VOMITING 20 tablet 0  . oxyCODONE (OXY IR/ROXICODONE) 5 MG immediate release tablet Take 1 tablet (5 mg total) by mouth every 6 (six) hours as needed for moderate pain. 20 tablet 0  . promethazine (PHENERGAN) 25 MG tablet TAKE 1 TABLET BY MOUTH EVERY 8 HOURS AS NEEDED FOR NAUSEA OR VOMITING 30 tablet 2  . telmisartan (MICARDIS) 20 MG tablet Take 20 mg by mouth every other day.    . triamcinolone cream (KENALOG) 0.1 % APPLY TOPICALLY TWO TIMES DAILY 30 g 0  . vitamin B-12 (CYANOCOBALAMIN) 500 MCG tablet Take by  mouth.     No current facility-administered medications for this visit.      Allergies:   Lisinopril   Social History:  The patient  reports that she quit smoking about 35 years ago. Her smoking use included cigarettes. She has never used smokeless tobacco. She reports that she does not drink alcohol or use drugs.   Family History:   family history includes Alcoholism in her father; Arthritis in her mother; Breast cancer (age of onset: 34) in her mother; Diabetes in her unknown relative; Heart disease in her paternal grandmother; Hyperlipidemia in her unknown relative; Hypertension in her mother; Mental illness in her unknown relative.    Review of Systems: Review of Systems  Constitutional: Negative.   Respiratory: Negative.   Cardiovascular: Positive for leg swelling.  Gastrointestinal: Negative.   Musculoskeletal: Negative.   Neurological: Negative.   Psychiatric/Behavioral: Negative.   All other systems reviewed and are negative.    PHYSICAL EXAM: VS:  BP 128/83 (BP Location: Left Arm, Patient Position: Sitting, Cuff Size: Large)   Pulse 76   Ht 5'  5" (1.651 m)   Wt 217 lb 8 oz (98.7 kg)   BMI 36.19 kg/m  , BMI Body mass index is 36.19 kg/m. Constitutional:  oriented to person, place, and time. No distress. obese HENT:  Head: Normocephalic and atraumatic.  Eyes:  no discharge. No scleral icterus.  Neck: Normal range of motion. Neck supple. No JVD present.  Cardiovascular: Normal rate, regular rhythm, normal heart sounds and intact distal pulses. Exam reveals no gallop and no friction rub. No edema No murmur heard. Pulmonary/Chest: Effort normal and breath sounds normal. No stridor. No respiratory distress.  no wheezes.  no rales.  no tenderness.  Abdominal: Soft.  no distension.  no tenderness.  Musculoskeletal: Normal range of motion.  no  tenderness or deformity.  Neurological:  normal muscle tone. Coordination normal. No atrophy Skin: Skin is warm and dry. No rash  noted. not diaphoretic.  Psychiatric:  normal mood and affect. behavior is normal. Thought content normal.   Recent Labs: 07/16/2017: ALT 18; BUN 11; Creatinine, Ser 0.77; Hemoglobin 14.1; Platelets 197.0; Potassium 4.6; Sodium 142    Lipid Panel Lab Results  Component Value Date   CHOL 260 (H) 08/30/2013   HDL 41 08/30/2013   LDLCALC 167 (H) 08/30/2013   TRIG 260 (H) 08/30/2013      Wt Readings from Last 3 Encounters:  07/18/17 217 lb 8 oz (98.7 kg)  06/27/17 220 lb 3.2 oz (99.9 kg)  06/24/17 215 lb (97.5 kg)       ASSESSMENT AND PLAN:  Shortness of breath - Plan: EKG 12-Lead  normal echocardiogram and cardiac catheterization Recommended regular walking program Sx improved with weight loss  Chest pain, unspecified type - Plan: EKG 12-Lead Previous Atypical chest pain on the left  echocardiogram  cardiac catheterizationher showing nonobstructive disease 2015 No further cardiac testing needed at this time  Type 2 diabetes mellitus without complication, without long-term current use of insulin (HCC) Dramatically improved now off her medications and weight continues to drop slowly through dietary changes  Essential hypertension Recommend she take Micardis 20 mg daily rather than 40 mg every other day Blood pressure was running low on 40 mg daily with orthostasis  Sleep apnea, unspecified type Tolerating her CPAP Stable  Status post gastric bypass for obesity Small meal, previously with chronic nausea  nausea Previously chronic issue, noted to manage her small diet given recent gastric bypass  Leg swelling: Dependant edema Recommended compression hose when traveling in the truck   Total encounter time more than 25 minutes  Greater than 50% was spent in counseling and coordination of care with the patient  F/u as needed   Orders Placed This Encounter  Procedures  . EKG 12-Lead     Signed, Esmond Plants, M.D., Ph.D. 07/18/2017  [place], [place]

## 2017-07-18 ENCOUNTER — Ambulatory Visit (INDEPENDENT_AMBULATORY_CARE_PROVIDER_SITE_OTHER): Payer: PPO | Admitting: Cardiovascular Disease

## 2017-07-18 ENCOUNTER — Encounter: Payer: Self-pay | Admitting: Cardiovascular Disease

## 2017-07-18 VITALS — BP 128/83 | HR 76 | Ht 65.0 in | Wt 217.5 lb

## 2017-07-18 DIAGNOSIS — Z9884 Bariatric surgery status: Secondary | ICD-10-CM

## 2017-07-18 DIAGNOSIS — R079 Chest pain, unspecified: Secondary | ICD-10-CM

## 2017-07-18 DIAGNOSIS — E119 Type 2 diabetes mellitus without complications: Secondary | ICD-10-CM | POA: Diagnosis not present

## 2017-07-18 DIAGNOSIS — R0602 Shortness of breath: Secondary | ICD-10-CM | POA: Diagnosis not present

## 2017-07-18 DIAGNOSIS — G4730 Sleep apnea, unspecified: Secondary | ICD-10-CM

## 2017-07-18 DIAGNOSIS — I10 Essential (primary) hypertension: Secondary | ICD-10-CM

## 2017-07-18 MED ORDER — TELMISARTAN 20 MG PO TABS: 20.0000 mg | ORAL_TABLET | Freq: Every day | ORAL | 3 refills | Status: DC

## 2017-07-18 NOTE — Patient Instructions (Signed)
Medication Instructions:   Take micardis 20 mg daily  Labwork:  No new labs needed  Testing/Procedures:  No further testing at this time   Follow-Up: It was a pleasure seeing you in the office today. Please call us if you have new issues that need to be addressed before your next appt.  [phone]  Your physician wants you to follow-up in: 12 months as needed.  You will receive a reminder letter in the mail two months in advance. If you don't receive a letter, please call our office to schedule the follow-up appointment.  If you need a refill on your cardiac medications before your next appointment, please call your pharmacy.  For educational health videos Log in to : [email or web address] Or : [email or web address], password : [email or web address]

## 2017-07-21 ENCOUNTER — Telehealth: Payer: Self-pay | Admitting: Family Medicine

## 2017-07-21 ENCOUNTER — Encounter: Payer: Self-pay | Admitting: Family Medicine

## 2017-07-21 ENCOUNTER — Other Ambulatory Visit: Payer: Self-pay | Admitting: Family Medicine

## 2017-07-21 DIAGNOSIS — D72819 Decreased white blood cell count, unspecified: Secondary | ICD-10-CM

## 2017-07-21 NOTE — Telephone Encounter (Signed)
Copied from [place] [id number]. Topic: Quick Communication - See Telephone Encounter >> Jul 21, 2017 10:25 AM Ether Griffins B wrote: CRM for notification. See Telephone encounter for: 07/21/17.  Pt is calling about lab results she had missed a call from [place] at [place] and labs where released to [place].

## 2017-07-23 DIAGNOSIS — F3340 Major depressive disorder, recurrent, in remission, unspecified: Secondary | ICD-10-CM | POA: Insufficient documentation

## 2017-07-23 DIAGNOSIS — C649 Malignant neoplasm of unspecified kidney, except renal pelvis: Secondary | ICD-10-CM | POA: Insufficient documentation

## 2017-07-24 ENCOUNTER — Other Ambulatory Visit: Payer: Self-pay | Admitting: Podiatry

## 2017-07-24 ENCOUNTER — Encounter: Payer: Self-pay | Admitting: Podiatry

## 2017-07-24 ENCOUNTER — Ambulatory Visit (INDEPENDENT_AMBULATORY_CARE_PROVIDER_SITE_OTHER): Payer: PPO

## 2017-07-24 ENCOUNTER — Ambulatory Visit (INDEPENDENT_AMBULATORY_CARE_PROVIDER_SITE_OTHER): Payer: PPO | Admitting: Podiatry

## 2017-07-24 DIAGNOSIS — M2011 Hallux valgus (acquired), right foot: Secondary | ICD-10-CM

## 2017-07-24 DIAGNOSIS — M79675 Pain in left toe(s): Secondary | ICD-10-CM

## 2017-07-24 DIAGNOSIS — B351 Tinea unguium: Secondary | ICD-10-CM

## 2017-07-24 DIAGNOSIS — M21611 Bunion of right foot: Principal | ICD-10-CM

## 2017-07-24 DIAGNOSIS — M2012 Hallux valgus (acquired), left foot: Secondary | ICD-10-CM | POA: Diagnosis not present

## 2017-07-24 DIAGNOSIS — E1142 Type 2 diabetes mellitus with diabetic polyneuropathy: Secondary | ICD-10-CM | POA: Diagnosis not present

## 2017-07-24 DIAGNOSIS — M79674 Pain in right toe(s): Secondary | ICD-10-CM | POA: Diagnosis not present

## 2017-07-24 DIAGNOSIS — R52 Pain, unspecified: Secondary | ICD-10-CM | POA: Diagnosis not present

## 2017-07-24 MED ORDER — CLOTRIMAZOLE-BETAMETHASONE 1-0.05 % EX CREA: 1.0000 "application " | TOPICAL_CREAM | Freq: Two times a day (BID) | CUTANEOUS | 0 refills | Status: DC

## 2017-07-24 NOTE — Addendum Note (Signed)
Addended byDeidre Ala, Yoshi Mancillas L on: 07/24/2017 01:15 PM   Modules accepted: Orders

## 2017-07-24 NOTE — Progress Notes (Signed)
This patient presents the office with chief complaint of pain in her right forefoot as well as concerns about her toenails on her right foot.  She says her toenails are thick and disfigured and the great toenail is extremely brittle which causes her to use acrylic toenail.  She also relates that she has occasional peeling between the toes on her feet. She says that the skin is peeling between her toes and on the bottoms of both feet for over 3 years.  She says she has attempted  Using  lamisil in an  effort to eliminate the peeling.  She also states that she has been experiencing pain in her right forefoot for the last 4 weeks.  She says she is having pain and discomfort walking and wearing her shoes.  She denies any history of trauma or injury to the foot.  She does give a history of having been about 400 pounds and following surgery. She now weighs about 220 pounds.  She presents the office today for an evaluation and treatment of both feet.  General Appearance  Alert, conversant and in no acute stress.  Vascular  Dorsalis pedis and posterior tibial  pulses are palpable  bilaterally.  Capillary return is within normal limits  bilaterally. Temperature is within normal limits  bilaterally.  Neurologic  Senn-Weinstein monofilament wire test within normal limits  bilaterally. Muscle power within normal limits bilaterally.  Nails Thick disfigured discolored nails with subungual debris  from hallux to fifth toes bilaterally. No evidence of bacterial infection or drainage bilaterally.  Orthopedic  No limitations of motion of motion feet .  No crepitus or effusions noted.  Severe HAV 1st MPJ  B/L.  Skin  Dry skin is present on the plantar aspect of both feet.  No interdigital maceration or peeling is noted today.  Patient states pinch callus  noted on the right hallux.    Onychomycosis  B/L  Hav 1st MPJ  B/L.  IE  X-rays taken revealing severe HAV deformity first MPJ right .  Arthritic changes noted  at  the [place]  Right.  We discussed Lamisil usage for her fungal infected nails.  We discussed the side effects and complications of taking this medicine.  We also discussed her severe bunion deformity which  she would like to have surgically corrected.  I told her she should make an appointment with  this office the surgeon for a surgical consult for bunion correction.   Gardiner Barefoot DPM

## 2017-07-28 ENCOUNTER — Other Ambulatory Visit (INDEPENDENT_AMBULATORY_CARE_PROVIDER_SITE_OTHER): Payer: PPO

## 2017-07-28 DIAGNOSIS — D72819 Decreased white blood cell count, unspecified: Secondary | ICD-10-CM | POA: Diagnosis not present

## 2017-07-28 LAB — CBC WITH DIFFERENTIAL/PLATELET
Basophils Absolute: 32 cells/uL (ref 0–200)
Basophils Relative: 0.7 %
EOS ABS: 122 {cells}/uL (ref 15–500)
EOS PCT: 2.7 %
HEMATOCRIT: 39.4 % (ref 35.0–45.0)
Hemoglobin: 13.5 g/dL (ref 11.7–15.5)
LYMPHS ABS: 2349 {cells}/uL (ref 850–3900)
MCH: 31.8 pg (ref 27.0–33.0)
MCHC: 34.3 g/dL (ref 32.0–36.0)
MCV: 92.7 fL (ref 80.0–100.0)
MONOS PCT: 9.5 %
MPV: 12.5 fL (ref 7.5–12.5)
NEUTROS PCT: 34.9 %
Neutro Abs: 1571 cells/uL (ref 1500–7800)
Platelets: 217 10*3/uL (ref 140–400)
RBC: 4.25 10*6/uL (ref 3.80–5.10)
RDW: 12.9 % (ref 11.0–15.0)
Total Lymphocyte: 52.2 %
WBC mixed population: 428 cells/uL (ref 200–950)
WBC: 4.5 10*3/uL (ref 3.8–10.8)

## 2017-07-28 NOTE — Addendum Note (Signed)
Addended by: Arby Barrette on: 07/28/2017 03:53 PM   Modules accepted: Orders

## 2017-07-29 ENCOUNTER — Ambulatory Visit (INDEPENDENT_AMBULATORY_CARE_PROVIDER_SITE_OTHER): Payer: PPO | Admitting: Family Medicine

## 2017-07-29 ENCOUNTER — Encounter: Payer: Self-pay | Admitting: Family Medicine

## 2017-07-29 VITALS — BP 130/84 | HR 82 | Temp 99.5°F | Resp 16 | Wt 220.5 lb

## 2017-07-29 DIAGNOSIS — R590 Localized enlarged lymph nodes: Secondary | ICD-10-CM

## 2017-07-29 DIAGNOSIS — J3089 Other allergic rhinitis: Secondary | ICD-10-CM

## 2017-07-29 DIAGNOSIS — H66002 Acute suppurative otitis media without spontaneous rupture of ear drum, left ear: Secondary | ICD-10-CM

## 2017-07-29 MED ORDER — FLUTICASONE PROPIONATE 50 MCG/ACT NA SUSP: 2.0000 | Freq: Every day | NASAL | 6 refills | Status: DC

## 2017-07-29 MED ORDER — AMOXICILLIN-POT CLAVULANATE 875-125 MG PO TABS: 1.0000 | ORAL_TABLET | Freq: Two times a day (BID) | ORAL | 0 refills | Status: DC

## 2017-07-29 NOTE — Progress Notes (Signed)
Subjective:    Patient ID: Denise Davis, female    DOB: September 02, 1965, 52 y.o.   MRN: [id number]  HPI  Denise Davis is a 52 year old female who presents today with a "lump" on the left side of her neck that has been present for one week. She reports that this raised area has been changing and has decreased including a significant decrease since yesterday.  She denies fever, chills, sweats, night sweats, unexplained weight loss, rhinitis, sinus pressure/pain, N/V/D. Associated left ear pain has been present with post nasal drip, itchy/watery eyes. Treatment with loratadine which provides limited benefit. She denies recent sick contact exposure or flu exposure. She is not a smoker. History of allergic rhinitis. No history of asthma/COPD No recent foreign travel. History of renal cell carcinoma 2009 per patient  Wt Readings from Last 3 Encounters:  07/29/17 220 lb 8 oz (100 kg)  07/18/17 217 lb 8 oz (98.7 kg)  06/27/17 220 lb 3.2 oz (99.9 kg)     Review of Systems  Constitutional: Negative for chills, fatigue and fever.  HENT: Positive for ear pain, postnasal drip and rhinorrhea. Negative for congestion, sinus pressure, sinus pain, sneezing and tinnitus.   Eyes: Positive for itching.  Respiratory: Negative for cough, shortness of breath and wheezing.   Cardiovascular: Negative for chest pain and palpitations.  Gastrointestinal: Negative for abdominal pain, diarrhea, nausea and vomiting.  Genitourinary: Negative for dysuria.  Musculoskeletal: Negative for myalgias.  Skin: Negative for rash.   Past Medical History:  Diagnosis Date  . Allergic rhinitis   . Anxiety   . Depression   . Diabetes mellitus    taken off meds in beginning fo 2018 dueto weight loss   . DVT of leg (deep venous thrombosis) ([place]) 12/2007   left leg, given warfarin for 6 mos  . GERD (gastroesophageal reflux disease)   . H/O hiatal hernia   . Hyperlipidemia   . Hypertension   . Morbid obesity ([place])   .  Pericarditis    Diagnosed/treated at [facility]; per patient 4 episodes per cardiologist Dr. Edwin Dada persistant chest pain unclear if pericarditis verusus neuropsychogenic   . Renal cell cancer ([place]) 2009   S/p nephrectomy (left)  . Seizure disorder ([place])    (04/08/17)- None in over 1 yr, no meds.     Social History   Socioeconomic History  . Marital status: Widowed    Spouse name: Not on file  . Number of children: 2  . Years of education: 52  . Highest education level: High school graduate  Occupational History  . Occupation: Disabled  Social Needs  . Financial resource strain: Not on file  . Food insecurity:    Worry: Not on file    Inability: Not on file  . Transportation needs:    Medical: Not on file    Non-medical: Not on file  Tobacco Use  . Smoking status: Former Smoker    Types: Cigarettes    Last attempt to quit: 1984    Years since quitting: 35.3  . Smokeless tobacco: Never Used  Substance and Sexual Activity  . Alcohol use: No    Frequency: Never  . Drug use: No  . Sexual activity: Not Currently  Lifestyle  . Physical activity:    Days per week: Not on file    Minutes per session: Not on file  . Stress: Not on file  Relationships  . Social connections:    Talks on phone: Not on file  Gets together: Not on file    Attends religious service: Not on file    Active member of club or organization: Not on file    Attends meetings of clubs or organizations: Not on file    Relationship status: Not on file  . Intimate partner violence:    Fear of current or ex partner: Not on file    Emotionally abused: Not on file    Physically abused: Not on file    Forced sexual activity: Not on file  Other Topics Concern  . 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   Right-handed.   One cup caffeine per day.          Past  Surgical History:  Procedure Laterality Date  . CHOLECYSTECTOMY N/A 05/02/2017   Procedure: LAPAROSCOPIC CHOLECYSTECTOMY WITH INTRAOPERATIVE CHOLANGIOGRAM;  Surgeon: Johnathan Hausen, MD;  Location: [facility];  Service: General;  Laterality: N/A;  . ENDOMETRIAL ABLATION  2007  . ESOPHAGEAL MANOMETRY N/A 05/21/2016   Procedure: ESOPHAGEAL MANOMETRY (EM);  Surgeon: Lucilla Lame, MD;  Location: [facility] ENDOSCOPY;  Service: Endoscopy;  Laterality: N/A;  . ESOPHAGOGASTRODUODENOSCOPY (EGD) WITH PROPOFOL N/A 05/14/2016   Procedure: ESOPHAGOGASTRODUODENOSCOPY (EGD) WITH PROPOFOL;  Surgeon: Lucilla Lame, MD;  Location: [facility] ENDOSCOPY;  Service: Endoscopy;  Laterality: N/A;  . ESOPHAGOGASTRODUODENOSCOPY (EGD) WITH PROPOFOL N/A 04/10/2017   Procedure: ESOPHAGOGASTRODUODENOSCOPY (EGD) WITH PROPOFOL;  Surgeon: Lucilla Lame, MD;  Location: [place];  Service: Endoscopy;  Laterality: N/A;  . INSERTION OF MESH  2014  . LEFT HEART CATHETERIZATION WITH CORONARY ANGIOGRAM N/A 08/30/2013   Procedure: LEFT HEART CATHETERIZATION WITH CORONARY ANGIOGRAM;  Surgeon: Troy Sine, MD;  Location: [facility] CATH LAB;  Service: Cardiovascular;  Laterality: N/A;  . NEPHRECTOMY    . REPLACEMENT TOTAL KNEE BILATERAL Bilateral 2018   2/18, 5/18 [facility]  . ROUX-EN-Y GASTRIC BYPASS  04/27/2015   [facility]  . TUBAL LIGATION    . UMBILICAL HERNIA REPAIR      Family History  Problem Relation Age of Onset  . Hypertension Mother   . Breast cancer Mother 68  . Arthritis Mother   . Heart disease Paternal Grandmother   . Alcoholism Father   . Hyperlipidemia Unknown        Parent  . Mental illness Unknown        Parent  . Diabetes Unknown        Parent    Allergies  Allergen Reactions  . Lisinopril Cough    Current Outpatient Medications on File Prior to Visit  Medication Sig Dispense Refill  . albuterol (VENTOLIN HFA) 108 (90 Base) MCG/ACT inhaler Inhale 2 puffs into the lungs every 6 (six) hours as needed for wheezing or shortness  of breath.    Marland Kitchen amoxicillin-clavulanate (AUGMENTIN) 875-125 MG tablet Take 1 tablet by mouth. For dental    . aspirin 81 MG chewable tablet Chew 81 mg by mouth daily.    . butalbital-acetaminophen-caffeine (FIORICET, ESGIC) 50-325-40 MG tablet Take 1 tablet by mouth every 4 (four) hours as needed for headache. 20 tablet 0  . calcium-vitamin D 250-100 MG-UNIT tablet Take 1 tablet by mouth 2 (two) times daily. Reported on 06/16/2015 180 tablet 3  . carvedilol (COREG) 12.5 MG tablet Take 1 tablet (12.5 mg total) by mouth 2 (two) times daily with a meal. 60 tablet 6  .  Cholecalciferol (VITAMIN D-3) 1000 units CAPS Take 1 capsule (1,000 Units total) by mouth daily. Reported on 06/16/2015 90 capsule 3  . clotrimazole-betamethasone (LOTRISONE) cream Apply 1 application topically 2 (two) times daily. 30 g 0  . dexlansoprazole (DEXILANT) 60 MG capsule Take 1 capsule (60 mg total) by mouth daily. 30 capsule 6  . gabapentin (NEURONTIN) 300 MG capsule Take 600 mg (2 tablets) by mouth daily in the morning and take 1200 mg (4 tablets) by mouth daily at night 240 capsule 3  . hydrOXYzine (VISTARIL) 50 MG capsule Take 1 capsule (50 mg total) by mouth 3 (three) times daily as needed for itching. 30 capsule 0  . lamoTRIgine (LAMICTAL) 100 MG tablet Take 1 tablet (100 mg total) by mouth daily. 60 tablet 11  . loratadine (CLARITIN) 10 MG tablet Take 1 tablet (10 mg total) by mouth daily. 90 tablet 3  . Melatonin 3 MG TABS Take 9 mg by mouth.     . Multiple Vitamins-Minerals (MULTIVITAMIN WITH MINERALS) tablet Take 1 tablet by mouth daily. Reported on 06/16/2015    . nitroGLYCERIN (NITROSTAT) 0.4 MG SL tablet Place 1 tablet (0.4 mg total) under the tongue every 5 (five) minutes as needed for chest pain. (may repeat every 5 minutes but seek medical help if pain persists after 3 tablets) 25 tablet 0  . ondansetron (ZOFRAN-ODT) 4 MG disintegrating tablet DISSOLVE 1 TABLET IN MOUTH EVERY 8 HOURS AS NEEDED FOR NAUSEA OR VOMITING  20 tablet 0  . oxyCODONE (OXY IR/ROXICODONE) 5 MG immediate release tablet Take 1 tablet (5 mg total) by mouth every 6 (six) hours as needed for moderate pain. 20 tablet 0  . promethazine (PHENERGAN) 25 MG tablet TAKE 1 TABLET BY MOUTH EVERY 8 HOURS AS NEEDED FOR NAUSEA OR VOMITING 30 tablet 2  . telmisartan (MICARDIS) 20 MG tablet Take 1 tablet (20 mg total) by mouth daily. 90 tablet 3  . triamcinolone cream (KENALOG) 0.1 % APPLY TOPICALLY TWO TIMES DAILY 30 g 0  . vitamin B-12 (CYANOCOBALAMIN) 500 MCG tablet Take by mouth.     No current facility-administered medications on file prior to visit.     BP 130/84 (BP Location: Left Arm, Patient Position: Sitting, Cuff Size: Large)   Pulse 82   Temp 99.5 F (37.5 C) (Oral)   Resp 16   Wt 220 lb 8 oz (100 kg)   SpO2 96%   BMI 36.69 kg/m       Objective:   Physical Exam  Constitutional: She appears well-developed and well-nourished.  HENT:  Right Ear: Tympanic membrane normal.  Left Ear: Tympanic membrane is erythematous and bulging.  Nose: Rhinorrhea present. Right sinus exhibits no maxillary sinus tenderness and no frontal sinus tenderness. Left sinus exhibits no maxillary sinus tenderness and no frontal sinus tenderness.  Mouth/Throat: Oropharynx is clear and moist and mucous membranes are normal.  Eyes: Pupils are equal, round, and reactive to light. No scleral icterus.  Neck: Neck supple.  Cardiovascular: Normal rate and regular rhythm.  Pulmonary/Chest: Effort normal and breath sounds normal. She has no wheezes. She has no rales.  Lymphadenopathy:  Anterior cervical lymph node  mildly enlarged <1 cm, movable, and non tender.  Skin: Skin is warm and dry. No erythema.       Assessment & Plan:  1. Acute suppurative otitis media of left ear without spontaneous rupture of tympanic membrane, recurrence not specified Exam and history are consistent with AOM; low grade fever present on exam today; will treat  with 5 day course of  Augmentin for symptoms. Kidney function WNL: Last GFR on 07/16/17: 101.29.  - amoxicillin-clavulanate (AUGMENTIN) 875-125 MG tablet; Take 1 tablet by mouth 2 (two) times daily.  Dispense: 10 tablet; Refill: 0  2. Lymphadenopathy, anterior cervical One anterior cervical lymph node mildly enlarged, non tender, and movable. Improving per patient report with decreasing size and this is most likely due to AOM noted above.  She will monitor for any increase or new symptoms and follow up if an increase in size occurs or does not resolve in 2 to 3 weeks.   3. Seasonal allergic rhinitis due to other allergic trigger Symptoms are noted that are not fully controlled with loratadine; advise adding flonase today.  - fluticasone (FLONASE) 50 MCG/ACT nasal spray; Place 2 sprays into both nostrils daily.  Dispense: 16 g; Refill: 6  Advised patient on supportive measures:  Get rest, drink plenty of fluids, and use tylenol as needed for pain. Follow up if fever >101, if symptoms worsen or if symptoms are not improved in 3-4 days with Augmentin. Written return precautions provided. Patient  verbalizes understanding and agrees with plan.  Delano Metz, FNP-C

## 2017-07-29 NOTE — Patient Instructions (Signed)
Please take medication as directed with food and follow up with your provider if symptoms do not improve with treatment, worsen, or you develop new symptoms particularly fever >101 or shortness of breath.  Continue loratadine and add flonase for symptoms.  If lymph node remains enlarged following treatment and resolution of symptoms, please follow up with Dr. Caryl Bis for further evaluation.  Lymphadenopathy Lymphadenopathy refers to swollen or enlarged lymph glands, also called lymph nodes. Lymph glands are part of your body's defense (immune) system, which protects the body from infections, germs, and diseases. Lymph glands are found in many locations in your body, including the neck, underarm, and groin. Many things can cause lymph glands to become enlarged. When your immune system responds to germs, such as viruses or bacteria, infection-fighting cells and fluid build up. This causes the glands to grow in size. Usually, this is not something to worry about. The swelling and any soreness often go away without treatment. However, swollen lymph glands can also be caused by a number of diseases. Your health care provider may do various tests to help determine the cause. If the cause of your swollen lymph glands cannot be found, it is important to monitor your condition to make sure the swelling goes away. Follow these instructions at home: Watch your condition for any changes. The following actions may help to lessen any discomfort you are feeling:  Get plenty of rest.  Take medicines only as directed by your health care provider. Your health care provider may recommend over-the-counter medicines for pain.  Apply moist heat compresses to the site of swollen lymph nodes as directed by your health care provider. This can help reduce any pain.  Check your lymph nodes daily for any changes.  Keep all follow-up visits as directed by your health care provider. This is important.  Contact a health  care provider if:  Your lymph nodes are still swollen after 2 weeks.  Your swelling increases or spreads to other areas.  Your lymph nodes are hard, seem fixed to the skin, or are growing rapidly.  Your skin over the lymph nodes is red and inflamed.  You have a fever.  You have chills.  You have fatigue.  You develop a sore throat.  You have abdominal pain.  You have weight loss.  You have night sweats. Get help right away if:  You notice fluid leaking from the area of the enlarged lymph node.  You have severe pain in any area of your body.  You have chest pain.  You have shortness of breath. This information is not intended to replace advice given to you by your health care provider. Make sure you discuss any questions you have with your health care provider. Document Released: 12/26/2007 Document Revised: 08/24/2015 Document Reviewed: 10/21/2013 Elsevier Interactive Patient Education  2018 [employer].  Otitis Media, Adult Otitis media is redness, soreness, and puffiness (swelling) in the space just behind your eardrum (middle ear). It may be caused by allergies or infection. It often happens along with a cold. Follow these instructions at home:  Take your medicine as told. Finish it even if you start to feel better.  Only take over-the-counter or prescription medicines for pain, discomfort, or fever as told by your doctor.  Follow up with your doctor as told. Contact a doctor if:  You have otitis media only in one ear, or bleeding from your nose, or both.  You notice a lump on your neck.  You are not getting  better in 3-5 days.  You feel worse instead of better. Get help right away if:  You have pain that is not helped with medicine.  You have puffiness, redness, or pain around your ear.  You get a stiff neck.  You cannot move part of your face (paralysis).  You notice that the bone behind your ear hurts when you touch it. This information is not  intended to replace advice given to you by your health care provider. Make sure you discuss any questions you have with your health care provider. Document Released: 09/04/2007 Document Revised: 08/24/2015 Document Reviewed: 10/13/2012 Elsevier Interactive Patient Education  2017 [employer].

## 2017-08-07 ENCOUNTER — Ambulatory Visit (INDEPENDENT_AMBULATORY_CARE_PROVIDER_SITE_OTHER): Payer: PPO | Admitting: Neurology

## 2017-08-07 ENCOUNTER — Encounter: Payer: Self-pay | Admitting: Neurology

## 2017-08-07 VITALS — BP 127/82 | HR 73 | Ht 65.0 in | Wt 215.5 lb

## 2017-08-07 DIAGNOSIS — IMO0002 Reserved for concepts with insufficient information to code with codable children: Secondary | ICD-10-CM

## 2017-08-07 DIAGNOSIS — R4020 Unspecified coma: Secondary | ICD-10-CM | POA: Diagnosis not present

## 2017-08-07 MED ORDER — LAMOTRIGINE ER 300 MG PO TB24: 300.0000 mg | ORAL_TABLET | Freq: Every day | ORAL | 4 refills | Status: DC

## 2017-08-07 NOTE — Progress Notes (Signed)
PATIENT: Denise Davis DOB: [birth year]  Chief Complaint  Patient presents with  . Seizure-like activity    Her symptoms have improved since starting Lamictal.  She will still have occasional tingling around her lips.  She would like to review her MRI and EEG results.  She is taking gabapentin but cannot remember the reason it was started.  She would like to know if you felt it was safe to discontinue the medication.     HISTORICAL  Denise Davis is a 52 year old female, seen in refer by her primary care doctor Leone Haven, for evaluation of possible seizure, initial evaluation was on May 08, 2017.  She had a past medical history of hypertension, hyperlipidemia, renal cell carcinoma, s/p left nephrectomy in 2009, it was a incidental finding, does not need chemoradiation therapy.  She began to have seizures since 2015, she described as generalized tonic-clonic seizure, when she came around, she was confused, she had a total of 4-5 episode in the first 12 months, was put on Keppra 500 twice daily, she has no recurrent seizure activity, she stopped the [place] since 2018.  Then she began to have recurrent spells of sudden onset energy surge in her brain, transient dizziness, vertigo, mild nausea, lasting for a few seconds, she can multiple recurrent episode in the day, no loss of consciousness,  This is in the setting of extreme stress, her husband passed away in summer 2018.  She also experienced similar transient recurrent episode prior to her seizure onset in 2015,  She is also on polypharmacy treatment for her depression  UPDATE Aug 07 2017: She was started on titrating dose of lamotrigine since her initial visit in February 2019 for probable seizure, did have recurrent spells at the end of March 2019 while she was sitting at the edge of the bathtub talking with her sister, she felt her mouth was tingling, lightheaded, next thing she remembers she was in the bathtub, her  sister was standing over her, she was confused, there was no body shaking seizure-like activity described.  This is the first episode over the past 1 year that she actually loss of consciousness, but over the years, she has frequent spells of numbness tingling around her mouth, sometimes on a daily basis, with starting lamotrigine 100 mg twice a day, she has much less spells, only occasionally,shorter lasting,   EEG was normal in February 2019  MRI of brain with without contrast on May 14, 2017 was normal  History of left kidney cancer, status post left nephrectomy,  MRI of abdomen pelvic in March 2019 9 mm subcapsular lesion faint T2 hyperintensity, without T1 hyperintensity, could reflect a benign hemangioma,  Laboratory evaluation April 2019, showed normal CBC, CMP, negative RPR,  REVIEW OF SYSTEMS: Full 14 system review of systems performed and notable only for   eye itching, swollen lymph node  ALLERGIES: Allergies  Allergen Reactions  . Lisinopril Cough    HOME MEDICATIONS: Current Outpatient Medications  Medication Sig Dispense Refill  . albuterol (VENTOLIN HFA) 108 (90 Base) MCG/ACT inhaler Inhale 2 puffs into the lungs every 6 (six) hours as needed for wheezing or shortness of breath.    Marland Kitchen amoxicillin-clavulanate (AUGMENTIN) 875-125 MG tablet Take 1 tablet by mouth. For dental    . aspirin 81 MG chewable tablet Chew 81 mg by mouth daily.    . butalbital-acetaminophen-caffeine (FIORICET, ESGIC) 50-325-40 MG tablet Take 1 tablet by mouth every 4 (four) hours as needed for headache. [place]  tablet 0  . calcium-vitamin D 250-100 MG-UNIT tablet Take 1 tablet by mouth 2 (two) times daily. Reported on 06/16/2015 180 tablet 3  . carvedilol (COREG) 12.5 MG tablet Take 1 tablet (12.5 mg total) by mouth 2 (two) times daily with a meal. 60 tablet 6  . Cholecalciferol (VITAMIN D-3) 1000 units CAPS Take 1 capsule (1,000 Units total) by mouth daily. Reported on 06/16/2015 90 capsule 3  .  clotrimazole-betamethasone (LOTRISONE) cream Apply 1 application topically 2 (two) times daily. 30 g 0  . dexlansoprazole (DEXILANT) 60 MG capsule Take 1 capsule (60 mg total) by mouth daily. 30 capsule 6  . fluticasone (FLONASE) 50 MCG/ACT nasal spray Place 2 sprays into both nostrils daily. 16 g 6  . gabapentin (NEURONTIN) 300 MG capsule Take 600 mg (2 tablets) by mouth daily in the morning and take 1200 mg (4 tablets) by mouth daily at night 240 capsule 3  . hydrOXYzine (VISTARIL) 50 MG capsule Take 1 capsule (50 mg total) by mouth 3 (three) times daily as needed for itching. 30 capsule 0  . lamoTRIgine (LAMICTAL) 100 MG tablet Take 1 tablet (100 mg total) by mouth daily. 60 tablet 11  . loratadine (CLARITIN) 10 MG tablet Take 1 tablet (10 mg total) by mouth daily. 90 tablet 3  . Melatonin 3 MG TABS Take 9 mg by mouth.     . Multiple Vitamins-Minerals (MULTIVITAMIN WITH MINERALS) tablet Take 1 tablet by mouth daily. Reported on 06/16/2015    . nitroGLYCERIN (NITROSTAT) 0.4 MG SL tablet Place 1 tablet (0.4 mg total) under the tongue every 5 (five) minutes as needed for chest pain. (may repeat every 5 minutes but seek medical help if pain persists after 3 tablets) 25 tablet 0  . ondansetron (ZOFRAN-ODT) 4 MG disintegrating tablet DISSOLVE 1 TABLET IN MOUTH EVERY 8 HOURS AS NEEDED FOR NAUSEA OR VOMITING 20 tablet 0  . oxyCODONE (OXY IR/ROXICODONE) 5 MG immediate release tablet Take 1 tablet (5 mg total) by mouth every 6 (six) hours as needed for moderate pain. 20 tablet 0  . promethazine (PHENERGAN) 25 MG tablet TAKE 1 TABLET BY MOUTH EVERY 8 HOURS AS NEEDED FOR NAUSEA OR VOMITING 30 tablet 2  . telmisartan (MICARDIS) 20 MG tablet Take 1 tablet (20 mg total) by mouth daily. 90 tablet 3  . triamcinolone cream (KENALOG) 0.1 % APPLY TOPICALLY TWO TIMES DAILY 30 g 0  . vitamin B-12 (CYANOCOBALAMIN) 500 MCG tablet Take by mouth.     No current facility-administered medications for this visit.     PAST  MEDICAL HISTORY: Past Medical History:  Diagnosis Date  . Allergic rhinitis   . Anxiety   . Depression   . Diabetes mellitus    taken off meds in beginning fo 2018 dueto weight loss   . DVT of leg (deep venous thrombosis) ([place]) 12/2007   left leg, given warfarin for 6 mos  . GERD (gastroesophageal reflux disease)   . H/O hiatal hernia   . Hyperlipidemia   . Hypertension   . Morbid obesity ([place])   . Pericarditis    Diagnosed/treated at [facility]; per patient 4 episodes per cardiologist Dr. Edwin Dada persistant chest pain unclear if pericarditis verusus neuropsychogenic   . Renal cell cancer ([place]) 2009   S/p nephrectomy (left)  . Seizure disorder ([place])    (04/08/17)- None in over 1 yr, no meds.    PAST SURGICAL HISTORY: Past Surgical History:  Procedure Laterality Date  . CHOLECYSTECTOMY N/A 05/02/2017   Procedure:  LAPAROSCOPIC CHOLECYSTECTOMY WITH INTRAOPERATIVE CHOLANGIOGRAM;  Surgeon: Johnathan Hausen, MD;  Location: [facility];  Service: General;  Laterality: N/A;  . ENDOMETRIAL ABLATION  2007  . ESOPHAGEAL MANOMETRY N/A 05/21/2016   Procedure: ESOPHAGEAL MANOMETRY (EM);  Surgeon: Lucilla Lame, MD;  Location: [facility] ENDOSCOPY;  Service: Endoscopy;  Laterality: N/A;  . ESOPHAGOGASTRODUODENOSCOPY (EGD) WITH PROPOFOL N/A 05/14/2016   Procedure: ESOPHAGOGASTRODUODENOSCOPY (EGD) WITH PROPOFOL;  Surgeon: Lucilla Lame, MD;  Location: [facility] ENDOSCOPY;  Service: Endoscopy;  Laterality: N/A;  . ESOPHAGOGASTRODUODENOSCOPY (EGD) WITH PROPOFOL N/A 04/10/2017   Procedure: ESOPHAGOGASTRODUODENOSCOPY (EGD) WITH PROPOFOL;  Surgeon: Lucilla Lame, MD;  Location: [place];  Service: Endoscopy;  Laterality: N/A;  . INSERTION OF MESH  2014  . LEFT HEART CATHETERIZATION WITH CORONARY ANGIOGRAM N/A 08/30/2013   Procedure: LEFT HEART CATHETERIZATION WITH CORONARY ANGIOGRAM;  Surgeon: Troy Sine, MD;  Location: [facility] CATH LAB;  Service: Cardiovascular;  Laterality: N/A;  . NEPHRECTOMY    . REPLACEMENT TOTAL KNEE  BILATERAL Bilateral 2018   2/18, 5/18 [facility]  . ROUX-EN-Y GASTRIC BYPASS  04/27/2015   [facility]  . TUBAL LIGATION    . UMBILICAL HERNIA REPAIR      FAMILY HISTORY: Family History  Problem Relation Age of Onset  . Hypertension Mother   . Breast cancer Mother 38  . Arthritis Mother   . Heart disease Paternal Grandmother   . Alcoholism Father   . Hyperlipidemia Unknown        Parent  . Mental illness Unknown        Parent  . Diabetes Unknown        Parent    SOCIAL HISTORY:  Social History   Socioeconomic History  . Marital status: Widowed    Spouse name: Not on file  . Number of children: 2  . Years of education: 48  . Highest education level: High school graduate  Occupational History  . Occupation: Disabled  Social Needs  . Financial resource strain: Not on file  . Food insecurity:    Worry: Not on file    Inability: Not on file  . Transportation needs:    Medical: Not on file    Non-medical: Not on file  Tobacco Use  . Smoking status: Former Smoker    Types: Cigarettes    Last attempt to quit: 1984    Years since quitting: 35.3  . Smokeless tobacco: Never Used  Substance and Sexual Activity  . Alcohol use: No    Frequency: Never  . Drug use: No  . Sexual activity: Not Currently  Lifestyle  . Physical activity:    Days per week: Not on file    Minutes per session: Not on file  . Stress: Not on file  Relationships  . Social connections:    Talks on phone: Not on file    Gets together: Not on file    Attends religious service: Not on file    Active member of club or organization: Not on file    Attends meetings of clubs or organizations: Not on file    Relationship status: Not on file  . Intimate partner violence:    Fear of current or ex partner: Not on file    Emotionally abused: Not on file    Physically abused: Not on file    Forced sexual activity: Not on file  Other Topics Concern  . 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   Right-handed.   One cup caffeine per day.           PHYSICAL EXAM   Vitals:   08/07/17 1108  BP: 127/82  Pulse: 73  Weight: 215 lb 8 oz (97.8 kg)  Height: 5\' 5"  (1.651 m)    Not recorded      Body mass index is 35.86 kg/m.  PHYSICAL EXAMNIATION:  Gen: NAD, conversant, well nourised, obese, well groomed                     Cardiovascular: Regular rate rhythm, no peripheral edema, warm, nontender. Eyes: Conjunctivae clear without exudates or hemorrhage Neck: Supple, no carotid bruits. Pulmonary: Clear to auscultation bilaterally   NEUROLOGICAL EXAM:  MENTAL STATUS: Speech:    Speech is normal; fluent and spontaneous with normal comprehension.  Cognition:     Orientation to time, place and person     Normal recent and remote memory     Normal Attention span and concentration     Normal Language, naming, repeating,spontaneous speech     Fund of knowledge   CRANIAL NERVES: CN II: Visual fields are full to confrontation. Fundoscopic exam is normal with sharp discs and no vascular changes. Pupils are round equal and briskly reactive to light. CN III, IV, VI: extraocular movement are normal. No ptosis. CN V: Facial sensation is intact to pinprick in all 3 divisions bilaterally. Corneal responses are intact.  CN VII: Face is symmetric with normal eye closure and smile. CN VIII: Hearing is normal to rubbing fingers CN IX, X: Palate elevates symmetrically. Phonation is normal. CN XI: Head turning and shoulder shrug are intact CN XII: Tongue is midline with normal movements and no atrophy.  MOTOR: There is no pronator drift of out-stretched arms. Muscle bulk and tone are normal. Muscle strength is normal.  REFLEXES: Reflexes are 2+ and symmetric at the biceps, triceps, knees, and ankles. Plantar responses are flexor.  SENSORY: Intact to light  touch, pinprick, positional sensation and vibratory sensation are intact in fingers and toes.  COORDINATION: Rapid alternating movements and fine finger movements are intact. There is no dysmetria on finger-to-nose and heel-knee-shin.    GAIT/STANCE: Posture is normal. Gait is steady with normal steps, base, arm swing, and turning. Heel and toe walking are normal. Tandem gait is normal.  [place] is absent.   DIAGNOSTIC DATA (LABS, IMAGING, TESTING) - I reviewed patient records, labs, notes, testing and imaging myself where available.   ASSESSMENT AND PLAN  IRELYN PERFECTO is a 52 y.o. female   Probable complex partial seizure with secondary generalization  Often preceded by numbness tingling around her mouth,  Most recent passing out spell was in March 2019  No driving him to seizure-free for 6 months  Increase lamotrigine to ER 300mg  qhs, she continue complains of frequent spells of numbness tingling around her mouth without loss of consciousness  MRI of brain, and the EEG was normal  Stop gabapentin  Marcial Pacas, M.D. Ph.D.  [facility] [address] [place], [place] [address] Ph: [phone] Fax: [phone]  CC: Leone Haven, MD

## 2017-08-11 ENCOUNTER — Ambulatory Visit: Payer: PPO | Admitting: Podiatry

## 2017-09-11 ENCOUNTER — Ambulatory Visit: Payer: Self-pay | Admitting: *Deleted

## 2017-09-11 NOTE — Telephone Encounter (Signed)
Patient is calling to report she is having cough that is causing her chest pain and dizziness. Patient also states she has a fever. She has a history of gastric bypass and she is having a hard time eating with this illness. Patient scheduled for appointment tomorrow- she is advised to continue treating her temperature with OTC tylenol, increase fluids, and check with pharmacy for best cough medication to use with her BP medication. If she should have changes or get worse over night- she is to go to ED?[facility] Reason for Disposition . [1] Fever returns after gone for over 24 hours AND [2] symptoms worse or not improved  Answer Assessment - Initial Assessment Questions 1. ONSET: "When did the cough begin?"      2 days ago 2. SEVERITY: "How bad is the cough today?"      Cough is making her chest hurt and makes her dizzy 3. RESPIRATORY DISTRESS: "Describe your breathing."      Patient gets SOB 4. FEVER: "Do you have a fever?" If so, ask: "What is your temperature, how was it measured, and when did it start?"     Last night 101.8- oral thermometer 100.4 today 5. SPUTUM: "Describe the color of your sputum" (clear, white, yellow, green)     Tan/greenish- really hard to get out 6. HEMOPTYSIS: "Are you coughing up any blood?" If so ask: "How much?" (flecks, streaks, tablespoons, etc.)     no 7. CARDIAC HISTORY: "Do you have any history of heart disease?" (e.g., heart attack, congestive heart failure)      no 8. LUNG HISTORY: "Do you have any history of lung disease?"  (e.g., pulmonary embolus, asthma, emphysema)     no 9. PE RISK FACTORS: "Do you have a history of blood clots?" (or: recent major surgery, recent prolonged travel, bedridden )     2009- hx- DVT in leg 10. OTHER SYMPTOMS: "Do you have any other symptoms?" (e.g., runny nose, wheezing, chest pain)       Chest pain- patient has has gastric bypass- she almost dumping syndrome- lately she feels full with anything she eats 11. PREGNANCY: "Is  there any chance you are pregnant?" "When was your last menstrual period?"       No- LMP- 2010-ablation 12. TRAVEL: "Have you traveled out of the country in the last month?" (e.g., travel history, exposures)       n/a  Protocols used: [place]

## 2017-09-11 NOTE — Telephone Encounter (Signed)
Patient triaged by [facility] and has been scheduled for an appt on 09-12-17

## 2017-09-12 ENCOUNTER — Other Ambulatory Visit (HOSPITAL_COMMUNITY)
Admission: RE | Admit: 2017-09-12 | Discharge: 2017-09-12 | Disposition: A | Discharge status: Home / Self Care | Payer: PPO | Source: Ambulatory Visit | Attending: Internal Medicine | Admitting: Internal Medicine

## 2017-09-12 ENCOUNTER — Ambulatory Visit (INDEPENDENT_AMBULATORY_CARE_PROVIDER_SITE_OTHER): Payer: PPO

## 2017-09-12 ENCOUNTER — Encounter: Payer: Self-pay | Admitting: Internal Medicine

## 2017-09-12 ENCOUNTER — Ambulatory Visit (INDEPENDENT_AMBULATORY_CARE_PROVIDER_SITE_OTHER): Payer: PPO | Admitting: Internal Medicine

## 2017-09-12 VITALS — BP 126/82 | HR 72 | Temp 98.7°F | Ht 65.0 in | Wt 214.8 lb

## 2017-09-12 DIAGNOSIS — Z9884 Bariatric surgery status: Secondary | ICD-10-CM | POA: Diagnosis not present

## 2017-09-12 DIAGNOSIS — R05 Cough: Secondary | ICD-10-CM

## 2017-09-12 DIAGNOSIS — E538 Deficiency of other specified B group vitamins: Secondary | ICD-10-CM

## 2017-09-12 DIAGNOSIS — R002 Palpitations: Secondary | ICD-10-CM | POA: Diagnosis not present

## 2017-09-12 DIAGNOSIS — R059 Cough, unspecified: Secondary | ICD-10-CM

## 2017-09-12 DIAGNOSIS — R112 Nausea with vomiting, unspecified: Secondary | ICD-10-CM

## 2017-09-12 DIAGNOSIS — E559 Vitamin D deficiency, unspecified: Secondary | ICD-10-CM

## 2017-09-12 DIAGNOSIS — R5383 Other fatigue: Secondary | ICD-10-CM | POA: Diagnosis not present

## 2017-09-12 DIAGNOSIS — Z113 Encounter for screening for infections with a predominantly sexual mode of transmission: Secondary | ICD-10-CM | POA: Diagnosis not present

## 2017-09-12 DIAGNOSIS — R7303 Prediabetes: Secondary | ICD-10-CM | POA: Diagnosis not present

## 2017-09-12 DIAGNOSIS — R0602 Shortness of breath: Secondary | ICD-10-CM | POA: Diagnosis not present

## 2017-09-12 DIAGNOSIS — R0789 Other chest pain: Secondary | ICD-10-CM

## 2017-09-12 DIAGNOSIS — I10 Essential (primary) hypertension: Secondary | ICD-10-CM

## 2017-09-12 DIAGNOSIS — R079 Chest pain, unspecified: Secondary | ICD-10-CM

## 2017-09-12 LAB — TROPONIN I: Troponin I: <0.01 ng/mL (ref <=0.0)

## 2017-09-12 LAB — GLUCOSE, POCT (MANUAL RESULT ENTRY): POC Glucose: 144 mg/dl — AB (ref 70–99)

## 2017-09-12 LAB — POCT GLYCOSYLATED HEMOGLOBIN (HGB A1C): Hemoglobin A1C: 5.8 % — AB (ref 4.0–5.6)

## 2017-09-12 NOTE — Patient Instructions (Addendum)
Please f/u with Dr. Margart Sickles call and schedule appt asap If worsening go to the ED  F/u in 1-2 weeks with PCP Use your albuterol inhaler to see if this helps  Use zofran as needed for nausea    Nonspecific Chest Pain Chest pain can be caused by many different conditions. There is always a chance that your pain could be related to something serious, such as a heart attack or a blood clot in your lungs. Chest pain can also be caused by conditions that are not life-threatening. If you have chest pain, it is very important to follow up with your health care provider. What are the causes? Causes of this condition include:  Heartburn.  Pneumonia or bronchitis.  Anxiety or stress.  Inflammation around your heart (pericarditis) or lung (pleuritis or pleurisy).  A blood clot in your lung.  A collapsed lung (pneumothorax). This can develop suddenly on its own (spontaneous pneumothorax) or from trauma to the chest.  Shingles infection (varicella-zoster virus).  Heart attack.  Damage to the bones, muscles, and cartilage that make up your chest wall. This can include: ? Bruised bones due to injury. ? Strained muscles or cartilage due to frequent or repeated coughing or overwork. ? Fracture to one or more ribs. ? Sore cartilage due to inflammation (costochondritis).  What increases the risk? Risk factors for this condition may include:  Activities that increase your risk for trauma or injury to your chest.  Respiratory infections or conditions that cause frequent coughing.  Medical conditions or overeating that can cause heartburn.  Heart disease or family history of heart disease.  Conditions or health behaviors that increase your risk of developing a blood clot.  Having had chicken pox (varicella zoster).  What are the signs or symptoms? Chest pain can feel like:  Burning or tingling on the surface of your chest or deep in your chest.  Crushing, pressure, aching, or squeezing  pain.  Dull or sharp pain that is worse when you move, cough, or take a deep breath.  Pain that is also felt in your back, neck, shoulder, or arm, or pain that spreads to any of these areas.  Your chest pain may come and go, or it may stay constant. How is this diagnosed? Lab tests or other studies may be needed to find the cause of your pain. Your health care provider may have you take a test called an ECG (electrocardiogram). An ECG records your heartbeat patterns at the time the test is performed. You may also have other tests, such as:  Transthoracic echocardiogram (TTE). In this test, sound waves are used to create a picture of the heart structures and to look at how blood flows through your heart.  Transesophageal echocardiogram (TEE).This is a more advanced imaging test that takes images from inside your body. It allows your health care provider to see your heart in finer detail.  Cardiac monitoring. This allows your health care provider to monitor your heart rate and rhythm in real time.  Holter monitor. This is a portable device that records your heartbeat and can help to diagnose abnormal heartbeats. It allows your health care provider to track your heart activity for several days, if needed.  Stress tests. These can be done through exercise or by taking medicine that makes your heart beat more quickly.  Blood tests.  Other imaging tests.  How is this treated? Treatment depends on what is causing your chest pain. Treatment may include:  Medicines. These may include: ?  Acid blockers for heartburn. ? Anti-inflammatory medicine. ? Pain medicine for inflammatory conditions. ? Antibiotic medicine, if an infection is present. ? Medicines to dissolve blood clots. ? Medicines to treat coronary artery disease (CAD).  Supportive care for conditions that do not require medicines. This may include: ? Resting. ? Applying heat or cold packs to injured areas. ? Limiting activities  until pain decreases.  Follow these instructions at home: Medicines  If you were prescribed an antibiotic, take it as told by your health care provider. Do not stop taking the antibiotic even if you start to feel better.  Take over-the-counter and prescription medicines only as told by your health care provider. Lifestyle  Do not use any products that contain nicotine or tobacco, such as cigarettes and e-cigarettes. If you need help quitting, ask your health care provider.  Do not drink alcohol.  Make lifestyle changes as directed by your health care provider. These may include: ? Getting regular exercise. Ask your health care provider to suggest some activities that are safe for you. ? Eating a heart-healthy diet. A registered dietitian can help you to learn healthy eating options. ? Maintaining a healthy weight. ? Managing diabetes, if necessary. ? Reducing stress, such as with yoga or relaxation techniques. General instructions  Avoid any activities that bring on chest pain.  If heartburn is the cause for your chest pain, raise (elevate) the head of your bed about 6 inches (15 cm) by putting blocks under the legs. Sleeping with more pillows does not effectively relieve heartburn because it only changes the position of your head.  Keep all follow-up visits as told by your health care provider. This is important. This includes any further testing if your chest pain does not go away. Contact a health care provider if:  Your chest pain does not go away.  You have a rash with blisters on your chest.  You have a fever.  You have chills. Get help right away if:  Your chest pain is worse.  You have a cough that gets worse, or you cough up blood.  You have severe pain in your abdomen.  You have severe weakness.  You faint.  You have sudden, unexplained chest discomfort.  You have sudden, unexplained discomfort in your arms, back, neck, or jaw.  You have shortness of  breath at any time.  You suddenly start to sweat, or your skin gets clammy.  You feel nauseous or you vomit.  You suddenly feel light-headed or dizzy.  Your heart begins to beat quickly, or it feels like it is skipping beats. These symptoms may represent a serious problem that is an emergency. Do not wait to see if the symptoms will go away. Get medical help right away. Call your local emergency services (911 in the [place] Do not drive yourself to the hospital. This information is not intended to replace advice given to you by your health care provider. Make sure you discuss any questions you have with your health care provider. Document Released: 12/26/2004 Document Revised: 12/11/2015 Document Reviewed: 12/11/2015 Elsevier Interactive Patient Education  2017 [employer].

## 2017-09-12 NOTE — Progress Notes (Addendum)
Chief Complaint  Patient presents with  . Fever  . Cough   Acute visit multiple complaints  1. She c/o fever to 101.5 and cough at night 2-3 days ago. She does not smoke though exposed to 2nd hand smoke no h/o asthma and has seasonal allergies. She also reports sob and the urge to cough and hurts to cough and feels chest tightness. No sick contacts.  -reviewed CTA chest 12/20/17 mild CAD  2. She reports she is lightheaded with food and reduce po intake and she is not hungry. She is s/p gastric bypass with h/o dumping syndrome but this does not feel like this. She reports she has chronic nausea w/o vomiting no drug used.  She also reports she has had diarrhea.   -reviewed EGD 04/10/17 Z line irregular no biopsies taken   3. Palpitations experienced yesterday with eating and lasting up to 10 minutes after food intake.  4. H/o OSA not using CPAP 5. DM 2 s/p gastric bypass 04/27/15 off meds 6. HTN controlled on coreg 12.5 mg bid, micardis 20 mg qd   Review of Systems  Constitutional: Positive for fever and malaise/fatigue. Negative for weight loss.  HENT: Negative for hearing loss.   Eyes: Negative for blurred vision.  Respiratory: Positive for cough and shortness of breath.   Cardiovascular: Positive for chest pain and palpitations.  Gastrointestinal: Positive for diarrhea, heartburn and nausea. Negative for vomiting.       +reduce appetite   Psychiatric/Behavioral: Negative for substance abuse.   Past Medical History:  Diagnosis Date  . Allergic rhinitis   . Anxiety   . Depression   . Diabetes mellitus    taken off meds in beginning fo 2018 dueto weight loss   . DVT of leg (deep venous thrombosis) ([place]) 12/2007   left leg, given warfarin for 6 mos  . GERD (gastroesophageal reflux disease)   . H/O hiatal hernia   . Hyperlipidemia   . Hypertension   . Morbid obesity ([place])   . Pericarditis    Diagnosed/treated at [facility]; per patient 4 episodes per cardiologist Dr. Edwin Dada persistant  chest pain unclear if pericarditis verusus neuropsychogenic   . Renal cell cancer ([place]) 2009   S/p nephrectomy (left)  . Seizure disorder ([place])    (04/08/17)- None in over 1 yr, no meds.   Past Surgical History:  Procedure Laterality Date  . CHOLECYSTECTOMY N/A 05/02/2017   Procedure: LAPAROSCOPIC CHOLECYSTECTOMY WITH INTRAOPERATIVE CHOLANGIOGRAM;  Surgeon: Johnathan Hausen, MD;  Location: [facility];  Service: General;  Laterality: N/A;  . ENDOMETRIAL ABLATION  2007  . ESOPHAGEAL MANOMETRY N/A 05/21/2016   Procedure: ESOPHAGEAL MANOMETRY (EM);  Surgeon: Lucilla Lame, MD;  Location: [facility] ENDOSCOPY;  Service: Endoscopy;  Laterality: N/A;  . ESOPHAGOGASTRODUODENOSCOPY (EGD) WITH PROPOFOL N/A 05/14/2016   Procedure: ESOPHAGOGASTRODUODENOSCOPY (EGD) WITH PROPOFOL;  Surgeon: Lucilla Lame, MD;  Location: [facility] ENDOSCOPY;  Service: Endoscopy;  Laterality: N/A;  . ESOPHAGOGASTRODUODENOSCOPY (EGD) WITH PROPOFOL N/A 04/10/2017   Procedure: ESOPHAGOGASTRODUODENOSCOPY (EGD) WITH PROPOFOL;  Surgeon: Lucilla Lame, MD;  Location: [place];  Service: Endoscopy;  Laterality: N/A;  . INSERTION OF MESH  2014  . LEFT HEART CATHETERIZATION WITH CORONARY ANGIOGRAM N/A 08/30/2013   Procedure: LEFT HEART CATHETERIZATION WITH CORONARY ANGIOGRAM;  Surgeon: Troy Sine, MD;  Location: [facility] CATH LAB;  Service: Cardiovascular;  Laterality: N/A;  . NEPHRECTOMY    . REPLACEMENT TOTAL KNEE BILATERAL Bilateral 2018   2/18, 5/18 [facility]  . ROUX-EN-Y GASTRIC BYPASS  04/27/2015   [facility]  .  TUBAL LIGATION    . UMBILICAL HERNIA REPAIR     Family History  Problem Relation Age of Onset  . Hypertension Mother   . Breast cancer Mother 39  . Arthritis Mother   . Heart disease Paternal Grandmother   . Alcoholism Father   . Hyperlipidemia Unknown        Parent  . Mental illness Unknown        Parent  . Diabetes Unknown        Parent   Social History   Socioeconomic History  . Marital status: Widowed    Spouse name: Not on file   . Number of children: 2  . Years of education: 40  . Highest education level: High school graduate  Occupational History  . Occupation: Disabled  Social Needs  . Financial resource strain: Not on file  . Food insecurity:    Worry: Not on file    Inability: Not on file  . Transportation needs:    Medical: Not on file    Non-medical: Not on file  Tobacco Use  . Smoking status: Former Smoker    Types: Cigarettes    Last attempt to quit: 1984    Years since quitting: 35.4  . Smokeless tobacco: Never Used  Substance and Sexual Activity  . Alcohol use: No    Frequency: Never  . Drug use: No  . Sexual activity: Not Currently  Lifestyle  . Physical activity:    Days per week: Not on file    Minutes per session: Not on file  . Stress: Not on file  Relationships  . Social connections:    Talks on phone: Not on file    Gets together: Not on file    Attends religious service: Not on file    Active member of club or organization: Not on file    Attends meetings of clubs or organizations: Not on file    Relationship status: Not on file  . Intimate partner violence:    Fear of current or ex partner: Not on file    Emotionally abused: Not on file    Physically abused: Not on file    Forced sexual activity: Not on file  Other Topics Concern  . 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   Right-handed.   One cup caffeine per day.         No outpatient medications have been marked as taking for the 09/12/17 encounter (Office Visit) with McLean-Scocuzza, Nino Glow, MD.   Allergies  Allergen Reactions  . Lisinopril Cough   Recent Results (from the past 2160 hour(s))  Comprehensive metabolic panel     Status: Abnormal   Collection Time: 06/24/17  3:21 AM  Result Value Ref Range   Sodium 139 135 - 145 mmol/L   Potassium 3.6 3.5 - 5.1 mmol/L    Chloride 107 101 - 111 mmol/L   CO2 24 22 - 32 mmol/L   Glucose, Bld 100 (H) 65 - 99 mg/dL   BUN 10 6 - 20 mg/dL   Creatinine, Ser 0.81 0.44 - 1.00 mg/dL   Calcium 8.4 (L) 8.9 - 10.3 mg/dL   Total Protein 6.5 6.5 - 8.1 g/dL   Albumin 3.5 3.5 - 5.0 g/dL   AST 20 15 - 41 U/L   ALT 19 14 - 54  U/L   Alkaline Phosphatase 81 38 - 126 U/L   Total Bilirubin 0.6 0.3 - 1.2 mg/dL   GFR calc non Af Amer >60 >60 mL/min   GFR calc Af Amer >60 >60 mL/min    Comment: (NOTE) The eGFR has been calculated using the CKD EPI equation. This calculation has not been validated in all clinical situations. eGFR's persistently <60 mL/min signify possible Chronic Kidney Disease.    Anion gap 8 5 - 15    Comment: Performed at [facility], [address] [place], [place] [address]  CBC with Differential     Status: Abnormal   Collection Time: 06/24/17  3:21 AM  Result Value Ref Range   WBC 5.2 3.6 - 11.0 K/uL   RBC 4.38 3.80 - 5.20 MIL/uL   Hemoglobin 14.1 12.0 - 16.0 g/dL   HCT 42.1 35.0 - 47.0 %   MCV 96.2 80.0 - 100.0 fL   MCH 32.1 26.0 - 34.0 pg   MCHC 33.4 32.0 - 36.0 g/dL   RDW 14.7 (H) 11.5 - 14.5 %   Platelets 186 150 - 440 K/uL   Neutrophils Relative % 47 %   Neutro Abs 2.5 1.4 - 6.5 K/uL   Lymphocytes Relative 41 %   Lymphs Abs 2.2 1.0 - 3.6 K/uL   Monocytes Relative 8 %   Monocytes Absolute 0.4 0.2 - 0.9 K/uL   Eosinophils Relative 3 %   Eosinophils Absolute 0.1 0 - 0.7 K/uL   Basophils Relative 1 %   Basophils Absolute 0.0 0 - 0.1 K/uL    Comment: Performed at [facility], [address] [place], [place] [address]  RPR     Status: None   Collection Time: 06/24/17  3:21 AM  Result Value Ref Range   RPR Ser Ql Non Reactive Non Reactive    Comment: (NOTE) Performed At: [facility] [address] [place], [place] [id number] Rush Farmer MD [phone] Performed at [facility], [address] [place], [place] [address]   CBC      Status: Abnormal   Collection Time: 07/16/17  9:34 AM  Result Value Ref Range   WBC 3.3 (L) 4.0 - 10.5 K/uL   RBC 4.38 3.87 - 5.11 Mil/uL   Platelets 197.0 150.0 - 400.0 K/uL   Hemoglobin 14.1 12.0 - 15.0 g/dL   HCT 42.5 36.0 - 46.0 %   MCV 97.0 78.0 - 100.0 fl   MCHC 33.2 30.0 - 36.0 g/dL   RDW 14.2 11.5 - 15.5 %  Hepatic function panel     Status: None   Collection Time: 07/16/17  9:34 AM  Result Value Ref Range   Total Bilirubin 0.7 0.2 - 1.2 mg/dL   Bilirubin, Direct 0.1 0.0 - 0.3 mg/dL   Alkaline Phosphatase 71 39 - 117 U/L   AST 17 0 - 37 U/L   ALT 18 0 - 35 U/L   Total Protein 6.1 6.0 - 8.3 g/dL   Albumin 3.5 3.5 - 5.2 g/dL  Basic Metabolic Panel (BMET)     Status: Abnormal   Collection Time: 07/16/17  9:34 AM  Result Value Ref Range   Sodium 142 135 - 145 mEq/L   Potassium 4.6 3.5 - 5.1 mEq/L   Chloride 109 96 - 112 mEq/L   CO2 27 19 - 32 mEq/L   Glucose, Bld 114 (H) 70 - 99 mg/dL   BUN 11 6 - 23 mg/dL   Creatinine, Ser 0.77 0.40 - 1.20  mg/dL   Calcium 9.0 8.4 - 10.5 mg/dL   GFR 101.29 >60.00 mL/min  CBC with Differential/Platelet     Status: None   Collection Time: 07/28/17  3:53 PM  Result Value Ref Range   WBC 4.5 3.8 - 10.8 Thousand/uL   RBC 4.25 3.80 - 5.10 Million/uL   Hemoglobin 13.5 11.7 - 15.5 g/dL   HCT 39.4 35.0 - 45.0 %   MCV 92.7 80.0 - 100.0 fL   MCH 31.8 27.0 - 33.0 pg   MCHC 34.3 32.0 - 36.0 g/dL   RDW 12.9 11.0 - 15.0 %   Platelets 217 140 - 400 Thousand/uL   MPV 12.5 7.5 - 12.5 fL   Neutro Abs 1,571 1,500 - 7,800 cells/uL   Lymphs Abs 2,349 850 - 3,900 cells/uL   WBC mixed population 428 200 - 950 cells/uL   Eosinophils Absolute 122 15 - 500 cells/uL   Basophils Absolute 32 0 - 200 cells/uL   Neutrophils Relative % 34.9 %   Total Lymphocyte 52.2 %   Monocytes Relative 9.5 %   Eosinophils Relative 2.7 %   Basophils Relative 0.7 %   Objective  Body mass index is 35.74 kg/m. Wt Readings from Last 3 Encounters:  09/12/17 214 lb 12.8  oz (97.4 kg)  08/07/17 215 lb 8 oz (97.8 kg)  07/29/17 220 lb 8 oz (100 kg)   Temp Readings from Last 3 Encounters:  09/12/17 98.7 F (37.1 C) (Oral)  07/29/17 99.5 F (37.5 C) (Oral)  06/27/17 98.2 F (36.8 C) (Oral)   BP Readings from Last 3 Encounters:  09/12/17 126/82  08/07/17 127/82  07/29/17 130/84   Pulse Readings from Last 3 Encounters:  09/12/17 72  08/07/17 73  07/29/17 82    Physical Exam  Constitutional: She is oriented to person, place, and time. Vital signs are normal. She appears well-developed and well-nourished. She is cooperative.  HENT:  Head: Normocephalic and atraumatic.  Mouth/Throat: Oropharynx is clear and moist and mucous membranes are normal.  Eyes: Pupils are equal, round, and reactive to light. Conjunctivae are normal.  Cardiovascular: Normal rate, regular rhythm and normal heart sounds.  Pulmonary/Chest: Effort normal and breath sounds normal.  Neurological: She is alert and oriented to person, place, and time. Gait normal.  Skin: Skin is warm, dry and intact.  Psychiatric: She has a normal mood and affect. Her speech is normal and behavior is normal. Judgment and thought content normal. Cognition and memory are normal.  Nursing note and vitals reviewed.   Assessment   1. Subjective fever though she is afebrile today (w/u with CXR below), sob, cough, and chest tightness ? Etiology related to GERD, anxiety r/o cardiac etiology:    -CXR today normal. Troponin normal 09/12/17  -CTA chest 12/20/16 mild CAD otherwise negative  -EKG NSR, no acute ST/T changes, low voltage c/w pulm disease but CXR normal  -She had cardiology appt 07/18/17 Dr. Rockey Situ thought CP was atypical and cath 2015 nonobstructive disease and echo 12/01/15 EF 60-65% with grade 1 dd no further w/u rec.   >>ddx nonspecific sx's above sxs GERD, anxiety, r/o cardiac, r/o infection, psychosomatic   2. Reduced po intake, chronic nausea s/p gastric bypass with h/o complication dumping  syndrome, diarrhea  -EGD 04/10/17 irregular Z line no biopsies taken otherwise normal  -GB removed 05/02/17 chronic cholecystitis and cholelithiasis   3. Palpitations (per pt post prandial) -none noted today on exam. cbg 144, then 112 after crackers given in clinic  4. H/o DM 2  5. HTN controlled  6. STD check new partner   Plan   1.  CXR, trop, EKG, other labs today  She can try otc meds for cough I.e mucinex dm or robitussin dm   2.  Ordered gastric emptying study to w/u gastroparesis s/p bypass which can be complication  Rec pt also f/u with Dr. Humberto Leep [facility] who did her surgery 04/27/15 last office visit was 09/01/15 per care everywhere  Check labs today CMET, CBC, mag, b12, vitamin D, TSH, T4, UA -of note [facility] labd did not draw vitamin D lab as ordered consider check in future if cant add on  3. Check cbg to see if could be postprandial hypoglyceima and cbg was 144 then 112 and not hypoglycemic   4.  Check A1C  5.  Cont meds   6. STD check today  Of note [facility] lab did not draw hep BsAg and BsAb nor HCV labs as ordered consider check in future if cant add on  Provider: Dr. Olivia Mackie McLean-Scocuzza-Internal Medicine

## 2017-09-12 NOTE — Progress Notes (Signed)
Pre visit review using our clinic review tool, if applicable. No additional management support is needed unless otherwise documented below in the visit note. 

## 2017-09-13 LAB — CBC WITH DIFFERENTIAL/PLATELET
Basophils Absolute: 61 cells/uL (ref 0–200)
Basophils Relative: 1.7 %
Eosinophils Absolute: 133 cells/uL (ref 15–500)
Eosinophils Relative: 3.7 %
HCT: 41 % (ref 35.0–45.0)
Hemoglobin: 14.1 g/dL (ref 11.7–15.5)
Lymphs Abs: 1861 cells/uL (ref 850–3900)
MCH: 32 pg (ref 27.0–33.0)
MCHC: 34.4 g/dL (ref 32.0–36.0)
MCV: 93 fL (ref 80.0–100.0)
MPV: 13.2 fL — ABNORMAL HIGH (ref 7.5–12.5)
Monocytes Relative: 6.5 %
Neutro Abs: 1310 cells/uL — ABNORMAL LOW (ref 1500–7800)
Neutrophils Relative %: 36.4 %
Platelets: 167 10*3/uL (ref 140–400)
RBC: 4.41 10*6/uL (ref 3.80–5.10)
RDW: 13 % (ref 11.0–15.0)
Total Lymphocyte: 51.7 %
WBC mixed population: 234 cells/uL (ref 200–950)
WBC: 3.6 10*3/uL — ABNORMAL LOW (ref 3.8–10.8)

## 2017-09-13 LAB — URINALYSIS, ROUTINE W REFLEX MICROSCOPIC
Bilirubin Urine: NEGATIVE
Glucose, UA: NEGATIVE
HGB URINE DIPSTICK: NEGATIVE
KETONES UR: NEGATIVE
Leukocytes, UA: NEGATIVE
NITRITE: NEGATIVE
PROTEIN: NEGATIVE
Specific Gravity, Urine: 1.026 (ref 1.001–1.03)
pH: 5.5 (ref 5.0–8.0)

## 2017-09-15 ENCOUNTER — Telehealth: Payer: Self-pay | Admitting: Internal Medicine

## 2017-09-15 ENCOUNTER — Encounter: Payer: Self-pay | Admitting: Family Medicine

## 2017-09-15 ENCOUNTER — Encounter: Payer: Self-pay | Admitting: Internal Medicine

## 2017-09-15 LAB — COMPREHENSIVE METABOLIC PANEL
AG Ratio: 1.3 (calc) (ref 1.0–2.5)
ALKALINE PHOSPHATASE (APISO): 82 U/L (ref 33–130)
ALT: 16 U/L (ref 6–29)
AST: 20 U/L (ref 10–35)
Albumin: 3.5 g/dL — ABNORMAL LOW (ref 3.6–5.1)
BUN: 15 mg/dL (ref 7–25)
CHLORIDE: 110 mmol/L (ref 98–110)
CO2: 19 mmol/L — AB (ref 20–32)
CREATININE: 0.82 mg/dL (ref 0.50–1.05)
Calcium: 8.8 mg/dL (ref 8.6–10.4)
GLOBULIN: 2.6 g/dL (ref 1.9–3.7)
GLUCOSE: 112 mg/dL — AB (ref 65–99)
Potassium: 3.8 mmol/L (ref 3.5–5.3)
Sodium: 140 mmol/L (ref 135–146)
Total Bilirubin: 0.5 mg/dL (ref 0.2–1.2)
Total Protein: 6.1 g/dL (ref 6.1–8.1)

## 2017-09-15 LAB — HSV 2 ANTIBODY, IGG

## 2017-09-15 LAB — MAGNESIUM: Magnesium: 2.1 mg/dL (ref 1.5–2.5)

## 2017-09-15 LAB — RPR: RPR: NONREACTIVE

## 2017-09-15 LAB — VITAMIN B12: Vitamin B-12: 487 pg/mL (ref 200–1100)

## 2017-09-15 LAB — TSH: TSH: 1.21 mIU/L

## 2017-09-15 LAB — T4, FREE: FREE T4: 1.1 ng/dL (ref 0.8–1.8)

## 2017-09-15 LAB — HIV ANTIBODY (ROUTINE TESTING W REFLEX): HIV: NONREACTIVE

## 2017-09-15 LAB — HSV 1 ANTIBODY, IGG: HSV 1 GLYCOPROTEIN G AB, IGG: 54.7 {index} — AB

## 2017-09-15 NOTE — Telephone Encounter (Signed)
[facility] call radiology CXR not resulted   [phone]   [place]

## 2017-09-16 ENCOUNTER — Telehealth: Payer: Self-pay

## 2017-09-16 ENCOUNTER — Encounter: Payer: Self-pay | Admitting: Internal Medicine

## 2017-09-16 ENCOUNTER — Encounter: Payer: Self-pay | Admitting: Family Medicine

## 2017-09-16 DIAGNOSIS — D72819 Decreased white blood cell count, unspecified: Secondary | ICD-10-CM

## 2017-09-16 DIAGNOSIS — R0602 Shortness of breath: Secondary | ICD-10-CM | POA: Diagnosis not present

## 2017-09-16 LAB — URINE CYTOLOGY ANCILLARY ONLY
Chlamydia: NEGATIVE
NEISSERIA GONORRHEA: NEGATIVE
TRICH (WINDOWPATH): NEGATIVE

## 2017-09-16 NOTE — Telephone Encounter (Signed)
Patient was seen by Dr. Terese Door for this issue.  I will forward this message to her given that I do not have any information regarding the office visit.

## 2017-09-16 NOTE — Telephone Encounter (Signed)
Copied from [place] [phone]. Topic: General - Other >> Sep 16, 2017  3:14 PM Carolyn Stare wrote:  Pt saw a doctor Friday and said she is still not feeling well and would like a call back, she was not given any medicine still have a cough, congestion   [place]

## 2017-09-16 NOTE — Telephone Encounter (Signed)
CXR was normal no pneumonia  She can try OTC robitussin or mucinex DM, warm tea with honey and lemon and cough drops  no antibiotic indicated   Let me know if she has questions with this also review labs and imaging CXR with pt she has sent several my chart messages which Ive resulted these already   [place]

## 2017-09-16 NOTE — Telephone Encounter (Signed)
Results have been posted 

## 2017-09-16 NOTE — Telephone Encounter (Signed)
Patient saw Dr.Mclean 

## 2017-09-17 ENCOUNTER — Encounter: Payer: Self-pay | Admitting: Internal Medicine

## 2017-09-17 ENCOUNTER — Telehealth: Payer: Self-pay | Admitting: Internal Medicine

## 2017-09-17 NOTE — Telephone Encounter (Signed)
Labs  White blood cell ct slightly low this has been seen in past will need to monitor with PCP   She is prediabetic   STD labs Herpes 1 cold sores +  -I ordered vitamin D, hepatitis B and C labs but these were not drawn in the lab for some reason   -I want her to have a gastric emptying study to see how well her stomach empties food our office will schedule   [place]

## 2017-09-17 NOTE — Telephone Encounter (Signed)
Patient was informed of results via mychart 

## 2017-09-18 ENCOUNTER — Encounter (INDEPENDENT_AMBULATORY_CARE_PROVIDER_SITE_OTHER): Payer: Self-pay

## 2017-09-19 NOTE — Telephone Encounter (Signed)
My chart message has been sent to patient.

## 2017-09-28 DIAGNOSIS — D72819 Decreased white blood cell count, unspecified: Secondary | ICD-10-CM | POA: Insufficient documentation

## 2017-09-28 NOTE — Progress Notes (Signed)
[place]  [phone] [phone]  ID: Denise Davis OB: [birth year]  MR#: [id number]  YHC#:[id number]  Patient Care Team: Leone Haven, MD as PCP - General (Family Medicine) Pleasant, Eppie Gibson, RN as [facility]  CHIEF COMPLAINT:  Leukopenia.  INTERVAL HISTORY: Patient is a 52 year old female who was noted to have a persistently decreased white blood cell count on routine blood work.  She is anxious, but otherwise feels well.  She denies any repeated fevers or illnesses.  She has no recent infections and no new medications.  She has no neurologic complaints.  She has a good appetite and denies weight loss.  She has no chest pain or shortness of breath.  She denies any nausea, vomiting, constipation, or diarrhea.  She has no urinary complaints.  Patient feels at her baseline offers no specific complaints today.  REVIEW OF SYSTEMS:   Review of Systems  Constitutional: Negative.  Negative for fever, malaise/fatigue and weight loss.  Respiratory: Negative.  Negative for cough and shortness of breath.   Cardiovascular: Negative.  Negative for chest pain and leg swelling.  Gastrointestinal: Negative.  Negative for abdominal pain.  Genitourinary: Negative.  Negative for dysuria.  Musculoskeletal: Negative.  Negative for back pain.  Skin: Negative.  Negative for rash.  Neurological: Negative.  Negative for sensory change, focal weakness, weakness and headaches.  Psychiatric/Behavioral: The patient is nervous/anxious.     As per HPI. Otherwise, a complete review of systems is negative.  PAST MEDICAL HISTORY: Past Medical History:  Diagnosis Date  . Allergic rhinitis   . Anxiety   . Depression   . Diabetes mellitus    taken off meds in beginning fo 2018 dueto weight loss   . DVT of leg (deep venous thrombosis) ([place]) 12/2007   left leg, given warfarin for 6 mos  . GERD (gastroesophageal reflux disease)   . H/O  hiatal hernia   . Hyperlipidemia   . Hypertension   . Morbid obesity ([place])   . Pericarditis    Diagnosed/treated at [facility]; per patient 4 episodes per cardiologist Dr. Edwin Dada persistant chest pain unclear if pericarditis verusus neuropsychogenic   . Renal cell cancer ([place]) 2009   S/p nephrectomy (left)  . Seizure disorder ([place])    (04/08/17)- None in over 1 yr, no meds.    PAST SURGICAL HISTORY: Past Surgical History:  Procedure Laterality Date  . CHOLECYSTECTOMY N/A 05/02/2017   Procedure: LAPAROSCOPIC CHOLECYSTECTOMY WITH INTRAOPERATIVE CHOLANGIOGRAM;  Surgeon: Johnathan Hausen, MD;  Location: [facility];  Service: General;  Laterality: N/A;  . ENDOMETRIAL ABLATION  2007  . ESOPHAGEAL MANOMETRY N/A 05/21/2016   Procedure: ESOPHAGEAL MANOMETRY (EM);  Surgeon: Lucilla Lame, MD;  Location: [facility] ENDOSCOPY;  Service: Endoscopy;  Laterality: N/A;  . ESOPHAGOGASTRODUODENOSCOPY (EGD) WITH PROPOFOL N/A 05/14/2016   Procedure: ESOPHAGOGASTRODUODENOSCOPY (EGD) WITH PROPOFOL;  Surgeon: Lucilla Lame, MD;  Location: [facility] ENDOSCOPY;  Service: Endoscopy;  Laterality: N/A;  . ESOPHAGOGASTRODUODENOSCOPY (EGD) WITH PROPOFOL N/A 04/10/2017   Procedure: ESOPHAGOGASTRODUODENOSCOPY (EGD) WITH PROPOFOL;  Surgeon: Lucilla Lame, MD;  Location: [place];  Service: Endoscopy;  Laterality: N/A;  . INSERTION OF MESH  2014  . LEFT HEART CATHETERIZATION WITH CORONARY ANGIOGRAM N/A 08/30/2013   Procedure: LEFT HEART CATHETERIZATION WITH CORONARY ANGIOGRAM;  Surgeon: Troy Sine, MD;  Location: [facility] CATH LAB;  Service: Cardiovascular;  Laterality: N/A;  . NEPHRECTOMY    . REPLACEMENT TOTAL KNEE BILATERAL Bilateral 2018   2/18, 5/18 [facility]  . ROUX-EN-Y  GASTRIC BYPASS  04/27/2015   [facility]  . TUBAL LIGATION    . UMBILICAL HERNIA REPAIR      FAMILY HISTORY: Family History  Problem Relation Age of Onset  . Hypertension Mother   . Breast cancer Mother 9  . Arthritis Mother   . Heart disease Paternal Grandmother   .  Alcoholism Father   . Hyperlipidemia Unknown        Parent  . Mental illness Unknown        Parent  . Diabetes Unknown        Parent    ADVANCED DIRECTIVES (Y/N):  N  HEALTH MAINTENANCE: Social History   Tobacco Use  . Smoking status: Former Smoker    Types: Cigarettes    Last attempt to quit: 1984    Years since quitting: 35.5  . Smokeless tobacco: Never Used  Substance Use Topics  . Alcohol use: No    Frequency: Never  . Drug use: No     Colonoscopy:  PAP:  Bone density:  Lipid panel:  Allergies  Allergen Reactions  . Lisinopril Cough    Current Outpatient Medications  Medication Sig Dispense Refill  . albuterol (VENTOLIN HFA) 108 (90 Base) MCG/ACT inhaler Inhale 2 puffs into the lungs every 6 (six) hours as needed for wheezing or shortness of breath.    Marland Kitchen amoxicillin-clavulanate (AUGMENTIN) 875-125 MG tablet Take 1 tablet by mouth. For dental    . aspirin 81 MG chewable tablet Chew 81 mg by mouth daily.    . butalbital-acetaminophen-caffeine (FIORICET, ESGIC) 50-325-40 MG tablet Take 1 tablet by mouth every 4 (four) hours as needed for headache. 20 tablet 0  . calcium-vitamin D 250-100 MG-UNIT tablet Take 1 tablet by mouth 2 (two) times daily. Reported on 06/16/2015 180 tablet 3  . carvedilol (COREG) 12.5 MG tablet Take 1 tablet (12.5 mg total) by mouth 2 (two) times daily with a meal. 60 tablet 6  . Cholecalciferol (VITAMIN D-3) 1000 units CAPS Take 1 capsule (1,000 Units total) by mouth daily. Reported on 06/16/2015 90 capsule 3  . clotrimazole-betamethasone (LOTRISONE) cream Apply 1 application topically 2 (two) times daily. 30 g 0  . fluticasone (FLONASE) 50 MCG/ACT nasal spray Place 2 sprays into both nostrils daily. 16 g 6  . hydrOXYzine (VISTARIL) 50 MG capsule Take 1 capsule (50 mg total) by mouth 3 (three) times daily as needed for itching. 30 capsule 0  . LamoTRIgine 300 MG TB24 24 hour tablet Take 1 tablet (300 mg total) by mouth at bedtime. 90 tablet 4  .  loratadine (CLARITIN) 10 MG tablet Take 1 tablet (10 mg total) by mouth daily. 90 tablet 3  . Melatonin 3 MG TABS Take 9 mg by mouth.     . Multiple Vitamins-Minerals (MULTIVITAMIN WITH MINERALS) tablet Take 1 tablet by mouth daily. Reported on 06/16/2015    . nitroGLYCERIN (NITROSTAT) 0.4 MG SL tablet Place 1 tablet (0.4 mg total) under the tongue every 5 (five) minutes as needed for chest pain. (may repeat every 5 minutes but seek medical help if pain persists after 3 tablets) 25 tablet 0  . ondansetron (ZOFRAN-ODT) 4 MG disintegrating tablet DISSOLVE 1 TABLET IN MOUTH EVERY 8 HOURS AS NEEDED FOR NAUSEA OR VOMITING 20 tablet 0  . oxyCODONE (OXY IR/ROXICODONE) 5 MG immediate release tablet Take 1 tablet (5 mg total) by mouth every 6 (six) hours as needed for moderate pain. 20 tablet 0  . promethazine (PHENERGAN) 25 MG tablet TAKE 1 TABLET BY  MOUTH EVERY 8 HOURS AS NEEDED FOR NAUSEA OR VOMITING 30 tablet 2  . telmisartan (MICARDIS) 20 MG tablet Take 1 tablet (20 mg total) by mouth daily. 90 tablet 3  . triamcinolone cream (KENALOG) 0.1 % APPLY TOPICALLY TWO TIMES DAILY 30 g 0  . vitamin B-12 (CYANOCOBALAMIN) 500 MCG tablet Take by mouth.    . dexlansoprazole (DEXILANT) 60 MG capsule Take 1 capsule (60 mg total) by mouth daily. 30 capsule 6   No current facility-administered medications for this visit.     OBJECTIVE: Vitals:   09/29/17 1324 09/29/17 1332  BP:  105/72  Pulse:  80  Resp: 16   Temp:  98.6 F (37 C)     Body mass index is 35.45 kg/m.    ECOG FS:0 - Asymptomatic  General: Well-developed, well-nourished, no acute distress. Eyes: Pink conjunctiva, anicteric sclera. HEENT: Normocephalic, moist mucous membranes, clear oropharnyx. Lungs: Clear to auscultation bilaterally. Heart: Regular rate and rhythm. No rubs, murmurs, or gallops. Abdomen: Soft, nontender, nondistended. No organomegaly noted, normoactive bowel sounds. Musculoskeletal: No edema, cyanosis, or clubbing. Neuro:  Alert, answering all questions appropriately. Cranial nerves grossly intact. Skin: No rashes or petechiae noted. Psych: Normal affect. Lymphatics: No cervical, calvicular, axillary or inguinal LAD.   LAB RESULTS:  Lab Results  Component Value Date   NA 140 09/12/2017   K 3.8 09/12/2017   CL 110 09/12/2017   CO2 19 (L) 09/12/2017   GLUCOSE 112 (H) 09/12/2017   BUN 15 09/12/2017   CREATININE 0.82 09/12/2017   CALCIUM 8.8 09/12/2017   PROT 6.1 09/12/2017   ALBUMIN 3.5 07/16/2017   AST 20 09/12/2017   ALT 16 09/12/2017   ALKPHOS 71 07/16/2017   BILITOT 0.5 09/12/2017   GFRNONAA >60 06/24/2017   GFRAA >60 06/24/2017    Lab Results  Component Value Date   WBC 4.1 09/29/2017   NEUTROABS 1.7 09/29/2017   HGB 13.2 09/29/2017   HCT 39.3 09/29/2017   MCV 95.6 09/29/2017   PLT 189 09/29/2017     STUDIES: Dg Chest 2 View  Result Date: 09/16/2017 CLINICAL DATA:  Shortness of breath and cardiac palpitations EXAM: CHEST - 2 VIEW COMPARISON:  Chest radiograph December 18, 2016 and chest CT December 20, 2016 FINDINGS: No edema or consolidation. The heart size and pulmonary vascularity are normal. No adenopathy. No pneumothorax. No bone lesions. IMPRESSION: No edema or consolidation. Electronically Signed   By: Lowella Grip III M.D.   On: 09/16/2017 09:47    ASSESSMENT: Leukopenia.  PLAN:    1.  Leukopenia: Resolved.  Patient's white blood cell count is within normal limits today.  All of her other laboratory work including ferritin, iron panel, folate, and B12 levels are also within normal limits.  Neutrophil antibodies as well as peripheral blood flow cytometry have been drawn and are pending at time of dictation.  No intervention is needed at this time.  Patient does not require bone marrow biopsy.  Return to clinic in 3 months with repeat laboratory work and further evaluation.  If her white count continues to be within normal limits at that time, she likely can be discharged  from clinic.  I spent a total of 45 minutes face-to-face with the patient of which greater than 50% of the visit was spent in counseling and coordination of care as detailed above.  Patient expressed understanding and was in agreement with this plan. She also understands that She can call clinic at any time with any questions, concerns,  or complaints.    Lloyd Huger, MD   09/30/2017 11:23 AM

## 2017-09-29 ENCOUNTER — Encounter: Payer: Self-pay | Admitting: Oncology

## 2017-09-29 ENCOUNTER — Other Ambulatory Visit: Payer: Self-pay

## 2017-09-29 ENCOUNTER — Inpatient Hospital Stay: Payer: PPO

## 2017-09-29 ENCOUNTER — Inpatient Hospital Stay: Payer: PPO | Attending: Oncology | Admitting: Oncology

## 2017-09-29 DIAGNOSIS — Z79899 Other long term (current) drug therapy: Secondary | ICD-10-CM | POA: Insufficient documentation

## 2017-09-29 DIAGNOSIS — Z7982 Long term (current) use of aspirin: Secondary | ICD-10-CM | POA: Insufficient documentation

## 2017-09-29 DIAGNOSIS — I10 Essential (primary) hypertension: Secondary | ICD-10-CM | POA: Insufficient documentation

## 2017-09-29 DIAGNOSIS — Z87891 Personal history of nicotine dependence: Secondary | ICD-10-CM

## 2017-09-29 DIAGNOSIS — F418 Other specified anxiety disorders: Secondary | ICD-10-CM | POA: Diagnosis not present

## 2017-09-29 DIAGNOSIS — Z85528 Personal history of other malignant neoplasm of kidney: Secondary | ICD-10-CM | POA: Diagnosis not present

## 2017-09-29 DIAGNOSIS — G40909 Epilepsy, unspecified, not intractable, without status epilepticus: Secondary | ICD-10-CM | POA: Insufficient documentation

## 2017-09-29 DIAGNOSIS — E119 Type 2 diabetes mellitus without complications: Secondary | ICD-10-CM | POA: Diagnosis not present

## 2017-09-29 DIAGNOSIS — E785 Hyperlipidemia, unspecified: Secondary | ICD-10-CM | POA: Insufficient documentation

## 2017-09-29 DIAGNOSIS — K219 Gastro-esophageal reflux disease without esophagitis: Secondary | ICD-10-CM

## 2017-09-29 DIAGNOSIS — D72819 Decreased white blood cell count, unspecified: Secondary | ICD-10-CM | POA: Diagnosis not present

## 2017-09-29 DIAGNOSIS — Z803 Family history of malignant neoplasm of breast: Secondary | ICD-10-CM

## 2017-09-29 DIAGNOSIS — Z86718 Personal history of other venous thrombosis and embolism: Secondary | ICD-10-CM | POA: Insufficient documentation

## 2017-09-29 DIAGNOSIS — E669 Obesity, unspecified: Secondary | ICD-10-CM | POA: Diagnosis not present

## 2017-09-29 LAB — CBC WITH DIFFERENTIAL/PLATELET
BASOS PCT: 1 %
Basophils Absolute: 0 10*3/uL (ref 0–0.1)
EOS ABS: 0.1 10*3/uL (ref 0–0.7)
Eosinophils Relative: 4 %
HCT: 39.3 % (ref 35.0–47.0)
HEMOGLOBIN: 13.2 g/dL (ref 12.0–16.0)
Lymphocytes Relative: 44 %
Lymphs Abs: 1.8 10*3/uL (ref 1.0–3.6)
MCH: 32.2 pg (ref 26.0–34.0)
MCHC: 33.7 g/dL (ref 32.0–36.0)
MCV: 95.6 fL (ref 80.0–100.0)
MONO ABS: 0.4 10*3/uL (ref 0.2–0.9)
MONOS PCT: 9 %
NEUTROS ABS: 1.7 10*3/uL (ref 1.4–6.5)
NEUTROS PCT: 42 %
Platelets: 189 10*3/uL (ref 150–440)
RBC: 4.11 MIL/uL (ref 3.80–5.20)
RDW: 13.9 % (ref 11.5–14.5)
WBC: 4.1 10*3/uL (ref 3.6–11.0)

## 2017-09-29 LAB — IRON AND TIBC
IRON: 109 ug/dL (ref 28–170)
Saturation Ratios: 31 % (ref 10.4–31.8)
TIBC: 351 ug/dL (ref 250–450)
UIBC: 242 ug/dL

## 2017-09-29 LAB — FOLATE: FOLATE: 26 ng/mL (ref >5.9)

## 2017-09-29 LAB — FERRITIN: FERRITIN: 42 ng/mL (ref 11–307)

## 2017-09-29 LAB — VITAMIN B12: Vitamin B-12: 289 pg/mL (ref 180–914)

## 2017-09-29 NOTE — Progress Notes (Signed)
Patient here for initial visit. She reports "feeling More tired for the past several months

## 2017-09-30 ENCOUNTER — Encounter: Payer: Self-pay | Admitting: Neurology

## 2017-09-30 DIAGNOSIS — R799 Abnormal finding of blood chemistry, unspecified: Secondary | ICD-10-CM | POA: Diagnosis not present

## 2017-09-30 DIAGNOSIS — R634 Abnormal weight loss: Secondary | ICD-10-CM | POA: Diagnosis not present

## 2017-09-30 DIAGNOSIS — L574 Cutis laxa senilis: Secondary | ICD-10-CM | POA: Diagnosis not present

## 2017-09-30 DIAGNOSIS — Z9884 Bariatric surgery status: Secondary | ICD-10-CM | POA: Diagnosis not present

## 2017-09-30 DIAGNOSIS — R21 Rash and other nonspecific skin eruption: Secondary | ICD-10-CM | POA: Diagnosis not present

## 2017-09-30 DIAGNOSIS — K911 Postgastric surgery syndromes: Secondary | ICD-10-CM | POA: Diagnosis not present

## 2017-10-01 ENCOUNTER — Telehealth: Payer: Self-pay | Admitting: *Deleted

## 2017-10-01 ENCOUNTER — Other Ambulatory Visit (INDEPENDENT_AMBULATORY_CARE_PROVIDER_SITE_OTHER): Payer: Self-pay

## 2017-10-01 ENCOUNTER — Encounter
Admission: RE | Admit: 2017-10-01 | Discharge: 2017-10-01 | Disposition: A | Discharge status: Home / Self Care | Payer: PPO | Source: Ambulatory Visit | Attending: Internal Medicine | Admitting: Internal Medicine

## 2017-10-01 ENCOUNTER — Other Ambulatory Visit: Payer: Self-pay | Admitting: *Deleted

## 2017-10-01 DIAGNOSIS — Z9884 Bariatric surgery status: Secondary | ICD-10-CM | POA: Insufficient documentation

## 2017-10-01 DIAGNOSIS — R569 Unspecified convulsions: Secondary | ICD-10-CM

## 2017-10-01 DIAGNOSIS — Z79899 Other long term (current) drug therapy: Secondary | ICD-10-CM

## 2017-10-01 DIAGNOSIS — R112 Nausea with vomiting, unspecified: Secondary | ICD-10-CM | POA: Diagnosis not present

## 2017-10-01 DIAGNOSIS — Z0289 Encounter for other administrative examinations: Secondary | ICD-10-CM

## 2017-10-01 LAB — COMP PANEL: LEUKEMIA/LYMPHOMA

## 2017-10-01 MED ORDER — TECHNETIUM TC 99M SULFUR COLLOID
2.0000 | Freq: Once | INTRAVENOUS | Status: AC | PRN
  Administered 2017-10-01: 2.59 via ORAL

## 2017-10-01 MED ORDER — LAMOTRIGINE ER 200 MG PO TB24: ORAL_TABLET | ORAL | 3 refills | Status: DC

## 2017-10-01 NOTE — Telephone Encounter (Signed)
Email from patient:  Hello Dr. Krista Blue    I had another pass out episode today. I didn't go to the ER because, they never find anything. I just wanted you to know

## 2017-10-01 NOTE — Telephone Encounter (Signed)
Spoke to patient - reports another passing out event on 09/30/17. Dr. Krista Blue has reviewed her chart and provided verbal orders for the following:  1) come in today for lamotrigine labs (order in Epic) 2) increase lamotrigine ER dose to 200mg , 2 tablets at bedtime  The patient is agreeable to this plan. She verbalized understanding of no driving until being event-free for six months (per [place] law). New rx sent to the pharmacy.

## 2017-10-01 NOTE — Addendum Note (Signed)
Addended by: Noberto Retort C on: 10/01/2017 10:23 AM   Modules accepted: Orders

## 2017-10-05 LAB — LAMOTRIGINE LEVEL: Lamotrigine Lvl: 4.3 ug/mL (ref 2.0–20.0)

## 2017-10-07 LAB — NEUTROPHIL AB TEST LEVEL 1: NEUTROPHIL SCR/PANEL INTERP.: NEGATIVE

## 2017-10-08 ENCOUNTER — Telehealth: Payer: Self-pay | Admitting: Family Medicine

## 2017-10-08 DIAGNOSIS — R21 Rash and other nonspecific skin eruption: Secondary | ICD-10-CM

## 2017-10-08 NOTE — Telephone Encounter (Signed)
Referral placed.

## 2017-10-08 NOTE — Telephone Encounter (Signed)
Please advise 

## 2017-10-08 NOTE — Telephone Encounter (Signed)
Copied from [place] [id number]. Topic: Referral - Request >> Oct 08, 2017 12:42 PM Margot Ables wrote: Reason for CRM: pt was told by Gaspar Cola (who she sees for weight mgmt) that she needs to see a dermatologist due to itchy excess skin. Pt requesting referral to somewhere in [place]. Please call to advise.

## 2017-10-13 ENCOUNTER — Ambulatory Visit: Payer: PPO | Admitting: Gastroenterology

## 2017-10-23 ENCOUNTER — Other Ambulatory Visit: Payer: Self-pay | Admitting: Family Medicine

## 2017-10-28 ENCOUNTER — Encounter

## 2017-10-28 ENCOUNTER — Ambulatory Visit: Payer: Self-pay | Admitting: Gastroenterology

## 2017-11-06 DIAGNOSIS — K219 Gastro-esophageal reflux disease without esophagitis: Secondary | ICD-10-CM

## 2017-11-10 ENCOUNTER — Telehealth: Payer: Self-pay | Admitting: *Deleted

## 2017-11-10 NOTE — Telephone Encounter (Signed)
Tier Exception approved by Manson Passey [id number]) through 03/31/18.  Her co-pay will be zero.  Member VX#[id number].

## 2017-11-17 ENCOUNTER — Ambulatory Visit (INDEPENDENT_AMBULATORY_CARE_PROVIDER_SITE_OTHER): Payer: PPO | Admitting: Family Medicine

## 2017-11-17 ENCOUNTER — Ambulatory Visit: Payer: Self-pay

## 2017-11-17 ENCOUNTER — Encounter: Payer: Self-pay | Admitting: Family Medicine

## 2017-11-17 VITALS — BP 110/82 | HR 89 | Temp 99.8°F | Ht 65.0 in | Wt 214.0 lb

## 2017-11-17 DIAGNOSIS — T300 Burn of unspecified body region, unspecified degree: Secondary | ICD-10-CM

## 2017-11-17 MED ORDER — CEPHALEXIN 500 MG PO CAPS: 500.0000 mg | ORAL_CAPSULE | Freq: Two times a day (BID) | ORAL | 0 refills | Status: AC

## 2017-11-17 NOTE — Telephone Encounter (Signed)
FYI patient is seeing you today

## 2017-11-17 NOTE — Patient Instructions (Signed)
Great to meet you!  Use AD ointment or Aquaphor ointment on burned areas of skin  Keflex twice daily for 7 days
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
## 2017-12-04 ENCOUNTER — Ambulatory Visit (INDEPENDENT_AMBULATORY_CARE_PROVIDER_SITE_OTHER): Payer: PPO | Admitting: Family Medicine

## 2017-12-04 ENCOUNTER — Encounter: Payer: Self-pay | Admitting: Family Medicine

## 2017-12-04 VITALS — BP 126/84 | HR 81 | Temp 99.2°F | Resp 15 | Ht 65.0 in | Wt 211.6 lb

## 2017-12-04 DIAGNOSIS — R531 Weakness: Secondary | ICD-10-CM

## 2017-12-04 DIAGNOSIS — R630 Anorexia: Secondary | ICD-10-CM | POA: Diagnosis not present

## 2017-12-04 DIAGNOSIS — F419 Anxiety disorder, unspecified: Secondary | ICD-10-CM | POA: Diagnosis not present

## 2017-12-04 DIAGNOSIS — Z9884 Bariatric surgery status: Secondary | ICD-10-CM | POA: Diagnosis not present

## 2017-12-04 DIAGNOSIS — R52 Pain, unspecified: Secondary | ICD-10-CM

## 2017-12-04 DIAGNOSIS — F329 Major depressive disorder, single episode, unspecified: Secondary | ICD-10-CM

## 2017-12-04 DIAGNOSIS — R5383 Other fatigue: Secondary | ICD-10-CM | POA: Diagnosis not present

## 2017-12-04 DIAGNOSIS — J3089 Other allergic rhinitis: Secondary | ICD-10-CM | POA: Diagnosis not present

## 2017-12-04 LAB — CBC
HCT: 42.2 % (ref 36.0–46.0)
HEMOGLOBIN: 14.2 g/dL (ref 12.0–15.0)
MCHC: 33.7 g/dL (ref 30.0–36.0)
MCV: 94 fl (ref 78.0–100.0)
PLATELETS: 180 10*3/uL (ref 150.0–400.0)
RBC: 4.49 Mil/uL (ref 3.87–5.11)
RDW: 14.1 % (ref 11.5–15.5)
WBC: 3.6 10*3/uL — ABNORMAL LOW (ref 4.0–10.5)

## 2017-12-04 LAB — COMPREHENSIVE METABOLIC PANEL
ALT: 22 U/L (ref 0–35)
AST: 21 U/L (ref 0–37)
Albumin: 3.9 g/dL (ref 3.5–5.2)
Alkaline Phosphatase: 81 U/L (ref 39–117)
BUN: 18 mg/dL (ref 6–23)
CALCIUM: 9 mg/dL (ref 8.4–10.5)
CO2: 27 meq/L (ref 19–32)
Chloride: 110 mEq/L (ref 96–112)
Creatinine, Ser: 0.82 mg/dL (ref 0.40–1.20)
GFR: 94.06 mL/min (ref >60.00)
Glucose, Bld: 94 mg/dL (ref 70–99)
Potassium: 3.9 mEq/L (ref 3.5–5.1)
Sodium: 142 mEq/L (ref 135–145)
Total Bilirubin: 0.5 mg/dL (ref 0.2–1.2)
Total Protein: 6.6 g/dL (ref 6.0–8.3)

## 2017-12-04 LAB — B12 AND FOLATE PANEL
FOLATE: 14.6 ng/mL (ref >5.9)
Vitamin B-12: 303 pg/mL (ref 211–911)

## 2017-12-04 LAB — VITAMIN D 25 HYDROXY (VIT D DEFICIENCY, FRACTURES): VITD: 31.74 ng/mL (ref 30.00–100.00)

## 2017-12-04 MED ORDER — ACETAMINOPHEN-CODEINE #3 300-30 MG PO TABS: 1.0000 | ORAL_TABLET | Freq: Four times a day (QID) | ORAL | 0 refills | Status: AC | PRN

## 2017-12-04 MED ORDER — FLUTICASONE PROPIONATE 50 MCG/ACT NA SUSP: 2.0000 | Freq: Every day | NASAL | 6 refills | Status: DC

## 2017-12-04 MED ORDER — ONDANSETRON 4 MG PO TBDP: ORAL_TABLET | ORAL | 0 refills | Status: DC

## 2017-12-04 MED ORDER — SERTRALINE HCL 50 MG PO TABS: 50.0000 mg | ORAL_TABLET | Freq: Every day | ORAL | 3 refills | Status: DC

## 2017-12-04 MED ORDER — PROMETHAZINE HCL 25 MG PO TABS: ORAL_TABLET | ORAL | 2 refills | Status: DC

## 2017-12-04 MED ORDER — ALBUTEROL SULFATE HFA 108 (90 BASE) MCG/ACT IN AERS: 2.0000 | INHALATION_SPRAY | Freq: Four times a day (QID) | RESPIRATORY_TRACT | 1 refills | Status: AC | PRN

## 2017-12-04 MED ORDER — CLOTRIMAZOLE-BETAMETHASONE 1-0.05 % EX CREA: 1.0000 "application " | TOPICAL_CREAM | Freq: Two times a day (BID) | CUTANEOUS | 0 refills | Status: AC

## 2017-12-04 MED ORDER — HYDROXYZINE PAMOATE 50 MG PO CAPS: 50.0000 mg | ORAL_CAPSULE | Freq: Three times a day (TID) | ORAL | 0 refills | Status: DC | PRN

## 2017-12-04 NOTE — Progress Notes (Signed)
Subjective:    Patient ID: Denise Davis, female    DOB: 04-06-1965, 52 y.o.   MRN: [id number]  HPI   Patient presents to clinic complaining of generalized upper extremity and lower extremity weakness, feeling tired, mood swings, loss of appetite.  States she had been on sertraline and Seroquel in the past, but currently is not taking his medications.  States she could no longer afford weekly co-pays with psychiatrist and therapist so was unable to get prescriptions refilled. Mood was better on these medications.   Patient states her arms and legs will ache even more at night and she will have to rub her extremities to help pain improve.  Patient had gastric bypass in 2017  Patient Active Problem List   Diagnosis Date Noted  . Leukopenia 09/28/2017  . Passed out [facility]) 08/07/2017  . Recurrent major depressive disorder, in remission ([place]) 07/23/2017  . Renal cell carcinoma ([place]) 07/23/2017  . Itching 06/27/2017  . Pain of toe of right foot 06/27/2017  . Headache 06/27/2017  . Seizure-like activity ([place]) 05/08/2017  . S/P laparoscopic cholecystectomy 05/02/2017  . BPPV (benign paroxysmal positional vertigo), right 01/24/2017  . SOB (shortness of breath) 12/18/2016  . Esophageal spasm 05/30/2016  . Nausea   . Bariatric surgery status   . Other specified diseases of esophagus   . Gout 03/19/2016  . Neuropathy 02/12/2016  . Decreased pedal pulses 02/12/2016  . Allergic rhinitis 02/12/2016  . Hypokalemia 12/08/2015  . Sleep apnea 12/01/2015  . Palpitations 12/01/2015  . Status post gastric bypass for obesity 10/03/2015  . Osteoarthritis 10/03/2015  . Cannabis use disorder, moderate, in sustained remission ([place]) 08/18/2015  . Post traumatic stress disorder (PTSD) 08/18/2015  . Marginal ulcer 05/31/2015  . Anxiety and depression 02/13/2015  . Seizure disorder ([place]) 02/13/2015  . GERD (gastroesophageal reflux disease) 02/13/2015  . Chronic respiratory failure ([place]) 08/28/2013    . Right sided weakness 08/25/2013  . Concussion 07/08/2013  . Insomnia 07/08/2013  . Otitis media 07/08/2013  . Zoster without complications 89/21/1941  . Pneumonia 04/29/2013  . Moderate episode of recurrent major depressive disorder ([place]) 10/27/2012  . Dysphonia 09/04/2012  . Vocal cord palsy 08/25/2012  . Vocal cord dysfunction 08/18/2012  . Morbid (severe) obesity due to excess calories ([place]) 08/11/2012  . Hypoglycemia 04/23/2012  . Other postprocedural status(V45.89) 02/11/2012  . Pain of lower extremity 01/01/2012  . Muscle pain 12/03/2011  . Achilles bursitis or tendinitis 08/06/2011  . Onychomycosis due to dermatophyte 08/06/2011  . Paronychia of toe 08/06/2011  . Syndrome affecting cervical region 05/17/2011  . Personal history of other venous thrombosis and embolism 05/15/2011  . Chest pain 05/09/2011  . Diabetes mellitus ([place])   . Hypertension   . Dizziness 08/22/2009  . Skin sensation disturbance 08/18/2009  . Visual disturbance 08/18/2009  . Umbilical hernia without obstruction or gangrene 09/28/2008  . Hyperlipidemia 09/07/2008  . Malignant neoplasm of kidney excluding renal pelvis ([place]) 09/07/2008   Review of Systems  Constitutional: Negative for chills,  fever. +fatigue HENT: Negative for congestion, ear pain, sinus pain and sore throat.   Eyes: Negative.   Respiratory: Negative for cough, shortness of breath and wheezing.   Cardiovascular: Negative for chest pain, palpitations and leg swelling.  Gastrointestinal: Negative for abdominal pain, diarrhea, nausea and vomiting.  Genitourinary: Negative for dysuria, frequency and urgency.  Musculoskeletal: +generalized arthralgias and myalgias.  Skin: Negative for color change, pallor and rash.  Neurological: Negative for syncope, light-headedness and headaches.  Psychiatric/Behavioral:  Mood swings from happy one moment, could cry the next      Objective:   Physical Exam  Constitutional: She is oriented to  person, place, and time. She appears well-developed and well-nourished. No distress.  HENT:  Head: Normocephalic and atraumatic.  Eyes: Conjunctivae and EOM are normal. No scleral icterus.  Neck: Neck supple. No tracheal deviation present.  Cardiovascular: Normal rate and regular rhythm.  Pulmonary/Chest: Effort normal and breath sounds normal. No respiratory distress.  Abdominal: Soft. There is no tenderness.  Musculoskeletal: Normal range of motion. She exhibits no edema.  Generalized aches  Neurological: She is alert and oriented to person, place, and time. No cranial nerve deficit.  Skin: Skin is warm and dry. She is not diaphoretic. No pallor.  Psychiatric: Her behavior is normal. Judgment and thought content normal.  No SI. Emotions are up and down.   Nursing note and vitals reviewed.  Vitals:   12/04/17 1337  BP: 126/84  Pulse: 81  Resp: 15  Temp: 99.2 F (37.3 C)  SpO2: 99%      Assessment & Plan:    A total of 40  minutes were spent face-to-face with the patient during this encounter and over half of that time was spent on counseling and coordination of care. The patient was counseled on causes of fatigue, plan to get back on SSRI medication to help control emotions, plan for lab work.    Fatigue/generalized weakness/generalized pain- unclear reason for the symptoms.  We will get new blood work to further evaluate.  These could be related to anemia, electrolyte and vitamin D deficiency, and/or anxiety and depression.  Patient request pain medication to take at night due to aches making her unable to sleep.  Patient aware that I will only give 10 tablets supply of Tylenol 3 to use as needed, narcotics are not meant for chronic use. [place] PMP registry checked and is appropriate.   Anxiety/depression- patient did have some success on sertraline in the past, will restart at 50 mg/day.  She declines referral to counseling at this time.  Decreased appetite-could be related to  anxiety depression also could be related to being a status post gastric bypass patient.  Requesting medication refills for Phenergan as needed for nausea, Flonase for seasonal allergies, hydroxyzine as needed for anxiety, Zofran as needed for nausea given as patient requested.  Patient will follow-up in approximately 4 weeks for recheck on mood after getting back on sertraline.

## 2017-12-04 NOTE — Patient Instructions (Signed)
Great to see you!  Lab work today in clinic

## 2017-12-05 ENCOUNTER — Other Ambulatory Visit: Payer: Self-pay | Admitting: Surgery

## 2017-12-05 ENCOUNTER — Encounter: Payer: Self-pay | Admitting: Family Medicine

## 2017-12-05 DIAGNOSIS — R630 Anorexia: Secondary | ICD-10-CM | POA: Insufficient documentation

## 2017-12-05 DIAGNOSIS — R5383 Other fatigue: Secondary | ICD-10-CM | POA: Insufficient documentation

## 2017-12-05 DIAGNOSIS — K769 Liver disease, unspecified: Secondary | ICD-10-CM

## 2017-12-05 DIAGNOSIS — R531 Weakness: Secondary | ICD-10-CM | POA: Insufficient documentation

## 2017-12-05 DIAGNOSIS — R52 Pain, unspecified: Secondary | ICD-10-CM | POA: Insufficient documentation

## 2017-12-05 LAB — IRON,TIBC AND FERRITIN PANEL
%SAT: 31 % (calc) (ref 16–45)
FERRITIN: 46 ng/mL (ref 16–232)
IRON: 113 ug/dL (ref 45–160)
TIBC: 362 ug/dL (ref 250–450)

## 2017-12-05 LAB — THYROID PANEL WITH TSH
Free Thyroxine Index: 2.2 (ref 1.4–3.8)
T3 Uptake: 32 % (ref 22–35)
T4, Total: 6.8 ug/dL (ref 5.1–11.9)
TSH: 1.25 m[IU]/L

## 2017-12-08 ENCOUNTER — Ambulatory Visit: Payer: Self-pay

## 2017-12-08 MED ORDER — FEXOFENADINE HCL 180 MG PO TABS: 180.0000 mg | ORAL_TABLET | Freq: Every day | ORAL | 2 refills | Status: DC

## 2017-12-08 NOTE — Addendum Note (Signed)
Addended by: Philis Nettle on: 12/08/2017 04:51 PM   Modules accepted: Orders

## 2017-12-08 NOTE — Progress Notes (Signed)
[facility] Consultation      Assessment and Plan:  Persistent asthma with exacerbation. -She is given a sample of Symbicort 160 to be used 2 puffs twice daily today. When she runs out of the sample. I asked her to switch back to her usual Qvar, 2 puffs twice daily and use her pro-air as needed. -She is asked to follow up/call back if she does not improve in the next few weeks. She may require further workup of her dyspnea, which may include an echocardiogram.  History of anxiety and vocal cord dysfunction. -She is going through multiple life stressors, including the recent death of her husband, and the hospitalization of her granddaughter, which may be contributing to her asthma/anxiety.  Obstructive sleep apnea. - Previous diagnosis of sleep apnea, stopped after had a lot of weight loss.  However she continues to have symptoms of excessive daytime sleepiness, difficulty maintaining sleep at night. -Will send for sleep study to see if she still has sleep apnea.  Date: 12/08/2017  MRN# [id number] Denise Davis Jun 08, 1965   Denise Davis is a 52 y.o. old female seen in consultation for chief complaint of:    Chief Complaint  Patient presents with  . Asthma    pt has chest tightness, cough and wheezing. She states asthma started acting up on Friday. Pt prefers Symbicort.    HPI:  Patient is a 52 year old female with a history of asthma, vocal cord dysfunction, history of bariatric surgery, kidney cancer s/p left nephrectomy, seen here for asthma.  At last visit it was found that she had multiple stressors including the recent death of her husband, which was contributing to vocal cord dysfunction.  She is also been off of her medications for asthma.  She was therefore restarted on Qvar and asked to use pro-air as needed.  She has been using the qvar three times per day because she has been having chest tightness.  She snores at night, she was diagnosed with OSA in  the past, but she lost a lot of weight. She is sleepy during the day, wakes up several times at night and has trouble falling back to sleep. Currently takes melatonin at night.  She has reflux which is controlled with dexilant. Denies sinus drainage.    **CT chest 12/20/16>> there are scattered bibasal are dependent areas of ground glass changes, which may be suggestive of mild pulmonary edema, mild bibasilar atelectasis. Otherwise unremarkable.  **On review of old notes, the patient was seen at [facility], she was found to have dyspnea on exertion, thought to be due to morbid obesity. She was planned for bariatric surgery, BMI at that time was 61. She was also tried in the past on qvar and albuterol and noted improvement. She has ahistory  OSA, poorly compliant with  PAP, vocal code dysfunction, panic disorder.   Medication:    Current Outpatient Medications:  .  albuterol (VENTOLIN HFA) 108 (90 Base) MCG/ACT inhaler, Inhale 2 puffs into the lungs every 6 (six) hours as needed for wheezing or shortness of breath., Disp: 3 Inhaler, Rfl: 1 .  amoxicillin-clavulanate (AUGMENTIN) 875-125 MG tablet, Take 1 tablet by mouth. For dental, Disp: , Rfl:  .  aspirin 81 MG chewable tablet, Chew 81 mg by mouth daily., Disp: , Rfl:  .  butalbital-acetaminophen-caffeine (FIORICET, ESGIC) 50-325-40 MG tablet, Take 1 tablet by mouth every 4 (four) hours as needed for headache., Disp: 20 tablet, Rfl: 0 .  calcium-vitamin D 250-100 MG-UNIT  tablet, Take 1 tablet by mouth 2 (two) times daily. Reported on 06/16/2015, Disp: 180 tablet, Rfl: 3 .  carvedilol (COREG) 12.5 MG tablet, Take 1 tablet (12.5 mg total) by mouth 2 (two) times daily with a meal., Disp: 60 tablet, Rfl: 6 .  Cholecalciferol (VITAMIN D-3) 1000 units CAPS, Take 1 capsule (1,000 Units total) by mouth daily. Reported on 06/16/2015, Disp: 90 capsule, Rfl: 3 .  clotrimazole-betamethasone (LOTRISONE) cream, Apply 1 application topically 2 (two) times daily.,  Disp: 30 g, Rfl: 0 .  DEXILANT 60 MG capsule, TAKE 1 CAPSULE BY MOUTH ONCE DAILY, Disp: 90 capsule, Rfl: 0 .  fluticasone (FLONASE) 50 MCG/ACT nasal spray, Place 2 sprays into both nostrils daily., Disp: 16 g, Rfl: 6 .  hydrOXYzine (VISTARIL) 50 MG capsule, Take 1 capsule (50 mg total) by mouth 3 (three) times daily as needed for itching., Disp: 30 capsule, Rfl: 0 .  LamoTRIgine 200 MG TB24 24 hour tablet, Take two tablets at bedtime., Disp: 180 tablet, Rfl: 3 .  loratadine (CLARITIN) 10 MG tablet, Take 1 tablet (10 mg total) by mouth daily., Disp: 90 tablet, Rfl: 3 .  Melatonin 3 MG TABS, Take 9 mg by mouth. , Disp: , Rfl:  .  Multiple Vitamins-Minerals (MULTIVITAMIN WITH MINERALS) tablet, Take 1 tablet by mouth daily. Reported on 06/16/2015, Disp: , Rfl:  .  nitroGLYCERIN (NITROSTAT) 0.4 MG SL tablet, Place 1 tablet (0.4 mg total) under the tongue every 5 (five) minutes as needed for chest pain. (may repeat every 5 minutes but seek medical help if pain persists after 3 tablets), Disp: 25 tablet, Rfl: 0 .  ondansetron (ZOFRAN-ODT) 4 MG disintegrating tablet, DISSOLVE 1 TABLET IN MOUTH EVERY 8 HOURS AS NEEDED FOR NAUSEA OR VOMITING, Disp: 20 tablet, Rfl: 0 .  oxyCODONE (OXY IR/ROXICODONE) 5 MG immediate release tablet, Take 1 tablet (5 mg total) by mouth every 6 (six) hours as needed for moderate pain., Disp: 20 tablet, Rfl: 0 .  promethazine (PHENERGAN) 25 MG tablet, TAKE 1 TABLET BY MOUTH EVERY 8 HOURS AS NEEDED FOR NAUSEA OR VOMITING, Disp: 30 tablet, Rfl: 2 .  sertraline (ZOLOFT) 50 MG tablet, Take 1 tablet (50 mg total) by mouth daily., Disp: 30 tablet, Rfl: 3 .  telmisartan (MICARDIS) 40 MG tablet, Take 40 mg by mouth daily., Disp: , Rfl: 1 .  triamcinolone cream (KENALOG) 0.1 %, APPLY TOPICALLY TWO TIMES DAILY, Disp: 30 g, Rfl: 0 .  vitamin B-12 (CYANOCOBALAMIN) 500 MCG tablet, Take by mouth., Disp: , Rfl:    Allergies:  Lisinopril  Review of Systems:  Constitutional: Feels  well. Cardiovascular: No chest pain.  Pulmonary: Denies hemoptysis.  The remainder of systems were reviewed and were found to be negative other than what is documented in the HPI.   Physical Examination:   VS: BP 138/88 (BP Location: Left Arm, Cuff Size: Large)   Pulse 76   Resp 16   Ht 5\' 5"  (1.651 m)   Wt 212 lb (96.2 kg)   SpO2 100%   BMI 35.28 kg/m   General Appearance: No distress  Neuro:without focal findings, mental status, speech normal, alert and oriented HEENT: PERRLA, EOM intact Pulmonary: No wheezing, No rales  CardiovascularNormal S1,S2.  No m/r/g.  Abdomen: Benign, Soft, non-tender, No masses Renal:  No costovertebral tenderness  GU:  No performed at this time. Endoc: No evident thyromegaly, no signs of acromegaly or Cushing features Skin:   warm, no rashes, no ecchymosis  Extremities: normal, no cyanosis, clubbing.  LABORATORY PANEL:   CBC Recent Labs  Lab 12/04/17 1404  WBC 3.6*  HGB 14.2  HCT 42.2  PLT 180.0   ------------------------------------------------------------------------------------------------------------------  Chemistries  Recent Labs  Lab 12/04/17 1404  NA 142  K 3.9  CL 110  CO2 27  GLUCOSE 94  BUN 18  CREATININE 0.82  CALCIUM 9.0  AST 21  ALT 22  ALKPHOS 81  BILITOT 0.5   ------------------------------------------------------------------------------------------------------------------  Cardiac Enzymes No results for input(s): TROPONINI in the last 168 hours. ------------------------------------------------------------  RADIOLOGY:  No results found.     Thank  you for the consultation and for allowing [facility] to assist in the care of your patient. Our recommendations are noted above.  Please contact us if we can be of further service.  Marda Stalker, M.D., F.C.C.P.  Board Certified in Internal Medicine, Pulmonary Medicine, [place], and Sleep Medicine.   [facility] Office Number: [phone]   12/08/2017

## 2017-12-09 ENCOUNTER — Ambulatory Visit (INDEPENDENT_AMBULATORY_CARE_PROVIDER_SITE_OTHER): Payer: PPO | Admitting: Internal Medicine

## 2017-12-09 ENCOUNTER — Encounter: Payer: Self-pay | Admitting: Internal Medicine

## 2017-12-09 VITALS — BP 138/88 | HR 76 | Resp 16 | Ht 65.0 in | Wt 212.0 lb

## 2017-12-09 DIAGNOSIS — J4541 Moderate persistent asthma with (acute) exacerbation: Secondary | ICD-10-CM | POA: Diagnosis not present

## 2017-12-09 DIAGNOSIS — G4719 Other hypersomnia: Secondary | ICD-10-CM | POA: Diagnosis not present

## 2017-12-09 DIAGNOSIS — Z23 Encounter for immunization: Secondary | ICD-10-CM

## 2017-12-09 MED ORDER — BUDESONIDE-FORMOTEROL FUMARATE 160-4.5 MCG/ACT IN AERO: 2.0000 | INHALATION_SPRAY | Freq: Two times a day (BID) | RESPIRATORY_TRACT | 12 refills | Status: AC

## 2017-12-09 NOTE — Patient Instructions (Addendum)
Will change qvar to symbicort 2 puffs twice daily. Rinse mouth after use.   Will give flu shot today.

## 2017-12-10 ENCOUNTER — Telehealth: Payer: Self-pay | Admitting: Family Medicine

## 2017-12-10 DIAGNOSIS — F419 Anxiety disorder, unspecified: Secondary | ICD-10-CM

## 2017-12-10 DIAGNOSIS — F329 Major depressive disorder, single episode, unspecified: Secondary | ICD-10-CM

## 2017-12-10 DIAGNOSIS — G8929 Other chronic pain: Secondary | ICD-10-CM

## 2017-12-10 NOTE — Telephone Encounter (Signed)
Patient states that tylenol 3 is not working.

## 2017-12-10 NOTE — Telephone Encounter (Signed)
How is her mood doing - I am thinking we can try cymbalta, which can treat both widespread pain and mood.  She recently started back on sertraline  LG

## 2017-12-10 NOTE — Telephone Encounter (Signed)
Copied from [place] [phone]. Topic: Quick Communication - See Telephone Encounter >> Dec 10, 2017  3:22 PM Gardiner Ramus wrote: CRM for notification. See Telephone encounter for: 12/10/17. Pt called and stated that Acetami is not working for the pain. She was seen in the office on 12/04/17. Please advise

## 2017-12-11 MED ORDER — DULOXETINE HCL 30 MG PO CPEP: 30.0000 mg | ORAL_CAPSULE | Freq: Every day | ORAL | 1 refills | Status: DC

## 2017-12-11 NOTE — Telephone Encounter (Signed)
cymbalta sent in

## 2017-12-11 NOTE — Telephone Encounter (Signed)
Patient states that she would like to try the cymbalta.

## 2017-12-18 ENCOUNTER — Ambulatory Visit
Admission: RE | Admit: 2017-12-18 | Discharge: 2017-12-18 | Disposition: A | Discharge status: Home / Self Care | Payer: PPO | Source: Ambulatory Visit | Attending: Surgery | Admitting: Surgery

## 2017-12-18 ENCOUNTER — Other Ambulatory Visit: Payer: Self-pay

## 2017-12-18 DIAGNOSIS — K769 Liver disease, unspecified: Secondary | ICD-10-CM | POA: Diagnosis not present

## 2017-12-18 MED ORDER — GADOBENATE DIMEGLUMINE 529 MG/ML IV SOLN
20.0000 mL | Freq: Once | INTRAVENOUS | Status: AC | PRN
  Administered 2017-12-18: 20 mL via INTRAVENOUS

## 2017-12-24 ENCOUNTER — Ambulatory Visit: Payer: Self-pay

## 2018-01-01 ENCOUNTER — Other Ambulatory Visit: Payer: Self-pay | Admitting: Cardiovascular Disease

## 2018-01-04 NOTE — Progress Notes (Deleted)
[place]  [phone]) [phone] Fax:(336) [phone]  ID: Denise Davis OB: 03/19/1966  MR#: [id number]  SWF#:[id number]  Patient Care Team: Leone Haven, MD as PCP - General (Family Medicine) Pleasant, Eppie Gibson, RN as [place] [facility]  CHIEF COMPLAINT:  Leukopenia.  INTERVAL HISTORY: Patient is a 52 year old female who was noted to have a persistently decreased white blood cell count on routine blood work.  She is anxious, but otherwise feels well.  She denies any repeated fevers or illnesses.  She has no recent infections and no new medications.  She has no neurologic complaints.  She has a good appetite and denies weight loss.  She has no chest pain or shortness of breath.  She denies any nausea, vomiting, constipation, or diarrhea.  She has no urinary complaints.  Patient feels at her baseline offers no specific complaints today.  REVIEW OF SYSTEMS:   Review of Systems  Constitutional: Negative.  Negative for fever, malaise/fatigue and weight loss.  Respiratory: Negative.  Negative for cough and shortness of breath.   Cardiovascular: Negative.  Negative for chest pain and leg swelling.  Gastrointestinal: Negative.  Negative for abdominal pain.  Genitourinary: Negative.  Negative for dysuria.  Musculoskeletal: Negative.  Negative for back pain.  Skin: Negative.  Negative for rash.  Neurological: Negative.  Negative for sensory change, focal weakness, weakness and headaches.  Psychiatric/Behavioral: The patient is nervous/anxious.     As per HPI. Otherwise, a complete review of systems is negative.  PAST MEDICAL HISTORY: Past Medical History:  Diagnosis Date  . Allergic rhinitis   . Anxiety   . Depression   . Diabetes mellitus    taken off meds in beginning fo 2018 dueto weight loss   . DVT of leg (deep venous thrombosis) ([place]) 12/2007   left leg, given warfarin for 6 mos  . GERD (gastroesophageal reflux disease)   . H/O  hiatal hernia   . Hyperlipidemia   . Hypertension   . Morbid obesity ([place])   . Pericarditis    Diagnosed/treated at [facility]; per patient 4 episodes per cardiologist Dr. Edwin Dada persistant chest pain unclear if pericarditis verusus neuropsychogenic   . Renal cell cancer ([place]) 2009   S/p nephrectomy (left)  . Seizure disorder ([place])    (04/08/17)- None in over 1 yr, no meds.    PAST SURGICAL HISTORY: Past Surgical History:  Procedure Laterality Date  . CHOLECYSTECTOMY N/A 05/02/2017   Procedure: LAPAROSCOPIC CHOLECYSTECTOMY WITH INTRAOPERATIVE CHOLANGIOGRAM;  Surgeon: Johnathan Hausen, MD;  Location: [facility];  Service: General;  Laterality: N/A;  . ENDOMETRIAL ABLATION  2007  . ESOPHAGEAL MANOMETRY N/A 05/21/2016   Procedure: ESOPHAGEAL MANOMETRY (EM);  Surgeon: Lucilla Lame, MD;  Location: [facility] ENDOSCOPY;  Service: Endoscopy;  Laterality: N/A;  . ESOPHAGOGASTRODUODENOSCOPY (EGD) WITH PROPOFOL N/A 05/14/2016   Procedure: ESOPHAGOGASTRODUODENOSCOPY (EGD) WITH PROPOFOL;  Surgeon: Lucilla Lame, MD;  Location: [facility] ENDOSCOPY;  Service: Endoscopy;  Laterality: N/A;  . ESOPHAGOGASTRODUODENOSCOPY (EGD) WITH PROPOFOL N/A 04/10/2017   Procedure: ESOPHAGOGASTRODUODENOSCOPY (EGD) WITH PROPOFOL;  Surgeon: Lucilla Lame, MD;  Location: [place];  Service: Endoscopy;  Laterality: N/A;  . INSERTION OF MESH  2014  . LEFT HEART CATHETERIZATION WITH CORONARY ANGIOGRAM N/A 08/30/2013   Procedure: LEFT HEART CATHETERIZATION WITH CORONARY ANGIOGRAM;  Surgeon: Troy Sine, MD;  Location: [facility] CATH LAB;  Service: Cardiovascular;  Laterality: N/A;  . NEPHRECTOMY    . REPLACEMENT TOTAL KNEE BILATERAL Bilateral 2018   2/18, 5/18 [facility]  . ROUX-EN-Y  GASTRIC BYPASS  04/27/2015   [facility]  . TUBAL LIGATION    . UMBILICAL HERNIA REPAIR      FAMILY HISTORY: Family History  Problem Relation Age of Onset  . Hypertension Mother   . Breast cancer Mother 3  . Arthritis Mother   . Heart disease Paternal Grandmother   .  Alcoholism Father   . Hyperlipidemia Unknown        Parent  . Mental illness Unknown        Parent  . Diabetes Unknown        Parent    ADVANCED DIRECTIVES (Y/N):  N  HEALTH MAINTENANCE: Social History   Tobacco Use  . Smoking status: Former Smoker    Types: Cigarettes    Last attempt to quit: 1984    Years since quitting: 35.7  . Smokeless tobacco: Never Used  Substance Use Topics  . Alcohol use: No    Frequency: Never  . Drug use: No     Colonoscopy:  PAP:  Bone density:  Lipid panel:  Allergies  Allergen Reactions  . Lisinopril Cough    Current Outpatient Medications  Medication Sig Dispense Refill  . acarbose (PRECOSE) 25 MG tablet Take 25 mg by mouth 3 (three) times daily with meals.    Marland Kitchen albuterol (VENTOLIN HFA) 108 (90 Base) MCG/ACT inhaler Inhale 2 puffs into the lungs every 6 (six) hours as needed for wheezing or shortness of breath. 3 Inhaler 1  . amoxicillin-clavulanate (AUGMENTIN) 875-125 MG tablet Take 1 tablet by mouth. For dental    . aspirin 81 MG chewable tablet Chew 81 mg by mouth daily.    . budesonide-formoterol (SYMBICORT) 160-4.5 MCG/ACT inhaler Inhale 2 puffs into the lungs 2 (two) times daily. Rinse mouth after use 1 Inhaler 12  . butalbital-acetaminophen-caffeine (FIORICET, ESGIC) 50-325-40 MG tablet Take 1 tablet by mouth every 4 (four) hours as needed for headache. 20 tablet 0  . calcium-vitamin D 250-100 MG-UNIT tablet Take 1 tablet by mouth 2 (two) times daily. Reported on 06/16/2015 180 tablet 3  . carvedilol (COREG) 12.5 MG tablet TAKE 1 TABLET BY MOUTH TWICE DAILY WITH MEALS 180 tablet 1  . Cholecalciferol (VITAMIN D-3) 1000 units CAPS Take 1 capsule (1,000 Units total) by mouth daily. Reported on 06/16/2015 90 capsule 3  . clotrimazole-betamethasone (LOTRISONE) cream Apply 1 application topically 2 (two) times daily. 30 g 0  . DEXILANT 60 MG capsule TAKE 1 CAPSULE BY MOUTH ONCE DAILY 90 capsule 0  . DULoxetine (CYMBALTA) 30 MG capsule  Take 1 capsule (30 mg total) by mouth daily. 30 capsule 1  . fexofenadine (ALLEGRA ALLERGY) 180 MG tablet Take 1 tablet (180 mg total) by mouth daily. 30 tablet 2  . fluticasone (FLONASE) 50 MCG/ACT nasal spray Place 2 sprays into both nostrils daily. 16 g 6  . hydrOXYzine (VISTARIL) 50 MG capsule Take 1 capsule (50 mg total) by mouth 3 (three) times daily as needed for itching. 30 capsule 0  . LamoTRIgine 200 MG TB24 24 hour tablet Take two tablets at bedtime. 180 tablet 3  . loratadine (CLARITIN) 10 MG tablet Take 1 tablet (10 mg total) by mouth daily. 90 tablet 3  . Melatonin 3 MG TABS Take 9 mg by mouth.     . Multiple Vitamins-Minerals (MULTIVITAMIN WITH MINERALS) tablet Take 1 tablet by mouth daily. Reported on 06/16/2015    . nitroGLYCERIN (NITROSTAT) 0.4 MG SL tablet Place 1 tablet (0.4 mg total) under the tongue every 5 (five) minutes  as needed for chest pain. (may repeat every 5 minutes but seek medical help if pain persists after 3 tablets) 25 tablet 0  . ondansetron (ZOFRAN-ODT) 4 MG disintegrating tablet DISSOLVE 1 TABLET IN MOUTH EVERY 8 HOURS AS NEEDED FOR NAUSEA OR VOMITING 20 tablet 0  . oxyCODONE (OXY IR/ROXICODONE) 5 MG immediate release tablet Take 1 tablet (5 mg total) by mouth every 6 (six) hours as needed for moderate pain. 20 tablet 0  . promethazine (PHENERGAN) 25 MG tablet TAKE 1 TABLET BY MOUTH EVERY 8 HOURS AS NEEDED FOR NAUSEA OR VOMITING 30 tablet 2  . sertraline (ZOLOFT) 50 MG tablet Take 1 tablet (50 mg total) by mouth daily. 30 tablet 3  . telmisartan (MICARDIS) 40 MG tablet Take 40 mg by mouth daily.  1  . triamcinolone cream (KENALOG) 0.1 % APPLY TOPICALLY TWO TIMES DAILY 30 g 0  . vitamin B-12 (CYANOCOBALAMIN) 500 MCG tablet Take by mouth.     No current facility-administered medications for this visit.     OBJECTIVE: There were no vitals filed for this visit.   There is no height or weight on file to calculate BMI.    ECOG FS:0 - Asymptomatic  General:  Well-developed, well-nourished, no acute distress. Eyes: Pink conjunctiva, anicteric sclera. HEENT: Normocephalic, moist mucous membranes, clear oropharnyx. Lungs: Clear to auscultation bilaterally. Heart: Regular rate and rhythm. No rubs, murmurs, or gallops. Abdomen: Soft, nontender, nondistended. No organomegaly noted, normoactive bowel sounds. Musculoskeletal: No edema, cyanosis, or clubbing. Neuro: Alert, answering all questions appropriately. Cranial nerves grossly intact. Skin: No rashes or petechiae noted. Psych: Normal affect. Lymphatics: No cervical, calvicular, axillary or inguinal LAD.   LAB RESULTS:  Lab Results  Component Value Date   NA 142 12/04/2017   K 3.9 12/04/2017   CL 110 12/04/2017   CO2 27 12/04/2017   GLUCOSE 94 12/04/2017   BUN 18 12/04/2017   CREATININE 0.82 12/04/2017   CALCIUM 9.0 12/04/2017   PROT 6.6 12/04/2017   ALBUMIN 3.9 12/04/2017   AST 21 12/04/2017   ALT 22 12/04/2017   ALKPHOS 81 12/04/2017   BILITOT 0.5 12/04/2017   GFRNONAA >60 06/24/2017   GFRAA >60 06/24/2017    Lab Results  Component Value Date   WBC 3.6 (L) 12/04/2017   NEUTROABS 1.7 09/29/2017   HGB 14.2 12/04/2017   HCT 42.2 12/04/2017   MCV 94.0 12/04/2017   PLT 180.0 12/04/2017     STUDIES: Mr Abdomen Wwo Contrast  Result Date: 12/18/2017 CLINICAL DATA:  Patient with indeterminate liver lesion. History of renal cell carcinoma status post left nephrectomy in 2009. EXAM: MRI ABDOMEN WITHOUT AND WITH CONTRAST TECHNIQUE: Multiplanar multisequence MR imaging of the abdomen was performed both before and after the administration of intravenous contrast. CONTRAST:  17m MULTIHANCE GADOBENATE DIMEGLUMINE 529 MG/ML IV SOLN COMPARISON:  MRI abdomen pelvis 06/03/2017; CT abdomen pelvis 05/16/2017 FINDINGS: Lower chest: Unremarkable. Hepatobiliary: Liver is normal in size and contour. Stable size of subcapsular 10 mm lesion segment 8 (image 22; series 13) without definitive  intrinsic T1 hyperintensity. Decreased T2 hyperintensity when compared to prior exam. Status post cholecystectomy. No intrahepatic or extrahepatic biliary ductal dilatation. Pancreas:  Unremarkable Spleen:  Unremarkable Adrenals/Urinary Tract: The adrenal glands are normal. Status post left nephrectomy. No abnormal soft tissue within the nephrectomy bed. Normal right kidney. Stomach/Bowel: Normal morphology of the stomach. No evidence for bowel obstruction. No free fluid. Vascular/Lymphatic: Normal caliber abdominal aorta. No retroperitoneal lymphadenopathy. Other:  None. Musculoskeletal: No aggressive or acute  appearing osseous lesions. IMPRESSION: Similar-appearing nonenhancing subcapsular lesion segment 8 of the liver. This is not completely definitive/characterized however may represent a mildly complicated cyst. Consider follow-up MRI in 6-12 months to ensure stability. Electronically Signed   By: Lovey Newcomer M.D.   On: 12/18/2017 13:05    ASSESSMENT: Leukopenia.  PLAN:    1.  Leukopenia: Resolved.  Patient's white blood cell count is within normal limits today.  All of her other laboratory work including ferritin, iron panel, folate, and B12 levels are also within normal limits.  Neutrophil antibodies as well as peripheral blood flow cytometry have been drawn and are pending at time of dictation.  No intervention is needed at this time.  Patient does not require bone marrow biopsy.  Return to clinic in 3 months with repeat laboratory work and further evaluation.  If her white count continues to be within normal limits at that time, she likely can be discharged from clinic.  I spent a total of 45 minutes face-to-face with the patient of which greater than 50% of the visit was spent in counseling and coordination of care as detailed above.  Patient expressed understanding and was in agreement with this plan. She also understands that She can call clinic at any time with any questions, concerns, or  complaints.    Lloyd Huger, MD   01/04/2018 10:54 PM

## 2018-01-05 ENCOUNTER — Telehealth: Payer: Self-pay | Admitting: Neurology

## 2018-01-05 ENCOUNTER — Inpatient Hospital Stay: Payer: PPO | Admitting: Oncology

## 2018-01-05 ENCOUNTER — Inpatient Hospital Stay: Payer: PPO

## 2018-01-05 NOTE — Telephone Encounter (Addendum)
Reports that she had a passing out event on 01/04/18.  She last remembers being in her bathroom around 6pm.  The next thing she remembers is waking up, in the floor, around 6:30pm.  She denies missing any doses of Lamictal prior to his episode.  She did not have a definite injury but says she has kept a mild headache since that time.     She had a pending appt with Carolyn,NP on 02/16/18.  Dr. Krista Blue would like her seen earlier.  She was placed in an available slot with Hoyle Sauer on 01/08/18 at 11:15am.  She was instructed to arrive at 10:45am for check-in.

## 2018-01-05 NOTE — Telephone Encounter (Signed)
Patient called to advise she passed out yesterday evening. Please call and discuss.

## 2018-01-06 NOTE — Progress Notes (Signed)
[facility]  PATIENT: Denise Davis DOB: 27-Aug-1965   REASON FOR VISIT: Follow-up for seizure disorder recent fall , headache HISTORY FROM: Patient alone at visit    HISTORY OF PRESENT ILLNESS: 08/07/17 Denise Davis is a 52 year old female, seen in refer by her primary care doctor Leone Haven, for evaluation of possible seizure, initial evaluation was on May 08, 2017.  She had a past medical history of hypertension, hyperlipidemia, renal cell carcinoma, s/p left nephrectomy in 2009, it was a incidental finding, does not need chemoradiation therapy.  She began to have seizures since 2015, she described as generalized tonic-clonic seizure, when she came around, she was confused, she had a total of 4-5 episode in the first 12 months, was put on Keppra 500 twice daily, she has no recurrent seizure activity, she stopped the [place] since 2018.  Then she began to have recurrent spells of sudden onset energy surge in her brain, transient dizziness, vertigo, mild nausea, lasting for a few seconds, she can multiple recurrent episode in the day, no loss of consciousness,  This is in the setting of extreme stress, her husband passed away in summer 2018.  She also experienced similar transient recurrent episode prior to her seizure onset in 2015,  She is also on polypharmacy treatment for her depression  UPDATE May 9 2019YY She was started on titrating dose of lamotrigine since her initial visit in February 2019 for probable seizure, did have recurrent spells at the end of March 2019 while she was sitting at the edge of the bathtub talking with her sister, she felt her mouth was tingling, lightheaded, next thing she remembers she was in the bathtub, her sister was standing over her, she was confused, there was no body shaking seizure-like activity described.  This is the first episode over the past 1 year that she actually loss of consciousness, but over the  years, she has frequent spells of numbness tingling around her mouth, sometimes on a daily basis, with starting lamotrigine 100 mg twice a day, she has much less spells, only occasionally,shorter lasting, EEG was normal in February 2019 MRI of brain with without contrast on May 14, 2017 was normal History of left kidney cancer, status post left nephrectomy, MRI of abdomen pelvic in March 2019 9 mm subcapsular lesion faint T2 hyperintensity, without T1 hyperintensity, could reflect a benign hemangioma, Laboratory evaluation April 2019, showed normal CBC, CMP, negative RPR  UPDATE 10/10/2019CM Denise Davis, 52 year old female returns for follow-up with history of seizure disorder with last seizure occurring on 01/04/2018.  Patient states she  passing out and  the next thing she remembers is waking up on the floor around 6:30 at night.  She denies missing any doses of her Lamictal.  Patient claims she had her head and has had a headache since that time she rates it on a scale of 1-10 is 8 out of 10.  She has  taken Tylenol without relief.  She returns for reevaluation  REVIEW OF SYSTEMS: Full 14 system review of systems performed and notable only for those listed, all others are neg:  Constitutional: Continued Cardiovascular: neg Ear/Nose/Throat: neg  Skin: neg Eyes: Light sensitivity Respiratory: neg Gastroitestinal: neg  Hematology/Lymphatic: neg  Endocrine: neg Musculoskeletal:neg Allergy/Immunology: neg Neurological: Seizure-like events, passing out, headache Psychiatric: neg Sleep : neg   ALLERGIES: Allergies  Allergen Reactions  . Lisinopril Cough    HOME MEDICATIONS: Outpatient Medications Prior to Visit  Medication Sig Dispense Refill  .  acarbose (PRECOSE) 25 MG tablet Take 25 mg by mouth 3 (three) times daily with meals.    Marland Kitchen albuterol (VENTOLIN HFA) 108 (90 Base) MCG/ACT inhaler Inhale 2 puffs into the lungs every 6 (six) hours as needed for wheezing or shortness of  breath. 3 Inhaler 1  . amoxicillin-clavulanate (AUGMENTIN) 875-125 MG tablet Take 1 tablet by mouth. For dental    . aspirin 81 MG chewable tablet Chew 81 mg by mouth daily.    . budesonide-formoterol (SYMBICORT) 160-4.5 MCG/ACT inhaler Inhale 2 puffs into the lungs 2 (two) times daily. Rinse mouth after use 1 Inhaler 12  . butalbital-acetaminophen-caffeine (FIORICET, ESGIC) 50-325-40 MG tablet Take 1 tablet by mouth every 4 (four) hours as needed for headache. 20 tablet 0  . calcium-vitamin D 250-100 MG-UNIT tablet Take 1 tablet by mouth 2 (two) times daily. Reported on 06/16/2015 180 tablet 3  . carvedilol (COREG) 12.5 MG tablet TAKE 1 TABLET BY MOUTH TWICE DAILY WITH MEALS 180 tablet 1  . Cholecalciferol (VITAMIN D-3) 1000 units CAPS Take 1 capsule (1,000 Units total) by mouth daily. Reported on 06/16/2015 90 capsule 3  . clotrimazole-betamethasone (LOTRISONE) cream Apply 1 application topically 2 (two) times daily. 30 g 0  . DEXILANT 60 MG capsule TAKE 1 CAPSULE BY MOUTH ONCE DAILY 90 capsule 0  . DULoxetine (CYMBALTA) 30 MG capsule Take 1 capsule (30 mg total) by mouth daily. 30 capsule 1  . fexofenadine (ALLEGRA ALLERGY) 180 MG tablet Take 1 tablet (180 mg total) by mouth daily. 30 tablet 2  . fluticasone (FLONASE) 50 MCG/ACT nasal spray Place 2 sprays into both nostrils daily. 16 g 6  . hydrOXYzine (VISTARIL) 50 MG capsule Take 1 capsule (50 mg total) by mouth 3 (three) times daily as needed for itching. 30 capsule 2  . LamoTRIgine 200 MG TB24 24 hour tablet Take two tablets at bedtime. 180 tablet 3  . loratadine (CLARITIN) 10 MG tablet Take 1 tablet (10 mg total) by mouth daily. 90 tablet 3  . Melatonin 3 MG TABS Take 9 mg by mouth.     . montelukast (SINGULAIR) 10 MG tablet Take 1 tablet (10 mg total) by mouth at bedtime. 30 tablet 3  . Multiple Vitamins-Minerals (MULTIVITAMIN WITH MINERALS) tablet Take 1 tablet by mouth daily. Reported on 06/16/2015    . nitroGLYCERIN (NITROSTAT) 0.4 MG SL  tablet Place 1 tablet (0.4 mg total) under the tongue every 5 (five) minutes as needed for chest pain. (may repeat every 5 minutes but seek medical help if pain persists after 3 tablets) 25 tablet 0  . ondansetron (ZOFRAN-ODT) 4 MG disintegrating tablet DISSOLVE 1 TABLET IN MOUTH EVERY 8 HOURS AS NEEDED FOR NAUSEA OR VOMITING 20 tablet 0  . oxyCODONE (OXY IR/ROXICODONE) 5 MG immediate release tablet Take 1 tablet (5 mg total) by mouth every 6 (six) hours as needed for moderate pain. 20 tablet 0  . promethazine (PHENERGAN) 25 MG tablet TAKE 1 TABLET BY MOUTH EVERY 8 HOURS AS NEEDED FOR NAUSEA OR VOMITING 30 tablet 2  . sertraline (ZOLOFT) 50 MG tablet Take 1 tablet (50 mg total) by mouth daily. 30 tablet 3  . telmisartan (MICARDIS) 40 MG tablet Take 40 mg by mouth daily.  1  . triamcinolone cream (KENALOG) 0.1 % APPLY TOPICALLY TWO TIMES DAILY 30 g 0  . vitamin B-12 (CYANOCOBALAMIN) 500 MCG tablet Take by mouth.     No facility-administered medications prior to visit.     PAST MEDICAL HISTORY: Past Medical  History:  Diagnosis Date  . Allergic rhinitis   . Anxiety   . Depression   . Diabetes mellitus    taken off meds in beginning fo 2018 dueto weight loss   . DVT of leg (deep venous thrombosis) ([employer]) 12/2007   left leg, given warfarin for 6 mos  . GERD (gastroesophageal reflux disease)   . H/O hiatal hernia   . Hyperlipidemia   . Hypertension   . Morbid obesity ([place])   . Pericarditis    Diagnosed/treated at [facility]; per patient 4 episodes per cardiologist Dr. Edwin Dada persistant chest pain unclear if pericarditis verusus neuropsychogenic   . Renal cell cancer ([place]) 2009   S/p nephrectomy (left)  . Seizure disorder ([place])    (04/08/17)- None in over 1 yr, no meds.    PAST SURGICAL HISTORY: Past Surgical History:  Procedure Laterality Date  . CHOLECYSTECTOMY N/A 05/02/2017   Procedure: LAPAROSCOPIC CHOLECYSTECTOMY WITH INTRAOPERATIVE CHOLANGIOGRAM;  Surgeon: Johnathan Hausen, MD;  Location:  [facility];  Service: General;  Laterality: N/A;  . ENDOMETRIAL ABLATION  2007  . ESOPHAGEAL MANOMETRY N/A 05/21/2016   Procedure: ESOPHAGEAL MANOMETRY (EM);  Surgeon: Lucilla Lame, MD;  Location: [facility] ENDOSCOPY;  Service: Endoscopy;  Laterality: N/A;  . ESOPHAGOGASTRODUODENOSCOPY (EGD) WITH PROPOFOL N/A 05/14/2016   Procedure: ESOPHAGOGASTRODUODENOSCOPY (EGD) WITH PROPOFOL;  Surgeon: Lucilla Lame, MD;  Location: [facility] ENDOSCOPY;  Service: Endoscopy;  Laterality: N/A;  . ESOPHAGOGASTRODUODENOSCOPY (EGD) WITH PROPOFOL N/A 04/10/2017   Procedure: ESOPHAGOGASTRODUODENOSCOPY (EGD) WITH PROPOFOL;  Surgeon: Lucilla Lame, MD;  Location: [facility];  Service: Endoscopy;  Laterality: N/A;  . INSERTION OF MESH  2014  . LEFT HEART CATHETERIZATION WITH CORONARY ANGIOGRAM N/A 08/30/2013   Procedure: LEFT HEART CATHETERIZATION WITH CORONARY ANGIOGRAM;  Surgeon: Troy Sine, MD;  Location: [facility] C[facility] LAB;  Service: Cardiovascular;  Laterality: N/A;  . NEPHRECTOMY    . REPLACEMENT TOTAL KNEE BILATERAL Bilateral 2018   2/18, 5/18 [facility]  . ROUX-EN-Y GASTRIC BYPASS  04/27/2015   [facility]  . TUBAL LIGATION    . UMBILICAL HERNIA REPAIR      FAMILY HISTORY: Family History  Problem Relation Age of Onset  . Hypertension Mother   . Breast cancer Mother 59  . Arthritis Mother   . Heart disease Paternal Grandmother   . Alcoholism Father   . Hyperlipidemia Unknown        Parent  . Mental illness Unknown        Parent  . Diabetes Unknown        Parent    SOCIAL HISTORY: Social History   Socioeconomic History  . Marital status: Widowed    Spouse name: Not on file  . Number of children: 2  . Years of education: 100  . Highest education level: High school graduate  Occupational History  . Occupation: Disabled  Social Needs  . Financial resource strain: Not on file  . Food insecurity:    Worry: Not on file    Inability: Not on file  . Transportation needs:    Medical: Not on file    Non-medical: Not on  file  Tobacco Use  . Smoking status: Former Smoker    Types: Cigarettes    Last attempt to quit: 1984    Years since quitting: 35.7  . Smokeless tobacco: Never Used  Substance and Sexual Activity  . Alcohol use: No    Frequency: Never  . Drug use: No  . Sexual activity: Not Currently  Lifestyle  . Physical activity:  Days per week: Not on file    Minutes per session: Not on file  . Stress: Not on file  Relationships  . Social connections:    Talks on phone: Not on file    Gets together: Not on file    Attends religious service: Not on file    Active member of club or organization: Not on file    Attends meetings of clubs or organizations: Not on file    Relationship status: Not on file  . Intimate partner violence:    Fear of current or ex partner: Not on file    Emotionally abused: Not on file    Physically abused: Not on file    Forced sexual activity: Not on file  Other Topics Concern  . 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   Right-handed.   One cup caffeine per day.           PHYSICAL EXAM  Vitals:   01/08/18 1102  BP: 128/88  Pulse: 85  Weight: 209 lb 3.2 oz (94.9 kg)  Height: 5\' 5"  (1.651 m)   Body mass index is 34.81 kg/m.  Generalized: Well developed, in no acute distress  Head: normocephalic and atraumatic,. Oropharynx benign  Neck: Supple,  Musculoskeletal: No deformity   Neurological examination   Mentation: Alert oriented to time, place, history taking. Attention span and concentration appropriate. Recent and remote memory intact.  Follows all commands speech and language fluent.   Cranial nerve II-XII: Pupils were equal round reactive to light extraocular movements were full, visual field were full on confrontational test. Facial sensation and strength were normal. hearing was intact to finger  rubbing bilaterally. Uvula tongue midline. head turning and shoulder shrug were normal and symmetric.Tongue protrusion into cheek strength was normal. Motor: normal bulk and tone, full strength in the BUE, BLE,  Sensory: normal and symmetric to light touch, pinprick, and  Vibration, in the upper and lower extremities  Coordination: finger-nose-finger, heel-to-shin bilaterally, no dysmetria Reflexes: Brachioradialis 2/2, biceps 2/2, triceps 2/2, patellar 2/2, Achilles 2/2, plantar responses were flexor bilaterally. Gait and Station: Rising up from seated position without assistance, normal stance,  moderate stride, good arm swing, smooth turning, able to perform tiptoe, and heel walking without difficulty. Tandem gait is steady  DIAGNOSTIC DATA (LABS, IMAGING, TESTING) - I reviewed patient records, labs, notes, testing and imaging myself where available.  Lab Results  Component Value Date   WBC 3.6 (L) 12/04/2017   HGB 14.2 12/04/2017   HCT 42.2 12/04/2017   MCV 94.0 12/04/2017   PLT 180.0 12/04/2017      Component Value Date/Time   NA 142 12/04/2017 1404   NA 139 04/18/2014 1853   K 3.9 12/04/2017 1404   K 3.8 04/18/2014 1853   CL 110 12/04/2017 1404   CL 101 04/18/2014 1853   CO2 27 12/04/2017 1404   CO2 29 04/18/2014 1853   GLUCOSE 94 12/04/2017 1404   GLUCOSE 113 (H) 04/18/2014 1853   BUN 18 12/04/2017 1404   BUN 15 04/18/2014 1853   CREATININE 0.82 12/04/2017 1404   CREATININE 0.82 09/12/2017 1634   CALCIUM 9.0 12/04/2017 1404   CALCIUM 9.0 04/18/2014 1853   PROT 6.6 12/04/2017 1404   PROT 7.8 04/18/2014 1853   ALBUMIN 3.9 12/04/2017 1404   ALBUMIN 3.3 (L) 04/18/2014 1853  AST 21 12/04/2017 1404   AST 23 04/18/2014 1853   ALT 22 12/04/2017 1404   ALT 23 04/18/2014 1853   ALKPHOS 81 12/04/2017 1404   ALKPHOS 112 04/18/2014 1853   BILITOT 0.5 12/04/2017 1404   BILITOT 0.2 04/18/2014 1853   GFRNONAA >60 06/24/2017 0321   GFRNONAA 52 (L) 04/18/2014 1853   GFRNONAA  >60 12/01/2013 0846   GFRAA >60 06/24/2017 0321   GFRAA >60 04/18/2014 1853   GFRAA >60 12/01/2013 0846   Lab Results  Component Value Date   CHOL 260 (H) 08/30/2013   HDL 41 08/30/2013   LDLCALC 167 (H) 08/30/2013   TRIG 260 (H) 08/30/2013   CHOLHDL 6.3 08/30/2013   Lab Results  Component Value Date   HGBA1C 5.8 (A) 09/12/2017   Lab Results  Component Value Date   VITAMINB12 303 12/04/2017   Lab Results  Component Value Date   TSH 1.25 12/04/2017      ASSESSMENT AND PLAN Denise Davis is a 52 y.o. female with probable complex partial seizure disorder secondary generalization often preceded by numbness and tingling around the mouth.  Most recent passing out episode October 6 in the bathroom where she hit her head now has headache that will not go away.   She is currently on Lamictal extended release 400 mg daily around 9:00 pm MRI of brain, and the EEG was normal               PLAN: Continue Lamictal extended release 200 mg 2 tabs at bedtime We will check level today CT of the head due to recent fall and headache No driving until seizure-free for 6 months F/U in 6 months Dennie Bible, [facility], [facility], [facility] [address], [place], [place] [address] [phone]

## 2018-01-07 ENCOUNTER — Ambulatory Visit (INDEPENDENT_AMBULATORY_CARE_PROVIDER_SITE_OTHER): Payer: PPO | Admitting: Family Medicine

## 2018-01-07 ENCOUNTER — Encounter: Payer: Self-pay | Admitting: Family Medicine

## 2018-01-07 VITALS — BP 122/84 | HR 85 | Temp 99.3°F | Ht 65.0 in | Wt 205.8 lb

## 2018-01-07 DIAGNOSIS — J309 Allergic rhinitis, unspecified: Secondary | ICD-10-CM

## 2018-01-07 DIAGNOSIS — L299 Pruritus, unspecified: Secondary | ICD-10-CM | POA: Diagnosis not present

## 2018-01-07 MED ORDER — HYDROXYZINE PAMOATE 50 MG PO CAPS: 50.0000 mg | ORAL_CAPSULE | Freq: Three times a day (TID) | ORAL | 2 refills | Status: DC | PRN

## 2018-01-07 MED ORDER — MONTELUKAST SODIUM 10 MG PO TABS: 10.0000 mg | ORAL_TABLET | Freq: Every day | ORAL | 3 refills | Status: DC

## 2018-01-07 NOTE — Progress Notes (Signed)
Subjective:    Patient ID: Denise Davis, female    DOB: 1965/11/07, 52 y.o.   MRN: [id number]  HPI   Patient presents to clinic with itching all over body for couple of days, noticed a faint red rash mainly on chest and on her back when she looked in mirror.  States she has had a similar experience of itching all over her back in May and was given Vistaril which worked very well for her.  Patient denies any new soaps, lotions, detergents.  Patient denies any fever or chills.  Patient Active Problem List   Diagnosis Date Noted  . Generalized pain 12/05/2017  . Decreased appetite 12/05/2017  . Weakness generalized 12/05/2017  . Fatigue 12/05/2017  . Leukopenia 09/28/2017  . Passed out 08/07/2017  . Recurrent major depressive disorder, in remission ([place]) 07/23/2017  . Renal cell carcinoma ([place]) 07/23/2017  . Itching 06/27/2017  . Pain of toe of right foot 06/27/2017  . Headache 06/27/2017  . Seizure-like activity ([place]) 05/08/2017  . S/P laparoscopic cholecystectomy 05/02/2017  . BPPV (benign paroxysmal positional vertigo), right 01/24/2017  . SOB (shortness of breath) 12/18/2016  . Esophageal spasm 05/30/2016  . Nausea   . Bariatric surgery status   . Other specified diseases of esophagus   . Gout 03/19/2016  . Neuropathy 02/12/2016  . Decreased pedal pulses 02/12/2016  . Allergic rhinitis 02/12/2016  . Hypokalemia 12/08/2015  . Sleep apnea 12/01/2015  . Palpitations 12/01/2015  . S/P gastric bypass 10/03/2015  . Osteoarthritis 10/03/2015  . Cannabis use disorder, moderate, in sustained remission ([place]) 08/18/2015  . Post traumatic stress disorder (PTSD) 08/18/2015  . Marginal ulcer 05/31/2015  . Anxiety and depression 02/13/2015  . Seizure disorder ([place]) 02/13/2015  . GERD (gastroesophageal reflux disease) 02/13/2015  . Chronic respiratory failure ([place]) 08/28/2013  . Right sided weakness 08/25/2013  . Concussion 07/08/2013  . Insomnia 07/08/2013  . Otitis media  07/08/2013  . Zoster without complications 40/98/1191  . Pneumonia 04/29/2013  . Moderate episode of recurrent major depressive disorder ([place]) 10/27/2012  . Dysphonia 09/04/2012  . Vocal cord palsy 08/25/2012  . Vocal cord dysfunction 08/18/2012  . Morbid (severe) obesity due to excess calories ([place]) 08/11/2012  . Hypoglycemia 04/23/2012  . Other postprocedural status(V45.89) 02/11/2012  . Pain of lower extremity 01/01/2012  . Muscle pain 12/03/2011  . Achilles bursitis or tendinitis 08/06/2011  . Onychomycosis due to dermatophyte 08/06/2011  . Paronychia of toe 08/06/2011  . Syndrome affecting cervical region 05/17/2011  . Personal history of other venous thrombosis and embolism 05/15/2011  . Chest pain 05/09/2011  . Diabetes mellitus ([place])   . Hypertension   . Dizziness 08/22/2009  . Skin sensation disturbance 08/18/2009  . Visual disturbance 08/18/2009  . Umbilical hernia without obstruction or gangrene 09/28/2008  . Hyperlipidemia 09/07/2008  . Malignant neoplasm of kidney excluding renal pelvis ([place]) 09/07/2008   Social History   Tobacco Use  . Smoking status: Former Smoker    Types: Cigarettes    Last attempt to quit: 1984    Years since quitting: 35.7  . Smokeless tobacco: Never Used  Substance Use Topics  . Alcohol use: No    Frequency: Never   Review of Systems  Constitutional: Negative for chills, fatigue and fever.  HENT: Negative for congestion, ear pain, sinus pain and sore throat.   Eyes: Negative.   Respiratory: Negative for cough, shortness of breath and wheezing.   Cardiovascular: Negative for chest pain, palpitations and leg swelling.  Gastrointestinal: Negative for abdominal pain, diarrhea, nausea and vomiting.  Genitourinary: Negative for dysuria, frequency and urgency.  Musculoskeletal: Negative for arthralgias and myalgias.  Skin: +rash and itch Neurological: Negative for syncope, light-headedness and headaches.  Psychiatric/Behavioral: The  patient is not nervous/anxious.       Objective:   Physical Exam  Constitutional: She appears well-developed and well-nourished. No distress.  Head: Normocephalic and atraumatic.  Eyes:  EOM are normal. No scleral icterus.  Neck: Normal range of motion. Neck supple. No tracheal deviation present.  Cardiovascular: Normal rate, regular rhythm and normal heart sounds.  Pulmonary/Chest: Effort normal and breath sounds normal. No respiratory distress.  Neurological: She is alert and oriented to person, place, and time.  Gait normal  Skin: Skin is warm and dry. No pallor. Very faint red raised rash across chest.  Psychiatric: She has a normal mood and affect. Her behavior is normal. Thought content normal.    Nursing note and vitals reviewed.    Vitals:   01/07/18 0934  BP: 122/84  Pulse: 85  Temp: 99.3 F (37.4 C)  SpO2: 99%    Assessment & Plan:    Pruritic eruption- patient will use Vistaril as needed for itching.  Rash itself has faded per patient report, so I do not feel she needs to take any sort of steroid or use steroid cream.  Advised to use mild unscented soap to cleanse skin to avoid any irritations that could be from skin products. She will continue daily claritin and we will add singulair 10mg  once daily.  Keep regularly scheduled follow-up as planned; return to clinic sooner if issues arise.

## 2018-01-08 ENCOUNTER — Telehealth: Payer: Self-pay | Admitting: Nurse Practitioner

## 2018-01-08 ENCOUNTER — Ambulatory Visit (INDEPENDENT_AMBULATORY_CARE_PROVIDER_SITE_OTHER): Payer: PPO | Admitting: Nurse Practitioner

## 2018-01-08 ENCOUNTER — Encounter: Payer: Self-pay | Admitting: Nurse Practitioner

## 2018-01-08 VITALS — BP 128/88 | HR 85 | Ht 65.0 in | Wt 209.2 lb

## 2018-01-08 DIAGNOSIS — G44311 Acute post-traumatic headache, intractable: Secondary | ICD-10-CM

## 2018-01-08 DIAGNOSIS — R569 Unspecified convulsions: Secondary | ICD-10-CM

## 2018-01-08 DIAGNOSIS — Z9181 History of falling: Secondary | ICD-10-CM | POA: Insufficient documentation

## 2018-01-08 DIAGNOSIS — R519 Headache, unspecified: Secondary | ICD-10-CM | POA: Insufficient documentation

## 2018-01-08 DIAGNOSIS — R51 Headache: Secondary | ICD-10-CM

## 2018-01-08 NOTE — Telephone Encounter (Signed)
health team no auth order sent to GI pt is going to GI as a walk in.

## 2018-01-08 NOTE — Telephone Encounter (Signed)
Patient called me and informed me she does not want to go to GI and that she wants to go to [facility]. The first available they had is 01/14/18 arrival time is 3:45 pm patient is aware of time & day.

## 2018-01-08 NOTE — Patient Instructions (Signed)
Continue Lamictal extended release 200 mg 2 tabs at bedtime We will check level today CT of the head due to recent fall and headache F/U in 6 months

## 2018-01-09 ENCOUNTER — Telehealth: Payer: Self-pay | Admitting: Nurse Practitioner

## 2018-01-09 MED ORDER — BUTALBITAL-APAP-CAFFEINE 50-325-40 MG PO TABS: 1.0000 | ORAL_TABLET | Freq: Four times a day (QID) | ORAL | 1 refills | Status: DC | PRN

## 2018-01-09 NOTE — Addendum Note (Signed)
Addended by: Marcial Pacas on: 01/09/2018 01:07 PM   Modules accepted: Orders

## 2018-01-09 NOTE — Telephone Encounter (Signed)
Spoke to pt and relayed that fiorcet was order for her to try.  Proceed with CT. She has phenergan prescriptin already.  She just needed it refilled by pharmacy.  She will call them.

## 2018-01-09 NOTE — Telephone Encounter (Signed)
I spoke to the patient she told me yesterday that she wanted to go to [facility] even though she could of gone to [facility] as a walk in yesterday when I talked to her before she left our office. I told her the 16th was the first available that they had and I could only get what was the first available. She does have their number and is going to call them if they have any cancellation.

## 2018-01-09 NOTE — Telephone Encounter (Signed)
Still proceed with CT head as previously arranged,   I have called in Fioricet 1 tabs as needed, 12 tabs in one month,

## 2018-01-09 NOTE — Telephone Encounter (Signed)
Pt has had constant headache since she fell and hit her head.  CM/NP saw her yesterday.  Ordered CT.  She states she never really had headaches before.  Location is R side to back of head.  Level 8, light noise bother her, she had nausea but has this all time (anyway).  When she lies down she is ok, but standing she gets light headed.  Takes OTC tylenol, not helping.  Not sure if migraine related.

## 2018-01-09 NOTE — Telephone Encounter (Signed)
Please ask following questions for headache,  Location, severity, are there light, noise sensitivity? Nauseous?  frequency, trigger, how long it last, what medications have tried,   If there are migraine feature, we can give her imitrex 50 mg as needed.

## 2018-01-09 NOTE — Telephone Encounter (Signed)
Pt has called stating that she thought NP Hoyle Sauer was going to call in something for her headache.  Pt states she has been taking what she has but it has not helped.  Pt uses  [facility] [address], [place] [phone] (Phone) [phone] (Fax)

## 2018-01-09 NOTE — Progress Notes (Signed)
I have reviewed and agreed above plan. 

## 2018-01-09 NOTE — Telephone Encounter (Signed)
Spoke to pt and and she states that she has gone to hospital usually for her tests, scans etc.  Asking about medication for her headache, but I believe CM wanting CT first.  Will ask emily in MRI auth.

## 2018-01-10 LAB — LAMOTRIGINE LEVEL: Lamotrigine Lvl: 6.2 ug/mL (ref 2.0–20.0)

## 2018-01-12 ENCOUNTER — Telehealth: Payer: Self-pay

## 2018-01-12 NOTE — Telephone Encounter (Signed)
-----   Message from Lester [place], RN sent at 01/12/2018 11:32 AM EDT -----   ----- Message ----- From: Dennie Bible, NP Sent: 01/12/2018   9:26 AM EDT To: Cyril Mourning Dinkins, RN  Good level of Lamictal continue same dose

## 2018-01-12 NOTE — Telephone Encounter (Signed)
Spoke with the patient and she verbalized understanding her results. No questions or concerns at this time.   

## 2018-01-13 ENCOUNTER — Telehealth: Payer: Self-pay

## 2018-01-13 NOTE — Telephone Encounter (Signed)
Called Pt back to let her know someone probably called her number by mistake, that's why no message was left. I couldn't see anything recent in her chart from our office. Pt stated understanding.

## 2018-01-13 NOTE — Telephone Encounter (Signed)
Copied from [place] [id number]. Topic: General - Call Back - No Documentation >> Jan 13, 2018 11:01 AM Rutherford Nail, NT wrote: Reason for CRM: patient states that she received a phone call but does not know what it was regarding. Please advise.

## 2018-01-14 ENCOUNTER — Ambulatory Visit
Admission: RE | Admit: 2018-01-14 | Discharge: 2018-01-14 | Disposition: A | Discharge status: Home / Self Care | Payer: PPO | Source: Ambulatory Visit | Attending: Nurse Practitioner | Admitting: Nurse Practitioner

## 2018-01-14 DIAGNOSIS — Z9181 History of falling: Secondary | ICD-10-CM | POA: Diagnosis not present

## 2018-01-14 DIAGNOSIS — G44311 Acute post-traumatic headache, intractable: Secondary | ICD-10-CM | POA: Diagnosis not present

## 2018-01-14 DIAGNOSIS — R569 Unspecified convulsions: Secondary | ICD-10-CM | POA: Insufficient documentation

## 2018-01-14 DIAGNOSIS — R51 Headache: Secondary | ICD-10-CM | POA: Diagnosis not present

## 2018-01-15 ENCOUNTER — Telehealth: Payer: Self-pay | Admitting: *Deleted

## 2018-01-15 MED ORDER — SUMATRIPTAN SUCCINATE 50 MG PO TABS: 50.0000 mg | ORAL_TABLET | ORAL | 3 refills | Status: DC | PRN

## 2018-01-15 NOTE — Telephone Encounter (Signed)
I spoke to pt and relayed that Dr. Krista Blue and CM/NP would like her to have an Depacon infusion 500mg  IV tomorrow, will need to discuss with intrafusion to know of time and then after infusion will prescribe imitrex.  Pt informed and will need to call [phone] tomorrow am to see about time.  Relayed that MC/RN will be here to assist.  Pt verbalized understanding.

## 2018-01-15 NOTE — Telephone Encounter (Signed)
Discussed with Dr. Krista Blue she can come in tomorrow for Depacon 500 IV After infusion she can have Imitrex 50 to take acutely.  Dr. Krista Blue will be in the office tomorrow

## 2018-01-15 NOTE — Telephone Encounter (Signed)
-----   Message from Dennie Bible, NP sent at 01/15/2018 10:10 AM EDT ----- Negative CT of the head please call the patient

## 2018-01-15 NOTE — Telephone Encounter (Signed)
Spoke to pt and relayed that her CT head was negative which is good.  She states that she has continued with headaches that have not stopped. Daily, level about 8, when worse will take fiorcet ( Dr. Krista Blue prescribed last week) and that does help, but headache  comes back.  Light do not bother her now, she does have nausea.  Headaches get worse at the end of day.  She has problems sleeping but the headaches have made that worsen.  Please advise.

## 2018-01-16 NOTE — Telephone Encounter (Signed)
Patient received depacon 500 mg infusion today. Per tina RN she reported a 50% improvement in her headache after the infusion.

## 2018-01-19 MED ORDER — BUTALBITAL-APAP-CAFFEINE 50-325-40 MG PO TABS: 1.0000 | ORAL_TABLET | Freq: Four times a day (QID) | ORAL | 1 refills | Status: DC | PRN

## 2018-01-19 NOTE — Telephone Encounter (Signed)
Discussed with Dr. Krista Blue.  Patient is causing rebound by taking more sumatriptan and then ordered more than 2 a day.  We will refill her Fioricet which she is to take with Benadryl.  You  will need to discuss with your primary care about your chest discomfort shortness of breath and your inhaler.  May also try Aimovig as preventive which is a once a month subcu injection.  If you want to try this we will call this in for you as well the pharmacist can teach you how to inject herself

## 2018-01-19 NOTE — Telephone Encounter (Addendum)
Spoke with patient who stated her headache is back. She rates it 8/10 today. She has put up dark curtains in her room. She stated she did get 50% relief following the depacon infusion Fri., but after she got home it got worse agai. She stated she has developed SOB, tightness around her neck and chest today. She stated her inhaler is not helping her. This RN asked if she has been taking Sumatriptan.  She stated she took  2 on Sat,  3 tabs yesterday , and one today. This RN advised she is not to take more than 2 in 24 hours. She stated she was getting some relief from them but wanted to get better relief.   She has run out [employer] as of last week. She is taking Benadryl per Dr Krista Blue which does help her rest.  This RN advised she not take any more Sumatriptan today until a reply is received form Daun Peacock, NP or Dr Krista Blue.  The patient verbalized understanding.

## 2018-01-19 NOTE — Telephone Encounter (Signed)
Spoke with patient and informed her the NP discussed with Dr Krista Blue. Advised she may have caused a rebound headache by taking > 2 sumatriptan this weekend.  Advised her again not to take > 2 in 24 hours, no more than 8 in a month preferably.  Informed her that Fioricet is refilled , and she is to take it with Benadryl. Advised she call her PCP re: SOB, inhaler. Discussed Aimovig with her, and she would like it prescribed for her.  Informed her it will require a PA, which may delay her getting medication by a couple days. She had no questions, verbalized understanding, appreciation.

## 2018-01-19 NOTE — Addendum Note (Signed)
Addended by: Minna Antis on: 01/19/2018 01:56 PM   Modules accepted: Orders

## 2018-01-19 NOTE — Addendum Note (Signed)
Addended by: Otilio Jefferson on: 01/19/2018 04:21 PM   Modules accepted: Orders

## 2018-01-19 NOTE — Telephone Encounter (Signed)
Patient states infusion on 01-16-18 did not help headache. She called the all call doctor yesterday because of having a really bad headache but no response from the doctor.  She continues to have bad headache today. Please call and discuss.

## 2018-01-20 MED ORDER — ERENUMAB-AOOE 70 MG/ML ~~LOC~~ SOAJ: 70.0000 mg | SUBCUTANEOUS | 6 refills | Status: DC

## 2018-01-20 NOTE — Addendum Note (Signed)
Addended by: Otilio Jefferson on: 01/20/2018 09:07 AM   Modules accepted: Orders

## 2018-01-22 ENCOUNTER — Telehealth: Payer: Self-pay | Admitting: *Deleted

## 2018-01-22 NOTE — Telephone Encounter (Signed)
Initiated PA for aimvig 70mg  / ml.  Pt has tried sumatriptan, fircet, tylenol, motrin.  Dx O13.086.  CMM Key # [id number].

## 2018-01-23 ENCOUNTER — Telehealth: Payer: Self-pay

## 2018-01-23 NOTE — Telephone Encounter (Signed)
Fax has been sent to [facility]. Confirmation fax has been received.

## 2018-01-23 NOTE — Telephone Encounter (Signed)
Received a call from Yvone Neu from [place] wanting to clarify on her Aimovig PA. Yvone Neu wanted to know if [employer] would be appropriate for to take in place of the Aimovig which wouldn't require a PA. He stated that he would fax over a formulary sheet to look over. Call back number [phone]. Option 0,3 and 3. Case ID: [id number]

## 2018-01-26 MED ORDER — GALCANEZUMAB-GNLM 120 MG/ML ~~LOC~~ SOAJ: 120.0000 mg | SUBCUTANEOUS | 11 refills | Status: DC

## 2018-01-26 MED ORDER — GALCANEZUMAB-GNLM 120 MG/ML ~~LOC~~ SOAJ: 240.0000 mg | SUBCUTANEOUS | 0 refills | Status: DC

## 2018-01-26 NOTE — Telephone Encounter (Signed)
Spoke to [employer] [facility] [employer] about [place] for [employer] is formulary and should not require PA.

## 2018-01-26 NOTE — Addendum Note (Signed)
Addended by: Otilio Jefferson on: 01/26/2018 05:24 PM   Modules accepted: Orders

## 2018-01-26 NOTE — Addendum Note (Signed)
Addended by: Brandon Melnick on: 01/26/2018 03:37 PM   Modules accepted: Orders

## 2018-02-04 ENCOUNTER — Other Ambulatory Visit: Payer: Self-pay | Admitting: Family Medicine

## 2018-02-04 NOTE — Telephone Encounter (Signed)
Patient stated that after eating she is still becoming nauseated. She is taking zofran twice a day after meals.

## 2018-02-04 NOTE — Telephone Encounter (Signed)
Please contact the patient and see how often she requires this medication.  Please see what she takes it for.

## 2018-02-16 ENCOUNTER — Ambulatory Visit: Payer: PPO | Admitting: Nurse Practitioner

## 2018-02-28 ENCOUNTER — Other Ambulatory Visit: Payer: Self-pay | Admitting: Family Medicine

## 2018-02-28 DIAGNOSIS — F32A Depression, unspecified: Secondary | ICD-10-CM

## 2018-02-28 DIAGNOSIS — G8929 Other chronic pain: Secondary | ICD-10-CM

## 2018-02-28 DIAGNOSIS — F419 Anxiety disorder, unspecified: Secondary | ICD-10-CM

## 2018-02-28 DIAGNOSIS — F329 Major depressive disorder, single episode, unspecified: Secondary | ICD-10-CM

## 2018-03-17 ENCOUNTER — Other Ambulatory Visit: Payer: Self-pay | Admitting: Family Medicine

## 2018-03-17 DIAGNOSIS — R110 Nausea: Secondary | ICD-10-CM

## 2018-03-19 NOTE — Telephone Encounter (Signed)
Patient states that's he takes this daily because she stays nauseous. She is willing to see specialist. .

## 2018-03-19 NOTE — Telephone Encounter (Signed)
Please contact the patient and see why she continues to require this medication.  If she has continued to have nausea she will need to see a specialist for evaluation of this.

## 2018-04-06 DIAGNOSIS — E113393 Type 2 diabetes mellitus with moderate nonproliferative diabetic retinopathy without macular edema, bilateral: Secondary | ICD-10-CM | POA: Diagnosis not present

## 2018-04-22 ENCOUNTER — Ambulatory Visit: Payer: Self-pay | Admitting: Family Medicine

## 2018-04-22 ENCOUNTER — Ambulatory Visit: Payer: Self-pay

## 2018-05-01 ENCOUNTER — Other Ambulatory Visit: Payer: Self-pay

## 2018-05-01 DIAGNOSIS — R110 Nausea: Secondary | ICD-10-CM

## 2018-05-01 MED ORDER — PROMETHAZINE HCL 25 MG PO TABS: 25.0000 mg | ORAL_TABLET | Freq: Three times a day (TID) | ORAL | 3 refills | Status: DC | PRN

## 2018-05-01 MED ORDER — DEXLANSOPRAZOLE 60 MG PO CPDR: 1.0000 | DELAYED_RELEASE_CAPSULE | Freq: Every day | ORAL | 1 refills | Status: AC

## 2018-05-05 ENCOUNTER — Ambulatory Visit: Payer: Self-pay | Admitting: Gastroenterology

## 2018-05-18 ENCOUNTER — Other Ambulatory Visit: Payer: Self-pay | Admitting: Family Medicine

## 2018-05-18 ENCOUNTER — Other Ambulatory Visit: Payer: Self-pay | Admitting: Cardiovascular Disease

## 2018-06-15 ENCOUNTER — Ambulatory Visit: Payer: Self-pay | Admitting: Family Medicine

## 2018-06-15 ENCOUNTER — Ambulatory Visit: Payer: Self-pay

## 2018-06-16 ENCOUNTER — Observation Stay (HOSPITAL_COMMUNITY): Payer: PPO | Admitting: Anesthesiology

## 2018-06-16 ENCOUNTER — Emergency Department (HOSPITAL_COMMUNITY): Payer: PPO

## 2018-06-16 ENCOUNTER — Other Ambulatory Visit: Payer: Self-pay

## 2018-06-16 ENCOUNTER — Encounter (HOSPITAL_COMMUNITY): Payer: Self-pay | Admitting: Emergency Medicine

## 2018-06-16 ENCOUNTER — Encounter (HOSPITAL_COMMUNITY): Admission: EM | Disposition: A | Discharge status: Home / Self Care | Payer: Self-pay | Source: Home / Self Care

## 2018-06-16 ENCOUNTER — Inpatient Hospital Stay (HOSPITAL_COMMUNITY)
Admission: EM | Admit: 2018-06-16 | Discharge: 2018-06-25 | DRG: 337 | Disposition: A | Discharge status: Home / Self Care | Payer: PPO | Attending: Surgery | Admitting: Surgery

## 2018-06-16 DIAGNOSIS — I10 Essential (primary) hypertension: Secondary | ICD-10-CM | POA: Diagnosis present

## 2018-06-16 DIAGNOSIS — F329 Major depressive disorder, single episode, unspecified: Secondary | ICD-10-CM

## 2018-06-16 DIAGNOSIS — Z7951 Long term (current) use of inhaled steroids: Secondary | ICD-10-CM | POA: Diagnosis not present

## 2018-06-16 DIAGNOSIS — R100 Acute abdomen: Secondary | ICD-10-CM | POA: Diagnosis not present

## 2018-06-16 DIAGNOSIS — Z8261 Family history of arthritis: Secondary | ICD-10-CM | POA: Diagnosis not present

## 2018-06-16 DIAGNOSIS — K567 Ileus, unspecified: Secondary | ICD-10-CM | POA: Diagnosis not present

## 2018-06-16 DIAGNOSIS — Z79899 Other long term (current) drug therapy: Secondary | ICD-10-CM

## 2018-06-16 DIAGNOSIS — Z888 Allergy status to other drugs, medicaments and biological substances status: Secondary | ICD-10-CM

## 2018-06-16 DIAGNOSIS — Z86718 Personal history of other venous thrombosis and embolism: Secondary | ICD-10-CM | POA: Diagnosis not present

## 2018-06-16 DIAGNOSIS — Z803 Family history of malignant neoplasm of breast: Secondary | ICD-10-CM | POA: Diagnosis not present

## 2018-06-16 DIAGNOSIS — K56609 Unspecified intestinal obstruction, unspecified as to partial versus complete obstruction: Secondary | ICD-10-CM | POA: Diagnosis present

## 2018-06-16 DIAGNOSIS — E785 Hyperlipidemia, unspecified: Secondary | ICD-10-CM | POA: Diagnosis present

## 2018-06-16 DIAGNOSIS — Z7982 Long term (current) use of aspirin: Secondary | ICD-10-CM

## 2018-06-16 DIAGNOSIS — Z87891 Personal history of nicotine dependence: Secondary | ICD-10-CM | POA: Diagnosis not present

## 2018-06-16 DIAGNOSIS — F32A Depression, unspecified: Secondary | ICD-10-CM

## 2018-06-16 DIAGNOSIS — R1013 Epigastric pain: Secondary | ICD-10-CM

## 2018-06-16 DIAGNOSIS — K449 Diaphragmatic hernia without obstruction or gangrene: Secondary | ICD-10-CM | POA: Diagnosis present

## 2018-06-16 DIAGNOSIS — F419 Anxiety disorder, unspecified: Secondary | ICD-10-CM

## 2018-06-16 DIAGNOSIS — M109 Gout, unspecified: Secondary | ICD-10-CM | POA: Diagnosis present

## 2018-06-16 DIAGNOSIS — R109 Unspecified abdominal pain: Secondary | ICD-10-CM

## 2018-06-16 DIAGNOSIS — K660 Peritoneal adhesions (postprocedural) (postinfection): Secondary | ICD-10-CM | POA: Diagnosis present

## 2018-06-16 DIAGNOSIS — R110 Nausea: Secondary | ICD-10-CM | POA: Diagnosis not present

## 2018-06-16 DIAGNOSIS — Z6836 Body mass index (BMI) 36.0-36.9, adult: Secondary | ICD-10-CM

## 2018-06-16 DIAGNOSIS — Z8249 Family history of ischemic heart disease and other diseases of the circulatory system: Secondary | ICD-10-CM | POA: Diagnosis not present

## 2018-06-16 DIAGNOSIS — Z9884 Bariatric surgery status: Secondary | ICD-10-CM | POA: Diagnosis not present

## 2018-06-16 DIAGNOSIS — Z811 Family history of alcohol abuse and dependence: Secondary | ICD-10-CM | POA: Diagnosis not present

## 2018-06-16 DIAGNOSIS — Z833 Family history of diabetes mellitus: Secondary | ICD-10-CM | POA: Diagnosis not present

## 2018-06-16 DIAGNOSIS — Z905 Acquired absence of kidney: Secondary | ICD-10-CM

## 2018-06-16 DIAGNOSIS — R1010 Upper abdominal pain, unspecified: Secondary | ICD-10-CM | POA: Diagnosis not present

## 2018-06-16 DIAGNOSIS — G8929 Other chronic pain: Secondary | ICD-10-CM

## 2018-06-16 DIAGNOSIS — J309 Allergic rhinitis, unspecified: Secondary | ICD-10-CM | POA: Diagnosis present

## 2018-06-16 DIAGNOSIS — K219 Gastro-esophageal reflux disease without esophagitis: Secondary | ICD-10-CM | POA: Diagnosis present

## 2018-06-16 DIAGNOSIS — Z85528 Personal history of other malignant neoplasm of kidney: Secondary | ICD-10-CM | POA: Diagnosis not present

## 2018-06-16 DIAGNOSIS — G40909 Epilepsy, unspecified, not intractable, without status epilepticus: Secondary | ICD-10-CM | POA: Diagnosis present

## 2018-06-16 DIAGNOSIS — Z9049 Acquired absence of other specified parts of digestive tract: Secondary | ICD-10-CM | POA: Diagnosis not present

## 2018-06-16 DIAGNOSIS — G4730 Sleep apnea, unspecified: Secondary | ICD-10-CM | POA: Diagnosis present

## 2018-06-16 HISTORY — PX: UPPER GI ENDOSCOPY: SHX6162

## 2018-06-16 HISTORY — PX: LAPAROSCOPY: SHX197

## 2018-06-16 LAB — CBC WITH DIFFERENTIAL/PLATELET
ABS IMMATURE GRANULOCYTES: 0.01 10*3/uL (ref 0.00–0.07)
BASOS PCT: 1 %
Basophils Absolute: 0.1 10*3/uL (ref 0.0–0.1)
EOS ABS: 0.1 10*3/uL (ref 0.0–0.5)
EOS PCT: 1 %
HCT: 42.7 % (ref 36.0–46.0)
Hemoglobin: 14.2 g/dL (ref 12.0–15.0)
Immature Granulocytes: 0 %
Lymphocytes Relative: 36 %
Lymphs Abs: 1.8 10*3/uL (ref 0.7–4.0)
MCH: 31.4 pg (ref 26.0–34.0)
MCHC: 33.3 g/dL (ref 30.0–36.0)
MCV: 94.5 fL (ref 80.0–100.0)
MONO ABS: 0.4 10*3/uL (ref 0.1–1.0)
Monocytes Relative: 8 %
NEUTROS ABS: 2.7 10*3/uL (ref 1.7–7.7)
Neutrophils Relative %: 54 %
PLATELETS: 151 10*3/uL (ref 150–400)
RBC: 4.52 MIL/uL (ref 3.87–5.11)
RDW: 13.2 % (ref 11.5–15.5)
WBC: 5 10*3/uL (ref 4.0–10.5)
nRBC: 0 % (ref 0.0–0.2)

## 2018-06-16 LAB — COMPREHENSIVE METABOLIC PANEL
ALK PHOS: 83 U/L (ref 38–126)
ALT: 30 U/L (ref 0–44)
AST: 26 U/L (ref 15–41)
Albumin: 3.8 g/dL (ref 3.5–5.0)
Anion gap: 9 (ref 5–15)
BILIRUBIN TOTAL: 0.8 mg/dL (ref 0.3–1.2)
BUN: 17 mg/dL (ref 6–20)
CALCIUM: 9.3 mg/dL (ref 8.9–10.3)
CHLORIDE: 108 mmol/L (ref 98–111)
CO2: 21 mmol/L — ABNORMAL LOW (ref 22–32)
CREATININE: 0.74 mg/dL (ref 0.44–1.00)
Glucose, Bld: 155 mg/dL — ABNORMAL HIGH (ref 70–99)
Potassium: 4.1 mmol/L (ref 3.5–5.1)
Sodium: 138 mmol/L (ref 135–145)
TOTAL PROTEIN: 7.3 g/dL (ref 6.5–8.1)

## 2018-06-16 LAB — GLUCOSE, CAPILLARY
GLUCOSE-CAPILLARY: 120 mg/dL — AB (ref 70–99)
Glucose-Capillary: 127 mg/dL — ABNORMAL HIGH (ref 70–99)
Glucose-Capillary: 94 mg/dL (ref 70–99)
Glucose-Capillary: 118 mg/dL — ABNORMAL HIGH (ref 70–99)

## 2018-06-16 LAB — TROPONIN I

## 2018-06-16 LAB — LACTIC ACID, PLASMA: LACTIC ACID, VENOUS: 1.3 mmol/L (ref 0.5–1.9)

## 2018-06-16 LAB — LIPASE, BLOOD: LIPASE: 47 U/L (ref 11–51)

## 2018-06-16 SURGERY — ENDOSCOPY, UPPER GI TRACT
Anesthesia: General | Site: Abdomen

## 2018-06-16 MED ORDER — HYDROMORPHONE HCL 1 MG/ML IJ SOLN
1.0000 mg | Freq: Once | INTRAMUSCULAR | Status: AC
  Administered 2018-06-16: 1 mg via INTRAVENOUS
  Filled 2018-06-16: qty 1

## 2018-06-16 MED ORDER — PANTOPRAZOLE SODIUM 40 MG PO TBEC
40.0000 mg | DELAYED_RELEASE_TABLET | Freq: Every day | ORAL | Status: DC
  Administered 2018-06-16 – 2018-06-25 (×10): 40 mg via ORAL
  Filled 2018-06-16 (×10): qty 1

## 2018-06-16 MED ORDER — ONDANSETRON HCL 4 MG/2ML IJ SOLN
4.0000 mg | Freq: Once | INTRAMUSCULAR | Status: AC
  Administered 2018-06-16: 4 mg via INTRAVENOUS
  Filled 2018-06-16: qty 2

## 2018-06-16 MED ORDER — IOPAMIDOL (ISOVUE-300) INJECTION 61%: 100.0000 mL | Freq: Once | INTRAVENOUS | Status: DC | PRN

## 2018-06-16 MED ORDER — KCL IN DEXTROSE-NACL 20-5-0.45 MEQ/L-%-% IV SOLN: INTRAVENOUS | Status: DC

## 2018-06-16 MED ORDER — KCL IN DEXTROSE-NACL 20-5-0.45 MEQ/L-%-% IV SOLN
INTRAVENOUS | Status: DC
  Administered 2018-06-16 – 2018-06-22 (×10): via INTRAVENOUS
  Filled 2018-06-16 (×10): qty 1000

## 2018-06-16 MED ORDER — LAMOTRIGINE 100 MG PO TABS
200.0000 mg | ORAL_TABLET | Freq: Two times a day (BID) | ORAL | Status: DC
  Administered 2018-06-16 – 2018-06-25 (×18): 200 mg via ORAL
  Filled 2018-06-16 (×18): qty 2

## 2018-06-16 MED ORDER — LACTATED RINGERS IV SOLN
INTRAVENOUS | Status: DC
  Administered 2018-06-16: 10:00:00 via INTRAVENOUS

## 2018-06-16 MED ORDER — PROPOFOL 10 MG/ML IV BOLUS
INTRAVENOUS | Status: AC
  Filled 2018-06-16: qty 20

## 2018-06-16 MED ORDER — DIPHENHYDRAMINE HCL 25 MG PO CAPS
25.0000 mg | ORAL_CAPSULE | Freq: Four times a day (QID) | ORAL | Status: DC | PRN
  Administered 2018-06-17 – 2018-06-25 (×12): 25 mg via ORAL
  Filled 2018-06-16 (×13): qty 1

## 2018-06-16 MED ORDER — ACETAMINOPHEN 10 MG/ML IV SOLN
INTRAVENOUS | Status: DC | PRN
  Administered 2018-06-16: 1000 mg via INTRAVENOUS

## 2018-06-16 MED ORDER — NITROGLYCERIN 0.4 MG SL SUBL: 0.4000 mg | SUBLINGUAL_TABLET | SUBLINGUAL | Status: DC | PRN

## 2018-06-16 MED ORDER — SCOPOLAMINE 1 MG/3DAYS TD PT72: 1.0000 | MEDICATED_PATCH | TRANSDERMAL | Status: DC

## 2018-06-16 MED ORDER — SODIUM CHLORIDE 0.9 % IV BOLUS
1000.0000 mL | Freq: Once | INTRAVENOUS | Status: AC
  Administered 2018-06-16: 1000 mL via INTRAVENOUS

## 2018-06-16 MED ORDER — ROCURONIUM BROMIDE 10 MG/ML (PF) SYRINGE
PREFILLED_SYRINGE | INTRAVENOUS | Status: AC
  Filled 2018-06-16: qty 10

## 2018-06-16 MED ORDER — HYDROMORPHONE HCL 1 MG/ML IJ SOLN
0.5000 mg | INTRAMUSCULAR | Status: DC | PRN
  Administered 2018-06-16 (×3): 2 mg via INTRAVENOUS
  Administered 2018-06-17: 1 mg via INTRAVENOUS
  Administered 2018-06-17: 2 mg via INTRAVENOUS
  Filled 2018-06-16: qty 2
  Filled 2018-06-16: qty 1
  Filled 2018-06-16 (×3): qty 2

## 2018-06-16 MED ORDER — CEFAZOLIN SODIUM-DEXTROSE 2-4 GM/100ML-% IV SOLN
2.0000 g | INTRAVENOUS | Status: AC
  Administered 2018-06-16: 2 g via INTRAVENOUS

## 2018-06-16 MED ORDER — DULOXETINE HCL 30 MG PO CPEP
30.0000 mg | ORAL_CAPSULE | Freq: Every day | ORAL | Status: DC
  Administered 2018-06-16 – 2018-06-25 (×10): 30 mg via ORAL
  Filled 2018-06-16 (×10): qty 1

## 2018-06-16 MED ORDER — BUPIVACAINE-EPINEPHRINE (PF) 0.25% -1:200000 IJ SOLN
INTRAMUSCULAR | Status: AC
  Filled 2018-06-16: qty 30

## 2018-06-16 MED ORDER — SUGAMMADEX SODIUM 200 MG/2ML IV SOLN
INTRAVENOUS | Status: DC | PRN
  Administered 2018-06-16: 200 mg via INTRAVENOUS

## 2018-06-16 MED ORDER — ENOXAPARIN SODIUM 40 MG/0.4ML ~~LOC~~ SOLN: 40.0000 mg | SUBCUTANEOUS | Status: DC

## 2018-06-16 MED ORDER — SCOPOLAMINE 1 MG/3DAYS TD PT72
MEDICATED_PATCH | TRANSDERMAL | Status: AC
  Administered 2018-06-16: 1.5 mg
  Filled 2018-06-16: qty 1

## 2018-06-16 MED ORDER — LEVETIRACETAM IN NACL 1000 MG/100ML IV SOLN
1000.0000 mg | Freq: Two times a day (BID) | INTRAVENOUS | Status: DC
  Filled 2018-06-16: qty 100

## 2018-06-16 MED ORDER — IRBESARTAN 150 MG PO TABS
150.0000 mg | ORAL_TABLET | Freq: Every day | ORAL | Status: DC
  Administered 2018-06-16 – 2018-06-25 (×10): 150 mg via ORAL
  Filled 2018-06-16 (×10): qty 1

## 2018-06-16 MED ORDER — HYDRALAZINE HCL 20 MG/ML IJ SOLN: 10.0000 mg | Freq: Three times a day (TID) | INTRAMUSCULAR | Status: DC | PRN

## 2018-06-16 MED ORDER — PANTOPRAZOLE SODIUM 40 MG IV SOLR: 40.0000 mg | Freq: Two times a day (BID) | INTRAVENOUS | Status: DC

## 2018-06-16 MED ORDER — LIDOCAINE 2% (20 MG/ML) 5 ML SYRINGE
INTRAMUSCULAR | Status: AC
  Filled 2018-06-16: qty 5

## 2018-06-16 MED ORDER — CARVEDILOL 12.5 MG PO TABS
12.5000 mg | ORAL_TABLET | Freq: Two times a day (BID) | ORAL | Status: DC
  Administered 2018-06-16 – 2018-06-25 (×18): 12.5 mg via ORAL
  Filled 2018-06-16 (×19): qty 1

## 2018-06-16 MED ORDER — ONDANSETRON HCL 4 MG/2ML IJ SOLN
4.0000 mg | Freq: Four times a day (QID) | INTRAMUSCULAR | Status: DC | PRN
  Administered 2018-06-16 – 2018-06-25 (×12): 4 mg via INTRAVENOUS
  Filled 2018-06-16 (×12): qty 2

## 2018-06-16 MED ORDER — INSULIN ASPART 100 UNIT/ML ~~LOC~~ SOLN
0.0000 [IU] | Freq: Three times a day (TID) | SUBCUTANEOUS | Status: DC
  Administered 2018-06-18 (×3): 1 [IU] via SUBCUTANEOUS

## 2018-06-16 MED ORDER — PHENYLEPHRINE 40 MCG/ML (10ML) SYRINGE FOR IV PUSH (FOR BLOOD PRESSURE SUPPORT)
PREFILLED_SYRINGE | INTRAVENOUS | Status: DC | PRN
  Administered 2018-06-16: 80 ug via INTRAVENOUS

## 2018-06-16 MED ORDER — ONDANSETRON 4 MG PO TBDP
4.0000 mg | ORAL_TABLET | Freq: Four times a day (QID) | ORAL | Status: DC | PRN
  Administered 2018-06-17 – 2018-06-23 (×8): 4 mg via ORAL
  Filled 2018-06-16 (×8): qty 1

## 2018-06-16 MED ORDER — METOPROLOL TARTRATE 5 MG/5ML IV SOLN: 5.0000 mg | Freq: Four times a day (QID) | INTRAVENOUS | Status: DC

## 2018-06-16 MED ORDER — CEFAZOLIN SODIUM-DEXTROSE 2-4 GM/100ML-% IV SOLN
INTRAVENOUS | Status: AC
  Filled 2018-06-16: qty 100

## 2018-06-16 MED ORDER — ACETAMINOPHEN 10 MG/ML IV SOLN
INTRAVENOUS | Status: AC
  Filled 2018-06-16: qty 100

## 2018-06-16 MED ORDER — ONDANSETRON HCL 4 MG/2ML IJ SOLN
INTRAMUSCULAR | Status: AC
  Filled 2018-06-16: qty 2

## 2018-06-16 MED ORDER — SODIUM CHLORIDE (PF) 0.9 % IJ SOLN
INTRAMUSCULAR | Status: AC
  Filled 2018-06-16: qty 50

## 2018-06-16 MED ORDER — HYDROMORPHONE HCL 1 MG/ML IJ SOLN: 0.2500 mg | INTRAMUSCULAR | Status: DC | PRN

## 2018-06-16 MED ORDER — HYDRALAZINE HCL 20 MG/ML IJ SOLN: 10.0000 mg | Freq: Four times a day (QID) | INTRAMUSCULAR | Status: DC | PRN

## 2018-06-16 MED ORDER — SUGAMMADEX SODIUM 200 MG/2ML IV SOLN
INTRAVENOUS | Status: AC
  Filled 2018-06-16: qty 2

## 2018-06-16 MED ORDER — MIDAZOLAM HCL 2 MG/2ML IJ SOLN
INTRAMUSCULAR | Status: AC
  Filled 2018-06-16: qty 2

## 2018-06-16 MED ORDER — PROMETHAZINE HCL 25 MG/ML IJ SOLN: 6.2500 mg | INTRAMUSCULAR | Status: DC | PRN

## 2018-06-16 MED ORDER — LIDOCAINE 2% (20 MG/ML) 5 ML SYRINGE
INTRAMUSCULAR | Status: DC | PRN
  Administered 2018-06-16: 100 mg via INTRAVENOUS

## 2018-06-16 MED ORDER — ALBUMIN HUMAN 5 % IV SOLN
INTRAVENOUS | Status: AC
  Filled 2018-06-16: qty 500

## 2018-06-16 MED ORDER — SUCCINYLCHOLINE CHLORIDE 20 MG/ML IJ SOLN
INTRAMUSCULAR | Status: DC | PRN
  Administered 2018-06-16: 140 mg via INTRAVENOUS

## 2018-06-16 MED ORDER — DIPHENHYDRAMINE HCL 50 MG/ML IJ SOLN
25.0000 mg | Freq: Four times a day (QID) | INTRAMUSCULAR | Status: DC | PRN
  Administered 2018-06-17 – 2018-06-24 (×3): 25 mg via INTRAVENOUS
  Filled 2018-06-16 (×3): qty 1

## 2018-06-16 MED ORDER — LAMOTRIGINE ER 200 MG PO TB24: 400.0000 mg | ORAL_TABLET | Freq: Every day | ORAL | Status: DC

## 2018-06-16 MED ORDER — ROCURONIUM BROMIDE 10 MG/ML (PF) SYRINGE
PREFILLED_SYRINGE | INTRAVENOUS | Status: DC | PRN
  Administered 2018-06-16: 30 mg via INTRAVENOUS

## 2018-06-16 MED ORDER — IOPAMIDOL (ISOVUE-300) INJECTION 61%
INTRAVENOUS | Status: AC
  Filled 2018-06-16: qty 100

## 2018-06-16 MED ORDER — FENTANYL CITRATE (PF) 100 MCG/2ML IJ SOLN
INTRAMUSCULAR | Status: AC
  Filled 2018-06-16: qty 2

## 2018-06-16 MED ORDER — ALBUMIN HUMAN 5 % IV SOLN
INTRAVENOUS | Status: DC | PRN
  Administered 2018-06-16: 11:00:00 via INTRAVENOUS

## 2018-06-16 MED ORDER — METOPROLOL TARTRATE 5 MG/5ML IV SOLN: 5.0000 mg | Freq: Four times a day (QID) | INTRAVENOUS | Status: DC | PRN

## 2018-06-16 MED ORDER — FENTANYL CITRATE (PF) 100 MCG/2ML IJ SOLN
INTRAMUSCULAR | Status: DC | PRN
  Administered 2018-06-16: 50 ug via INTRAVENOUS
  Administered 2018-06-16: 100 ug via INTRAVENOUS
  Administered 2018-06-16: 50 ug via INTRAVENOUS

## 2018-06-16 MED ORDER — ENOXAPARIN SODIUM 40 MG/0.4ML ~~LOC~~ SOLN
40.0000 mg | SUBCUTANEOUS | Status: DC
  Administered 2018-06-17 – 2018-06-25 (×9): 40 mg via SUBCUTANEOUS
  Filled 2018-06-16 (×9): qty 0.4

## 2018-06-16 MED ORDER — BUPIVACAINE-EPINEPHRINE 0.25% -1:200000 IJ SOLN
INTRAMUSCULAR | Status: DC | PRN
  Administered 2018-06-16: 20 mL

## 2018-06-16 MED ORDER — SUCCINYLCHOLINE CHLORIDE 200 MG/10ML IV SOSY
PREFILLED_SYRINGE | INTRAVENOUS | Status: AC
  Filled 2018-06-16: qty 10

## 2018-06-16 MED ORDER — PROPOFOL 10 MG/ML IV BOLUS
INTRAVENOUS | Status: DC | PRN
  Administered 2018-06-16: 150 mg via INTRAVENOUS

## 2018-06-16 SURGICAL SUPPLY — 33 items
ADH SKN CLS APL DERMABOND .7 (GAUZE/BANDAGES/DRESSINGS) ×2
CHLORAPREP W/TINT 26ML (MISCELLANEOUS) ×4 IMPLANT
CLOSURE WOUND 1/2 X4 (GAUZE/BANDAGES/DRESSINGS)
COVER SURGICAL LIGHT HANDLE (MISCELLANEOUS) ×4 IMPLANT
COVER WAND RF STERILE (DRAPES) IMPLANT
DECANTER SPIKE VIAL GLASS SM (MISCELLANEOUS) IMPLANT
DERMABOND ADVANCED (GAUZE/BANDAGES/DRESSINGS) ×2
DERMABOND ADVANCED .7 DNX12 (GAUZE/BANDAGES/DRESSINGS) IMPLANT
ELECT REM PT RETURN 15FT ADLT (MISCELLANEOUS) ×4 IMPLANT
GLOVE BIOGEL PI IND STRL 7.0 (GLOVE) ×2 IMPLANT
GLOVE BIOGEL PI INDICATOR 7.0 (GLOVE) ×2
GLOVE SURG ORTHO 8.0 STRL STRW (GLOVE) ×4 IMPLANT
GOWN STRL REUS W/TWL LRG LVL3 (GOWN DISPOSABLE) ×4 IMPLANT
GOWN STRL REUS W/TWL XL LVL3 (GOWN DISPOSABLE) ×8 IMPLANT
IRRIG SUCT STRYKERFLOW 2 WTIP (MISCELLANEOUS)
IRRIGATION SUCT STRKRFLW 2 WTP (MISCELLANEOUS) IMPLANT
KIT BASIN OR (CUSTOM PROCEDURE TRAY) ×4 IMPLANT
KIT TURNOVER KIT A (KITS) IMPLANT
SCISSORS LAP 5X45 EPIX DISP (ENDOMECHANICALS) ×2 IMPLANT
SET TUBE SMOKE EVAC HIGH FLOW (TUBING) ×2 IMPLANT
SHEARS HARMONIC ACE PLUS 36CM (ENDOMECHANICALS) IMPLANT
SLEEVE ADV FIXATION 5X100MM (TROCAR) ×2 IMPLANT
SOLUTION ANTI FOG 6CC (MISCELLANEOUS) ×4 IMPLANT
STRIP CLOSURE SKIN 1/2X4 (GAUZE/BANDAGES/DRESSINGS) IMPLANT
SUT VIC AB 4-0 PS2 27 (SUTURE) IMPLANT
TOWEL OR 17X26 10 PK STRL BLUE (TOWEL DISPOSABLE) ×4 IMPLANT
TRAY FOLEY MTR SLVR 16FR STAT (SET/KITS/TRAYS/PACK) IMPLANT
TRAY LAPAROSCOPIC (CUSTOM PROCEDURE TRAY) ×6 IMPLANT
TROCAR ADV FIXATION 5X100MM (TROCAR) ×2 IMPLANT
TROCAR XCEL BLUNT TIP 100MML (ENDOMECHANICALS) ×4 IMPLANT
TROCAR XCEL NON-BLD 11X100MML (ENDOMECHANICALS) IMPLANT
TROCAR XCEL UNIV SLVE 11M 100M (ENDOMECHANICALS) IMPLANT
WATER STERILE IRR 1000ML POUR (IV SOLUTION) ×4 IMPLANT

## 2018-06-16 NOTE — ED Notes (Signed)
Rancour, MD at bedside attempting ultrasound IV

## 2018-06-16 NOTE — ED Notes (Addendum)
Dilaudid and Zofran given IM per Rancour, MD

## 2018-06-16 NOTE — ED Provider Notes (Signed)
[facility] DEPT Provider Note   CSN: [id number] Arrival date & time: 06/16/18  0253    History   Chief Complaint Chief Complaint  Patient presents with  . Abdominal Pain    HPI Denise Davis is a 53 y.o. female.     Patient with history of morbid obesity, hiatal hernia, acid reflux, hypertension, previous nephrectomy for renal cell cancer presenting with severe upper abdominal pain since about 730 last night.  She reports the pain came on suddenly after she took 1 bite of food and has been constant since.  She is had nausea and dry heaving but no vomiting.  She has had a previous cholecystectomy and gastric bypass.  She is never had this kind of pain before.  It is constant and severe and radiates to her upper abdomen.  There is no back pain or chest pain.  No diarrhea.  No pain with urination or blood in the urine.  No vaginal bleeding or vaginal discharge.  She reports she is no longer on any acid reflux medications.  The history is provided by the patient. The history is limited by the condition of the patient.  Abdominal Pain  Associated symptoms: nausea and vomiting   Associated symptoms: no chest pain, no cough, no diarrhea, no dysuria, no fever and no shortness of breath     Past Medical History:  Diagnosis Date  . Allergic rhinitis   . Anxiety   . Depression   . Diabetes mellitus    taken off meds in beginning fo 2018 dueto weight loss   . DVT of leg (deep venous thrombosis) ([place]) 12/2007   left leg, given warfarin for 6 mos  . GERD (gastroesophageal reflux disease)   . H/O hiatal hernia   . Hyperlipidemia   . Hypertension   . Morbid obesity ([place])   . Pericarditis    Diagnosed/treated at [facility]; per patient 4 episodes per cardiologist Dr. Edwin Dada persistant chest pain unclear if pericarditis verusus neuropsychogenic   . Renal cell cancer ([place]) 2009   S/p nephrectomy (left)  . Seizure disorder ([place])    (04/08/17)- None in over 1 yr,  no meds.    Patient Active Problem List   Diagnosis Date Noted  . Acute headache 01/08/2018  . History of recent fall 01/08/2018  . Generalized pain 12/05/2017  . Decreased appetite 12/05/2017  . Weakness generalized 12/05/2017  . Fatigue 12/05/2017  . Leukopenia 09/28/2017  . Passed out 08/07/2017  . Recurrent major depressive disorder, in remission ([place]) 07/23/2017  . Renal cell carcinoma ([place]) 07/23/2017  . Itching 06/27/2017  . Pain of toe of right foot 06/27/2017  . Headache 06/27/2017  . Seizure-like activity ([place]) 05/08/2017  . S/P laparoscopic cholecystectomy 05/02/2017  . BPPV (benign paroxysmal positional vertigo), right 01/24/2017  . SOB (shortness of breath) 12/18/2016  . Esophageal spasm 05/30/2016  . Nausea   . Bariatric surgery status   . Other specified diseases of esophagus   . Gout 03/19/2016  . Neuropathy 02/12/2016  . Decreased pedal pulses 02/12/2016  . Allergic rhinitis 02/12/2016  . Hypokalemia 12/08/2015  . Sleep apnea 12/01/2015  . Palpitations 12/01/2015  . S/P gastric bypass 10/03/2015  . Osteoarthritis 10/03/2015  . Cannabis use disorder, moderate, in sustained remission ([place]) 08/18/2015  . Post traumatic stress disorder (PTSD) 08/18/2015  . Marginal ulcer 05/31/2015  . Anxiety and depression 02/13/2015  . Seizure disorder ([place]) 02/13/2015  . GERD (gastroesophageal reflux disease) 02/13/2015  . Chronic respiratory failure (  [place]) 08/28/2013  . Right sided weakness 08/25/2013  . Concussion 07/08/2013  . Insomnia 07/08/2013  . Otitis media 07/08/2013  . Zoster without complications 53/64/6803  . Pneumonia 04/29/2013  . Moderate episode of recurrent major depressive disorder ([place]) 10/27/2012  . Dysphonia 09/04/2012  . Vocal cord palsy 08/25/2012  . Vocal cord dysfunction 08/18/2012  . Morbid (severe) obesity due to excess calories ([place]) 08/11/2012  . Hypoglycemia 04/23/2012  . Other postprocedural status(V45.89) 02/11/2012  . Pain of lower  extremity 01/01/2012  . Muscle pain 12/03/2011  . Achilles bursitis or tendinitis 08/06/2011  . Onychomycosis due to dermatophyte 08/06/2011  . Paronychia of toe 08/06/2011  . Syndrome affecting cervical region 05/17/2011  . Personal history of other venous thrombosis and embolism 05/15/2011  . Chest pain 05/09/2011  . Diabetes mellitus ([place])   . Hypertension   . Dizziness 08/22/2009  . Skin sensation disturbance 08/18/2009  . Visual disturbance 08/18/2009  . Umbilical hernia without obstruction or gangrene 09/28/2008  . Hyperlipidemia 09/07/2008  . Malignant neoplasm of kidney excluding renal pelvis ([place]) 09/07/2008    Past Surgical History:  Procedure Laterality Date  . CHOLECYSTECTOMY N/A 05/02/2017   Procedure: LAPAROSCOPIC CHOLECYSTECTOMY WITH INTRAOPERATIVE CHOLANGIOGRAM;  Surgeon: Johnathan Hausen, MD;  Location: [facility]S;  Service: General;  Laterality: N/A;  . ENDOMETRIAL ABLATION  2007  . ESOPHAGEAL MANOMETRY N/A 05/21/2016   Procedure: ESOPHAGEAL MANOMETRY (EM);  Surgeon: Lucilla Lame, MD;  Location: [facility] ENDOSCOPY;  Service: Endoscopy;  Laterality: N/A;  . ESOPHAGOGASTRODUODENOSCOPY (EGD) WITH PROPOFOL N/A 05/14/2016   Procedure: ESOPHAGOGASTRODUODENOSCOPY (EGD) WITH PROPOFOL;  Surgeon: Lucilla Lame, MD;  Location: [facility] ENDOSCOPY;  Service: Endoscopy;  Laterality: N/A;  . ESOPHAGOGASTRODUODENOSCOPY (EGD) WITH PROPOFOL N/A 04/10/2017   Procedure: ESOPHAGOGASTRODUODENOSCOPY (EGD) WITH PROPOFOL;  Surgeon: Lucilla Lame, MD;  Location: [facility];  Service: Endoscopy;  Laterality: N/A;  . INSERTION OF MESH  2014  . LEFT HEART CATHETERIZATION WITH CORONARY ANGIOGRAM N/A 08/30/2013   Procedure: LEFT HEART CATHETERIZATION WITH CORONARY ANGIOGRAM;  Surgeon: Troy Sine, MD;  Location: [facility] CATH LAB;  Service: Cardiovascular;  Laterality: N/A;  . NEPHRECTOMY    . REPLACEMENT TOTAL KNEE BILATERAL Bilateral 2018   2/18, 5/18 [facility]  . ROUX-EN-Y GASTRIC BYPASS  04/27/2015   [facility]  .  TUBAL LIGATION    . UMBILICAL HERNIA REPAIR       OB History   No obstetric history on file.      Home Medications    Prior to Admission medications   Medication Sig Start Date End Date Taking? Authorizing Provider  acarbose (PRECOSE) 25 MG tablet Take 25 mg by mouth 3 (three) times daily with meals.    [provider]  albuterol (VENTOLIN HFA) 108 (90 Base) MCG/ACT inhaler Inhale 2 puffs into the lungs every 6 (six) hours as needed for wheezing or shortness of breath. 12/04/17   Jodelle Green, FNP  amoxicillin-clavulanate (AUGMENTIN) 875-125 MG tablet Take 1 tablet by mouth. For dental 09/26/16   [provider]  aspirin 81 MG chewable tablet Chew 81 mg by mouth daily.    [provider]  budesonide-formoterol (SYMBICORT) 160-4.5 MCG/ACT inhaler Inhale 2 puffs into the lungs 2 (two) times daily. Rinse mouth after use 12/09/17   Laverle Hobby, MD  butalbital-acetaminophen-caffeine (FIORICET, ESGIC) 50-325-40 MG tablet Take 1 tablet by mouth every 6 (six) hours as needed for headache. Do not refill in less than 30 days 01/19/18   Dennie Bible, NP  calcium-vitamin D 250-100 MG-UNIT tablet  Take 1 tablet by mouth 2 (two) times daily. Reported on 06/16/2015 10/02/15   Leone Haven, MD  carvedilol (COREG) 12.5 MG tablet TAKE 1 TABLET BY MOUTH TWICE DAILY WITH MEALS 05/19/18   Minna Merritts, MD  Cholecalciferol (VITAMIN D-3) 1000 units CAPS Take 1 capsule (1,000 Units total) by mouth daily. Reported on 06/16/2015 10/02/15   Leone Haven, MD  clotrimazole-betamethasone (LOTRISONE) cream Apply 1 application topically 2 (two) times daily. 12/04/17   Jodelle Green, FNP  dexlansoprazole (DEXILANT) 60 MG capsule Take 1 capsule (60 mg total) by mouth daily. 05/01/18   Lucilla Lame, MD  DULoxetine (CYMBALTA) 30 MG capsule TAKE 1 CAPSULE BY MOUTH ONCE DAILY 03/02/18   Leone Haven, MD  fexofenadine [facility]) 180 MG tablet Take 1 tablet (180 mg  total) by mouth daily. 12/08/17   Guse, Jacquelynn Cree, FNP  fluticasone (FLONASE) 50 MCG/ACT nasal spray Place 2 sprays into both nostrils daily. 12/04/17   Guse, Jacquelynn Cree, FNP  Galcanezumab-gnlm (EMGALITY) 120 MG/ML SOAJ Inject 120 mg into the skin every 30 (thirty) days. 01/26/18   Dennie Bible, NP  Galcanezumab-gnlm [facility]) 120 MG/ML SOAJ Inject 240 mg into the skin every 30 (thirty) days. This is the loading dose 01/26/18   Dennie Bible, NP  Galcanezumab-gnlm [facility]) 120 MG/ML SOSY Inject 120 mg into the skin every 30 (thirty) days.    [provider]  hydrOXYzine (VISTARIL) 50 MG capsule Take 1 capsule (50 mg total) by mouth 3 (three) times daily as needed for itching. 01/07/18   Jodelle Green, FNP  LamoTRIgine 200 MG TB24 24 hour tablet Take two tablets at bedtime. 10/01/17   Marcial Pacas, MD  loratadine (CLARITIN) 10 MG tablet Take 1 tablet (10 mg total) by mouth daily. 06/27/17   Leone Haven, MD  Melatonin 3 MG TABS Take 9 mg by mouth.  09/20/15   [provider]  montelukast (SINGULAIR) 10 MG tablet Take 1 tablet (10 mg total) by mouth at bedtime. 01/07/18   Jodelle Green, FNP  Multiple Vitamins-Minerals (MULTIVITAMIN WITH MINERALS) tablet Take 1 tablet by mouth daily. Reported on 06/16/2015    [provider]  nitroGLYCERIN (NITROSTAT) 0.4 MG SL tablet Place 1 tablet (0.4 mg total) under the tongue every 5 (five) minutes as needed for chest pain. (may repeat every 5 minutes but seek medical help if pain persists after 3 tablets) 04/23/16   Leone Haven, MD  ondansetron (ZOFRAN-ODT) 4 MG disintegrating tablet DISSOLVE 1 TABLET IN MOUTH EVERY 8 HOURS AS NEEDED FOR NAUSEA AND VOMITING 05/19/18   Leone Haven, MD  oxyCODONE (OXY IR/ROXICODONE) 5 MG immediate release tablet Take 1 tablet (5 mg total) by mouth every 6 (six) hours as needed for moderate pain. 05/04/17   Donnie Mesa, MD  promethazine (PHENERGAN) 25 MG tablet Take 1 tablet (25 mg  total) by mouth every 8 (eight) hours as needed for nausea or vomiting. 05/01/18   Lucilla Lame, MD  sertraline (ZOLOFT) 50 MG tablet Take 1 tablet (50 mg total) by mouth daily. 12/04/17   Jodelle Green, FNP  SUMAtriptan (IMITREX) 50 MG tablet Take 1 tablet (50 mg total) by mouth every 2 (two) hours as needed for migraine. May repeat in 2 hours if headache persists or recurs. 01/15/18   Dennie Bible, NP  telmisartan (MICARDIS) 40 MG tablet TAKE 1 TABLET BY MOUTH ONCE DAILY 05/19/18   Leone Haven, MD  triamcinolone cream (KENALOG)  0.1 % APPLY TOPICALLY TWO TIMES DAILY 02/17/17   Leone Haven, MD  vitamin B-12 (CYANOCOBALAMIN) 500 MCG tablet Take by mouth. 10/02/15   [provider]    Family History Family History  Problem Relation Age of Onset  . Hypertension Mother   . Breast cancer Mother 23  . Arthritis Mother   . Heart disease Paternal Grandmother   . Alcoholism Father   . Hyperlipidemia Other        Parent  . Mental illness Other        Parent  . Diabetes Other        Parent    Social History Social History   Tobacco Use  . Smoking status: Former Smoker    Types: Cigarettes    Last attempt to quit: 1984    Years since quitting: 36.2  . Smokeless tobacco: Never Used  Substance Use Topics  . Alcohol use: No    Frequency: Never  . Drug use: No     Allergies   Lisinopril   Review of Systems Review of Systems  Constitutional: Positive for activity change and appetite change. Negative for fever.  HENT: Negative for congestion and rhinorrhea.   Eyes: Negative for visual disturbance.  Respiratory: Negative for cough and shortness of breath.   Cardiovascular: Negative for chest pain.  Gastrointestinal: Positive for abdominal pain, nausea and vomiting. Negative for diarrhea.  Genitourinary: Negative for dysuria and pelvic pain.  Musculoskeletal: Negative for arthralgias, back pain and myalgias.  Neurological: Negative for dizziness, weakness,  light-headedness and headaches.    all other systems are negative except as noted in the HPI and PMH.    Physical Exam Updated Vital Signs BP (!) 154/100 (BP Location: Left Arm)   Pulse 77   Temp 99.1 F (37.3 C) (Oral)   Resp (!) 24   Ht 5\' 5"  (1.651 m)   Wt 99.8 kg   SpO2 93%   BMI 36.61 kg/m   Physical Exam Vitals signs and nursing note reviewed.  Constitutional:      General: She is in acute distress.     Appearance: She is well-developed. She is obese.     Comments: Uncomfortable appearing holding upper abdomen  HENT:     Head: Normocephalic and atraumatic.     Mouth/Throat:     Pharynx: No oropharyngeal exudate.  Eyes:     Conjunctiva/sclera: Conjunctivae normal.     Pupils: Pupils are equal, round, and reactive to light.  Neck:     Musculoskeletal: Normal range of motion and neck supple.     Comments: No meningismus. Cardiovascular:     Rate and Rhythm: Normal rate and regular rhythm.     Heart sounds: Normal heart sounds. No murmur.  Pulmonary:     Effort: Pulmonary effort is normal. No respiratory distress.     Breath sounds: Normal breath sounds.  Abdominal:     Tenderness: There is abdominal tenderness. There is guarding and rebound.     Comments: Right upper quadrant epigastric tenderness with voluntary guarding. No lower abdominal tenderness  Musculoskeletal: Normal range of motion.        General: No tenderness.  Skin:    General: Skin is warm.  Neurological:     Mental Status: She is alert and oriented to person, place, and time.     Cranial Nerves: No cranial nerve deficit.     Motor: No abnormal muscle tone.     Coordination: Coordination normal.     Comments:  5/5 strength throughout. CN 2-12 intact.Equal grip strength.   Psychiatric:        Behavior: Behavior normal.      ED Treatments / Results  Labs (all labs ordered are listed, but only abnormal results are displayed) Labs Reviewed  COMPREHENSIVE METABOLIC PANEL - Abnormal; Notable  for the following components:      Result Value   CO2 21 (*)    Glucose, Bld 155 (*)    All other components within normal limits  LACTIC ACID, PLASMA  LIPASE, BLOOD  CBC WITH DIFFERENTIAL/PLATELET  CBC WITH DIFFERENTIAL/PLATELET    EKG None  Radiology Ct Abdomen Pelvis Wo Contrast  Result Date: 06/16/2018 CLINICAL DATA:  Abdominal pain after eating dinner. EXAM: CT ABDOMEN AND PELVIS WITHOUT CONTRAST TECHNIQUE: Multidetector CT imaging of the abdomen and pelvis was performed following the standard protocol without IV contrast. COMPARISON:  05/16/2018 FINDINGS: Lower chest:  Coronary calcification. Hepatobiliary: No concerning liver abnormality. Small capsular based calcification along the upper right lobe. Which likely accounts for mild prominence of the common bile duct Pancreas: Unremarkable. Spleen: Unremarkable. Adrenals/Urinary Tract: Negative adrenals. No hydronephrosis or stone. Left nephrectomy, reportedly for renal cell carcinoma unremarkable bladder. Stomach/Bowel: Gastric bypass. No obstruction. No evidence of bowel inflammation Vascular/Lymphatic: No acute vascular abnormality. No mass or adenopathy. Reproductive:Posterior uterine fibroid better depicted on postcontrast imaging comparison. Other: No ascites or pneumoperitoneum. Musculoskeletal: No acute abnormalities.  Lumbar spine degeneration. IMPRESSION: No acute finding.  Stable from 2019. Electronically Signed   By: Monte Fantasia M.D.   On: 06/16/2018 06:32   Dg Abdomen Acute W/chest  Result Date: 06/16/2018 CLINICAL DATA:  Patient states she began to have abdominal pain around 7:30 pm 03/16, pain has been getting worse since, states she is nauseous also, pain is upper abdomen EXAM: DG ABDOMEN ACUTE W/ 1V CHEST COMPARISON:  CT, 05/16/2017 FINDINGS: There is no bowel dilation to suggest obstruction. There is no free air. There are multiple bowel anastomosis staples in the left mid and upper abdomen. Surgical vascular clips are  upper quadrant reflect a prior cholecystectomy. Increased stool is noted in the colon, mild-to-moderate in degree. No evidence of renal or ureteral stones. Soft tissues are otherwise unremarkable. Normal heart, mediastinum and hila.  Clear lungs. Skeletal structures are unremarkable. IMPRESSION: 1. No acute findings.  No evidence of bowel obstruction or free air. 2. Mild to moderate increase colonic stool. 3. Postsurgical changes from prior gastric bypass surgery. 4. No active disease of the chest. Electronically Signed   By: Lajean Manes M.D.   On: 06/16/2018 03:48    Procedures Ultrasound ED Peripheral IV (Provider) Date/Time: 06/16/2018 4:41 AM Performed by: Ezequiel Essex, MD Authorized by: Ezequiel Essex, MD   Procedure details:    Indications: multiple failed IV attempts     Skin Prep: isopropyl alcohol     Location:  Left AC   Angiocath:  20 G   Bedside Ultrasound Guided: Yes     Images: not archived     Patient tolerated procedure without complications: Yes     Dressing applied: Yes     (including critical care time)  Medications Ordered in ED Medications  HYDROmorphone (DILAUDID) injection 1 mg (has no administration in time range)  ondansetron (ZOFRAN) injection 4 mg (has no administration in time range)  sodium chloride 0.9 % bolus 1,000 mL (has no administration in time range)     Initial Impression / Assessment and Plan / ED Course  I have reviewed the triage vital signs  and the nursing notes.  Pertinent labs & imaging results that were available during my care of the patient were reviewed by me and considered in my medical decision making (see chart for details).       Acute onset of upper abdominal pain with nausea in patient with multiple abdominal surgeries including gastric bypass and cholecystectomy.  She appears very uncomfortable has guarding on exam.  Will obtain labs, acute abdominal series  Treat symptoms   Acute abdominal series shows some  air-fluid levels but no bowel dilation.  Discussed with Dr. Autumn Patty of radiology who feels this is not conclusive for bowel obstruction and recommends CT scan.  Labs are reassuring.  White blood cell count is normal.  LFTs and lipase are normal. Lactate normal. Doubt mesenteric ischemia.  CT scan obtained which shows no bowel obstruction or other acute pathology.  Patient still remains very uncomfortable and clinically there is still suspicion for a bowel obstruction or even a small perforation not seen on imaging. Will discuss with general surgery to evaluate her. D/w Dr. Lucia Gaskins.  Final Clinical Impressions(s) / ED Diagnoses   Final diagnoses:  Epigastric pain    ED Discharge Orders    None       Promyse Ardito, Annie Main, MD 06/16/18 [phone]

## 2018-06-16 NOTE — H&P (Signed)
Denise Davis 1965-07-12  [id number].    Chief Complaint/Reason for Consult: acute abdominal pain, s/p RYGB 3 yrs ago at [facility]  HPI:  This is a pleasant 53 yo black female who has a history of HTN, seizure d/o (none in over a year), h/o RCC s/p nephrectomy, H/o DVT over 10 yrs ago, who underwent Roux-en Y gastric bypass surgery around 3 years ago at [facility].  She has since lost over 200lbs.  She had difficulties with the surgery including placement of a feeding tube due to chronic nausea.  She daily takes zofran, phenergan, and dexilant to help with this.  She underwent cholecystectomy a year ago as well and tolerated that well.  She has been in her normal state of health until last night around 7 her son fixed her some fish and she ate one bite and then developed an acute onset of epigastric abdominal pain.  She has never had pain like this.  It is unrelenting.  Nothing helps her pain.  She denies NSAIDs, tobacco abuse, only drinks 1 cup of caffeinated coffee a day.  This is an intense sharp pain with some crampiness as well.  She admits to nausea, but mostly dry heaving.  She denies diarrhea.  Her last BM was 2 days ago, which is normal for her.  She presented to the [facility] where she underwent a CT scan that was negative.  Her labs are essentially normal as well with a normal CBC and lactic acid.  She is currently writhing in the bed and complaining of horrible abdominal pain; therefore, we have been asked to see the patient for further recommendations.  ROS: ROS: Please see HPI, otherwise all other systems have been reviewed and are negative.  Family History  Problem Relation Age of Onset   Hypertension Mother    Breast cancer Mother 5   Arthritis Mother    Heart disease Paternal Grandmother    Alcoholism Father    Hyperlipidemia Other        Parent   Mental illness Other        Parent   Diabetes Other        Parent    Past Medical History:  Diagnosis Date   Allergic  rhinitis    Anxiety    Depression    Diabetes mellitus    taken off meds in beginning fo 2018 dueto weight loss    DVT of leg (deep venous thrombosis) ([place]) 12/2007   left leg, given warfarin for 6 mos   GERD (gastroesophageal reflux disease)    H/O hiatal hernia    Hyperlipidemia    Hypertension    Morbid obesity ([place])    Pericarditis    Diagnosed/treated at [facility]; per patient 4 episodes per cardiologist Dr. Edwin Dada persistant chest pain unclear if pericarditis verusus neuropsychogenic    Renal cell cancer ([place]) 2009   S/p nephrectomy (left)   Seizure disorder ([place])    (04/08/17)- None in over 1 yr, no meds.    Past Surgical History:  Procedure Laterality Date   CHOLECYSTECTOMY N/A 05/02/2017   Procedure: LAPAROSCOPIC CHOLECYSTECTOMY WITH INTRAOPERATIVE CHOLANGIOGRAM;  Surgeon: Johnathan Hausen, MD;  Location: [facility] ORS;  Service: General;  Laterality: N/A;   ENDOMETRIAL ABLATION  2007   ESOPHAGEAL MANOMETRY N/A 05/21/2016   Procedure: ESOPHAGEAL MANOMETRY (EM);  Surgeon: Lucilla Lame, MD;  Location: [facility] ENDOSCOPY;  Service: Endoscopy;  Laterality: N/A;   ESOPHAGOGASTRODUODENOSCOPY (EGD) WITH PROPOFOL N/A 05/14/2016   Procedure: ESOPHAGOGASTRODUODENOSCOPY (EGD)  WITH PROPOFOL;  Surgeon: Lucilla Lame, MD;  Location: [facility] ENDOSCOPY;  Service: Endoscopy;  Laterality: N/A;   ESOPHAGOGASTRODUODENOSCOPY (EGD) WITH PROPOFOL N/A 04/10/2017   Procedure: ESOPHAGOGASTRODUODENOSCOPY (EGD) WITH PROPOFOL;  Surgeon: Lucilla Lame, MD;  Location: [facility];  Service: Endoscopy;  Laterality: N/A;   INSERTION OF MESH  2014   LEFT HEART CATHETERIZATION WITH CORONARY ANGIOGRAM N/A 08/30/2013   Procedure: LEFT HEART CATHETERIZATION WITH CORONARY ANGIOGRAM;  Surgeon: Troy Sine, MD;  Location: [facility] CATH LAB;  Service: Cardiovascular;  Laterality: N/A;   NEPHRECTOMY     REPLACEMENT TOTAL KNEE BILATERAL Bilateral 2018   2/18, 5/18 [facility]   ROUX-EN-Y GASTRIC BYPASS  04/27/2015   [facility]    TUBAL LIGATION     UMBILICAL HERNIA REPAIR      Social History:  reports that she quit smoking about 36 years ago. Her smoking use included cigarettes. She has never used smokeless tobacco. She reports that she does not drink alcohol or use drugs.  Allergies:  Allergies  Allergen Reactions   Lisinopril Cough    (Not in a hospital admission)    Physical Exam: Blood pressure (!) 163/104, pulse 62, temperature 99.1 F (37.3 C), temperature source Oral, resp. rate 19, height 5\' 5"  (1.651 m), weight 99.8 kg, SpO2 100 %. General: pleasant, somewhat obese black female who is laying in bed in obvious distress secondary to pain constantly moaning and moving around. HEENT: head is normocephalic, atraumatic.  Sclera are noninjected.  PERRL.  Ears and nose without any masses or lesions.  Mouth is pink and moist Heart: regular, rate, and rhythm.  Normal s1,s2. No obvious murmurs, gallops, or rubs noted.  Palpable radial and pedal pulses bilaterally Lungs: CTAB, no wheezes, rhonchi, or rales noted.  Respiratory effort nonlabored Abd: soft, ND, hypoactive BS, significantly tender in the epigastrium and some throughout her upper abdomen.  She will barely let me touch her abdomen to really palpate it due to pain.  no masses, hernias noted.  Unable to assess for organomegaly as she will not let me barely touch her abdomen MS: all 4 extremities are symmetrical with no cyanosis, clubbing, or edema. Skin: warm and dry with no masses, lesions, or rashes Psych: A&Ox3 with an appropriate affect.   Results for orders placed or performed during the hospital encounter of 06/16/18 (from the past 48 hour(s))  Lactic acid, plasma     Status: None   Collection Time: 06/16/18  5:00 AM  Result Value Ref Range   Lactic Acid, Venous 1.3 0.5 - 1.9 mmol/L    Comment: Performed at [facility], [place] [address], [place], [place] [address]  Comprehensive metabolic panel     Status: Abnormal    Collection Time: 06/16/18  5:00 AM  Result Value Ref Range   Sodium 138 135 - 145 mmol/L   Potassium 4.1 3.5 - 5.1 mmol/L   Chloride 108 98 - 111 mmol/L   CO2 21 (L) 22 - 32 mmol/L   Glucose, Bld 155 (H) 70 - 99 mg/dL   BUN 17 6 - 20 mg/dL   Creatinine, Ser 0.74 0.44 - 1.00 mg/dL   Calcium 9.3 8.9 - 10.3 mg/dL   Total Protein 7.3 6.5 - 8.1 g/dL   Albumin 3.8 3.5 - 5.0 g/dL   AST 26 15 - 41 U/L   ALT 30 0 - 44 U/L   Alkaline Phosphatase 83 38 - 126 U/L   Total Bilirubin 0.8 0.3 - 1.2 mg/dL   GFR  calc non Af Amer >60 >60 mL/min   GFR calc Af Amer >60 >60 mL/min   Anion gap 9 5 - 15    Comment: Performed at [facility], [place] [address], [place], [place] [address]  Lipase, blood     Status: None   Collection Time: 06/16/18  5:00 AM  Result Value Ref Range   Lipase 47 11 - 51 U/L    Comment: Performed at [facility], [place] [address], [place], [place] [address]  CBC with Differential/Platelet     Status: None   Collection Time: 06/16/18  5:05 AM  Result Value Ref Range   WBC 5.0 4.0 - 10.5 K/uL   RBC 4.52 3.87 - 5.11 MIL/uL   Hemoglobin 14.2 12.0 - 15.0 g/dL   HCT 42.7 36.0 - 46.0 %   MCV 94.5 80.0 - 100.0 fL   MCH 31.4 26.0 - 34.0 pg   MCHC 33.3 30.0 - 36.0 g/dL   RDW 13.2 11.5 - 15.5 %   Platelets 151 150 - 400 K/uL    Comment: PLATELET COUNT CONFIRMED BY SMEAR PLATELETS APPEAR ADEQUATE Immature Platelet Fraction may be clinically indicated, consider ordering this additional test [id number]    nRBC 0.0 0.0 - 0.2 %   Neutrophils Relative % 54 %   Neutro Abs 2.7 1.7 - 7.7 K/uL   Lymphocytes Relative 36 %   Lymphs Abs 1.8 0.7 - 4.0 K/uL   Monocytes Relative 8 %   Monocytes Absolute 0.4 0.1 - 1.0 K/uL   Eosinophils Relative 1 %   Eosinophils Absolute 0.1 0.0 - 0.5 K/uL   Basophils Relative 1 %   Basophils Absolute 0.1 0.0 - 0.1 K/uL   Immature Granulocytes 0 %   Abs Immature Granulocytes 0.01 0.00 - 0.07 K/uL    Comment: Performed  at [facility], [place] [address] [place], [place] [address]   Ct Abdomen Pelvis Wo Contrast  Result Date: 06/16/2018 CLINICAL DATA:  Abdominal pain after eating dinner. EXAM: CT ABDOMEN AND PELVIS WITHOUT CONTRAST TECHNIQUE: Multidetector CT imaging of the abdomen and pelvis was performed following the standard protocol without IV contrast. COMPARISON:  05/16/2018 FINDINGS: Lower chest:  Coronary calcification. Hepatobiliary: No concerning liver abnormality. Small capsular based calcification along the upper right lobe. Which likely accounts for mild prominence of the common bile duct Pancreas: Unremarkable. Spleen: Unremarkable. Adrenals/Urinary Tract: Negative adrenals. No hydronephrosis or stone. Left nephrectomy, reportedly for renal cell carcinoma unremarkable bladder. Stomach/Bowel: Gastric bypass. No obstruction. No evidence of bowel inflammation Vascular/Lymphatic: No acute vascular abnormality. No mass or adenopathy. Reproductive:Posterior uterine fibroid better depicted on postcontrast imaging comparison. Other: No ascites or pneumoperitoneum. Musculoskeletal: No acute abnormalities.  Lumbar spine degeneration. IMPRESSION: No acute finding.  Stable from 2019. Electronically Signed   By: Monte Fantasia M.D.   On: 06/16/2018 06:32   Dg Abdomen Acute W/chest  Result Date: 06/16/2018 CLINICAL DATA:  Patient states she began to have abdominal pain around 7:30 pm 03/16, pain has been getting worse since, states she is nauseous also, pain is upper abdomen EXAM: DG ABDOMEN ACUTE W/ 1V CHEST COMPARISON:  CT, 05/16/2017 FINDINGS: There is no bowel dilation to suggest obstruction. There is no free air. There are multiple bowel anastomosis staples in the left mid and upper abdomen. Surgical vascular clips are upper quadrant reflect a prior cholecystectomy. Increased stool is noted in the colon, mild-to-moderate in degree. No evidence of renal or ureteral stones. Soft tissues are otherwise  unremarkable.  Normal heart, mediastinum and hila.  Clear lungs. Skeletal structures are unremarkable. IMPRESSION: 1. No acute findings.  No evidence of bowel obstruction or free air. 2. Mild to moderate increase colonic stool. 3. Postsurgical changes from prior gastric bypass surgery. 4. No active disease of the chest. Electronically Signed   By: Lajean Manes M.D.   On: 06/16/2018 03:48      Assessment/Plan HTN - add IV meds Seizure disorder - likely convert oral meds to IV while NPO, may need medicine input to assist with this vs pharmacy consultation H/o RCC, s/p left nephrectomy H/o DVT 10 yrs ago   Acute abdominal pain, s/p roux-en-Y gastric bypass at [facility] 3 years ago Patient is s/p RYGB surgery at [facility] About 3 years ago.  She has lost over 200lbs since the timing of her surgery.  She presents today with acute onset of abdominal pain that she has never had before.  She denies any prior symptoms consistently with a marginal ulcer.  She takes Dexilant daily at home as well.  It is possible she could have an internal hernia despite a negative CT report.  Given the severity of her pain, she will likely require at least a diagnostic laparoscopy, possible laparotomy pending findings.  I will discuss this patient with Dr. Harlow Asa to make further plans regarding surgical intervention.  It is reassuring that her labs all appear normal at this time, but doesn't preclude a possible complication.  Remain NPO for now.   FEN - NPO/IVFs VTE - SCDs/chemical prophylaxis to start after surgery if this is scheduled ID - pending findings and decision for [place], [facility] Surgery 06/16/2018, 8:03 AM Pager: [phone]

## 2018-06-16 NOTE — ED Notes (Signed)
Patient transported to X-ray 

## 2018-06-16 NOTE — ED Notes (Signed)
Surgery PA at bedside.  

## 2018-06-16 NOTE — ED Notes (Signed)
Patient transported to CT 

## 2018-06-16 NOTE — Anesthesia Procedure Notes (Signed)
Procedure Name: Intubation Date/Time: 06/16/2018 10:49 AM Performed by: [place] Dreyah [place]rose C, CRNA Pre-anesthesia Checklist: Patient identified, Emergency Drugs available, Suction available and Patient being monitored Patient Re-evaluated:Patient Re-evaluated prior to induction Oxygen Delivery Method: Circle system utilized Preoxygenation: Pre-oxygenation with 100% oxygen Induction Type: IV induction, Rapid sequence and Cricoid Pressure applied Laryngoscope Size: Mac and 3 Grade View: Grade I Tube type: Oral Tube size: 7.0 mm Number of attempts: 1 Airway Equipment and Method: Stylet Placement Confirmation: ETT inserted through vocal cords under direct vision,  positive ETCO2 and breath sounds checked- equal and bilateral Secured at: 21 cm Tube secured with: Tape Dental Injury: Teeth and Oropharynx as per pre-operative assessment

## 2018-06-16 NOTE — ED Triage Notes (Signed)
Patient states she ate dinner around 1930. She states she took a bite and her abdomen started hurting real bad. She states that it has not gotten any better.

## 2018-06-16 NOTE — Anesthesia Preprocedure Evaluation (Addendum)
Anesthesia Evaluation  Patient identified by MRN, date of birth, ID band Patient awake    Reviewed: Allergy & Precautions, NPO status , Patient's Chart, lab work & pertinent test results  History of Anesthesia Complications Negative for: history of anesthetic complications  Airway Mallampati: II  TM Distance: >3 FB Neck ROM: Full    Dental no notable dental hx.    Pulmonary neg pulmonary ROS, sleep apnea , former smoker,    Pulmonary exam normal breath sounds clear to auscultation       Cardiovascular hypertension, Normal cardiovascular exam Rhythm:Regular Rate:Normal  Echo 2017 Study Conclusions  - Left ventricle: The cavity size was normal. Systolic function was   normal. The estimated ejection fraction was in the range of 60%   to 65%. Wall motion was normal; there were no regional wall   motion abnormalities. Doppler parameters are consistent with   abnormal left ventricular relaxation (grade 1 diastolic   dysfunction). - Left atrium: The atrium was normal in size. - Right ventricle: Systolic function was normal. - Pulmonary arteries: Systolic pressure was within the normal   range.  Impressions:  - Normal study.  Cath 2015: normal    Neuro/Psych PSYCHIATRIC DISORDERS Anxiety Depression negative neurological ROS     GI/Hepatic Neg liver ROS, hiatal hernia, GERD  ,S/p Gastric bypass   Endo/Other  diabetesMorbid obesity  Renal/GU Renal diseaseRenal cell Ca  negative genitourinary   Musculoskeletal negative musculoskeletal ROS (+)   Abdominal   Peds negative pediatric ROS (+)  Hematology negative hematology ROS (+)   Anesthesia Other Findings   Reproductive/Obstetrics negative OB ROS                           Anesthesia Physical  Anesthesia Plan  ASA: III and emergent  Anesthesia Plan: General   Post-op Pain Management:    Induction: Intravenous and Rapid  sequence  PONV Risk Score and Plan: 4 or greater and Ondansetron, Dexamethasone, Treatment may vary due to age or medical condition, Scopolamine patch - Pre-op and Diphenhydramine  Airway Management Planned: Oral ETT  Additional Equipment:   Intra-op Plan:   Post-operative Plan: Extubation in OR  Informed Consent: I have reviewed the patients History and Physical, chart, labs and discussed the procedure including the risks, benefits and alternatives for the proposed anesthesia with the patient or authorized representative who has indicated his/her understanding and acceptance.     Dental advisory given  Plan Discussed with: Surgeon, CRNA and Anesthesiologist  Anesthesia Plan Comments:        Anesthesia Quick Evaluation

## 2018-06-16 NOTE — ED Notes (Signed)
Unable to collect ekg due to IV attempt

## 2018-06-16 NOTE — ED Notes (Addendum)
Pt wiped down with pre-surgical wipes.  Gown changed and clean blanket given

## 2018-06-16 NOTE — ED Notes (Signed)
IV team at bedside 

## 2018-06-16 NOTE — ED Notes (Signed)
ED TO INPATIENT HANDOFF REPORT  ED Nurse Name and Phone #: Maddie RN  S Name/Age/Gender Denise Davis 53 y.o. female Room/Bed: WA19/WA19  Code Status   Code Status: Prior {Patient oriented to: A&Ox4 Home/SNF/Other Home  Is this baseline? Yes   Triage Complete: Triage complete  Chief Complaint abdominal pain   Triage Note Patient states she ate dinner around 1930. She states she took a bite and her abdomen started hurting real bad. She states that it has not gotten any better.   Allergies Allergies  Allergen Reactions  . Lisinopril Cough    Level of Care/Admitting Diagnosis ED Disposition    ED Disposition Condition Comment   Admit  Hospital Area: [facility] [[id number]]  Level of Care: Med-Surg [16]  Diagnosis: Acute abdominal pain [025852]  Admitting Physician: CCS, [place]  Attending Physician: CCS, MD [[id number]]  Bed request comments: 5w  PT Class (Do Not Modify): Observation [104]  PT Acc Code (Do Not Modify): Observation [10022]       B Medical/Surgery History Past Medical History:  Diagnosis Date  . Allergic rhinitis   . Anxiety   . Depression   . Diabetes mellitus    taken off meds in beginning fo 2018 dueto weight loss   . DVT of leg (deep venous thrombosis) ([place]) 12/2007   left leg, given warfarin for 6 mos  . GERD (gastroesophageal reflux disease)   . H/O hiatal hernia   . Hyperlipidemia   . Hypertension   . Morbid obesity ([place])   . Pericarditis    Diagnosed/treated at [facility]; per patient 4 episodes per cardiologist Dr. Edwin Dada persistant chest pain unclear if pericarditis verusus neuropsychogenic   . Renal cell cancer ([place]) 2009   S/p nephrectomy (left)  . Seizure disorder ([place])    (04/08/17)- None in over 1 yr, no meds.   Past Surgical History:  Procedure Laterality Date  . CHOLECYSTECTOMY N/A 05/02/2017   Procedure: LAPAROSCOPIC CHOLECYSTECTOMY WITH INTRAOPERATIVE CHOLANGIOGRAM;  Surgeon: Johnathan Hausen, MD;  Location:  [facility] ORS;  Service: General;  Laterality: N/A;  . ENDOMETRIAL ABLATION  2007  . ESOPHAGEAL MANOMETRY N/A 05/21/2016   Procedure: ESOPHAGEAL MANOMETRY (EM);  Surgeon: Lucilla Lame, MD;  Location: [facility] ENDOSCOPY;  Service: Endoscopy;  Laterality: N/A;  . ESOPHAGOGASTRODUODENOSCOPY (EGD) WITH PROPOFOL N/A 05/14/2016   Procedure: ESOPHAGOGASTRODUODENOSCOPY (EGD) WITH PROPOFOL;  Surgeon: Lucilla Lame, MD;  Location: [facility] ENDOSCOPY;  Service: Endoscopy;  Laterality: N/A;  . ESOPHAGOGASTRODUODENOSCOPY (EGD) WITH PROPOFOL N/A 04/10/2017   Procedure: ESOPHAGOGASTRODUODENOSCOPY (EGD) WITH PROPOFOL;  Surgeon: Lucilla Lame, MD;  Location: [place];  Service: Endoscopy;  Laterality: N/A;  . INSERTION OF MESH  2014  . LEFT HEART CATHETERIZATION WITH CORONARY ANGIOGRAM N/A 08/30/2013   Procedure: LEFT HEART CATHETERIZATION WITH CORONARY ANGIOGRAM;  Surgeon: Troy Sine, MD;  Location: [facility] CATH LAB;  Service: Cardiovascular;  Laterality: N/A;  . NEPHRECTOMY    . REPLACEMENT TOTAL KNEE BILATERAL Bilateral 2018   2/18, 5/18 [facility]  . ROUX-EN-Y GASTRIC BYPASS  04/27/2015   [facility]  . TUBAL LIGATION    . UMBILICAL HERNIA REPAIR       A IV Location/Drains/Wounds Patient Lines/Drains/Airways Status   Active Line/Drains/Airways    Name:   Placement date:   Placement time:   Site:   Days:   Peripheral IV 06/16/18 Right;Anterior Forearm   06/16/18    0817    Forearm   less than 1   Gastrostomy/Enterostomy PEG-jejunostomy LLQ   -    -  LLQ      Incision (Closed) 04/10/17 Throat Other (Comment)   04/10/17    0846     432   Incision (Closed) 05/02/17 Abdomen Other (Comment)   05/02/17    1409     410   Incision - 4 Ports Abdomen Umbilicus Right;Mid Right;Upper Left;Upper   05/02/17    1400     410          Intake/Output Last 24 hours No intake or output data in the 24 hours ending 06/16/18 0851  Labs/Imaging Results for orders placed or performed during the hospital encounter of 06/16/18 (from the past 48  hour(s))  Lactic acid, plasma     Status: None   Collection Time: 06/16/18  5:00 AM  Result Value Ref Range   Lactic Acid, Venous 1.3 0.5 - 1.9 mmol/L    Comment: Performed at [facility], [place] [address] [place], [place] [address]  Comprehensive metabolic panel     Status: Abnormal   Collection Time: 06/16/18  5:00 AM  Result Value Ref Range   Sodium 138 135 - 145 mmol/L   Potassium 4.1 3.5 - 5.1 mmol/L   Chloride 108 98 - 111 mmol/L   CO2 21 (L) 22 - 32 mmol/L   Glucose, Bld 155 (H) 70 - 99 mg/dL   BUN 17 6 - 20 mg/dL   Creatinine, Ser 0.74 0.44 - 1.00 mg/dL   Calcium 9.3 8.9 - 10.3 mg/dL   Total Protein 7.3 6.5 - 8.1 g/dL   Albumin 3.8 3.5 - 5.0 g/dL   AST 26 15 - 41 U/L   ALT 30 0 - 44 U/L   Alkaline Phosphatase 83 38 - 126 U/L   Total Bilirubin 0.8 0.3 - 1.2 mg/dL   GFR calc non Af Amer >60 >60 mL/min   GFR calc Af Amer >60 >60 mL/min   Anion gap 9 5 - 15    Comment: Performed at [facility], [place] [address], [place], [place] [address]  Lipase, blood     Status: None   Collection Time: 06/16/18  5:00 AM  Result Value Ref Range   Lipase 47 11 - 51 U/L    Comment: Performed at [facility], [place] [address] [place], [place] [address]  CBC with Differential/Platelet     Status: None   Collection Time: 06/16/18  5:05 AM  Result Value Ref Range   WBC 5.0 4.0 - 10.5 K/uL   RBC 4.52 3.87 - 5.11 MIL/uL   Hemoglobin 14.2 12.0 - 15.0 g/dL   HCT 42.7 36.0 - 46.0 %   MCV 94.5 80.0 - 100.0 fL   MCH 31.4 26.0 - 34.0 pg   MCHC 33.3 30.0 - 36.0 g/dL   RDW 13.2 11.5 - 15.5 %   Platelets 151 150 - 400 K/uL    Comment: PLATELET COUNT CONFIRMED BY SMEAR PLATELETS APPEAR ADEQUATE Immature Platelet Fraction may be clinically indicated, consider ordering this additional test [id number]    nRBC 0.0 0.0 - 0.2 %   Neutrophils Relative % 54 %   Neutro Abs 2.7 1.7 - 7.7 K/uL   Lymphocytes Relative 36 %   Lymphs Abs 1.8 0.7 - 4.0  K/uL   Monocytes Relative 8 %   Monocytes Absolute 0.4 0.1 - 1.0 K/uL   Eosinophils Relative 1 %   Eosinophils Absolute 0.1 0.0 - 0.5 K/uL   Basophils Relative 1 %   Basophils Absolute 0.1 0.0 - 0.1  K/uL   Immature Granulocytes 0 %   Abs Immature Granulocytes 0.01 0.00 - 0.07 K/uL    Comment: Performed at [facility], [place] [address], [place], [place] [address]   Ct Abdomen Pelvis Wo Contrast  Result Date: 06/16/2018 CLINICAL DATA:  Abdominal pain after eating dinner. EXAM: CT ABDOMEN AND PELVIS WITHOUT CONTRAST TECHNIQUE: Multidetector CT imaging of the abdomen and pelvis was performed following the standard protocol without IV contrast. COMPARISON:  05/16/2018 FINDINGS: Lower chest:  Coronary calcification. Hepatobiliary: No concerning liver abnormality. Small capsular based calcification along the upper right lobe. Which likely accounts for mild prominence of the common bile duct Pancreas: Unremarkable. Spleen: Unremarkable. Adrenals/Urinary Tract: Negative adrenals. No hydronephrosis or stone. Left nephrectomy, reportedly for renal cell carcinoma unremarkable bladder. Stomach/Bowel: Gastric bypass. No obstruction. No evidence of bowel inflammation Vascular/Lymphatic: No acute vascular abnormality. No mass or adenopathy. Reproductive:Posterior uterine fibroid better depicted on postcontrast imaging comparison. Other: No ascites or pneumoperitoneum. Musculoskeletal: No acute abnormalities.  Lumbar spine degeneration. IMPRESSION: No acute finding.  Stable from 2019. Electronically Signed   By: Monte Fantasia M.D.   On: 06/16/2018 06:32   Dg Abdomen Acute W/chest  Result Date: 06/16/2018 CLINICAL DATA:  Patient states she began to have abdominal pain around 7:30 pm 03/16, pain has been getting worse since, states she is nauseous also, pain is upper abdomen EXAM: DG ABDOMEN ACUTE W/ 1V CHEST COMPARISON:  CT, 05/16/2017 FINDINGS: There is no bowel dilation to suggest obstruction.  There is no free air. There are multiple bowel anastomosis staples in the left mid and upper abdomen. Surgical vascular clips are upper quadrant reflect a prior cholecystectomy. Increased stool is noted in the colon, mild-to-moderate in degree. No evidence of renal or ureteral stones. Soft tissues are otherwise unremarkable. Normal heart, mediastinum and hila.  Clear lungs. Skeletal structures are unremarkable. IMPRESSION: 1. No acute findings.  No evidence of bowel obstruction or free air. 2. Mild to moderate increase colonic stool. 3. Postsurgical changes from prior gastric bypass surgery. 4. No active disease of the chest. Electronically Signed   By: Lajean Manes M.D.   On: 06/16/2018 03:48    Pending Labs Unresulted Labs (From admission, onward)    Start     Ordered   06/16/18 0724  Troponin I - ONCE - STAT  ONCE - STAT,   STAT     06/16/18 0723   06/16/18 0321  CBC with Differential/Platelet  Once,   R     06/16/18 0321   Signed and Held  Creatinine, serum  (enoxaparin (LOVENOX)    CrCl >/= 30 ml/min)  Weekly,   R    Comments:  while on enoxaparin therapy   Question:  Specimen collection method  Answer:  IV Team=IV Team collect   Signed and Held   Signed and Held  CBC  Tomorrow morning,   R    Question:  Specimen collection method  Answer:  IV Team=IV Team collect   Signed and Held   Signed and Held  Basic metabolic panel  Tomorrow morning,   R    Question:  Specimen collection method  Answer:  IV Team=IV Team collect   Signed and Held          Vitals/Pain Today's Vitals   06/16/18 0301 06/16/18 0304 06/16/18 0621 06/16/18 0850  BP: (!) 154/100  (!) 163/104   Pulse: 77  62 87  Resp: (!) 24  19 16   Temp: 99.1 F (37.3 C)  TempSrc: Oral     SpO2: 93%  100% 99%  Weight: 99.8 kg     Height: 5\' 5"  (1.651 m)     PainSc:  10-Worst pain ever      Isolation Precautions No active isolations  Medications Medications  iopamidol (ISOVUE-300) 61 % injection (has no  administration in time range)  sodium chloride (PF) 0.9 % injection (has no administration in time range)  HYDROmorphone (DILAUDID) injection 1 mg (1 mg Intravenous Given 06/16/18 0413)  ondansetron (ZOFRAN) injection 4 mg (4 mg Intravenous Given 06/16/18 0415)  sodium chloride 0.9 % bolus 1,000 mL (1,000 mLs Intravenous New Bag/Given 06/16/18 0827)  HYDROmorphone (DILAUDID) injection 1 mg (1 mg Intravenous Given 06/16/18 0510)  HYDROmorphone (DILAUDID) injection 1 mg (1 mg Intravenous Given 06/16/18 0823)  ondansetron (ZOFRAN) injection 4 mg (4 mg Intravenous Given 06/16/18 0846)    Mobility walks Low fall risk   Focused Assessments Abdominal   R Recommendations: See Admitting Provider Note  Report given to:   Additional Notes:  N/A

## 2018-06-16 NOTE — Transfer of Care (Signed)
Immediate Anesthesia Transfer of Care Note  Patient: Denise Davis  Procedure(s) Performed: UPPER GI ENDOSCOPY (N/A Abdomen) LAPAROSCOPY DIAGNOSTIC (N/A )  Patient Location: PACU  Anesthesia Type:General  Level of Consciousness: awake, alert  and oriented  Airway & Oxygen Therapy: Patient Spontanous Breathing and Patient connected to face mask oxygen  Post-op Assessment: Report given to RN and Post -op Vital signs reviewed and stable  Post vital signs: Reviewed and stable  Last Vitals:  Vitals Value Taken Time  BP 154/83 06/16/2018 12:18 PM  Temp    Pulse 77 06/16/2018 12:23 PM  Resp 14 06/16/2018 12:23 PM  SpO2 100 % 06/16/2018 12:23 PM  Vitals shown include unvalidated device data.  Last Pain:  Vitals:   06/16/18 0942  TempSrc: Oral  PainSc: 7       Patients Stated Pain Goal: 0 (10/10/17 7588)  Complications: No apparent anesthesia complications

## 2018-06-16 NOTE — Interval H&P Note (Signed)
History and Physical Interval Note:  06/16/2018 10:34 AM  Denise Davis  has presented today for surgery, with the diagnosis of abdominal pain.  The various methods of treatment have been discussed with the patient and family. After consideration of risks, benefits and other options for treatment, the patient has consented to  Procedure(s): UPPER GI ENDOSCOPY (N/A) LAPAROSCOPY DIAGNOSTIC (N/A) as a surgical intervention.  The patient's history has been reviewed, patient examined, no change in status, stable for surgery.  I have reviewed the patient's chart and labs.  Questions were answered to the patient's satisfaction.     [name]

## 2018-06-16 NOTE — Anesthesia Postprocedure Evaluation (Signed)
Anesthesia Post Note  Patient: NIJAE DOYEL  Procedure(s) Performed: UPPER GI ENDOSCOPY (N/A Abdomen) LAPAROSCOPY DIAGNOSTIC (N/A )     Patient location during evaluation: PACU Anesthesia Type: General Level of consciousness: sedated Pain management: pain level controlled Vital Signs Assessment: post-procedure vital signs reviewed and stable Respiratory status: spontaneous breathing and respiratory function stable Cardiovascular status: stable Postop Assessment: no apparent nausea or vomiting Anesthetic complications: no    Last Vitals:  Vitals:   06/16/18 1624 06/16/18 1714  BP: (!) 180/97 (!) 143/85  Pulse: 73 77  Resp: 18 18  Temp: 36.9 C 36.8 C  SpO2: 100% 100%    Last Pain:  Vitals:   06/16/18 1714  TempSrc: Oral  PainSc:                  Shyra Emile DANIEL

## 2018-06-16 NOTE — Op Note (Signed)
Denise Davis  Sep 04, 1965 16 June 2018    PCP:  Leone Haven, MD   Surgeon: Kaylyn Lim, MD, FACS  Asst:  Armandina Gemma, MD, FACS  Anes:  general  Preop Dx: Severe upper abdominal pain Postop Dx: Band obstruction proximal to jejunojejunostomy with dilated lacteals  Procedure: Upper endoscopy, laparoscopy with enterolysis of obstructing band Location Surgery: [facility] 4 Complications: None   EBL:   minimal cc  Drains: none  Description of Procedure:  The patient was taken to OR four.  After anesthesia was administered and the patient was prepped  with Technicare and a timeout was performed.  Upper endoscopy was first performed.  The endoscope was passed in the esophagus down to the EG junction which was at about 35 cm.  A small gastric pouch was visualized and the outlet appeared to be a circular staple gastrojejunostomy scope was passed and across the anastomosis and the small intestinal mucosa was examined and no marginal ulcers were noted.  Gastric pouch similarly had no ulceration.  The distention was decompressed and the scope was withdrawn.  After a second timeout and the patient was prepped with Technicare and draped sterilely enter the abdomen to the right upper quadrant use a 5 mm Optiview without difficulty.  3 other 5 mm trochars were placed over on the left side the abdomen the patient was tilted to the left side.  Terminal ileum was visualized with a free normal-appearing appendix and the terminal ileum was normal.  I then marched my way backward passing the bowel piece by piece with a Glassman clamps and walked all the way back to the where the common channel came off the jejunojejunostomy.  At that point we looked at the Roux-en-Y limb and there was a huge dilated quite distended area in the mesentery consistent with a dilated lacteal.  In addition the bowel appeared to be pinched down by band adhesive band and I lysed that and that freed up this loop.  It was felt this was  the cause of her pain in the obstruction.  We ran the Roux limb up to the gastrojejunostomy.  No other anomalies were noted in the trocar sites were injected with Marcaine and closed with of Monocryl and Dermabond.  Patient was taken to recovery room in satisfactory condition.  The patient tolerated the procedure well and was taken to the PACU in stable condition.     Matt B. Hassell Done, [place], [facility], [place]

## 2018-06-17 ENCOUNTER — Encounter (HOSPITAL_COMMUNITY): Payer: Self-pay | Admitting: Surgery

## 2018-06-17 ENCOUNTER — Ambulatory Visit: Payer: Self-pay | Admitting: Gastroenterology

## 2018-06-17 DIAGNOSIS — Z8249 Family history of ischemic heart disease and other diseases of the circulatory system: Secondary | ICD-10-CM | POA: Diagnosis not present

## 2018-06-17 DIAGNOSIS — E785 Hyperlipidemia, unspecified: Secondary | ICD-10-CM | POA: Diagnosis present

## 2018-06-17 DIAGNOSIS — Z811 Family history of alcohol abuse and dependence: Secondary | ICD-10-CM | POA: Diagnosis not present

## 2018-06-17 DIAGNOSIS — I10 Essential (primary) hypertension: Secondary | ICD-10-CM | POA: Diagnosis present

## 2018-06-17 DIAGNOSIS — M109 Gout, unspecified: Secondary | ICD-10-CM | POA: Diagnosis present

## 2018-06-17 DIAGNOSIS — Z7951 Long term (current) use of inhaled steroids: Secondary | ICD-10-CM | POA: Diagnosis not present

## 2018-06-17 DIAGNOSIS — Z8261 Family history of arthritis: Secondary | ICD-10-CM | POA: Diagnosis not present

## 2018-06-17 DIAGNOSIS — K449 Diaphragmatic hernia without obstruction or gangrene: Secondary | ICD-10-CM | POA: Diagnosis present

## 2018-06-17 DIAGNOSIS — Z87891 Personal history of nicotine dependence: Secondary | ICD-10-CM | POA: Diagnosis not present

## 2018-06-17 DIAGNOSIS — Z803 Family history of malignant neoplasm of breast: Secondary | ICD-10-CM | POA: Diagnosis not present

## 2018-06-17 DIAGNOSIS — K56609 Unspecified intestinal obstruction, unspecified as to partial versus complete obstruction: Secondary | ICD-10-CM | POA: Diagnosis present

## 2018-06-17 DIAGNOSIS — R1013 Epigastric pain: Secondary | ICD-10-CM | POA: Diagnosis present

## 2018-06-17 DIAGNOSIS — Z833 Family history of diabetes mellitus: Secondary | ICD-10-CM | POA: Diagnosis not present

## 2018-06-17 DIAGNOSIS — F329 Major depressive disorder, single episode, unspecified: Secondary | ICD-10-CM | POA: Diagnosis present

## 2018-06-17 DIAGNOSIS — Z86718 Personal history of other venous thrombosis and embolism: Secondary | ICD-10-CM | POA: Diagnosis not present

## 2018-06-17 DIAGNOSIS — Z888 Allergy status to other drugs, medicaments and biological substances status: Secondary | ICD-10-CM | POA: Diagnosis not present

## 2018-06-17 DIAGNOSIS — Z79899 Other long term (current) drug therapy: Secondary | ICD-10-CM | POA: Diagnosis not present

## 2018-06-17 DIAGNOSIS — Z7982 Long term (current) use of aspirin: Secondary | ICD-10-CM | POA: Diagnosis not present

## 2018-06-17 DIAGNOSIS — Z85528 Personal history of other malignant neoplasm of kidney: Secondary | ICD-10-CM | POA: Diagnosis not present

## 2018-06-17 DIAGNOSIS — K219 Gastro-esophageal reflux disease without esophagitis: Secondary | ICD-10-CM | POA: Diagnosis present

## 2018-06-17 DIAGNOSIS — J309 Allergic rhinitis, unspecified: Secondary | ICD-10-CM | POA: Diagnosis present

## 2018-06-17 DIAGNOSIS — K660 Peritoneal adhesions (postprocedural) (postinfection): Secondary | ICD-10-CM | POA: Diagnosis present

## 2018-06-17 DIAGNOSIS — Z9884 Bariatric surgery status: Secondary | ICD-10-CM | POA: Diagnosis not present

## 2018-06-17 DIAGNOSIS — F419 Anxiety disorder, unspecified: Secondary | ICD-10-CM | POA: Diagnosis present

## 2018-06-17 DIAGNOSIS — Z9049 Acquired absence of other specified parts of digestive tract: Secondary | ICD-10-CM | POA: Diagnosis not present

## 2018-06-17 LAB — BASIC METABOLIC PANEL
Anion gap: 7 (ref 5–15)
BUN: 14 mg/dL (ref 6–20)
CHLORIDE: 108 mmol/L (ref 98–111)
CO2: 23 mmol/L (ref 22–32)
Calcium: 8.6 mg/dL — ABNORMAL LOW (ref 8.9–10.3)
Creatinine, Ser: 0.76 mg/dL (ref 0.44–1.00)
GFR calc Af Amer: >60 mL/min (ref >60)
GFR calc non Af Amer: >60 mL/min (ref >60)
Glucose, Bld: 114 mg/dL — ABNORMAL HIGH (ref 70–99)
POTASSIUM: 4 mmol/L (ref 3.5–5.1)
Sodium: 138 mmol/L (ref 135–145)

## 2018-06-17 LAB — GLUCOSE, CAPILLARY
Glucose-Capillary: 107 mg/dL — ABNORMAL HIGH (ref 70–99)
Glucose-Capillary: 99 mg/dL (ref 70–99)
Glucose-Capillary: 121 mg/dL — ABNORMAL HIGH (ref 70–99)
Glucose-Capillary: 68 mg/dL — ABNORMAL LOW (ref 70–99)
Glucose-Capillary: 87 mg/dL (ref 70–99)

## 2018-06-17 LAB — CBC
HCT: 42.7 % (ref 36.0–46.0)
HEMOGLOBIN: 13 g/dL (ref 12.0–15.0)
MCH: 30.7 pg (ref 26.0–34.0)
MCHC: 30.4 g/dL (ref 30.0–36.0)
MCV: 100.7 fL — ABNORMAL HIGH (ref 80.0–100.0)
NRBC: 0 % (ref 0.0–0.2)
Platelets: 198 10*3/uL (ref 150–400)
RBC: 4.24 MIL/uL (ref 3.87–5.11)
RDW: 13.6 % (ref 11.5–15.5)
WBC: 4.7 10*3/uL (ref 4.0–10.5)

## 2018-06-17 MED ORDER — HYDROMORPHONE HCL 1 MG/ML IJ SOLN
1.0000 mg | INTRAMUSCULAR | Status: DC | PRN
  Administered 2018-06-17 – 2018-06-18 (×2): 1 mg via INTRAVENOUS
  Filled 2018-06-17 (×2): qty 1

## 2018-06-17 MED ORDER — OXYCODONE HCL 5 MG PO TABS
5.0000 mg | ORAL_TABLET | ORAL | Status: DC | PRN
  Administered 2018-06-17 – 2018-06-19 (×11): 10 mg via ORAL
  Filled 2018-06-17 (×11): qty 2

## 2018-06-17 MED ORDER — ACETAMINOPHEN 500 MG PO TABS
1000.0000 mg | ORAL_TABLET | Freq: Three times a day (TID) | ORAL | Status: DC
  Administered 2018-06-17 – 2018-06-18 (×5): 1000 mg via ORAL
  Filled 2018-06-17 (×6): qty 2

## 2018-06-17 NOTE — Progress Notes (Addendum)
1 Day Post-Op    CC: Severe abdominal pain  Subjective: No flatus, no BM.  She had some nausea with clear liquids last evening.  Port sites all look good.  She has been up out of bed.  She can move about 1000 on incentive spirometer.  Objective: Vital signs in last 24 hours: Temp:  [97.6 F (36.4 C)-98.6 F (37 C)] 98.3 F (36.8 C) (03/18 0518) Pulse Rate:  [52-94] 54 (03/18 0521) Resp:  [13-18] 18 (03/18 0518) BP: (116-180)/(55-109) 139/78 (03/18 0521) SpO2:  [96 %-100 %] 100 % (03/18 0518) Weight:  [99.8 kg] 99.8 kg (03/17 0942)  afebrile, VSS Labs OK K+ 4.0 370 PO 1500 IV 1000 urine  No Bm recorded    Intake/Output from previous day: 03/17 0701 - 03/18 0700 In: 1877.8 [P.O.:370; I.V.:1307.8; IV Piggyback:200] Out: 1000 [Urine:1000] Intake/Output this shift: No intake/output data recorded.  General appearance: alert, cooperative and no distress Resp: clear to auscultation bilaterally GI: Soft, sore, sites all look fine.  No flatus or BM so far.  Some nausea with clears yesterday.  Lab Results:  Recent Labs    06/16/18 0505 06/17/18 0426  WBC 5.0 4.7  HGB 14.2 13.0  HCT 42.7 42.7  PLT 151 198    BMET Recent Labs    06/16/18 0500 06/17/18 0426  NA 138 138  K 4.1 4.0  CL 108 108  CO2 21* 23  GLUCOSE 155* 114*  BUN 17 14  CREATININE 0.74 0.76  CALCIUM 9.3 8.6*   PT/INR No results for input(s): LABPROT, INR in the last 72 hours.  Recent Labs  Lab 06/16/18 0500  AST 26  ALT 30  ALKPHOS 83  BILITOT 0.8  PROT 7.3  ALBUMIN 3.8     Lipase     Component Value Date/Time   LIPASE 47 06/16/2018 0500   LIPASE 180 04/18/2014 1853   Prior to Admission medications   Medication Sig Start Date End Date Taking? Authorizing Provider  acarbose (PRECOSE) 25 MG tablet Take 25 mg by mouth 3 (three) times daily with meals.   Yes [provider]  albuterol (VENTOLIN HFA) 108 (90 Base) MCG/ACT inhaler Inhale 2 puffs into the lungs every 6 (six)  hours as needed for wheezing or shortness of breath. 12/04/17  Yes Guse, Jacquelynn Cree, FNP  aspirin 81 MG chewable tablet Chew 81 mg by mouth daily.   Yes [provider]  budesonide-formoterol (SYMBICORT) 160-4.5 MCG/ACT inhaler Inhale 2 puffs into the lungs 2 (two) times daily. Rinse mouth after use 12/09/17  Yes Laverle Hobby, MD  butalbital-acetaminophen-caffeine (FIORICET, ESGIC) 50-325-40 MG tablet Take 1 tablet by mouth every 6 (six) hours as needed for headache. Do not refill in less than 30 days 01/19/18  Yes Dennie Bible, NP  carvedilol (COREG) 12.5 MG tablet TAKE 1 TABLET BY MOUTH TWICE DAILY WITH MEALS Patient taking differently: Take 12.5 mg by mouth 2 (two) times daily with a meal.  05/19/18  Yes Gollan, Kathlene November, MD  Cholecalciferol (VITAMIN D-3) 1000 units CAPS Take 1 capsule (1,000 Units total) by mouth daily. Reported on 06/16/2015 10/02/15  Yes Leone Haven, MD  clotrimazole-betamethasone (LOTRISONE) cream Apply 1 application topically 2 (two) times daily. 12/04/17  Yes Guse, Jacquelynn Cree, FNP  dexlansoprazole (DEXILANT) 60 MG capsule Take 1 capsule (60 mg total) by mouth daily. 05/01/18  Yes Lucilla Lame, MD  DULoxetine (CYMBALTA) 30 MG capsule TAKE 1 CAPSULE BY MOUTH ONCE DAILY Patient taking differently: Take 30 mg by  mouth daily.  03/02/18  Yes Leone Haven, MD  fluticasone (FLONASE) 50 MCG/ACT nasal spray Place 2 sprays into both nostrils daily. 12/04/17  Yes Guse, Jacquelynn Cree, FNP  hydrOXYzine (VISTARIL) 50 MG capsule Take 1 capsule (50 mg total) by mouth 3 (three) times daily as needed for itching. 01/07/18  Yes Guse, Jacquelynn Cree, FNP  LamoTRIgine 200 MG TB24 24 hour tablet Take two tablets at bedtime. Patient taking differently: Take 400 mg by mouth at bedtime. Take two tablets at bedtime. 10/01/17  Yes Marcial Pacas, MD  loratadine (CLARITIN) 10 MG tablet Take 1 tablet (10 mg total) by mouth daily. 06/27/17  Yes Leone Haven, MD  Melatonin 3 MG TABS Take 9 mg by  mouth.  09/20/15  Yes [provider]  Multiple Vitamins-Minerals (MULTIVITAMIN WITH MINERALS) tablet Take 1 tablet by mouth daily. Reported on 06/16/2015   Yes [provider]  ondansetron (ZOFRAN-ODT) 4 MG disintegrating tablet DISSOLVE 1 TABLET IN MOUTH EVERY 8 HOURS AS NEEDED FOR NAUSEA AND VOMITING Patient taking differently: Take 4 mg by mouth every 8 (eight) hours as needed for nausea or vomiting. DISSOLVE 1 TABLET IN MOUTH EVERY 8 HOURS AS NEEDED FOR NAUSEA AND VOMITING 05/19/18  Yes Leone Haven, MD  promethazine (PHENERGAN) 25 MG tablet Take 1 tablet (25 mg total) by mouth every 8 (eight) hours as needed for nausea or vomiting. 05/01/18  Yes Lucilla Lame, MD  sertraline (ZOLOFT) 50 MG tablet Take 1 tablet (50 mg total) by mouth daily. 12/04/17  Yes Guse, Jacquelynn Cree, FNP  SUMAtriptan (IMITREX) 50 MG tablet Take 1 tablet (50 mg total) by mouth every 2 (two) hours as needed for migraine. May repeat in 2 hours if headache persists or recurs. 01/15/18  Yes Dennie Bible, NP  telmisartan (MICARDIS) 40 MG tablet TAKE 1 TABLET BY MOUTH ONCE DAILY Patient taking differently: Take 40 mg by mouth daily.  05/19/18  Yes Leone Haven, MD  Tetrahydrozoline HCl (VISINE OP) Apply 2 drops to eye daily as needed (itchy eyes).   Yes [provider]  triamcinolone cream (KENALOG) 0.1 % APPLY TOPICALLY TWO TIMES DAILY Patient taking differently: Apply 1 application topically 2 (two) times daily.  02/17/17  Yes Leone Haven, MD  vitamin B-12 (CYANOCOBALAMIN) 500 MCG tablet Take 500 mcg by mouth daily.  10/02/15  Yes [provider]  amoxicillin-clavulanate (AUGMENTIN) 875-125 MG tablet Take 1 tablet by mouth. For dental 09/26/16   [provider]  calcium-vitamin D 250-100 MG-UNIT tablet Take 1 tablet by mouth 2 (two) times daily. Reported on 06/16/2015 Patient not taking: Reported on 06/16/2018 10/02/15   Leone Haven, MD  fexofenadine [facility]  ALLERGY) 180 MG tablet Take 1 tablet (180 mg total) by mouth daily. Patient not taking: Reported on 06/16/2018 12/08/17   Jodelle Green, FNP  Galcanezumab-gnlm (EMGALITY) 120 MG/ML SOAJ Inject 120 mg into the skin every 30 (thirty) days. Patient not taking: Reported on 06/16/2018 01/26/18   Dennie Bible, NP  Galcanezumab-gnlm [facility]) 120 MG/ML SOAJ Inject 240 mg into the skin every 30 (thirty) days. This is the loading dose 01/26/18   Dennie Bible, NP  Galcanezumab-gnlm [facility]) 120 MG/ML SOSY Inject 120 mg into the skin every 30 (thirty) days.    [provider]  montelukast (SINGULAIR) 10 MG tablet Take 1 tablet (10 mg total) by mouth at bedtime. Patient not taking: Reported on 06/16/2018 01/07/18   Jodelle Green, FNP  nitroGLYCERIN (NITROSTAT) 0.4  MG SL tablet Place 1 tablet (0.4 mg total) under the tongue every 5 (five) minutes as needed for chest pain. (may repeat every 5 minutes but seek medical help if pain persists after 3 tablets) 04/23/16   Leone Haven, MD      Medications: . carvedilol  12.5 mg Oral BID WC  . DULoxetine  30 mg Oral Daily  . enoxaparin (LOVENOX) injection  40 mg Subcutaneous Q24H  . insulin aspart  0-9 Units Subcutaneous TID WC  . irbesartan  150 mg Oral Daily  . lamoTRIgine  200 mg Oral BID  . pantoprazole  40 mg Oral Daily   . dextrose 5 % and 0.45 % NaCl with KCl 20 mEq/L 75 mL/hr at 06/17/18 0524   Anti-infectives (From admission, onward)   Start     Dose/Rate Route Frequency Ordered Stop   06/16/18 1015  ceFAZolin (ANCEF) IVPB 2g/100 mL premix     2 g 200 mL/hr over 30 Minutes Intravenous On call to O.R. 06/16/18 1000 06/16/18 1051   06/16/18 1003  ceFAZolin (ANCEF) 2-4 GM/100ML-% IVPB    Note to Pharmacy:  Georgena Spurling   : cabinet override      06/16/18 1003 06/16/18 1051      Assessment/Plan Hypertension Hx seizure disorder Hx DVT Hx RCC with left nephrectomy   Acute abdominal pain, s/p roux-en-Y gastric  bypass Band obstruction proximal to jejunojejunostomy with dilated lacteals Upper endoscopy, laparoscopy with enterolysis of obstructing band, 06/16/18, Dr. Hassell Done  FEN: Clear diet/IV fluids ID:  Ancef preop DVT:  Lovenox Follow up:  Dr. Hassell Done   Plan: Mobilize.  Limit narcotics, scheduled Tylenol, oxycodone, Dilaudid.  No NSAID S secondary to prior nephrectomy.  Advance diet as tolerated.  I will leave her on clears right now until she tells me she is doing better with them.    LOS: 0 days    Denise Davis 06/17/2018 [phone]

## 2018-06-18 LAB — BASIC METABOLIC PANEL
Anion gap: 5 (ref 5–15)
BUN: 9 mg/dL (ref 6–20)
CHLORIDE: 106 mmol/L (ref 98–111)
CO2: 26 mmol/L (ref 22–32)
CREATININE: 0.84 mg/dL (ref 0.44–1.00)
Calcium: 8.6 mg/dL — ABNORMAL LOW (ref 8.9–10.3)
GFR calc Af Amer: >60 mL/min (ref >60)
GFR calc non Af Amer: >60 mL/min (ref >60)
Glucose, Bld: 82 mg/dL (ref 70–99)
Potassium: 4.4 mmol/L (ref 3.5–5.1)
Sodium: 137 mmol/L (ref 135–145)

## 2018-06-18 LAB — CBC
HCT: 38 % (ref 36.0–46.0)
Hemoglobin: 12.2 g/dL (ref 12.0–15.0)
MCH: 31.6 pg (ref 26.0–34.0)
MCHC: 32.1 g/dL (ref 30.0–36.0)
MCV: 98.4 fL (ref 80.0–100.0)
Platelets: 159 10*3/uL (ref 150–400)
RBC: 3.86 MIL/uL — ABNORMAL LOW (ref 3.87–5.11)
RDW: 13.2 % (ref 11.5–15.5)
WBC: 2.5 10*3/uL — ABNORMAL LOW (ref 4.0–10.5)
nRBC: 0 % (ref 0.0–0.2)

## 2018-06-18 LAB — GLUCOSE, CAPILLARY
GLUCOSE-CAPILLARY: 125 mg/dL — AB (ref 70–99)
Glucose-Capillary: 135 mg/dL — ABNORMAL HIGH (ref 70–99)
Glucose-Capillary: 134 mg/dL — ABNORMAL HIGH (ref 70–99)
Glucose-Capillary: 86 mg/dL (ref 70–99)

## 2018-06-18 LAB — TROPONIN I

## 2018-06-18 MED ORDER — FAMOTIDINE 20 MG PO TABS
20.0000 mg | ORAL_TABLET | Freq: Two times a day (BID) | ORAL | Status: DC
  Administered 2018-06-18 – 2018-06-25 (×15): 20 mg via ORAL
  Filled 2018-06-18 (×15): qty 1

## 2018-06-18 MED ORDER — ALUM & MAG HYDROXIDE-SIMETH 200-200-20 MG/5ML PO SUSP
30.0000 mL | ORAL | Status: DC | PRN
  Administered 2018-06-18: 30 mL via ORAL
  Filled 2018-06-18: qty 30

## 2018-06-18 MED ORDER — HYDROMORPHONE HCL 1 MG/ML IJ SOLN
1.0000 mg | INTRAMUSCULAR | Status: DC | PRN
  Administered 2018-06-18 – 2018-06-22 (×11): 1 mg via INTRAVENOUS
  Filled 2018-06-18 (×11): qty 1

## 2018-06-18 NOTE — Progress Notes (Addendum)
2 Days Post-Op    CC: Acute abdominal pain  Subjective: Complains of mid chest pain, she does have some history of GERD she is normally on something for this. No prior history of chest pain or cardiac issues.  She does not look uncomfortable or in any distress.  But says she has some ongoing discomfort.  She is on Dexilant at home.  Also complaining of right shoulder pain and thinks she tore a muscle pulling herself up. Objective: Vital signs in last 24 hours: Temp:  [98 F (36.7 C)-98.4 F (36.9 C)] 98 F (36.7 C) (03/18 2106) Pulse Rate:  [55-64] 55 (03/18 2106) Resp:  [18] 18 (03/18 2106) BP: (121-146)/(69-79) 145/69 (03/18 2106) SpO2:  [97 %-100 %] 97 % (03/18 2106) Last BM Date: 06/14/18 1480 p.o.  2052 IV Urine 4500 Afebrile vital signs are stable No labs today Pain: Tylenol 1 g every 8 Dilaudid x3, Dilaudid x2, oxycodone x3(30 mg) +20 mg since midnight  Intake/Output from previous day: 03/18 0701 - 03/19 0700 In: 3532 [P.O.:1480; I.V.:2052] Out: 4500 [Urine:4500] Intake/Output this shift: No intake/output data recorded.  General appearance: alert, cooperative and no distress Resp: clear to auscultation bilaterally Cardio: regular rate and rhythm, S1, S2 normal, no murmur, click, rub or gallop GI: Soft, normal postop tenderness.  Still complaining of abdominal pain tolerating clear liquids.  No flatus or BM so far.  Lab Results:  Recent Labs    06/16/18 0505 06/17/18 0426  WBC 5.0 4.7  HGB 14.2 13.0  HCT 42.7 42.7  PLT 151 198    BMET Recent Labs    06/16/18 0500 06/17/18 0426  NA 138 138  K 4.1 4.0  CL 108 108  CO2 21* 23  GLUCOSE 155* 114*  BUN 17 14  CREATININE 0.74 0.76  CALCIUM 9.3 8.6*   PT/INR No results for input(s): LABPROT, INR in the last 72 hours.  Recent Labs  Lab 06/16/18 0500  AST 26  ALT 30  ALKPHOS 83  BILITOT 0.8  PROT 7.3  ALBUMIN 3.8     Lipase     Component Value Date/Time   LIPASE 47 06/16/2018 0500   LIPASE  180 04/18/2014 1853     Medications: . acetaminophen  1,000 mg Oral Q8H  . carvedilol  12.5 mg Oral BID WC  . DULoxetine  30 mg Oral Daily  . enoxaparin (LOVENOX) injection  40 mg Subcutaneous Q24H  . insulin aspart  0-9 Units Subcutaneous TID WC  . irbesartan  150 mg Oral Daily  . lamoTRIgine  200 mg Oral BID  . pantoprazole  40 mg Oral Daily    Assessment/Plan  Hypertension Hx seizure disorder Hx DVT Hx RCC with left nephrectomy Complains of chest pain/Hx GERD   Acute abdominal pain, s/p roux-en-Y gastric bypass Band obstruction proximal to jejunojejunostomy with dilated lacteals Upper endoscopy, laparoscopy with enterolysis of obstructing band, 06/16/18, Dr. Hassell Done  FEN: Clear diet/IV fluids ID:  Ancef preop DVT:  Lovenox Follow up:  Dr. Hassell Done  Plan: Advance diet, I will check an EKG and troponin x 1  Add Pepcid and Mylanta now.  See if her symptoms resolve.  If there is no improvement will obtain a Medicine consult for evaluation.  EKG shows sinus rhythm, sinus bradycardia with a rate of 53, PR interval 154 ms.  QTc 3.84 ms.  There are no ST or T wave changes.  Labs show a stable CBC WBC is actually a little low at 2.5.  BMP is normal  troponins less than 0.03.  LOS: 1 day    Jamarious Febo 06/18/2018 [phone]

## 2018-06-19 LAB — GLUCOSE, CAPILLARY
Glucose-Capillary: 98 mg/dL (ref 70–99)
Glucose-Capillary: 57 mg/dL — ABNORMAL LOW (ref 70–99)
Glucose-Capillary: 113 mg/dL — ABNORMAL HIGH (ref 70–99)
Glucose-Capillary: 79 mg/dL (ref 70–99)
Glucose-Capillary: 86 mg/dL (ref 70–99)

## 2018-06-19 MED ORDER — POLYETHYLENE GLYCOL 3350 17 G PO PACK
17.0000 g | PACK | Freq: Every day | ORAL | Status: DC
  Administered 2018-06-19 – 2018-06-25 (×7): 17 g via ORAL
  Filled 2018-06-19 (×8): qty 1

## 2018-06-19 MED ORDER — BISACODYL 10 MG RE SUPP
10.0000 mg | Freq: Once | RECTAL | Status: AC
  Administered 2018-06-19: 10 mg via RECTAL
  Filled 2018-06-19: qty 1

## 2018-06-19 MED ORDER — ACETAMINOPHEN 500 MG PO TABS
1000.0000 mg | ORAL_TABLET | Freq: Four times a day (QID) | ORAL | Status: DC
  Administered 2018-06-19 – 2018-06-22 (×8): 1000 mg via ORAL
  Filled 2018-06-19 (×9): qty 2

## 2018-06-19 NOTE — Progress Notes (Signed)
3 Days Post-Op    CC: Abdominal pain  Subjective: Still complaining of pain, anxious about eating, denies any flatus or BM.  Her abdomen is soft, she has bowel sounds.  Port sites all look good.  Objective: Vital signs in last 24 hours: Temp:  [98.4 F (36.9 C)-98.7 F (37.1 C)] 98.6 F (37 C) (03/20 0516) Pulse Rate:  [55-56] 55 (03/20 0516) Resp:  [16-18] 16 (03/20 0516) BP: (120-156)/(66-90) 156/86 (03/20 0516) SpO2:  [100 %] 100 % (03/20 0516) Last BM Date: 06/14/18 1320 p.o. 1643 IV 2800 urine No BM recorded Afebrile vital signs are stable blood pressure slightly elevated at times. No labs. Pain: Tylenol 1 g every 8 Dilaudid 1 mg x2 yesterday oxycodone 10 mg x 4 yesterday, 20 mg since 0 300 this a.m. Intake/Output from previous day: 03/19 0701 - 03/20 0700 In: 2963.9 [P.O.:1320; I.V.:1643.9] Out: 2800 [Urine:2800] Intake/Output this shift: No intake/output data recorded.  General appearance: alert, cooperative and no distress Resp: clear to auscultation bilaterally GI: Soft, still complaining of pain, she is not distended.  Port sites all look good.  He denies flatus or BM.  Lab Results:  Recent Labs    06/17/18 0426 06/18/18 0938  WBC 4.7 2.5*  HGB 13.0 12.2  HCT 42.7 38.0  PLT 198 159    BMET Recent Labs    06/17/18 0426 06/18/18 0938  NA 138 137  K 4.0 4.4  CL 108 106  CO2 23 26  GLUCOSE 114* 82  BUN 14 9  CREATININE 0.76 0.84  CALCIUM 8.6* 8.6*   PT/INR No results for input(s): LABPROT, INR in the last 72 hours.  Recent Labs  Lab 06/16/18 0500  AST 26  ALT 30  ALKPHOS 83  BILITOT 0.8  PROT 7.3  ALBUMIN 3.8     Lipase     Component Value Date/Time   LIPASE 47 06/16/2018 0500   LIPASE 180 04/18/2014 1853     Medications: . acetaminophen  1,000 mg Oral Q8H  . carvedilol  12.5 mg Oral BID WC  . DULoxetine  30 mg Oral Daily  . enoxaparin (LOVENOX) injection  40 mg Subcutaneous Q24H  . famotidine  20 mg Oral BID  . insulin  aspart  0-9 Units Subcutaneous TID WC  . irbesartan  150 mg Oral Daily  . lamoTRIgine  200 mg Oral BID  . pantoprazole  40 mg Oral Daily    Assessment/Plan Hypertension Hx seizure disorder Hx DVT Hx RCC with left nephrectomy Complains of chest pain/Hx GERD   Acute abdominal pain, s/p roux-en-Y gastric bypass Band obstruction proximal to jejunojejunostomy with dilated lacteals Upper endoscopy, laparoscopy with enterolysis of obstructing band, 06/16/18, Dr. Hassell Done POD #3  FEN: IV fluids/full liquids =>> soft diet this a.m. ID: Ancef preop DVT: Lovenox Follow up: Dr. Hassell Done  Plan: Dulcolax suppository/MiraLAX, increased ambulation.  I will increase her Tylenol, she cannot have NSAIDS secondary to her prior nephrectomy.  She asked for something to sleep and I recommended she get up and walk if she could not sleep.  Follow-up with the office and Dr. Hassell Done is being arranged.  Charge instructions are in the AVS.       LOS: 2 days    Dayten Juba 06/19/2018 [phone]

## 2018-06-20 LAB — GLUCOSE, CAPILLARY
GLUCOSE-CAPILLARY: 109 mg/dL — AB (ref 70–99)
Glucose-Capillary: 74 mg/dL (ref 70–99)
Glucose-Capillary: 70 mg/dL (ref 70–99)
Glucose-Capillary: 62 mg/dL — ABNORMAL LOW (ref 70–99)
Glucose-Capillary: 92 mg/dL (ref 70–99)

## 2018-06-20 MED ORDER — METOPROLOL TARTRATE 5 MG/5ML IV SOLN: 5.0000 mg | Freq: Four times a day (QID) | INTRAVENOUS | Status: DC | PRN

## 2018-06-20 MED ORDER — SENNA 8.6 MG PO TABS
1.0000 | ORAL_TABLET | Freq: Two times a day (BID) | ORAL | Status: DC
  Administered 2018-06-20 – 2018-06-25 (×11): 8.6 mg via ORAL
  Filled 2018-06-20 (×11): qty 1

## 2018-06-20 MED ORDER — HYDRALAZINE HCL 20 MG/ML IJ SOLN: 10.0000 mg | Freq: Four times a day (QID) | INTRAMUSCULAR | Status: DC | PRN

## 2018-06-20 MED ORDER — ACETAMINOPHEN 325 MG PO TABS
650.0000 mg | ORAL_TABLET | Freq: Four times a day (QID) | ORAL | Status: DC | PRN
  Administered 2018-06-22 – 2018-06-23 (×2): 650 mg via ORAL
  Filled 2018-06-20 (×3): qty 2

## 2018-06-20 NOTE — Progress Notes (Signed)
4 Days Post-Op  Subjective: Stable.  Alert.  Pleasant. She states that she wants to go home today, but has not passed any flatus and has not had a bowel movement Otherwise looks well. I told her that the safest course was to discharge once bowel function normalizes a bit  Objective: Vital signs in last 24 hours: Temp:  [98.2 F (36.8 C)-98.6 F (37 C)] 98.2 F (36.8 C) (03/21 0536) Pulse Rate:  [65-69] 67 (03/21 0536) Resp:  [16-18] 16 (03/21 0536) BP: (114-131)/(54-71) 131/71 (03/21 0536) SpO2:  [98 %-100 %] 98 % (03/21 0536) Last BM Date: 06/14/18  Intake/Output from previous day: 03/20 0701 - 03/21 0700 In: 2300.9 [P.O.:1020; I.V.:1280.9] Out: 1225 [Urine:1225] Intake/Output this shift: No intake/output data recorded.  General appearance: Alert.  Cooperative.  No distress.  Mental status normal Resp: clear to auscultation bilaterally GI: Obese.  Soft.  Nontender.  Nondistended.  Hypoactive bowel sounds.  Lab Results:  Results for orders placed or performed during the hospital encounter of 06/16/18 (from the past 24 hour(s))  Glucose, capillary     Status: None   Collection Time: 06/19/18  7:31 AM  Result Value Ref Range   Glucose-Capillary 98 70 - 99 mg/dL  Glucose, capillary     Status: Abnormal   Collection Time: 06/19/18 11:58 AM  Result Value Ref Range   Glucose-Capillary 57 (L) 70 - 99 mg/dL  Glucose, capillary     Status: Abnormal   Collection Time: 06/19/18 12:48 PM  Result Value Ref Range   Glucose-Capillary 113 (H) 70 - 99 mg/dL  Glucose, capillary     Status: None   Collection Time: 06/19/18  5:09 PM  Result Value Ref Range   Glucose-Capillary 79 70 - 99 mg/dL  Glucose, capillary     Status: None   Collection Time: 06/19/18  9:39 PM  Result Value Ref Range   Glucose-Capillary 86 70 - 99 mg/dL     Studies/Results: No results found.  Marland Kitchen acetaminophen  1,000 mg Oral Q6H  . carvedilol  12.5 mg Oral BID WC  . DULoxetine  30 mg Oral Daily  .  enoxaparin (LOVENOX) injection  40 mg Subcutaneous Q24H  . famotidine  20 mg Oral BID  . insulin aspart  0-9 Units Subcutaneous TID WC  . irbesartan  150 mg Oral Daily  . lamoTRIgine  200 mg Oral BID  . pantoprazole  40 mg Oral Daily  . polyethylene glycol  17 g Oral Daily  . senna  1 tablet Oral BID     Assessment/Plan: s/p Procedure(s): UPPER GI ENDOSCOPY LAPAROSCOPY DIAGNOSTIC   POD #4.  SBO with adhesive band obstruction proximal to the jejunojejunostomy with dilated lacteals.  Status post upper endoscopy, laparoscopy with enteral lysis of obstructing band, Dr. Hassell Done, June 16, 2018  Status post Roux-en-Y gastric bypass  Soft diet Added Senokot to bowel regimen Home when GI function normalizes Discontinue narcotics Acetaminophen for pain This may be tomorrow or as early as this afternoon Follow-up with Dr. Hassell Done once discharged. Discharge instructions are in the AVS.  @PROBHOSP @  LOS: 3 days    Denise Davis 06/20/2018  . .prob

## 2018-06-20 NOTE — Progress Notes (Signed)
Patient refused her miralax, eventhough nurse educated her of complications from constipation. Patient stated "I don't want that staff". 06/20/18--- C. Orient RN-BSN

## 2018-06-20 NOTE — Plan of Care (Signed)
  Problem: Education: Goal: Knowledge of General Education information will improve Description: Including pain rating scale, medication(s)/side effects and non-pharmacologic comfort measures Outcome: Progressing   Problem: Health Behavior/Discharge Planning: Goal: Ability to manage health-related needs will improve Outcome: Progressing   Problem: Clinical Measurements: Goal: Ability to maintain clinical measurements within normal limits will improve Outcome: Progressing Goal: Will remain free from infection Outcome: Progressing Goal: Respiratory complications will improve Outcome: Progressing   Problem: Activity: Goal: Risk for activity intolerance will decrease Outcome: Progressing   Problem: Nutrition: Goal: Adequate nutrition will be maintained Outcome: Progressing   Problem: Coping: Goal: Level of anxiety will decrease Outcome: Progressing   Problem: Elimination: Goal: Will not experience complications related to bowel motility Outcome: Progressing Goal: Will not experience complications related to urinary retention Outcome: Progressing   Problem: Pain Managment: Goal: General experience of comfort will improve Outcome: Progressing   Problem: Safety: Goal: Ability to remain free from injury will improve Outcome: Progressing   Problem: Skin Integrity: Goal: Risk for impaired skin integrity will decrease Outcome: Progressing   

## 2018-06-21 LAB — GLUCOSE, CAPILLARY
Glucose-Capillary: 78 mg/dL (ref 70–99)
Glucose-Capillary: 62 mg/dL — ABNORMAL LOW (ref 70–99)
Glucose-Capillary: 86 mg/dL (ref 70–99)
Glucose-Capillary: 116 mg/dL — ABNORMAL HIGH (ref 70–99)
Glucose-Capillary: 76 mg/dL (ref 70–99)

## 2018-06-21 NOTE — Progress Notes (Signed)
Hypoglycemic Event  CBG: 62  Treatment: given 4oz of cranberry juice per hypoglycemic protocol Symptoms: NONE  Follow-up CBG: Time:2240 CBG Result92  Possible Reasons for Event: pt has had low oral intake  Comments/MD notified:Pt in stable condition and increased oral intake after discussion. Pt denied any needs and showed no signs or symptoms of hypoglycemia    Julio Alm

## 2018-06-21 NOTE — Progress Notes (Signed)
[facility]:  [phone] General Surgery Progress Note   LOS: 4 days  POD -  5 Days Post-Op  Chief Complaint: Abdominal pain  Assessment and Plan: 1.  UPPER GI ENDOSCOPY, LAPAROSCOPY DIAGNOSTIC with enterolysis - 06/16/2018 - Hassell Done  Still nauseated with liquids  No ready to go home  2.  History of gastric bypass  [facility] - 2017 - she has lost 200 pounds 3.  HTN 4.  History of seizures 5.  History of DVT 6.  History of RCC with left nephrectomy 7.  DVT prophylaxis - Lovenox   Active Problems:   Acute abdominal pain  Subjective:  Still nauseated, but has not vomited  Walking some.  Not ready to go home.  Objective:   Vitals:   06/21/18 0459 06/21/18 0906  BP: 115/67 125/60  Pulse: 61 77  Resp: 16   Temp: 98.2 F (36.8 C)   SpO2: 100%      Intake/Output from previous day:  03/21 0701 - 03/22 0700 In: 2195.3 [P.O.:1080; I.V.:1115.3] Out: 600 [Urine:600]  Intake/Output this shift:  Total I/O In: 120 [P.O.:120] Out: -    Physical Exam:   General: Obese AA F who is alert and oriented.    HEENT: Normal. Pupils equal. .   Lungs: Clear   Abdomen: Soft.  Has BS.   Wound: Clean   Lab Results:    Recent Labs    06/18/18 0938  WBC 2.5*  HGB 12.2  HCT 38.0  PLT 159    BMET   Recent Labs    06/18/18 0938  NA 137  K 4.4  CL 106  CO2 26  GLUCOSE 82  BUN 9  CREATININE 0.84  CALCIUM 8.6*    PT/INR  No results for input(s): LABPROT, INR in the last 72 hours.  ABG  No results for input(s): PHART, HCO3 in the last 72 hours.  Invalid input(s): PCO2, PO2   Studies/Results:  No results found.   Anti-infectives:   Anti-infectives (From admission, onward)   Start     Dose/Rate Route Frequency Ordered Stop   06/16/18 1015  ceFAZolin (ANCEF) IVPB 2g/100 mL premix     2 g 200 mL/hr over 30 Minutes Intravenous On call to O.R. 06/16/18 1000 06/16/18 1051   06/16/18 1003  ceFAZolin (ANCEF) 2-4 GM/100ML-% IVPB    Note to Pharmacy:   Georgena Spurling   : cabinet override      06/16/18 1003 06/16/18 1051      Alphonsa Overall, MD, FACS Pager: [facility] Office: [phone] 06/21/2018

## 2018-06-21 NOTE — Progress Notes (Signed)
BG 62. Patient given apple juice and encouraged to eat. Will re-check BG in one hour and continue to monitor.

## 2018-06-22 ENCOUNTER — Inpatient Hospital Stay (HOSPITAL_COMMUNITY): Payer: PPO

## 2018-06-22 LAB — COMPREHENSIVE METABOLIC PANEL
ALT: 25 U/L (ref 0–44)
AST: 27 U/L (ref 15–41)
Albumin: 3.7 g/dL (ref 3.5–5.0)
Alkaline Phosphatase: 82 U/L (ref 38–126)
Anion gap: 10 (ref 5–15)
BUN: 10 mg/dL (ref 6–20)
CO2: 26 mmol/L (ref 22–32)
Calcium: 9 mg/dL (ref 8.9–10.3)
Chloride: 102 mmol/L (ref 98–111)
Creatinine, Ser: 0.92 mg/dL (ref 0.44–1.00)
GFR calc Af Amer: >60 mL/min (ref >60)
GFR calc non Af Amer: >60 mL/min (ref >60)
Glucose, Bld: 141 mg/dL — ABNORMAL HIGH (ref 70–99)
Potassium: 4.1 mmol/L (ref 3.5–5.1)
Sodium: 138 mmol/L (ref 135–145)
Total Bilirubin: 0.6 mg/dL (ref 0.3–1.2)
Total Protein: 6.7 g/dL (ref 6.5–8.1)

## 2018-06-22 LAB — GLUCOSE, CAPILLARY
Glucose-Capillary: 87 mg/dL (ref 70–99)
Glucose-Capillary: 86 mg/dL (ref 70–99)
Glucose-Capillary: 129 mg/dL — ABNORMAL HIGH (ref 70–99)
Glucose-Capillary: 66 mg/dL — ABNORMAL LOW (ref 70–99)
Glucose-Capillary: 95 mg/dL (ref 70–99)
Glucose-Capillary: 123 mg/dL — ABNORMAL HIGH (ref 70–99)

## 2018-06-22 LAB — CBC
HEMATOCRIT: 44.5 % (ref 36.0–46.0)
Hemoglobin: 13.9 g/dL (ref 12.0–15.0)
MCH: 31 pg (ref 26.0–34.0)
MCHC: 31.2 g/dL (ref 30.0–36.0)
MCV: 99.1 fL (ref 80.0–100.0)
Platelets: 195 10*3/uL (ref 150–400)
RBC: 4.49 MIL/uL (ref 3.87–5.11)
RDW: 12.9 % (ref 11.5–15.5)
WBC: 2.7 10*3/uL — ABNORMAL LOW (ref 4.0–10.5)
nRBC: 0 % (ref 0.0–0.2)

## 2018-06-22 MED ORDER — HYDROCODONE-ACETAMINOPHEN 5-325 MG PO TABS
1.0000 | ORAL_TABLET | ORAL | Status: DC | PRN
  Administered 2018-06-22 – 2018-06-25 (×11): 2 via ORAL
  Filled 2018-06-22 (×11): qty 2

## 2018-06-22 MED ORDER — IOHEXOL 300 MG/ML  SOLN
15.0000 mL | Freq: Once | INTRAMUSCULAR | Status: DC | PRN
  Administered 2018-06-22: 15 mL via ORAL
  Filled 2018-06-22: qty 20

## 2018-06-22 MED ORDER — HYDROMORPHONE HCL 1 MG/ML IJ SOLN
1.0000 mg | INTRAMUSCULAR | Status: DC | PRN
  Administered 2018-06-22: 1 mg via INTRAVENOUS
  Filled 2018-06-22: qty 1

## 2018-06-22 MED ORDER — SODIUM CHLORIDE (PF) 0.9 % IJ SOLN
INTRAMUSCULAR | Status: AC
  Administered 2018-06-22: 17:00:00
  Filled 2018-06-22: qty 50

## 2018-06-22 MED ORDER — IOHEXOL 300 MG/ML  SOLN
100.0000 mL | Freq: Once | INTRAMUSCULAR | Status: AC | PRN
  Administered 2018-06-22: 100 mL via INTRAVENOUS

## 2018-06-22 NOTE — Progress Notes (Addendum)
6 Days Post-Op    CC: Abdominal pain  Subjective: She looks fine.  Her abdomen is soft she says is tender, but there is no distention positive bowel sounds.  She denies flatus or BM.  Also noted her glucoses are dropping.  She has a large tray in front of her for breakfast but says she is only taking a portion of it.  Objective: Vital signs in last 24 hours: Temp:  [98.5 F (36.9 C)-98.9 F (37.2 C)] 98.9 F (37.2 C) (03/22 2157) Pulse Rate:  [62-77] 64 (03/22 2157) Resp:  [16] 16 (03/22 2157) BP: (125-157)/(60-97) 137/97 (03/22 2157) SpO2:  [98 %-100 %] 100 % (03/22 2157) Last BM Date: 06/14/18 120 p.o. recorded 1199 IV No output recorded Afebrile vital signs are stable No labs Intake/Output from previous day: 03/22 0701 - 03/23 0700 In: 1319.9 [P.O.:120; I.V.:1199.9] Out: -  Intake/Output this shift: No intake/output data recorded.  General appearance: alert, cooperative and no distress Resp: clear to auscultation bilaterally GI: Soft, sore, few bowel sounds, port sites all look good.  She denies any flatus or bowel movement.  Lab Results:  No results for input(s): WBC, HGB, HCT, PLT in the last 72 hours.  BMET No results for input(s): NA, K, CL, CO2, GLUCOSE, BUN, CREATININE, CALCIUM in the last 72 hours. PT/INR No results for input(s): LABPROT, INR in the last 72 hours.  Recent Labs  Lab 06/16/18 0500  AST 26  ALT 30  ALKPHOS 83  BILITOT 0.8  PROT 7.3  ALBUMIN 3.8     Lipase     Component Value Date/Time   LIPASE 47 06/16/2018 0500   LIPASE 180 04/18/2014 1853     Medications: . acetaminophen  1,000 mg Oral Q6H  . carvedilol  12.5 mg Oral BID WC  . DULoxetine  30 mg Oral Daily  . enoxaparin (LOVENOX) injection  40 mg Subcutaneous Q24H  . famotidine  20 mg Oral BID  . insulin aspart  0-9 Units Subcutaneous TID WC  . irbesartan  150 mg Oral Daily  . lamoTRIgine  200 mg Oral BID  . pantoprazole  40 mg Oral Daily  . polyethylene glycol  17 g Oral  Daily  . senna  1 tablet Oral BID    Assessment/Plan Hypertension Hx seizure disorder Hx DVT Hx RCC with left nephrectomy Complains of chest pain/Hx GERD   Acute abdominal pain, s/p roux-en-Y gastric bypass Band obstruction proximal to jejunojejunostomy with dilated lacteals Upper endoscopy, laparoscopy with enterolysis of obstructing band, 06/16/18, Dr. Hassell Done POD #6  FEN: IV fluids/full liquids =>> soft diet this a.m. ID: Ancef preop DVT: Lovenox Follow up: Dr. Hassell Done   Plan: I can update her labs now.  We will get a CT with IV and oral contrast this a.m.    LOS: 5 days    Babette Stum 06/22/2018 [phone]     LOS: 5 days    Ezariah Nace 06/22/2018 [phone]

## 2018-06-22 NOTE — Care Management Important Message (Signed)
Important Message  Patient Details  Name: Denise Davis MRN: [id number] Date of Birth: 1966/02/18   Medicare Important Message Given:  Yes    Kerin Salen 06/22/2018, 11:13 AMImportant Message  Patient Details  Name: Denise Davis MRN: [id number] Date of Birth: 02-03-1966   Medicare Important Message Given:  Yes    Kerin Salen 06/22/2018, 11:13 AM

## 2018-06-23 LAB — GLUCOSE, CAPILLARY
GLUCOSE-CAPILLARY: 86 mg/dL (ref 70–99)
Glucose-Capillary: 90 mg/dL (ref 70–99)
Glucose-Capillary: 85 mg/dL (ref 70–99)
Glucose-Capillary: 186 mg/dL — ABNORMAL HIGH (ref 70–99)

## 2018-06-23 LAB — CREATININE, SERUM
Creatinine, Ser: 0.88 mg/dL (ref 0.44–1.00)
GFR calc Af Amer: >60 mL/min (ref >60)
GFR calc non Af Amer: >60 mL/min (ref >60)

## 2018-06-23 MED ORDER — MINERAL OIL RE ENEM
1.0000 | ENEMA | Freq: Once | RECTAL | Status: AC
  Administered 2018-06-23: 1 via RECTAL
  Filled 2018-06-23: qty 1

## 2018-06-23 MED ORDER — BISACODYL 10 MG RE SUPP
10.0000 mg | Freq: Once | RECTAL | Status: AC
  Administered 2018-06-23: 10 mg via RECTAL
  Filled 2018-06-23: qty 1

## 2018-06-23 NOTE — Progress Notes (Addendum)
7 Days Post-Op    CC: Abdominal pain  Subjective: Still complaining of abdominal pain this a.m.  Pointing to the midsection.  She is taking some p.o.'s but says she is having nausea/some dry heaves.  No actual vomiting.  She denies any flatus or BM.  Last BM recorded 06/14/18.  Objective: Vital signs in last 24 hours: Temp:  [98.4 F (36.9 C)-99.1 F (37.3 C)] 99.1 F (37.3 C) (03/24 0649) Pulse Rate:  [61-82] 73 (03/24 0649) Resp:  [16-18] 18 (03/24 0649) BP: (122-153)/(69-98) 153/98 (03/24 0649) SpO2:  [100 %] 100 % (03/24 0649) Last BM Date: 06/14/18 1080 PO 1184 iv VOIDED  X 8 No BM recorded Afebrile, VSS BP up in PM Labs OK yesterday CT scan3/23:  No acute findings Intake/Output from previous day: 03/23 0701 - 03/24 0700 In: 2264.8 [P.O.:1080; I.V.:1184.8] Out: 0  Intake/Output this shift: Total I/O In: 120 [P.O.:120] Out: -   General appearance: alert, cooperative and no distress Resp: clear to auscultation bilaterally GI: Soft, sites all look fine.  No distention.  She does have bowel sounds.  Points to pain mid abdomen.  No BM or flatus reported.  Lab Results:  Recent Labs    06/22/18 0802  WBC 2.7*  HGB 13.9  HCT 44.5  PLT 195    BMET Recent Labs    06/22/18 0802 06/23/18 0514  NA 138  --   K 4.1  --   CL 102  --   CO2 26  --   GLUCOSE 141*  --   BUN 10  --   CREATININE 0.92 0.88  CALCIUM 9.0  --    PT/INR No results for input(s): LABPROT, INR in the last 72 hours.  Recent Labs  Lab 06/22/18 0802  AST 27  ALT 25  ALKPHOS 82  BILITOT 0.6  PROT 6.7  ALBUMIN 3.7     Lipase     Component Value Date/Time   LIPASE 47 06/16/2018 0500   LIPASE 180 04/18/2014 1853     Medications: . carvedilol  12.5 mg Oral BID WC  . DULoxetine  30 mg Oral Daily  . enoxaparin (LOVENOX) injection  40 mg Subcutaneous Q24H  . famotidine  20 mg Oral BID  . insulin aspart  0-9 Units Subcutaneous TID WC  . irbesartan  150 mg Oral Daily  . lamoTRIgine   200 mg Oral BID  . pantoprazole  40 mg Oral Daily  . polyethylene glycol  17 g Oral Daily  . senna  1 tablet Oral BID    Assessment/Plan  Hypertension Hx seizure disorder Hx DVT Hx RCC with left nephrectomy Complains of chest pain/Hx GERD   Acute abdominal pain, s/p roux-en-Y gastric bypass Band obstruction proximal to jejunojejunostomy with dilated lacteals Upper endoscopy, laparoscopy with enterolysis of obstructing band, 06/16/18, Dr. Hassell Done POD #7  FEN:IV fluids/full liquids=>>soft diet  ID: Ancef preop DVT: Lovenox Follow up: Dr. Hassell Done  Plan: I gave her Dulcolax suppository this a.m. I discontinued her Dilaudid.  Continue to mobilize.  If the Dulcolax does not work we will consider an enema.  she had about 25% of her breakfast tray, complaining of some dry heaves, and asking for more nausea medicine.  No results with the dulcolax suppository, will try Mineral oil enema.     LOS: 6 days    Denise Davis 06/23/2018 [phone]

## 2018-06-24 ENCOUNTER — Inpatient Hospital Stay (HOSPITAL_COMMUNITY): Payer: PPO

## 2018-06-24 LAB — GLUCOSE, CAPILLARY
GLUCOSE-CAPILLARY: 78 mg/dL (ref 70–99)
GLUCOSE-CAPILLARY: 120 mg/dL — AB (ref 70–99)
Glucose-Capillary: 81 mg/dL (ref 70–99)
Glucose-Capillary: 93 mg/dL (ref 70–99)

## 2018-06-24 LAB — COMPREHENSIVE METABOLIC PANEL
ALT: 34 U/L (ref 0–44)
AST: 35 U/L (ref 15–41)
Albumin: 3.3 g/dL — ABNORMAL LOW (ref 3.5–5.0)
Alkaline Phosphatase: 74 U/L (ref 38–126)
Anion gap: 7 (ref 5–15)
BUN: 11 mg/dL (ref 6–20)
CO2: 27 mmol/L (ref 22–32)
Calcium: 8.8 mg/dL — ABNORMAL LOW (ref 8.9–10.3)
Chloride: 105 mmol/L (ref 98–111)
Creatinine, Ser: 0.9 mg/dL (ref 0.44–1.00)
GFR calc Af Amer: >60 mL/min (ref >60)
GFR calc non Af Amer: >60 mL/min (ref >60)
GLUCOSE: 84 mg/dL (ref 70–99)
Potassium: 4.3 mmol/L (ref 3.5–5.1)
SODIUM: 139 mmol/L (ref 135–145)
Total Bilirubin: 0.5 mg/dL (ref 0.3–1.2)
Total Protein: 6.1 g/dL — ABNORMAL LOW (ref 6.5–8.1)

## 2018-06-24 LAB — CBC
HCT: 39.9 % (ref 36.0–46.0)
Hemoglobin: 12.8 g/dL (ref 12.0–15.0)
MCH: 31.3 pg (ref 26.0–34.0)
MCHC: 32.1 g/dL (ref 30.0–36.0)
MCV: 97.6 fL (ref 80.0–100.0)
NRBC: 0 % (ref 0.0–0.2)
Platelets: 202 10*3/uL (ref 150–400)
RBC: 4.09 MIL/uL (ref 3.87–5.11)
RDW: 13.1 % (ref 11.5–15.5)
WBC: 3.3 10*3/uL — ABNORMAL LOW (ref 4.0–10.5)

## 2018-06-24 MED ORDER — MAGNESIUM CITRATE PO SOLN
1.0000 | Freq: Once | ORAL | Status: AC
  Administered 2018-06-24: 1 via ORAL
  Filled 2018-06-24: qty 296

## 2018-06-24 NOTE — Progress Notes (Signed)
8 Days Post-Op    CC: Abdominal pain  Subjective: Still complaining of pain, no BM so far she has a large tray in front of her. Took plain Tylenol once.  He took Norco 5/325 -10 tablets yesterday Objective: Vital signs in last 24 hours: Temp:  [98.4 F (36.9 C)-99.2 F (37.3 C)] 99 F (37.2 C) (03/25 0441) Pulse Rate:  [64-75] 64 (03/25 0441) Resp:  [14-18] 14 (03/25 0441) BP: (126-151)/(61-91) 139/67 (03/25 0441) SpO2:  [100 %] 100 % (03/25 0441) Last BM Date: 06/14/18 1130 p.o. Voided x3 No BM recorded -she reported no results with Dulcolax or mineral oil enema. Afebrile vital signs are stable Labs this a.m. are essentially normal.   Single view film obtained this a.m. shows formed stool throughout the colon with gas seen along the right peritoneal fat stripe stable no valvular changes no changes in the gastric bypass and cholecystectomy. Intake/Output from previous day: 03/24 0701 - 03/25 0700 In: 1130 [P.O.:1130] Out: -  Intake/Output this shift: No intake/output data recorded.  General appearance: alert, cooperative and no distress Resp: clear to auscultation bilaterally GI: Soft, she is not tender, she does have bowel sounds.  Reports no flatus or BM.  Lab Results:  Recent Labs    06/22/18 0802 06/24/18 0325  WBC 2.7* 3.3*  HGB 13.9 12.8  HCT 44.5 39.9  PLT 195 202    BMET Recent Labs    06/22/18 0802 06/23/18 0514 06/24/18 0325  NA 138  --  139  K 4.1  --  4.3  CL 102  --  105  CO2 26  --  27  GLUCOSE 141*  --  84  BUN 10  --  11  CREATININE 0.92 0.88 0.90  CALCIUM 9.0  --  8.8*   PT/INR No results for input(s): LABPROT, INR in the last 72 hours.  Recent Labs  Lab 06/22/18 0802 06/24/18 0325  AST 27 35  ALT 25 34  ALKPHOS 82 74  BILITOT 0.6 0.5  PROT 6.7 6.1*  ALBUMIN 3.7 3.3*     Lipase     Component Value Date/Time   LIPASE 47 06/16/2018 0500   LIPASE 180 04/18/2014 1853     Medications: . carvedilol  12.5 mg Oral BID WC   . DULoxetine  30 mg Oral Daily  . enoxaparin (LOVENOX) injection  40 mg Subcutaneous Q24H  . famotidine  20 mg Oral BID  . insulin aspart  0-9 Units Subcutaneous TID WC  . irbesartan  150 mg Oral Daily  . lamoTRIgine  200 mg Oral BID  . pantoprazole  40 mg Oral Daily  . polyethylene glycol  17 g Oral Daily  . senna  1 tablet Oral BID    Assessment/Plan Hypertension Hx seizure disorder Hx DVT Hx RCC with left nephrectomy Complains of chest pain/Hx GERD Postop ileus/constipation   Acute abdominal pain, s/p roux-en-Y gastric bypass Band obstruction proximal to jejunojejunostomy with dilated lacteals Upper endoscopy, laparoscopy with enterolysis of obstructing band, 06/16/18, Dr. Hassell Done POD #7  FEN:IV fluids/full liquids=>>soft diet  ID: Ancef preop DVT: Lovenox Follow up: Dr. Hassell Done   Plan: A.m. film reviewed and we plan to give her a bottle of mag citrate today.  LOS: 7 days    Denise Davis 06/24/2018 [phone]

## 2018-06-25 LAB — GLUCOSE, CAPILLARY: Glucose-Capillary: 85 mg/dL (ref 70–99)

## 2018-06-25 MED ORDER — HYDROCODONE-ACETAMINOPHEN 5-325 MG PO TABS: 1.0000 | ORAL_TABLET | Freq: Four times a day (QID) | ORAL | 0 refills | Status: DC | PRN

## 2018-06-25 MED ORDER — LAMOTRIGINE ER 200 MG PO TB24: 400.0000 mg | ORAL_TABLET | Freq: Every day | ORAL | Status: DC

## 2018-06-25 MED ORDER — HYDROCODONE-ACETAMINOPHEN 5-325 MG PO TABS: 1.0000 | ORAL_TABLET | Freq: Four times a day (QID) | ORAL | 0 refills | Status: AC | PRN

## 2018-06-25 MED ORDER — ACETAMINOPHEN 325 MG PO TABS: ORAL_TABLET | ORAL | Status: AC

## 2018-06-25 MED ORDER — DULOXETINE HCL 30 MG PO CPEP: 30.0000 mg | ORAL_CAPSULE | Freq: Every day | ORAL | Status: DC

## 2018-06-25 MED ORDER — POLYETHYLENE GLYCOL 3350 17 G PO PACK: PACK | ORAL | 0 refills | Status: AC

## 2018-06-25 NOTE — Progress Notes (Signed)
Writer explained discharge instructions to patient. Patient had no questions. Norco script given to patient. NT wheeled patient to the front of the building

## 2018-06-25 NOTE — Discharge Instructions (Signed)
[facility] ______[facility] °LAPAROSCOPIC SURGERY: POST OP INSTRUCTIONS °Always review your discharge instruction sheet given to you by the facility where your surgery was performed. °IF YOU HAVE DISABILITY OR FAMILY LEAVE FORMS, YOU MUST BRING THEM TO THE OFFICE FOR PROCESSING.   °DO NOT GIVE THEM TO YOUR DOCTOR. ° °1. A prescription for pain medication may be given to you upon discharge.  Take your pain medication as prescribed, if needed.  If narcotic pain medicine is not needed, then you may take acetaminophen (Tylenol) or ibuprofen (Advil) as needed. °2. Take your usually prescribed medications unless otherwise directed. °3. If you need a refill on your pain medication, please contact your pharmacy.  They will contact our office to request authorization. Prescriptions will not be filled after 5pm or on week-ends. °4. You should follow a light diet the first few days after arrival home, such as soup and crackers, etc.  Be sure to include lots of fluids daily. °5. Most patients will experience some swelling and bruising in the area of the incisions.  Ice packs will help.  Swelling and bruising can take several days to resolve.  °6. It is common to experience some constipation if taking pain medication after surgery.  Increasing fluid intake and taking a stool softener (such as Colace) will usually help or prevent this problem from occurring.  A mild laxative (Milk of Magnesia or Miralax) should be taken according to package instructions if there are no bowel movements after 48 hours. °7. Unless discharge instructions indicate otherwise, you may remove your bandages 24-48 hours after surgery, and you may shower at that time.  You may have steri-strips (small skin tapes) in place directly over the incision.  These strips should be left on the skin for 7-10 days.  If your surgeon used skin glue on the incision, you may shower in 24 hours.  The glue will flake off over the next 2-3 weeks.  Any sutures or  staples will be removed at the office during your follow-up visit. °8. ACTIVITIES:  You may resume regular (light) daily activities beginning the next day--such as daily self-care, walking, climbing stairs--gradually increasing activities as tolerated.  You may have sexual intercourse when it is comfortable.  Refrain from any heavy lifting or straining until approved by your doctor. °a. You may drive when you are no longer taking prescription pain medication, you can comfortably wear a seatbelt, and you can safely maneuver your car and apply brakes. °b. RETURN TO WORK:  __________________________________________________________ °9. You should see your doctor in the office for a follow-up appointment approximately 2-3 weeks after your surgery.  Make sure that you call for this appointment within a day or two after you arrive home to insure a convenient appointment time. °10. OTHER INSTRUCTIONS: __________________________________________________________________________________________________________________________ __________________________________________________________________________________________________________________________ °WHEN TO CALL YOUR DOCTOR: °1. Fever over 101.0 °2. Inability to urinate °3. Continued bleeding from incision. °4. Increased pain, redness, or drainage from the incision. °5. Increasing abdominal pain ° °The clinic staff is available to answer your questions during regular business hours.  Please don’t hesitate to call and ask to speak to one of the nurses for clinical concerns.  If you have a medical emergency, go to the nearest emergency room or call 911.  A surgeon from [facility] is always on call at the hospital. °[address], [address], [place], [place]  [address] ? [address] [address], [place],    [address] °[phone] ? [phone] ? FAX [phone] °Web site:   [email or web address]  GETTING TO GOOD BOWEL HEALTH. Irregular bowel habits such  as constipation and diarrhea can lead to many problems over time.  Having one soft bowel movement a day is the most important way to prevent further problems.  The anorectal canal is designed to handle stretching and feces to safely manage our ability to get rid of solid waste (feces, poop, stool) out of our body.  BUT, hard constipated stools can act like ripping concrete bricks and diarrhea can be a burning fire to this very sensitive area of our body, causing inflamed hemorrhoids, anal fissures, increasing risk is perirectal abscesses, abdominal pain/bloating, an making irritable bowel worse.     The goal: ONE SOFT BOWEL MOVEMENT A DAY!  To have soft, regular bowel movements:   Drink at least 8 tall glasses of water a day.    Take plenty of fiber.  Fiber is the undigested part of plant food that passes into the colon, acting s natures broom to encourage bowel motility and movement.  Fiber can absorb and hold large amounts of water. This results in a larger, bulkier stool, which is soft and easier to pass. Work gradually over several weeks up to 6 servings a day of fiber (25g a day even more if needed) in the form of: o Vegetables -- Root (potatoes, carrots, turnips), leafy green (lettuce, salad greens, celery, spinach), or cooked high residue (cabbage, broccoli, etc) o Fruit -- Fresh (unpeeled skin & pulp), Dried (prunes, apricots, cherries, etc ),  or stewed ( applesauce)  o Whole grain breads, pasta, etc (whole wheat)  o Bran cereals   Bulking Agents -- This type of water-retaining fiber generally is easily obtained each day by one of the following:  o Psyllium bran -- The psyllium plant is remarkable because its ground seeds can retain so much water. This product is available as Metamucil, Konsyl, Effersyllium, Per Diem Fiber, or the less expensive generic preparation in drug and health food stores. Although labeled a laxative, it really is not a laxative.  o Methylcellulose -- This is another  fiber derived from wood which also retains water. It is available as Citrucel. o Polyethylene Glycol - and artificial fiber commonly called Miralax or Glycolax.  It is helpful for people with gassy or bloated feelings with regular fiber o Flax Seed - a less gassy fiber than psyllium  No reading or other relaxing activity while on the toilet. If bowel movements take longer than 5 minutes, you are too constipated  AVOID CONSTIPATION.  High fiber and water intake usually takes care of this.  Sometimes a laxative is needed to stimulate more frequent bowel movements, but   Laxatives are not a good long-term solution as it can wear the colon out. o Osmotics (Milk of Magnesia, Fleets phosphosoda, Magnesium citrate, MiraLax, GoLytely) are safer than  o Stimulants (Senokot, Castor Oil, Dulcolax, Ex Lax)    o Do not take laxatives for more than 7days in a row.   IF SEVERELY CONSTIPATED, try a Bowel Retraining Program: o Do not use laxatives.  o Eat a diet high in roughage, such as bran cereals and leafy vegetables.  o Drink six (6) ounces of prune or apricot juice each morning.  o Eat two (2) large servings of stewed fruit each day.  o Take one (1) heaping tablespoon of a psyllium-based bulking agent twice a day. Use sugar-free sweetener when possible to avoid excessive calories.  o Eat a normal breakfast.  o Set aside 15 minutes  after breakfast to sit on the toilet, but do not strain to have a bowel movement.  o If you do not have a bowel movement by the third day, use an enema and repeat the above steps.   Controlling diarrhea o Switch to liquids and simpler foods for a few days to avoid stressing your intestines further. o Avoid dairy products (especially milk & ice cream) for a short time.  The intestines often can lose the ability to digest lactose when stressed. o Avoid foods that cause gassiness or bloating.  Typical foods include beans and other legumes, cabbage, broccoli, and dairy foods.   Every person has some sensitivity to other foods, so listen to our body and avoid those foods that trigger problems for you. o Adding fiber (Citrucel, Metamucil, psyllium, Miralax) gradually can help thicken stools by absorbing excess fluid and retrain the intestines to act more normally.  Slowly increase the dose over a few weeks.  Too much fiber too soon can backfire and cause cramping & bloating. o Probiotics (such as active yogurt, Align, etc) may help repopulate the intestines and colon with normal bacteria and calm down a sensitive digestive tract.  Most studies show it to be of mild help, though, and such products can be costly. o Medicines: - Bismuth subsalicylate (ex. Kayopectate, Pepto Bismol) every 30 minutes for up to 6 doses can help control diarrhea.  Avoid if pregnant. - Loperamide (Immodium) can slow down diarrhea.  Start with two tablets (4mg  total) first and then try one tablet every 6 hours.  Avoid if you are having fevers or severe pain.  If you are not better or start feeling worse, stop all medicines and call your doctor for advice o Call your doctor if you are getting worse or not better.  Sometimes further testing (cultures, endoscopy, X-ray studies, bloodwork, etc) may be needed to help diagnose and treat the cause of the diarrhea.

## 2018-06-25 NOTE — Progress Notes (Signed)
9 Days Post-Op   Chief complaint: Abdominal pain  Subjective: Patient did have a BM.  She said it was not as much as she expected.  She is feeling better tolerating diet fairly well.  She says she is ready for discharge.  Objective: Vital signs in last 24 hours: Temp:  [98.1 F (36.7 C)-98.6 F (37 C)] 98.1 F (36.7 C) (03/26 0540) Pulse Rate:  [58-69] 63 (03/26 0540) Resp:  [14-16] 16 (03/26 0540) BP: (114-142)/(60-77) 119/66 (03/26 0540) SpO2:  [99 %-100 %] 100 % (03/26 0540) Last BM Date: 06/24/18(pt said very small) 1300 p.o. recorded Voided x4 BM x1/4 Afebrile vital signs are stable Labs today. Intake/Output from previous day: 03/25 0701 - 03/26 0700 In: 1300 [P.O.:1300] Out: -  Intake/Output this shift: No intake/output data recorded.  General appearance: alert, cooperative and no distress Resp: clear to auscultation bilaterally GI: Soft, normal postop soreness.  Tolerating soft diet, positive BM.  Port sites all look good and are healing nicely.  Lab Results:  Recent Labs    06/24/18 0325  WBC 3.3*  HGB 12.8  HCT 39.9  PLT 202    BMET Recent Labs    06/23/18 0514 06/24/18 0325  NA  --  139  K  --  4.3  CL  --  105  CO2  --  27  GLUCOSE  --  84  BUN  --  11  CREATININE 0.88 0.90  CALCIUM  --  8.8*   PT/INR No results for input(s): LABPROT, INR in the last 72 hours.  Recent Labs  Lab 06/22/18 0802 06/24/18 0325  AST 27 35  ALT 25 34  ALKPHOS 82 74  BILITOT 0.6 0.5  PROT 6.7 6.1*  ALBUMIN 3.7 3.3*     Lipase     Component Value Date/Time   LIPASE 47 06/16/2018 0500   LIPASE 180 04/18/2014 1853     Medications: . carvedilol  12.5 mg Oral BID WC  . DULoxetine  30 mg Oral Daily  . enoxaparin (LOVENOX) injection  40 mg Subcutaneous Q24H  . famotidine  20 mg Oral BID  . insulin aspart  0-9 Units Subcutaneous TID WC  . irbesartan  150 mg Oral Daily  . lamoTRIgine  200 mg Oral BID  . pantoprazole  40 mg Oral Daily  . polyethylene  glycol  17 g Oral Daily  . senna  1 tablet Oral BID    Assessment/Plan Hypertension Hx seizure disorder Hx DVT Hx RCC with left nephrectomy Complains of chest pain/Hx GERD Postop ileus/constipation   Acute abdominal pain, s/p roux-en-Y gastric bypass Band obstruction proximal to jejunojejunostomy with dilated lacteals Upper endoscopy, laparoscopy with enterolysis of obstructing band, 06/16/18, Dr. Hassell Done POD# 9  FEN:IV fluids/full liquids=>>soft diet  ID: Ancef preop DVT: Lovenox Follow up: Dr. Hassell Done   Plan: Home today will arrange follow-up with Dr. Hassell Done.  LOS: 8 days    Denise Davis 06/25/2018 [phone]

## 2018-06-25 NOTE — Discharge Summary (Signed)
Physician Discharge Summary  Patient ID: Denise Davis MRN: [id number] DOB/AGE: [birth year] 53 y.o.  Admit date: 06/16/2018 Discharge date: 06/25/2018  Admission Diagnoses:  Acute abdominal pain /P Roux-en-Y gastric bypass at [facility] 3 years ago. Hypertension Hx seizure disorder Hx of renal cell cancer s/p left nephrectomy  Discharge Diagnoses:  Severe upper abdominal pain with band obstruction of the proximal to jejunojejunostomy with dilated lacteals Postop ileus/constipation Hypertension Hx seizure disorder Hx DVT Hx renal cell carcinoma, s/p left nephrectomy  Active Problems:   Acute abdominal pain   PROCEDURES: Upper endoscopy, laparoscopy with enterolysis of obstructing band, 06/16/18, Dr. Sandi Raveling Course: This is a pleasant 53 yo black female who has a history of HTN, seizure d/o (none in over a year), h/o RCC s/p nephrectomy, H/o DVT over 10 yrs ago, who underwent Roux-en Y gastric bypass surgery around 3 years ago at [facility].  She has since lost over 200lbs.  She had difficulties with the surgery including placement of a feeding tube due to chronic nausea.  She daily takes zofran, phenergan, and dexilant to help with this.  She underwent cholecystectomy a year ago as well and tolerated that well.  She has been in her normal state of health until last night around 7 her son fixed her some fish and she ate one bite and then developed an acute onset of epigastric abdominal pain.  She has never had pain like this.  It is unrelenting.  Nothing helps her pain.  She denies NSAIDs, tobacco abuse, only drinks 1 cup of caffeinated coffee a day.  This is an intense sharp pain with some crampiness as well.  She admits to nausea, but mostly dry heaving.  She denies diarrhea.  Her last BM was 2 days ago, which is normal for her.  She presented to the [facility] where she underwent a CT scan that was negative.  Her labs are essentially normal as well with a normal CBC and lactic acid.  She is  currently writhing in the bed and complaining of horrible abdominal pain; therefore, we have been asked to see the patient for further recommendations.  Patient was seen in emergency department by Dr. Armandina Gemma.  He admitted the patient and scheduled her for surgery that a.m.  He enlisted Dr. Johnathan Hausen, 1 of our bariatric surgeons to assist, and actually do the primary surgery.  Patient was found to have a single band causing the constriction by a single adhesive band.  This was released and the obstruction will resolve.  Patient was returned to the floor and had a slow postoperative course.  She complained of prolonged pain, she had troubles with postoperative ileus, ongoing episodes of nausea and abdominal discomfort.  She was also found to be constipated.  She was treated with MiraLAX, Dulcolax suppositories, fleets mineral oil enema, and finally mag citrate.  With this her constipation was resolved.  Her port sites look good she was tolerating a soft diet, and was ready for discharge on the ninth postoperative day.  She was on a multitude of outpatient drugs.  We reviewed those with her and try to continue all the one she was on prior to admission.  I recommended she call her family practice and review all his medicines as soon as she gets home.  With only one kidney she cannot use NSAID medications.  She is discharged home on plain Tylenol, Norco, and MiraLAX.  I gave her some instructions on good bowel health also.  Our office  is working to find a follow-up appointment with Dr. Hassell Done either in person, or by phone.  Condition on discharge: Improved  CBC Latest Ref Rng & Units 06/24/2018 06/22/2018 06/18/2018  WBC 4.0 - 10.5 K/uL 3.3(L) 2.7(L) 2.5(L)  Hemoglobin 12.0 - 15.0 g/dL 12.8 13.9 12.2  Hematocrit 36.0 - 46.0 % 39.9 44.5 38.0  Platelets 150 - 400 K/uL 202 195 159   CMP Latest Ref Rng & Units 06/24/2018 06/23/2018 06/22/2018  Glucose 70 - 99 mg/dL 84 - 141(H)  BUN 6 - 20 mg/dL 11 - 10   Creatinine 0.44 - 1.00 mg/dL 0.90 0.88 0.92  Sodium 135 - 145 mmol/L 139 - 138  Potassium 3.5 - 5.1 mmol/L 4.3 - 4.1  Chloride 98 - 111 mmol/L 105 - 102  CO2 22 - 32 mmol/L 27 - 26  Calcium 8.9 - 10.3 mg/dL 8.8(L) - 9.0  Total Protein 6.5 - 8.1 g/dL 6.1(L) - 6.7  Total Bilirubin 0.3 - 1.2 mg/dL 0.5 - 0.6  Alkaline Phos 38 - 126 U/L 74 - 82  AST 15 - 41 U/L 35 - 27  ALT 0 - 44 U/L 34 - 25    Disposition: Discharge disposition: 01-Home or Self Care        Allergies as of 06/25/2018      Reactions   Lisinopril Cough      Medication List    STOP taking these medications   amoxicillin-clavulanate 875-125 MG tablet Commonly known as:  AUGMENTIN   calcium-vitamin D 250-100 MG-UNIT tablet   fexofenadine 180 MG tablet Commonly known as:  Allegra Allergy   fluticasone 50 MCG/ACT nasal spray Commonly known as:  FLONASE   montelukast 10 MG tablet Commonly known as:  SINGULAIR   ondansetron 4 MG disintegrating tablet Commonly known as:  ZOFRAN-ODT   promethazine 25 MG tablet Commonly known as:  PHENERGAN   triamcinolone cream 0.1 % Commonly known as:  KENALOG     TAKE these medications   acarbose 25 MG tablet Commonly known as:  PRECOSE Take 25 mg by mouth 3 (three) times daily with meals.   acetaminophen 325 MG tablet Commonly known as:  TYLENOL You can take 2 tablets every 4 hours as needed for pain. DO NOT TAKE MORE THAN 4000 MG OF TYLENOL PER DAY.  IT CAN HARM YOUR LIVER.  TYLENOL (ACETAMINOPHEN) IS ALSO IN YOUR PRESCRIPTION PAIN MEDICATION.  YOU HAVE TO COUNT IT IN YOUR DAILY TOTAL.   albuterol 108 (90 Base) MCG/ACT inhaler Commonly known as:  Ventolin HFA Inhale 2 puffs into the lungs every 6 (six) hours as needed for wheezing or shortness of breath.   aspirin 81 MG chewable tablet Chew 81 mg by mouth daily.   budesonide-formoterol 160-4.5 MCG/ACT inhaler Commonly known as:  Symbicort Inhale 2 puffs into the lungs 2 (two) times daily. Rinse mouth  after use   butalbital-acetaminophen-caffeine 50-325-40 MG tablet Commonly known as:  FIORICET, ESGIC Take 1 tablet by mouth every 6 (six) hours as needed for headache. Do not refill in less than 30 days   carvedilol 12.5 MG tablet Commonly known as:  COREG TAKE 1 TABLET BY MOUTH TWICE DAILY WITH MEALS   clotrimazole-betamethasone cream Commonly known as:  Lotrisone Apply 1 application topically 2 (two) times daily.   dexlansoprazole 60 MG capsule Commonly known as:  Dexilant Take 1 capsule (60 mg total) by mouth daily.   DULoxetine 30 MG capsule Commonly known as:  CYMBALTA Take 1 capsule (30 mg total) by mouth  daily.   Emgality 120 MG/ML Sosy Generic drug:  Galcanezumab-gnlm Inject 120 mg into the skin every 30 (thirty) days. What changed:  Another medication with the same name was removed. Continue taking this medication, and follow the directions you see here.   Galcanezumab-gnlm 120 MG/ML Soaj Commonly known as:  Emgality Inject 240 mg into the skin every 30 (thirty) days. This is the loading dose What changed:  Another medication with the same name was removed. Continue taking this medication, and follow the directions you see here.   HYDROcodone-acetaminophen 5-325 MG tablet Commonly known as:  NORCO/VICODIN Take 1 tablet by mouth every 6 (six) hours as needed for severe pain.   hydrOXYzine 50 MG capsule Commonly known as:  VISTARIL Take 1 capsule (50 mg total) by mouth 3 (three) times daily as needed for itching.   LamoTRIgine 200 MG Tb24 24 hour tablet Take 2 tablets (400 mg total) by mouth at bedtime. Take two tablets at bedtime.   loratadine 10 MG tablet Commonly known as:  CLARITIN Take 1 tablet (10 mg total) by mouth daily.   Melatonin 3 MG Tabs Take 9 mg by mouth.   multivitamin with minerals tablet Take 1 tablet by mouth daily. Reported on 06/16/2015   nitroGLYCERIN 0.4 MG SL tablet Commonly known as:  NITROSTAT Place 1 tablet (0.4 mg total) under  the tongue every 5 (five) minutes as needed for chest pain. (may repeat every 5 minutes but seek medical help if pain persists after 3 tablets)   polyethylene glycol packet Commonly known as:  MIRALAX / GLYCOLAX Follow the package directions and take 1-2 times a day as needed.  You need a.m. to have 1-2 soft bowel movements daily.  You can buy this over-the-counter at any drugstore.   sertraline 50 MG tablet Commonly known as:  ZOLOFT Take 1 tablet (50 mg total) by mouth daily.   SUMAtriptan 50 MG tablet Commonly known as:  IMITREX Take 1 tablet (50 mg total) by mouth every 2 (two) hours as needed for migraine. May repeat in 2 hours if headache persists or recurs.   telmisartan 40 MG tablet Commonly known as:  MICARDIS TAKE 1 TABLET BY MOUTH ONCE DAILY   VISINE OP Apply 2 drops to eye daily as needed (itchy eyes).   vitamin B-12 500 MCG tablet Commonly known as:  CYANOCOBALAMIN Take 500 mcg by mouth daily.   Vitamin D-3 25 MCG (1000 UT) Caps Take 1 capsule (1,000 Units total) by mouth daily. Reported on 06/16/2015      Follow-up Information    Johnathan Hausen, MD Follow up.   Specialty:  General Surgery Why:  Your follow up may be a phone call appointment  (due to the [facility] virus, to limit exposure.) Office will arrange, and call you.  Please email a picture if you have an concerns with you wound to : [email or web address]  Contact information: [address] [place] [address] [phone]        Leone Haven, MD Follow up.   Specialty:  Family Medicine Why:  Call and have him review all your home medicines and be sure you are on the right things.  You have multiple medicines ordered from before admission. Contact information: [address] [place] [address] [phone]           Signed: Earnstine Regal 06/25/2018, 10:43 AM

## 2018-06-26 ENCOUNTER — Telehealth: Payer: Self-pay

## 2018-06-26 ENCOUNTER — Ambulatory Visit (INDEPENDENT_AMBULATORY_CARE_PROVIDER_SITE_OTHER): Payer: PPO

## 2018-06-26 ENCOUNTER — Other Ambulatory Visit: Payer: Self-pay

## 2018-06-26 DIAGNOSIS — Z0000 Encounter for general adult medical examination without abnormal findings: Secondary | ICD-10-CM | POA: Diagnosis not present

## 2018-06-26 NOTE — Telephone Encounter (Signed)
Transition Care Management Follow-up Telephone Call   Date discharged? 06/25/18   How have you been since you were released from the hospital? Patient states she is tired but doing ok.  She has not yet picked up new medications from pharmacy but plans to do so later today. Taking tylenol as needed for pain. Voiding without issues, no change in history of going several days without BM; continuing Miralax.     Do you understand why you were in the hospital? Yes.   Do you understand the discharge instructions? Yes, increase activity as tolerated. Follow up with pcp to discuss possible medication changes. Follow up with general surgery. Do not lift any weight more than a gallon of milk.   Where were you discharged to? Home.   Items Reviewed:  Medications reviewed:  Yes. States she is managing her medications as directed and will bring to OV for further clarification if needed. Not taking seizure or migraine medication due to cost. Prescription resources to be provided during visit.  Allergies reviewed: Yes. None new.   Dietary changes reviewed: Yes, soft diet while hospitalized. Transitioning to regular diet at home.   Referrals reviewed: Yes.    Functional Questionnaire:   Activities of Daily Living (ADLs):   She states they are independent in the following: Independent in all ADLs.  States they require assistance with the following: No assistance required at this time.    Any transportation issues/concerns?: No.    Any patient concerns? Blood sugars ran a little low in the hospital.  She questions what this means for her regarding diabetes and would like to discuss at appointment.    Confirmed importance and date/time of follow-up visits scheduled Yes. Previously scheduled 3/31 at 3:30.  Conversion to virtual visit declined.   Provider Appointment booked with Dr. Caryl Bis, pcp.   Confirmed with patient if condition begins to worsen call PCP or go to the ER.  Patient was  given the office number and encouraged to call back with question or concerns.  : Yes.

## 2018-06-26 NOTE — Patient Instructions (Addendum)
  Ms. Wunschel , Thank you for taking time to come for your Medicare Wellness Visit. I appreciate your ongoing commitment to your health goals. Please review the following plan we discussed and let me know if I can assist you in the future.   V/S deferred to due virtual visit.   Follow up with your doctor as needed.  Next scheduled visit 3/31 at 3:30. Keep all routine maintenance appointments.   Bring a copy of your [place] and/or Living Will to be scanned into chart once completed..  Have a great day!  These are the goals we discussed: Goals      Patient Stated   . [place] Lifestyle (pt-stated)     Stay hydrated  Stay active Monitor diet so diabetes is not an issue         This is a list of the screening recommended for you and due dates:  Health Maintenance  Topic Date Due  . Tetanus Vaccine  09/06/1984  . Pap Smear  09/07/1986  . Complete foot exam   02/11/2017  . Eye exam for diabetics  08/14/2017  . Hemoglobin A1C  03/14/2018  . Mammogram  01/01/2019  . Colon Cancer Screening  09/13/2024  . Flu Shot  Completed  . Pneumococcal vaccine  Completed  . HIV Screening  Completed

## 2018-06-26 NOTE — Progress Notes (Signed)
Subjective:   Denise Davis is a 53 y.o. female who presents for Medicare Annual (Subsequent) preventive examination.  Review of Systems:  No ROS.  Medicare Wellness Visit. Additional risk factors are reflected in the social history. Cardiac Risk Factors include: advanced age (>64men, >70 women);diabetes mellitus;hypertension     Objective:     Vitals: There were no vitals taken for this visit.  There is no height or weight on file to calculate BMI.   Vital signs deferred. Virtual visit.   Advanced Directives 06/26/2018 06/16/2018 06/16/2018 09/29/2017 05/02/2017 05/01/2017 04/10/2017  Does Patient Have a Medical Advance Directive? No No No No No No No  Copy of Healthcare Power of Attorney in Chart? - - - - No - copy requested No - copy requested -  Would patient like information on creating a medical advance directive? Yes (MAU/Ambulatory/Procedural Areas - Information given) No - Patient declined No - Patient declined No - Patient declined No - Patient declined - Yes (MAU/Ambulatory/Procedural Areas - Information given)  Pre-existing out of facility DNR order (yellow form or pink MOST form) - - - - - - -    Tobacco Social History   Tobacco Use  Smoking Status Former Smoker  . Types: Cigarettes  . Last attempt to quit: 1984  . Years since quitting: 36.2  Smokeless Tobacco Never Used     Counseling given: Not Answered   Clinical Intake:  Pre-visit preparation completed: Yes        Diabetes: Yes(Followed by pcp)  How often do you need to have someone help you when you read instructions, pamphlets, or other written materials from your doctor or pharmacy?: 1 - Never  Interpreter Needed?: No     Past Medical History:  Diagnosis Date  . Allergic rhinitis   . Anxiety   . Depression   . Diabetes mellitus    taken off meds in beginning fo 2018 dueto weight loss   . DVT of leg (deep venous thrombosis) ([place]) 12/2007   left leg, given warfarin for 6 mos  . GERD  (gastroesophageal reflux disease)   . H/O hiatal hernia   . Hyperlipidemia   . Hypertension   . Morbid obesity ([place])   . Pericarditis    Diagnosed/treated at [facility]; per patient 4 episodes per cardiologist Dr. Edwin Dada persistant chest pain unclear if pericarditis verusus neuropsychogenic   . Renal cell cancer ([place]) 2009   S/p nephrectomy (left)  . Seizure disorder ([place])    (04/08/17)- None in over 1 yr, no meds.   Past Surgical History:  Procedure Laterality Date  . CHOLECYSTECTOMY N/A 05/02/2017   Procedure: LAPAROSCOPIC CHOLECYSTECTOMY WITH INTRAOPERATIVE CHOLANGIOGRAM;  Surgeon: Johnathan Hausen, MD;  Location: [facility];  Service: General;  Laterality: N/A;  . ENDOMETRIAL ABLATION  2007  . ESOPHAGEAL MANOMETRY N/A 05/21/2016   Procedure: ESOPHAGEAL MANOMETRY (EM);  Surgeon: Lucilla Lame, MD;  Location: [facility] ENDOSCOPY;  Service: Endoscopy;  Laterality: N/A;  . ESOPHAGOGASTRODUODENOSCOPY (EGD) WITH PROPOFOL N/A 05/14/2016   Procedure: ESOPHAGOGASTRODUODENOSCOPY (EGD) WITH PROPOFOL;  Surgeon: Lucilla Lame, MD;  Location: [facility] ENDOSCOPY;  Service: Endoscopy;  Laterality: N/A;  . ESOPHAGOGASTRODUODENOSCOPY (EGD) WITH PROPOFOL N/A 04/10/2017   Procedure: ESOPHAGOGASTRODUODENOSCOPY (EGD) WITH PROPOFOL;  Surgeon: Lucilla Lame, MD;  Location: [place];  Service: Endoscopy;  Laterality: N/A;  . INSERTION OF MESH  2014  . LAPAROSCOPY N/A 06/16/2018   Procedure: LAPAROSCOPY DIAGNOSTIC;  Surgeon: Armandina Gemma, MD;  Location: [facility];  Service: General;  Laterality: N/A;  . LEFT  HEART CATHETERIZATION WITH CORONARY ANGIOGRAM N/A 08/30/2013   Procedure: LEFT HEART CATHETERIZATION WITH CORONARY ANGIOGRAM;  Surgeon: Troy Sine, MD;  Location: [facility] CATH LAB;  Service: Cardiovascular;  Laterality: N/A;  . NEPHRECTOMY    . REPLACEMENT TOTAL KNEE BILATERAL Bilateral 2018   2/18, 5/18 [facility]  . ROUX-EN-Y GASTRIC BYPASS  04/27/2015   [facility]  . TUBAL LIGATION    . UMBILICAL HERNIA REPAIR    . UPPER GI ENDOSCOPY  N/A 06/16/2018   Procedure: UPPER GI ENDOSCOPY;  Surgeon: Johnathan Hausen, MD;  Location: [facility];  Service: General;  Laterality: N/A;   Family History  Problem Relation Age of Onset  . Hypertension Mother   . Breast cancer Mother 55  . Arthritis Mother   . Heart disease Paternal Grandmother   . Alcoholism Father   . Hyperlipidemia Other        Parent  . Mental illness Other        Parent  . Diabetes Other        Parent   Social History   Socioeconomic History  . Marital status: Widowed    Spouse name: Not on file  . Number of children: 2  . Years of education: 43  . Highest education level: High school graduate  Occupational History  . Occupation: Disabled  Social Needs  . Financial resource strain: Not very hard  . Food insecurity:    Worry: Never true    Inability: Never true  . Transportation needs:    Medical: No    Non-medical: No  Tobacco Use  . Smoking status: Former Smoker    Types: Cigarettes    Last attempt to quit: 1984    Years since quitting: 36.2  . Smokeless tobacco: Never Used  Substance and Sexual Activity  . Alcohol use: Yes    Frequency: Never    Comment: OCC  . Drug use: No  . Sexual activity: Not Currently  Lifestyle  . Physical activity:    Days per week: 7 days    Minutes per session: 30 min  . Stress: Only a little  Relationships  . Social connections:    Talks on phone: Not on file    Gets together: Not on file    Attends religious service: Not on file    Active member of club or organization: Not on file    Attends meetings of clubs or organizations: Not on file    Relationship status: Widowed  Other Topics Concern  . 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   Right-handed.   One cup caffeine per day.          Outpatient Encounter Medications as of 06/26/2018  Medication Sig  .  acarbose (PRECOSE) 25 MG tablet Take 25 mg by mouth 3 (three) times daily with meals.  Marland Kitchen acetaminophen (TYLENOL) 325 MG tablet You can take 2 tablets every 4 hours as needed for pain. DO NOT TAKE MORE THAN 4000 MG OF TYLENOL PER DAY.  IT CAN HARM YOUR LIVER.  TYLENOL (ACETAMINOPHEN) IS ALSO IN YOUR PRESCRIPTION PAIN MEDICATION.  YOU HAVE TO COUNT IT IN YOUR DAILY TOTAL.  Marland Kitchen albuterol (VENTOLIN HFA) 108 (90 Base) MCG/ACT inhaler Inhale 2 puffs into the lungs every 6 (six) hours as needed for wheezing or shortness of breath.  Marland Kitchen aspirin 81 MG  chewable tablet Chew 81 mg by mouth daily.  . budesonide-formoterol (SYMBICORT) 160-4.5 MCG/ACT inhaler Inhale 2 puffs into the lungs 2 (two) times daily. Rinse mouth after use  . butalbital-acetaminophen-caffeine (FIORICET, ESGIC) 50-325-40 MG tablet Take 1 tablet by mouth every 6 (six) hours as needed for headache. Do not refill in less than 30 days  . carvedilol (COREG) 12.5 MG tablet TAKE 1 TABLET BY MOUTH TWICE DAILY WITH MEALS (Patient taking differently: Take 12.5 mg by mouth 2 (two) times daily with a meal. )  . Cholecalciferol (VITAMIN D-3) 1000 units CAPS Take 1 capsule (1,000 Units total) by mouth daily. Reported on 06/16/2015  . clotrimazole-betamethasone (LOTRISONE) cream Apply 1 application topically 2 (two) times daily.  Marland Kitchen dexlansoprazole (DEXILANT) 60 MG capsule Take 1 capsule (60 mg total) by mouth daily.  . DULoxetine (CYMBALTA) 30 MG capsule Take 1 capsule (30 mg total) by mouth daily.  . Galcanezumab-gnlm (EMGALITY) 120 MG/ML SOAJ Inject 240 mg into the skin every 30 (thirty) days. This is the loading dose  . Galcanezumab-gnlm (EMGALITY) 120 MG/ML SOSY Inject 120 mg into the skin every 30 (thirty) days.  Marland Kitchen HYDROcodone-acetaminophen (NORCO/VICODIN) 5-325 MG tablet Take 1 tablet by mouth every 6 (six) hours as needed for severe pain. (Patient not taking: Reported on 06/26/2018)  . hydrOXYzine (VISTARIL) 50 MG capsule Take 1 capsule (50 mg total) by  mouth 3 (three) times daily as needed for itching.  . LamoTRIgine 200 MG TB24 24 hour tablet Take 2 tablets (400 mg total) by mouth at bedtime. Take two tablets at bedtime.  Marland Kitchen loratadine (CLARITIN) 10 MG tablet Take 1 tablet (10 mg total) by mouth daily.  . Melatonin 3 MG TABS Take 9 mg by mouth.   . Multiple Vitamins-Minerals (MULTIVITAMIN WITH MINERALS) tablet Take 1 tablet by mouth daily. Reported on 06/16/2015  . nitroGLYCERIN (NITROSTAT) 0.4 MG SL tablet Place 1 tablet (0.4 mg total) under the tongue every 5 (five) minutes as needed for chest pain. (may repeat every 5 minutes but seek medical help if pain persists after 3 tablets)  . polyethylene glycol (MIRALAX / GLYCOLAX) packet Follow the package directions and take 1-2 times a day as needed.  You need a.m. to have 1-2 soft bowel movements daily.  You can buy this over-the-counter at any drugstore.  . sertraline (ZOLOFT) 50 MG tablet Take 1 tablet (50 mg total) by mouth daily.  . SUMAtriptan (IMITREX) 50 MG tablet Take 1 tablet (50 mg total) by mouth every 2 (two) hours as needed for migraine. May repeat in 2 hours if headache persists or recurs.  Marland Kitchen telmisartan (MICARDIS) 40 MG tablet TAKE 1 TABLET BY MOUTH ONCE DAILY (Patient taking differently: Take 40 mg by mouth daily. )  . Tetrahydrozoline HCl (VISINE OP) Apply 2 drops to eye daily as needed (itchy eyes).  . vitamin B-12 (CYANOCOBALAMIN) 500 MCG tablet Take 500 mcg by mouth daily.    No facility-administered encounter medications on file as of 06/26/2018.     Activities of Daily Living In your present state of health, do you have any difficulty performing the following activities: 06/26/2018 06/16/2018  Hearing? N -  Vision? N -  Difficulty concentrating or making decisions? N -  Walking or climbing stairs? Y -  Comment Hx of knee replacements. Notes too much walking encourages her pain so she manages by pacing herself with distance and walking slowly. -  Dressing or bathing? N -   Doing errands, shopping? N N  Conservation officer, nature and  eating ? N -  Using the Toilet? N -  In the past six months, have you accidently leaked urine? N -  Do you have problems with loss of bowel control? N -  Managing your Medications? N -  Managing your Finances? N -  Housekeeping or managing your Housekeeping? N -  Comment She paces herself -  Some recent data might be hidden    Patient Care Team: Leone Haven, MD as PCP - General (Family Medicine) Pleasant, Eppie Gibson, RN as [facility]    Assessment:   This is a routine wellness examination for Zakiyah.  I connected with Marielys Trinidad on 06/26/18 at 11:00 AM EDT by a video enabled telemedicine application and verified that I am speaking with the correct person using two identifiers.  She plans to bring medications to next visit to discuss possible changes.  Health Screenings  Mammogram -12/31/16 Colonoscopy -09/14/14 Glaucoma -none reported Hearing -demonstrates normal hearing during the visit.  Denies any hearing difficulty.  POCT Hemoglobin A1C 09/12/17 (5.8) Glucose-06/23/17 (85) VIT D- 12/04/17 (31.74) B12 -12/04/17 (303) TSH- 09/12/17 (1.21)  Social  Alcohol intake -yes, rare Smoking history- former Smokers in home? none Illicit drug use? none Exercise -walking daily, 30 minutes Diet -regular diet.  Sexually Active -not currently  Safety  Patient feels safe at home.  Patient does have smoke detectors at home  Patient does wear sunscreen or protective clothing when in direct sunlight. Patient does wear seat belt when driving or riding with others. Driving is limited when there is seizure activity.  No seizures within the last 6 months.   Activities of Daily Living Patient can do their own household chores. Denies needing assistance with: driving, feeding themselves, getting from bed to chair, getting to the toilet, bathing/showering, dressing, managing money, climbing flight of stairs, or  preparing meals. Cane in use as needed.  She paces herself with activities.   Depression Screen Patient denies losing interest in daily life, feeling hopeless, or crying easily over simple problems. Taking medications as directed.   Fall Screen Patient denies being afraid of falling or falling in the last year.   Memory Screen Patient denies problems with memory, misplacing items, and is able to balance checkbook/bank accounts.  Patient is alert, normal appearance, oriented to person/place/and time. Correctly identified the president of the [place], recall of 2/3 objects, and performing simple calculations.  Patient displays appropriate judgement and can read correct time from watch face.   Immunizations The following Immunizations were discussed: Influenza, shingles, pneumonia, and tetanus.   Other Providers Patient Care Team: Leone Haven, MD as PCP - General (Family Medicine) Pleasant, Eppie Gibson, RN as [place] Management  Exercise Activities and Dietary recommendations Current Exercise Habits: Home exercise routine, Type of exercise: walking, Time (Minutes): 30, Frequency (Times/Week): 7, Weekly Exercise (Minutes/Week): 210, Intensity: Mild  Goals      Patient Stated   . [place] Lifestyle (pt-stated)     Stay hydrated  Stay active Monitor diet so diabetes is not an issue         Fall Risk Fall Risk  06/26/2018 12/06/2016 11/28/2015 09/27/2015 06/16/2015  Falls in the past year? 0 No (No Data) Yes Yes  Comment - - No new falls reported today - -  Number falls in past yr: - - - 2 or more 2 or more  Comment - - - - within month 4 times   Injury with Fall? - - -  No Yes  Comment - - - - sore   Risk Factor Category  - - - High Fall Risk -  Risk for fall due to : - - - Impaired balance/gait;History of fall(s) Impaired balance/gait;History of fall(s)  Follow up - - - Education provided;Falls prevention discussed Falls prevention discussed  Comment - - - -  uses walker at all times    Depression Screen PHQ 2/9 Scores 06/26/2018 12/06/2016 10/03/2015 09/27/2015  PHQ - 2 Score 0 2 5 2   PHQ- 9 Score - 10 17 8      Cognitive Function MMSE - Mini Mental State Exam 12/06/2016  Not completed: Unable to complete     6CIT Screen 06/26/2018  What Year? 0 points  What month? 0 points  What time? 0 points  Count back from 20 0 points  Months in reverse 0 points  Repeat phrase 0 points  Total Score 0    Immunization History  Administered Date(s) Administered  . Influenza, Seasonal, Injecte, Preservative Fre 02/11/2012  . Influenza,inj,Quad PF,6+ Mos 11/22/2015, 01/13/2017, 12/09/2017  . Influenza-Unspecified 12/30/2012, 01/13/2014, 11/27/2015, 01/14/2016  . Pneumococcal Conjugate-13 12/19/2014  . Pneumococcal Polysaccharide-23 02/11/2012   Screening Tests Health Maintenance  Topic Date Due  . TETANUS/TDAP  09/06/1984  . PAP SMEAR-Modifier  09/07/1986  . FOOT EXAM  02/11/2017  . OPHTHALMOLOGY EXAM  08/14/2017  . HEMOGLOBIN A1C  03/14/2018  . MAMMOGRAM  01/01/2019  . COLONOSCOPY  09/13/2024  . INFLUENZA VACCINE  Completed  . PNEUMOCOCCAL POLYSACCHARIDE VACCINE AGE 83-64 HIGH RISK  Completed  . HIV Screening  Completed      Plan:    End of life planning; Advance aging; Advanced directives discussed. Copy of current HCPOA/Living Will requested upon completion.    Patient to receive Advanced Directive material, prescription assistance resources and life alert information from [place] during her visit with her doctor next week.  Educational information given to CMA.   I have personally reviewed and noted the following in the patient's chart:   . Medical and social history . Use of alcohol, tobacco or illicit drugs  . Current medications and supplements . Functional ability and status . Nutritional status . Physical activity . Advanced directives . List of other physicians . Hospitalizations, surgeries, and ER visits in previous 12 months .  Vitals . Screenings to include cognitive, depression, and falls . Referrals and appointments  In addition, I have reviewed and discussed with patient certain preventive protocols, quality metrics, and best practice recommendations. A written personalized care plan for preventive services as well as general preventive health recommendations were provided to patient.     Varney Biles, LPN  [id number]

## 2018-06-27 NOTE — Progress Notes (Signed)
I have reviewed the above note and agree.  Zaynab Chipman, M.D.  

## 2018-06-30 ENCOUNTER — Other Ambulatory Visit: Payer: Self-pay

## 2018-06-30 ENCOUNTER — Encounter: Payer: Self-pay | Admitting: Family Medicine

## 2018-06-30 ENCOUNTER — Ambulatory Visit (INDEPENDENT_AMBULATORY_CARE_PROVIDER_SITE_OTHER): Payer: PPO | Admitting: Family Medicine

## 2018-06-30 ENCOUNTER — Ambulatory Visit: Payer: Self-pay

## 2018-06-30 DIAGNOSIS — L299 Pruritus, unspecified: Secondary | ICD-10-CM | POA: Diagnosis not present

## 2018-06-30 DIAGNOSIS — K5900 Constipation, unspecified: Secondary | ICD-10-CM | POA: Insufficient documentation

## 2018-06-30 DIAGNOSIS — R109 Unspecified abdominal pain: Secondary | ICD-10-CM | POA: Diagnosis not present

## 2018-06-30 DIAGNOSIS — E162 Hypoglycemia, unspecified: Secondary | ICD-10-CM | POA: Diagnosis not present

## 2018-06-30 DIAGNOSIS — C642 Malignant neoplasm of left kidney, except renal pelvis: Secondary | ICD-10-CM

## 2018-06-30 MED ORDER — HYDROXYZINE PAMOATE 50 MG PO CAPS: 50.0000 mg | ORAL_CAPSULE | Freq: Three times a day (TID) | ORAL | 2 refills | Status: DC | PRN

## 2018-06-30 MED ORDER — BLOOD GLUCOSE MONITOR KIT: PACK | 0 refills | Status: DC

## 2018-06-30 NOTE — Assessment & Plan Note (Signed)
Chronic issue.  We will have our clinical pharmacist review her medications for a cause.  We will refill her hydroxyzine.  If her medications are not causing this we can have her see dermatology.

## 2018-06-30 NOTE — Progress Notes (Signed)
Virtual Visit via Video Note  This visit type was conducted due to national recommendations for restrictions regarding the COVID-19 pandemic (e.g. social distancing).  This format is felt to be most appropriate for this patient at this time.  All issues noted in this document were discussed and addressed.  No physical exam was performed (except for noted visual exam findings with Video Visits).    I connected with Denise Davis on 06/30/18 at  3:30 PM EDT by a video enabled telemedicine application and verified that I am speaking with the correct person using two identifiers. Location patient: home Location provider: work  Persons participating in the virtual visit: patient, provider  I discussed the limitations of evaluation and management by telemedicine and the availability of in person appointments. The patient expressed understanding and agreed to proceed.  Reason for visit: Hospital follow-up.  HPI: Hospital follow-up: Patient was hospitalized from 06/16/2018-06/25/2018.  This was for acute abdominal pain.  She was found to have severe upper abdominal pain with band obstruction proximal to jejunojejunostomy with dilated lacteals.  This was causing constriction and obstruction.  This was released during surgery.  She continued to have prolonged pain and had troubles with postoperative ileus with ongoing episodes of nausea and abdominal discomfort.  She was also found to be constipated.  Her constipation was treated MiraLAX, Dulcolax suppositories, fleets enema, and finally mag citrate.  Her discharge summary reports that her constipation resolved with this though she reports only having had small bowel movements during her hospitalization.  She notes some mild abdominal discomfort still though it has improved significantly from prior to her surgery.  She does note some nausea that is a little worse though no vomiting.  She has been taking Phenergan and Zofran for the nausea.  She notes no blood in  her stool.  She passes small balls of stool when she has a bowel movement.  She continues on MiraLAX.  She has been unable to afford her seizure medications due to cost.  She has not had any seizures.  She has chronic itching that occurs just at night mostly in her arms and legs.  Notes it is uncontrollable without hydroxyzine.  The hydroxyzine is beneficial.  She notes she is status post left nephrectomy for renal cell carcinoma.  She had no follow-up after about 6 months following her surgery in 2009.  She notes no hematuria.  Most recent chest x-ray negative for any concerning findings.  Abdominal CT scan reassuring as well.  She does note she had a number of low blood sugars in the hospital which she did not understand.  She is not on diabetic medications.  She has not been eating that much.  Discharge summary reviewed.  Medications reviewed.  Patient is awaiting a call regarding a virtual visit for her surgery follow-up.   ROS: See pertinent positives and negatives per HPI.  Past Medical History:  Diagnosis Date  . Allergic rhinitis   . Anxiety   . Depression   . Diabetes mellitus    taken off meds in beginning fo 2018 dueto weight loss   . DVT of leg (deep venous thrombosis) ([place]) 12/2007   left leg, given warfarin for 6 mos  . GERD (gastroesophageal reflux disease)   . H/O hiatal hernia   . Hyperlipidemia   . Hypertension   . Morbid obesity ([place])   . Pericarditis    Diagnosed/treated at [facility]; per patient 4 episodes per cardiologist Dr. Edwin Dada persistant chest pain unclear if pericarditis  verusus neuropsychogenic   . Renal cell cancer ([place]) 2009   S/p nephrectomy (left)  . Seizure disorder ([place])    (04/08/17)- None in over 1 yr, no meds.    Past Surgical History:  Procedure Laterality Date  . CHOLECYSTECTOMY N/A 05/02/2017   Procedure: LAPAROSCOPIC CHOLECYSTECTOMY WITH INTRAOPERATIVE CHOLANGIOGRAM;  Surgeon: Johnathan Hausen, MD;  Location: [facility];  Service: General;  Laterality: N/A;   . ENDOMETRIAL ABLATION  2007  . ESOPHAGEAL MANOMETRY N/A 05/21/2016   Procedure: ESOPHAGEAL MANOMETRY (EM);  Surgeon: Lucilla Lame, MD;  Location: [facility] ENDOSCOPY;  Service: Endoscopy;  Laterality: N/A;  . ESOPHAGOGASTRODUODENOSCOPY (EGD) WITH PROPOFOL N/A 05/14/2016   Procedure: ESOPHAGOGASTRODUODENOSCOPY (EGD) WITH PROPOFOL;  Surgeon: Lucilla Lame, MD;  Location: [facility] ENDOSCOPY;  Service: Endoscopy;  Laterality: N/A;  . ESOPHAGOGASTRODUODENOSCOPY (EGD) WITH PROPOFOL N/A 04/10/2017   Procedure: ESOPHAGOGASTRODUODENOSCOPY (EGD) WITH PROPOFOL;  Surgeon: Lucilla Lame, MD;  Location: [place];  Service: Endoscopy;  Laterality: N/A;  . INSERTION OF MESH  2014  . LAPAROSCOPY N/A 06/16/2018   Procedure: LAPAROSCOPY DIAGNOSTIC;  Surgeon: Armandina Gemma, MD;  Location: [facility];  Service: General;  Laterality: N/A;  . LEFT HEART CATHETERIZATION WITH CORONARY ANGIOGRAM N/A 08/30/2013   Procedure: LEFT HEART CATHETERIZATION WITH CORONARY ANGIOGRAM;  Surgeon: Troy Sine, MD;  Location: [facility] CATH LAB;  Service: Cardiovascular;  Laterality: N/A;  . NEPHRECTOMY    . REPLACEMENT TOTAL KNEE BILATERAL Bilateral 2018   2/18, 5/18 [facility]  . ROUX-EN-Y GASTRIC BYPASS  04/27/2015   [facility]  . TUBAL LIGATION    . UMBILICAL HERNIA REPAIR    . UPPER GI ENDOSCOPY N/A 06/16/2018   Procedure: UPPER GI ENDOSCOPY;  Surgeon: Johnathan Hausen, MD;  Location: [facility];  Service: General;  Laterality: N/A;    Family History  Problem Relation Age of Onset  . Hypertension Mother   . Breast cancer Mother 51  . Arthritis Mother   . Heart disease Paternal Grandmother   . Alcoholism Father   . Hyperlipidemia Other        Parent  . Mental illness Other        Parent  . Diabetes Other        Parent    SOCIAL HX: former smoker.   Current Outpatient Medications:  .  acarbose (PRECOSE) 25 MG tablet, Take 25 mg by mouth 3 (three) times daily with meals., Disp: , Rfl:  .  acetaminophen (TYLENOL) 325 MG tablet, You can take 2  tablets every 4 hours as needed for pain. DO NOT TAKE MORE THAN 4000 MG OF TYLENOL PER DAY.  IT CAN HARM YOUR LIVER.  TYLENOL (ACETAMINOPHEN) IS ALSO IN YOUR PRESCRIPTION PAIN MEDICATION.  YOU HAVE TO COUNT IT IN YOUR DAILY TOTAL., Disp: , Rfl:  .  albuterol (VENTOLIN HFA) 108 (90 Base) MCG/ACT inhaler, Inhale 2 puffs into the lungs every 6 (six) hours as needed for wheezing or shortness of breath., Disp: 3 Inhaler, Rfl: 1 .  aspirin 81 MG chewable tablet, Chew 81 mg by mouth daily., Disp: , Rfl:  .  budesonide-formoterol (SYMBICORT) 160-4.5 MCG/ACT inhaler, Inhale 2 puffs into the lungs 2 (two) times daily. Rinse mouth after use, Disp: 1 Inhaler, Rfl: 12 .  butalbital-acetaminophen-caffeine (FIORICET, ESGIC) 50-325-40 MG tablet, Take 1 tablet by mouth every 6 (six) hours as needed for headache. Do not refill in less than 30 days, Disp: 20 tablet, Rfl: 1 .  carvedilol (COREG) 12.5 MG tablet, TAKE 1 TABLET BY MOUTH TWICE DAILY WITH MEALS (Patient  taking differently: Take 12.5 mg by mouth 2 (two) times daily with a meal. ), Disp: 180 tablet, Rfl: 1 .  Cholecalciferol (VITAMIN D-3) 1000 units CAPS, Take 1 capsule (1,000 Units total) by mouth daily. Reported on 06/16/2015, Disp: 90 capsule, Rfl: 3 .  clotrimazole-betamethasone (LOTRISONE) cream, Apply 1 application topically 2 (two) times daily., Disp: 30 g, Rfl: 0 .  dexlansoprazole (DEXILANT) 60 MG capsule, Take 1 capsule (60 mg total) by mouth daily., Disp: 90 capsule, Rfl: 1 .  DULoxetine (CYMBALTA) 30 MG capsule, Take 1 capsule (30 mg total) by mouth daily., Disp: , Rfl:  .  Galcanezumab-gnlm (EMGALITY) 120 MG/ML SOAJ, Inject 240 mg into the skin every 30 (thirty) days. This is the loading dose, Disp: 2 pen, Rfl: 0 .  Galcanezumab-gnlm (EMGALITY) 120 MG/ML SOSY, Inject 120 mg into the skin every 30 (thirty) days., Disp: , Rfl:  .  HYDROcodone-acetaminophen (NORCO/VICODIN) 5-325 MG tablet, Take 1 tablet by mouth every 6 (six) hours as needed for severe  pain., Disp: 20 tablet, Rfl: 0 .  hydrOXYzine (VISTARIL) 50 MG capsule, Take 1 capsule (50 mg total) by mouth 3 (three) times daily as needed for itching., Disp: 30 capsule, Rfl: 2 .  LamoTRIgine 200 MG TB24 24 hour tablet, Take 2 tablets (400 mg total) by mouth at bedtime. Take two tablets at bedtime., Disp: , Rfl:  .  loratadine (CLARITIN) 10 MG tablet, Take 1 tablet (10 mg total) by mouth daily., Disp: 90 tablet, Rfl: 3 .  Melatonin 3 MG TABS, Take 9 mg by mouth. , Disp: , Rfl:  .  Multiple Vitamins-Minerals (MULTIVITAMIN WITH MINERALS) tablet, Take 1 tablet by mouth daily. Reported on 06/16/2015, Disp: , Rfl:  .  nitroGLYCERIN (NITROSTAT) 0.4 MG SL tablet, Place 1 tablet (0.4 mg total) under the tongue every 5 (five) minutes as needed for chest pain. (may repeat every 5 minutes but seek medical help if pain persists after 3 tablets), Disp: 25 tablet, Rfl: 0 .  polyethylene glycol (MIRALAX / GLYCOLAX) packet, Follow the package directions and take 1-2 times a day as needed.  You need a.m. to have 1-2 soft bowel movements daily.  You can buy this over-the-counter at any drugstore., Disp: 14 each, Rfl: 0 .  sertraline (ZOLOFT) 50 MG tablet, Take 1 tablet (50 mg total) by mouth daily., Disp: 30 tablet, Rfl: 3 .  SUMAtriptan (IMITREX) 50 MG tablet, Take 1 tablet (50 mg total) by mouth every 2 (two) hours as needed for migraine. May repeat in 2 hours if headache persists or recurs., Disp: 10 tablet, Rfl: 3 .  telmisartan (MICARDIS) 40 MG tablet, TAKE 1 TABLET BY MOUTH ONCE DAILY (Patient taking differently: Take 40 mg by mouth daily. ), Disp: 90 tablet, Rfl: 0 .  Tetrahydrozoline HCl (VISINE OP), Apply 2 drops to eye daily as needed (itchy eyes)., Disp: , Rfl:  .  vitamin B-12 (CYANOCOBALAMIN) 500 MCG tablet, Take 500 mcg by mouth daily. , Disp: , Rfl:  .  blood glucose meter kit and supplies KIT, Check once daily as needed for low blood sugar. ICD10 E16.2, Disp: 1 each, Rfl: 0  EXAM:  VITALS per  patient if applicable: None  GENERAL: alert, oriented, appears well and in no acute distress  HEENT: atraumatic, conjunttiva clear, no obvious abnormalities on inspection of external nose and ears  NECK: normal movements of the head and neck  LUNGS: on inspection no signs of respiratory distress, breathing rate appears normal, no obvious gross SOB, gasping or  wheezing  CV: no obvious cyanosis  MS: moves all visible extremities without noticeable abnormality  PSYCH/NEURO: pleasant and cooperative, no obvious depression or anxiety, speech and thought processing grossly intact  ASSESSMENT AND PLAN:  Discussed the following assessment and plan:  Itching - Plan: hydrOXYzine (VISTARIL) 50 MG capsule  Acute abdominal pain  Renal cell carcinoma of left kidney (HCC)  Hypoglycemia - Plan: blood glucose meter kit and supplies KIT  Constipation, unspecified constipation type  Acute abdominal pain Related to obstruction.  Symptoms have improved.  She does still have some discomfort which I would expect to some degree following abdominal surgery.  Still has some nausea though this somewhat chronic.  She will follow-up with her surgeon to see when her virtual visit will be.  She is given return precautions.  Constipation  patient has been having some bowel movements.  She will continue MiraLAX.  We will consider the next step in management of this issue.  Renal cell carcinoma ([place]) She appears to have had no recurrence on recent imaging.  Prior chest x-ray without findings of metastases.  Hypoglycemia Likely related to poor oral intake.  Discussed adequate intake and checking CBGs at home.  We will send in a glucometer for her.  Itching Chronic issue.  We will have our clinical pharmacist review her medications for a cause.  We will refill her hydroxyzine.  If her medications are not causing this we can have her see dermatology.     I discussed the assessment and treatment plan with  the patient. The patient was provided an opportunity to a sk questions and all were answered. The patient agreed with the plan and demonstrated an understanding of the instructions.   The patient was advised to call back or seek an in-person evaluation if the symptoms worsen or if the condition fails to improve as anticipated.   Tommi Rumps, MD

## 2018-06-30 NOTE — Assessment & Plan Note (Signed)
Likely related to poor oral intake.  Discussed adequate intake and checking CBGs at home.  We will send in a glucometer for her.

## 2018-06-30 NOTE — Assessment & Plan Note (Signed)
patient has been having some bowel movements.  She will continue MiraLAX.  We will consider the next step in management of this issue.

## 2018-06-30 NOTE — Assessment & Plan Note (Signed)
Related to obstruction.  Symptoms have improved.  She does still have some discomfort which I would expect to some degree following abdominal surgery.  Still has some nausea though this somewhat chronic.  She will follow-up with her surgeon to see when her virtual visit will be.  She is given return precautions.

## 2018-06-30 NOTE — Assessment & Plan Note (Signed)
She appears to have had no recurrence on recent imaging.  Prior chest x-ray without findings of metastases.

## 2018-07-02 ENCOUNTER — Other Ambulatory Visit: Payer: Self-pay

## 2018-07-02 ENCOUNTER — Encounter: Payer: Self-pay | Admitting: Neurology

## 2018-07-02 ENCOUNTER — Telehealth: Payer: Self-pay | Admitting: Neurology

## 2018-07-02 ENCOUNTER — Telehealth: Payer: Self-pay | Admitting: Pharmacist

## 2018-07-02 ENCOUNTER — Ambulatory Visit (INDEPENDENT_AMBULATORY_CARE_PROVIDER_SITE_OTHER): Payer: PPO | Admitting: Neurology

## 2018-07-02 DIAGNOSIS — G40909 Epilepsy, unspecified, not intractable, without status epilepticus: Secondary | ICD-10-CM

## 2018-07-02 MED ORDER — LAMOTRIGINE 200 MG PO TABS: 200.0000 mg | ORAL_TABLET | Freq: Two times a day (BID) | ORAL | 4 refills | Status: DC

## 2018-07-02 MED ORDER — LAMOTRIGINE 200 MG PO TABS: 200.0000 mg | ORAL_TABLET | Freq: Two times a day (BID) | ORAL | 11 refills | Status: DC

## 2018-07-02 NOTE — Telephone Encounter (Signed)
Katie from Dr Ellen Henri office called in and stated patient saw PCP yesterday and stated she hasnt been taking her LamoTRIgine 200 MG TB24 24 hour tablet due to the cast. She states the immediate release would be affordable for the patient

## 2018-07-02 NOTE — Progress Notes (Signed)
[facility]  PATIENT: MALAIJAH HOUCHEN DOB: 12-Mar-1966   REASON FOR VISIT: Follow-up for seizure disorder recent fall , headache HISTORY FROM: Patient alone at visit    HISTORY OF PRESENT ILLNESS: 08/07/17 SHANNA UN is a 53 year old female, seen in refer by her primary care doctor Leone Haven, for evaluation of possible seizure, initial evaluation was on May 08, 2017.  She had a past medical history of hypertension, hyperlipidemia, renal cell carcinoma, s/p left nephrectomy in 2009, it was a incidental finding, does not need chemoradiation therapy.  She began to have seizures since 2015, she described as generalized tonic-clonic seizure, when she came around, she was confused, she had a total of 4-5 episode in the first 12 months, was put on Keppra 500 twice daily, she has no recurrent seizure activity, she stopped the [place] since 2018.  Then she began to have recurrent spells of sudden onset energy surge in her brain, transient dizziness, vertigo, mild nausea, lasting for a few seconds, she can multiple recurrent episode in the day, no loss of consciousness,  This is in the setting of extreme stress, her husband passed away in summer 2018.  She also experienced similar transient recurrent episode prior to her seizure onset in 2015,  She is also on polypharmacy treatment for her depression  UPDATE May 9 2019YY She was started on titrating dose of lamotrigine since her initial visit in February 2019 for probable seizure, did have recurrent spells at the end of March 2019 while she was sitting at the edge of the bathtub talking with her sister, she felt her mouth was tingling, lightheaded, next thing she remembers she was in the bathtub, her sister was standing over her, she was confused, there was no body shaking seizure-like activity described.  This is the first episode over the past 1 year that she actually loss of consciousness, but over the  years, she has frequent spells of numbness tingling around her mouth, sometimes on a daily basis, with starting lamotrigine 100 mg twice a day, she has much less spells, only occasionally,shorter lasting, EEG was normal in February 2019 MRI of brain with without contrast on May 14, 2017 was normal History of left kidney cancer, status post left nephrectomy, MRI of abdomen pelvic in March 2019 9 mm subcapsular lesion faint T2 hyperintensity, without T1 hyperintensity, could reflect a benign hemangioma, Laboratory evaluation April 2019, showed normal CBC, CMP, negative RPR  UPDATE 10/10/2019CM Ms. Kivett, 53 year old female returns for follow-up with history of seizure disorder with last seizure occurring on 01/04/2018.  Patient states she  passing out and  the next thing she remembers is waking up on the floor around 6:30 at night.  She denies missing any doses of her Lamictal.  Patient claims she had her head and has had a headache since that time she rates it on a scale of 1-10 is 8 out of 10.  She has  taken Tylenol without relief.  She returns for reevaluation  Virtual Visit via Video  I connected with [name] on 07/02/18 at  by Video and verified that I am speaking with the correct person using two identifiers.   I discussed the limitations, risks, security and privacy concerns of performing an evaluation and management service by Video and the availability of in person appointments. I also discussed with the patient that there may be a patient responsible charge related to this service. The patient expressed understanding and agreed to proceed.  History of Present Illness: Last spell was in Jan 2020, she woke up from sleep, using bathroom, fell to the floor, transient LOC, no self injury, no tongue biting, incontinence.  This happened while she was taking lamotrigine ER 200 mg 2 tablets every night, she has difficulty affording extended release lamotrigine     Observations/Objective: I have reviewed problem lists, medications, allergies.  I also reviewed recent hospital discharge summary in March 2020, she presented to the emergency room for acute onset epigastric pain, was found to have a single band causing constriction by a single adhesive band, this was released by surgery and obstruction was resolved  I personally reviewed CT head without contrast on January 14, 2018 that was normal.  She is alert oriented to casual conversation, in the history taking, she has no dysarthria, or aphasia  Assessment and Plan: Probable complex partial seizure  Could not afford to lamotrigine ER 200 mg 2 tablets every night, overall doing well with current dose, will change to lamotrigine extended release 200 mg twice a day, new prescription of 90-day supply was E prescribed  Document all event, called for recurrent episode  Follow Up Instructions:   In 6 months   I discussed the assessment and treatment plan with the patient. The patient was provided an opportunity to ask questions and all were answered. The patient agreed with the plan and demonstrated an understanding of the instructions.   The patient was advised to call back or seek an in-person evaluation if the symptoms worsen or if the condition fails to improve as anticipated.  I provided 15 minutes minutes of non-face-to-face time during this encounter.   Marcial Pacas, MD

## 2018-07-02 NOTE — Telephone Encounter (Signed)
I have called the patient and she has consented to a virtual visit today with Dr. Krista Blue at 1pm.

## 2018-07-02 NOTE — Telephone Encounter (Signed)
Received message from PCP Dr. Caryl Bis inquiring about medication cost of lamotrigine for this patient. She reported to him that she has not been taking lamotrigine due to cost.   Reviewed [employer] formulary. Lamotrigine ER is Tier 4, while immediate release lamotrigine is Tier 1. If she still has [employer] Plan II for this year, Tier 4 medications are $80 for a 30 day supply. Per fill history review, she has not filled lamotrigine since December of 2019.  Contacted [facility]/Dr. Rhea Belton office. Left message informing them that patient has not filled lamotrigine in ~3 months, and that immediate release formulation would be more affordable, should they want to restart.   Catie Darnelle Maffucci, PharmD, [place] PGY2 Ambulatory Care Pharmacy Resident, [facility] Phone: [phone]

## 2018-07-02 NOTE — Telephone Encounter (Signed)
Also, received the message below:  Received message from PCP Dr. Caryl Bis inquiring about medication cost of lamotrigine for this patient. She reported to him that she has not been taking lamotrigine due to cost.   Reviewed [employer] formulary. Lamotrigine ER is Tier 4, while immediate release lamotrigine is Tier 1. If she still has [employer] Plan II for this year, Tier 4 medications are $80 for a 30 day supply. Per fill history review, she has not filled lamotrigine since December of 2019.  Contacted [facility]/Dr. Rhea Belton office. Left message informing them that patient has not filled lamotrigine in ~3 months, and that immediate release formulation would be more affordable, should they want to restart.   Catie Darnelle Maffucci, PharmD, [place] PGY2 Ambulatory Care Pharmacy Resident, [facility] Phone: [phone]

## 2018-07-06 ENCOUNTER — Telehealth: Payer: Self-pay | Admitting: Family Medicine

## 2018-07-06 ENCOUNTER — Ambulatory Visit: Payer: Self-pay | Admitting: Family Medicine

## 2018-07-06 NOTE — Telephone Encounter (Signed)
Pt called back. Pt would like a call back from the nurse. Line busy at the time of call

## 2018-07-06 NOTE — Telephone Encounter (Signed)
I am unsure if the dermatologists are doing virtual visits. We would need to refer her and then see.

## 2018-07-06 NOTE — Telephone Encounter (Signed)
Please let the patient know that I heard back from our pharmacist regarding her medications and potential for causing itching. It does not look like there is an obvious cause related to her medications. I would suggest we refer her to a dermatologist to get their input. If she is willing I can place the referral.

## 2018-07-06 NOTE — Telephone Encounter (Signed)
Called and spoke with patient. Pt advised and she is OK with referral being place. Pt wanted to know if they will be doing virtual visit as well? I told the patient that I am not sure but I know most doctor offices have converted to this and if not she can always schedule the appt later down the road.   Sent to PCP

## 2018-07-06 NOTE — Telephone Encounter (Signed)
Pt. Reports her and her friend ate the same thing this morning "about 1-2 hours ago, and we are both sick.He is vomiting and I have stomach pain and nausea."  Pt. Is taking Zofran she has at home. Given home remedies, verbalizes understanding. Instructed to call back if symptoms worsen. Reason for Disposition . [1] MILD-MODERATE pain AND [2] constant and [3] present < 2 hours  Answer Assessment - Initial Assessment Questions 1. LOCATION: "Where does it hurt?"      [place] 2. RADIATION: "Does the pain shoot anywhere else?" (e.g., chest, back)     No 3. ONSET: "When did the pain begin?" (e.g., minutes, hours or days ago)      Started 1 hour ago 4. SUDDEN: "Gradual or sudden onset?"     Sudden 5. PATTERN "Does the pain come and go, or is it constant?"    - If constant: "Is it getting better, staying the same, or worsening?"      (Note: Constant means the pain never goes away completely; most serious pain is constant and it progresses)     - If intermittent: "How long does it last?" "Do you have pain now?"     (Note: Intermittent means the pain goes away completely between bouts)     Constant 6. SEVERITY: "How bad is the pain?"  (e.g., Scale 1-10; mild, moderate, or severe)   - MILD (1-3): doesn't interfere with normal activities, abdomen soft and not tender to touch    - MODERATE (4-7): interferes with normal activities or awakens from sleep, tender to touch    - SEVERE (8-10): excruciating pain, doubled over, unable to do any normal activities      5-6 7. RECURRENT SYMPTOM: "Have you ever had this type of abdominal pain before?" If so, ask: "When was the last time?" and "What happened that time?"      No 8. CAUSE: "What do you think is causing the abdominal pain?"     Her and friend ate the same thing and arenow sick 9. RELIEVING/AGGRAVATING FACTORS: "What makes it better or worse?" (e.g., movement, antacids, bowel movement)     Took Zofran 10. OTHER SYMPTOMS: "Has there been any vomiting,  diarrhea, constipation, or urine problems?"       Nausea 11. PREGNANCY: "Is there any chance you are pregnant?" "When was your last menstrual period?"       No  Protocols used: ABDOMINAL PAIN - [facility]

## 2018-07-06 NOTE — Telephone Encounter (Signed)
Called pt and left a VM to call back. CRM created and sent to [facility] pool. 

## 2018-07-06 NOTE — Telephone Encounter (Signed)
-----   Message from De Hollingshead, [facility] sent at 07/01/2018  3:01 PM EDT ----- I looked into [employer]'s formulary and it looks like the ER formulation of lamotrigine is Tier 4, so I'm not surprised she can't afford it. I can call over to neurology tomorrow to see if they would be OK changing to IR lamotrigine, which is Tier 1. They may also want to start her back at a lower dose, depending on how long she's been off the medication.   I assume it wasn't a SJS-rash type itching? If she can't identify any environmental triggers, the most likely drug candidate would be the Emgality. However, it looks like she came to see Lauren for itching prior to starting on Emgality. Duloxetine has a listed <1% incidence of itching. Otherwise, nothing that I see that could be associated with itching, and I can't think of anything that would explain the temporal just-at-night itching.  ----- Message ----- From: Leone Haven, MD Sent: 06/30/2018   4:23 PM EDT To: De Hollingshead, [place],   This patient has had trouble affording her seizure medications. I wanted to see if there was any assistance program she would have access to. Could you also look at her medication list and see if any of the medications can cause itching? She has had itching for some time now only at night in her arms and legs. Thanks.   Randall Hiss

## 2018-07-06 NOTE — Telephone Encounter (Signed)
Called and spoke with pt. Pt advised and voiced understanding.  

## 2018-07-13 ENCOUNTER — Ambulatory Visit: Payer: PPO | Admitting: Neurology

## 2018-07-14 ENCOUNTER — Ambulatory Visit: Payer: PPO | Admitting: Neurology

## 2018-07-22 ENCOUNTER — Other Ambulatory Visit: Payer: Self-pay | Admitting: *Deleted

## 2018-08-27 ENCOUNTER — Other Ambulatory Visit: Payer: Self-pay | Admitting: Family Medicine

## 2018-08-27 DIAGNOSIS — E162 Hypoglycemia, unspecified: Secondary | ICD-10-CM

## 2018-09-03 MED ORDER — BLOOD GLUCOSE MONITOR KIT: PACK | 0 refills | Status: DC

## 2018-09-03 MED ORDER — TELMISARTAN 40 MG PO TABS: 40.0000 mg | ORAL_TABLET | Freq: Every day | ORAL | 0 refills | Status: DC

## 2018-09-03 MED ORDER — NITROGLYCERIN 0.4 MG SL SUBL: 0.4000 mg | SUBLINGUAL_TABLET | SUBLINGUAL | 0 refills | Status: DC | PRN

## 2018-09-08 ENCOUNTER — Telehealth: Payer: Self-pay | Admitting: Family Medicine

## 2018-09-08 ENCOUNTER — Telehealth: Payer: Self-pay | Admitting: Neurology

## 2018-09-08 MED ORDER — LAMOTRIGINE 200 MG PO TABS: 200.0000 mg | ORAL_TABLET | Freq: Two times a day (BID) | ORAL | 4 refills | Status: DC

## 2018-09-08 MED ORDER — GALCANEZUMAB-GNLM 120 MG/ML ~~LOC~~ SOAJ: 240.0000 mg | SUBCUTANEOUS | 0 refills | Status: AC

## 2018-09-08 MED ORDER — GALCANEZUMAB-GNLM 120 MG/ML ~~LOC~~ SOSY: 120.0000 mg | PREFILLED_SYRINGE | SUBCUTANEOUS | 2 refills | Status: AC

## 2018-09-08 MED ORDER — BUTALBITAL-APAP-CAFFEINE 50-325-40 MG PO TABS: 1.0000 | ORAL_TABLET | Freq: Four times a day (QID) | ORAL | 3 refills | Status: DC | PRN

## 2018-09-08 MED ORDER — LAMOTRIGINE 200 MG PO TABS: 200.0000 mg | ORAL_TABLET | Freq: Two times a day (BID) | ORAL | 4 refills | Status: AC

## 2018-09-08 NOTE — Telephone Encounter (Signed)
I have sent in Emgality, fioricet, and lamotrigine 200mg  bid to his pharmacy.

## 2018-09-08 NOTE — Telephone Encounter (Signed)
Called and advised pt to go be evaluated at [facility] or ED in [place] if symptoms continue if not, pt can schedule virtual visit with provider per PCP.  Pt preferred to schedule virtual visit.  Pt scheduled for virtual visit tomorrow morning (09/09/18) @ 9:30 am.  Pt advised to go ED or UC if symptoms worsen.

## 2018-09-08 NOTE — Telephone Encounter (Signed)
Copied from [place] [phone]. Topic: Quick Communication - See Telephone Encounter >> Sep 08, 2018  1:45 PM Nils Flack wrote: CRM for notification. See Telephone encounter for: 09/08/18. Pt says she feels dizzy and lightheaded, starte yesterday, is asking for somethng to [facility] in [place].  Pt also is asking for refill of telmisartan (MICARDIS) 40 MG tablet Pt also says that the pharm says they did not get the rx for the glucose meter  Please call [phone] if questions

## 2018-09-08 NOTE — Telephone Encounter (Signed)
Called and spoke to pt.  Pt said that she started feeling dizzy and lightheaded on yesterday.  Pt denies having shortness of breath and said she does not have the equipment with her to check her blood pressure.  Pt said she is in [place] and requested for prescriptions to be sent in to [facility] in [place] which is listed in her chart.  Pt said that the glucose strips where called in but she needs an Rx for the glucose meter and a refill on telmisartan.  She said she took her last pill today.  Pt is open to scheduling a virtual appointment if needed.    Last Labs:    A1C- 09/12/17 Other Labs:  12/04/17

## 2018-09-08 NOTE — Addendum Note (Signed)
Addended by: Marcial Pacas on: 09/08/2018 04:59 PM   Modules accepted: Orders

## 2018-09-08 NOTE — Telephone Encounter (Signed)
Noted  

## 2018-09-08 NOTE — Telephone Encounter (Addendum)
I have refilled the lamictal and emgality to [facility]. I will fwd the refill request for fioricet and question on light headedness to Dr. Krista Blue to review.

## 2018-09-08 NOTE — Telephone Encounter (Signed)
Pt called stating that the insurance she has now will cover her medications and she is needing them called in. She is needing the Galcanezumab-gnlm (EMGALITY) 120 MG/ML SOAJ the butalbital-acetaminophen-caffeine (FIORICET, ESGIC) 50-325-40 MG tablet and the lamoTRIgine (LAMICTAL) 200 MG tablet sent to [facility]  Pt also states she is feelilng very lighthead like she usually gets before a seizure and would like to know if there is anything that can be done or sent to her for this. Please advise.

## 2018-09-09 ENCOUNTER — Other Ambulatory Visit: Payer: Self-pay

## 2018-09-09 ENCOUNTER — Encounter: Payer: Self-pay | Admitting: Family Medicine

## 2018-09-09 ENCOUNTER — Ambulatory Visit (INDEPENDENT_AMBULATORY_CARE_PROVIDER_SITE_OTHER): Payer: Medicare HMO | Admitting: Family Medicine

## 2018-09-09 DIAGNOSIS — Z9884 Bariatric surgery status: Secondary | ICD-10-CM

## 2018-09-09 DIAGNOSIS — E162 Hypoglycemia, unspecified: Secondary | ICD-10-CM

## 2018-09-09 DIAGNOSIS — H8111 Benign paroxysmal vertigo, right ear: Secondary | ICD-10-CM | POA: Diagnosis not present

## 2018-09-09 MED ORDER — GLUCOSE BLOOD VI STRP: ORAL_STRIP | 12 refills | Status: AC

## 2018-09-09 MED ORDER — BLOOD GLUCOSE MONITOR KIT: PACK | 0 refills | Status: DC

## 2018-09-09 MED ORDER — MECLIZINE HCL 25 MG PO TABS: 25.0000 mg | ORAL_TABLET | Freq: Three times a day (TID) | ORAL | 0 refills | Status: DC | PRN

## 2018-09-09 NOTE — Assessment & Plan Note (Signed)
Refer to a local bariatric surgeon for follow-up.

## 2018-09-09 NOTE — Progress Notes (Signed)
Virtual Visit via video Note  This visit type was conducted due to national recommendations for restrictions regarding the COVID-19 pandemic (e.g. social distancing).  This format is felt to be most appropriate for this patient at this time.  All issues noted in this document were discussed and addressed.  No physical exam was performed (except for noted visual exam findings with Video Visits).   I connected with [facility] today at  9:30 AM EDT by a video enabled telemedicine application or telephone and verified that I am speaking with the correct person using two identifiers. Location patient: car Location provider: work  Persons participating in the virtual visit: patient, provider, Marissa Calamity (husband)  I discussed the limitations, risks, security and privacy concerns of performing an evaluation and management service by telephone and the availability of in person appointments. I also discussed with the patient that there may be a patient responsible charge related to this service. The patient expressed understanding and agreed to proceed.  Reason for visit: Same day visit.  HPI: Vertigo: Patient notes onset of this in the last several days.  She notes she gets a spinning sensation when she gets up and shifts her head to the right.  Also occurs when she lays down and shifts her head to the right.  She does note some tinnitus with some ear itching.  No ear fullness.  No fever.  No syncope.  Occasionally feels a spinning sensation at rest.  Notes it lasts for several seconds and then resolves with immobility of her head.  She has had this previously and was diagnosed with BPPV.  She does report she discussed this with neurology in the past and notes they recommended modified Epley maneuver.  Hypoglycemia: Patient continues to have some issues with this.  She does report seeing her gastric bypass surgeon's office in February and they started her on acarbose to help with appetite and  hypoglycemia.  Though the patient is unsure if it has been helping.  She continues to require Phenergan and Zofran and has not been eating very much due to nausea.  She does note that when she does eat more that she does not have the issues with low blood sugars.  She will feel jittery and break out into a cold sweat and feel tired.  She will eat and that will resolve her symptoms.   ROS: See pertinent positives and negatives per HPI.  Past Medical History:  Diagnosis Date  . Allergic rhinitis   . Anxiety   . Depression   . Diabetes mellitus    taken off meds in beginning fo 2018 dueto weight loss   . DVT of leg (deep venous thrombosis) ([place]) 12/2007   left leg, given warfarin for 6 mos  . GERD (gastroesophageal reflux disease)   . H/O hiatal hernia   . Hyperlipidemia   . Hypertension   . Morbid obesity ([place])   . Pericarditis    Diagnosed/treated at [facility]; per patient 4 episodes per cardiologist Dr. Edwin Dada persistant chest pain unclear if pericarditis verusus neuropsychogenic   . Renal cell cancer ([place]) 2009   S/p nephrectomy (left)  . Seizure disorder ([place])    (04/08/17)- None in over 1 yr, no meds.    Past Surgical History:  Procedure Laterality Date  . CHOLECYSTECTOMY N/A 05/02/2017   Procedure: LAPAROSCOPIC CHOLECYSTECTOMY WITH INTRAOPERATIVE CHOLANGIOGRAM;  Surgeon: Johnathan Hausen, MD;  Location: [facility];  Service: General;  Laterality: N/A;  . ENDOMETRIAL ABLATION  2007  .  ESOPHAGEAL MANOMETRY N/A 05/21/2016   Procedure: ESOPHAGEAL MANOMETRY (EM);  Surgeon: Lucilla Lame, MD;  Location: [facility] ENDOSCOPY;  Service: Endoscopy;  Laterality: N/A;  . ESOPHAGOGASTRODUODENOSCOPY (EGD) WITH PROPOFOL N/A 05/14/2016   Procedure: ESOPHAGOGASTRODUODENOSCOPY (EGD) WITH PROPOFOL;  Surgeon: Lucilla Lame, MD;  Location: [facility] ENDOSCOPY;  Service: Endoscopy;  Laterality: N/A;  . ESOPHAGOGASTRODUODENOSCOPY (EGD) WITH PROPOFOL N/A 04/10/2017   Procedure: ESOPHAGOGASTRODUODENOSCOPY (EGD) WITH PROPOFOL;   Surgeon: Lucilla Lame, MD;  Location: [place];  Service: Endoscopy;  Laterality: N/A;  . INSERTION OF MESH  2014  . LAPAROSCOPY N/A 06/16/2018   Procedure: LAPAROSCOPY DIAGNOSTIC;  Surgeon: Armandina Gemma, MD;  Location: [facility];  Service: General;  Laterality: N/A;  . LEFT HEART CATHETERIZATION WITH CORONARY ANGIOGRAM N/A 08/30/2013   Procedure: LEFT HEART CATHETERIZATION WITH CORONARY ANGIOGRAM;  Surgeon: Troy Sine, MD;  Location: [facility] CATH LAB;  Service: Cardiovascular;  Laterality: N/A;  . NEPHRECTOMY    . REPLACEMENT TOTAL KNEE BILATERAL Bilateral 2018   2/18, 5/18 [facility]  . ROUX-EN-Y GASTRIC BYPASS  04/27/2015   [facility]  . TUBAL LIGATION    . UMBILICAL HERNIA REPAIR    . UPPER GI ENDOSCOPY N/A 06/16/2018   Procedure: UPPER GI ENDOSCOPY;  Surgeon: Johnathan Hausen, MD;  Location: [facility];  Service: General;  Laterality: N/A;    Family History  Problem Relation Age of Onset  . Hypertension Mother   . Breast cancer Mother 39  . Arthritis Mother   . Heart disease Paternal Grandmother   . Alcoholism Father   . Hyperlipidemia Other        Parent  . Mental illness Other        Parent  . Diabetes Other        Parent    SOCIAL HX: Former smoker.   Current Outpatient Medications:  .  acarbose (PRECOSE) 25 MG tablet, Take 25 mg by mouth 3 (three) times daily with meals., Disp: , Rfl:  .  acetaminophen (TYLENOL) 325 MG tablet, You can take 2 tablets every 4 hours as needed for pain. DO NOT TAKE MORE THAN 4000 MG OF TYLENOL PER DAY.  IT CAN HARM YOUR LIVER.  TYLENOL (ACETAMINOPHEN) IS ALSO IN YOUR PRESCRIPTION PAIN MEDICATION.  YOU HAVE TO COUNT IT IN YOUR DAILY TOTAL., Disp: , Rfl:  .  albuterol (VENTOLIN HFA) 108 (90 Base) MCG/ACT inhaler, Inhale 2 puffs into the lungs every 6 (six) hours as needed for wheezing or shortness of breath., Disp: 3 Inhaler, Rfl: 1 .  aspirin 81 MG chewable tablet, Chew 81 mg by mouth daily., Disp: , Rfl:  .  blood glucose meter kit and supplies KIT,  Check once daily as needed for low blood sugar. ICD10 E16.2, Disp: 1 each, Rfl: 0 .  budesonide-formoterol (SYMBICORT) 160-4.5 MCG/ACT inhaler, Inhale 2 puffs into the lungs 2 (two) times daily. Rinse mouth after use, Disp: 1 Inhaler, Rfl: 12 .  butalbital-acetaminophen-caffeine (FIORICET) 50-325-40 MG tablet, Take 1 tablet by mouth every 6 (six) hours as needed for headache. Do not refill in less than 30 days, Disp: 12 tablet, Rfl: 3 .  carvedilol (COREG) 12.5 MG tablet, TAKE 1 TABLET BY MOUTH TWICE DAILY WITH MEALS (Patient taking differently: Take 12.5 mg by mouth 2 (two) times daily with a meal. ), Disp: 180 tablet, Rfl: 1 .  Cholecalciferol (VITAMIN D-3) 1000 units CAPS, Take 1 capsule (1,000 Units total) by mouth daily. Reported on 06/16/2015, Disp: 90 capsule, Rfl: 3 .  clotrimazole-betamethasone (LOTRISONE) cream, Apply 1 application  topically 2 (two) times daily., Disp: 30 g, Rfl: 0 .  dexlansoprazole (DEXILANT) 60 MG capsule, Take 1 capsule (60 mg total) by mouth daily., Disp: 90 capsule, Rfl: 1 .  DULoxetine (CYMBALTA) 30 MG capsule, Take 1 capsule (30 mg total) by mouth daily., Disp: , Rfl:  .  Galcanezumab-gnlm (EMGALITY) 120 MG/ML SOAJ, Inject 240 mg into the skin every 30 (thirty) days. This is the loading dose, Disp: 2 pen, Rfl: 0 .  Galcanezumab-gnlm (EMGALITY) 120 MG/ML SOSY, Inject 120 mg into the skin every 30 (thirty) days., Disp: 1 Syringe, Rfl: 2 .  HYDROcodone-acetaminophen (NORCO/VICODIN) 5-325 MG tablet, Take 1 tablet by mouth every 6 (six) hours as needed for severe pain., Disp: 20 tablet, Rfl: 0 .  hydrOXYzine (VISTARIL) 50 MG capsule, Take 1 capsule (50 mg total) by mouth 3 (three) times daily as needed for itching., Disp: 30 capsule, Rfl: 2 .  lamoTRIgine (LAMICTAL) 200 MG tablet, Take 1 tablet (200 mg total) by mouth 2 (two) times daily., Disp: 180 tablet, Rfl: 4 .  loratadine (CLARITIN) 10 MG tablet, Take 1 tablet (10 mg total) by mouth daily., Disp: 90 tablet, Rfl: 3 .   Melatonin 3 MG TABS, Take 9 mg by mouth. , Disp: , Rfl:  .  Multiple Vitamins-Minerals (MULTIVITAMIN WITH MINERALS) tablet, Take 1 tablet by mouth daily. Reported on 06/16/2015, Disp: , Rfl:  .  nitroGLYCERIN (NITROSTAT) 0.4 MG SL tablet, Place 1 tablet (0.4 mg total) under the tongue every 5 (five) minutes as needed for chest pain. (may repeat every 5 minutes but seek medical help if pain persists after 3 tablets), Disp: 25 tablet, Rfl: 0 .  ondansetron (ZOFRAN-ODT) 4 MG disintegrating tablet, DISSOLVE 1 TABLET IN MOUTH EVERY 8 HOURS AS NEEDED FOR NAUSEA AND VOMITING, Disp: , Rfl:  .  polyethylene glycol (MIRALAX / GLYCOLAX) packet, Follow the package directions and take 1-2 times a day as needed.  You need a.m. to have 1-2 soft bowel movements daily.  You can buy this over-the-counter at any drugstore., Disp: 14 each, Rfl: 0 .  promethazine (PHENERGAN) 25 MG tablet, TAKE 1 TABLET BY MOUTH EVERY 8 HOURS AS NEEDED FOR NAUSEA OR VOMITING, Disp: , Rfl:  .  sertraline (ZOLOFT) 50 MG tablet, Take 1 tablet (50 mg total) by mouth daily., Disp: 30 tablet, Rfl: 3 .  SUMAtriptan (IMITREX) 50 MG tablet, Take 1 tablet (50 mg total) by mouth every 2 (two) hours as needed for migraine. May repeat in 2 hours if headache persists or recurs., Disp: 10 tablet, Rfl: 3 .  telmisartan (MICARDIS) 40 MG tablet, Take 1 tablet (40 mg total) by mouth daily., Disp: 90 tablet, Rfl: 0 .  Tetrahydrozoline HCl (VISINE OP), Apply 2 drops to eye daily as needed (itchy eyes)., Disp: , Rfl:  .  vitamin B-12 (CYANOCOBALAMIN) 500 MCG tablet, Take 500 mcg by mouth daily. , Disp: , Rfl:  .  glucose blood test strip, Check as needed for low glucose, Disp: 100 each, Rfl: 12 .  meclizine (ANTIVERT) 25 MG tablet, Take 1 tablet (25 mg total) by mouth 3 (three) times daily as needed for dizziness., Disp: 30 tablet, Rfl: 0  EXAM:  VITALS per patient if applicable: None.  GENERAL: alert, oriented, appears well and in no acute distress  HEENT:  atraumatic, conjunttiva clear, no obvious abnormalities on inspection of external nose and ears  NECK: normal movements of the head and neck  LUNGS: on inspection no signs of respiratory distress, breathing rate appears  normal, no obvious gross SOB, gasping or wheezing  CV: no obvious cyanosis  MS: moves all visible extremities without noticeable abnormality  PSYCH/NEURO: pleasant and cooperative, no obvious depression or anxiety, speech and thought processing grossly intact  ASSESSMENT AND PLAN:  Discussed the following assessment and plan:  History of Roux-en-Y gastric bypass - Plan: Ambulatory referral to General Surgery  Hypoglycemia - Plan: blood glucose meter kit and supplies KIT  BPPV (benign paroxysmal positional vertigo), right  S/P gastric bypass  BPPV (benign paroxysmal positional vertigo), right Description seems consistent with BPPV.  We will have her complete the modified Epley maneuver for the right side.  If not improving over the next several days she will be evaluated at an urgent care.  If this worsens or becomes persistent she will be evaluated immediately.  If she develops new symptoms she will be evaluated as well.  We will trial meclizine as well.  I confirmed that this is adequate to use with her other medications with our clinical pharmacist.  Hypoglycemia She continues to have some issues with this.  I do believe this is related to poor intake orally given that it improves when she eats better.  She has been started on acarbose by her bariatric surgeon though she notes they advised her that she did not need to follow-up with them additionally.  She does feel like she needs to follow with the bariatric physician and I advised I would refer her to one locally.  We will send in her strips and glucometer for her to continue to monitor her sugars.  I discussed having her try to eat consistently.  Our clinical pharmacist also confirmed that there are no medications  that should be contributing to her hypoglycemia.  S/P gastric bypass Refer to a local bariatric surgeon for follow-up.  [place] office staff will contact the patient to schedule follow-up.  Social distancing precautions and sick precautions given regarding COVID-19.   I discussed the assessment and treatment plan with the patient. The patient was provided an opportunity to ask questions and all were answered. The patient agreed with the plan and demonstrated an understanding of the instructions.   The patient was advised to call back or seek an in-person evaluation if the symptoms worsen or if the condition fails to improve as anticipated.   Tommi Rumps, MD

## 2018-09-09 NOTE — Assessment & Plan Note (Signed)
Description seems consistent with BPPV.  We will have her complete the modified Epley maneuver for the right side.  If not improving over the next several days she will be evaluated at an urgent care.  If this worsens or becomes persistent she will be evaluated immediately.  If she develops new symptoms she will be evaluated as well.  We will trial meclizine as well.  I confirmed that this is adequate to use with her other medications with our clinical pharmacist.

## 2018-09-09 NOTE — Assessment & Plan Note (Addendum)
She continues to have some issues with this.  I do believe this is related to poor intake orally given that it improves when she eats better.  She has been started on acarbose by her bariatric surgeon though she notes they advised her that she did not need to follow-up with them additionally.  She does feel like she needs to follow with the bariatric physician and I advised I would refer her to one locally.  We will send in her strips and glucometer for her to continue to monitor her sugars.  I discussed having her try to eat consistently.  Our clinical pharmacist also confirmed that there are no medications that should be contributing to her hypoglycemia.

## 2018-09-09 NOTE — Patient Instructions (Signed)
How to Perform the Epley Maneuver  The Epley maneuver is an exercise that relieves symptoms of vertigo. Vertigo is the feeling that you or your surroundings are moving when they are not. When you feel vertigo, you may feel like the room is spinning and have trouble walking. Dizziness is a little different than vertigo. When you are dizzy, you may feel unsteady or light-headed.  You can do this maneuver at home whenever you have symptoms of vertigo. You can do it up to 3 times a day until your symptoms go away.  Even though the Epley maneuver may relieve your vertigo for a few weeks, it is possible that your symptoms will return. This maneuver relieves vertigo, but it does not relieve dizziness.  What are the risks?  If it is done correctly, the Epley maneuver is considered safe. Sometimes it can lead to dizziness or nausea that goes away after a short time. If you develop other symptoms, such as changes in vision, weakness, or numbness, stop doing the maneuver and call your health care provider.  How to perform the Epley maneuver  1. Sit on the edge of a bed or table with your back straight and your legs extended or hanging over the edge of the bed or table.  2. Turn your head halfway toward the affected ear or side.  3. Lie backward quickly with your head turned until you are lying flat on your back. You may want to position a pillow under your shoulders.  4. Hold this position for 30 seconds. You may experience an attack of vertigo. This is normal.  5. Turn your head to the opposite direction until your unaffected ear is facing the floor.  6. Hold this position for 30 seconds. You may experience an attack of vertigo. This is normal. Hold this position until the vertigo stops.  7. Turn your whole body to the same side as your head. Hold for another 30 seconds.  8. Sit back up.  You can repeat this exercise up to 3 times a day.  Follow these instructions at home:   After doing the Epley maneuver, you can return to  your normal activities.   Ask your health care provider if there is anything you should do at home to prevent vertigo. He or she may recommend that you:  ? Keep your head raised (elevated) with two or more pillows while you sleep.  ? Do not sleep on the side of your affected ear.  ? Get up slowly from bed.  ? Avoid sudden movements during the day.  ? Avoid extreme head movement, like looking up or bending over.  Contact a health care provider if:   Your vertigo gets worse.   You have other symptoms, including:  ? Nausea.  ? Vomiting.  ? Headache.  Get help right away if:   You have vision changes.   You have a severe or worsening headache or neck pain.   You cannot stop vomiting.   You have new numbness or weakness in any part of your body.  Summary   Vertigo is the feeling that you or your surroundings are moving when they are not.   The Epley maneuver is an exercise that relieves symptoms of vertigo.   If the Epley maneuver is done correctly, it is considered safe. You can do it up to 3 times a day.  This information is not intended to replace advice given to you by your health care provider. Make sure   you discuss any questions you have with your health care provider.  Document Released: 03/23/2013 Document Revised: 02/06/2016 Document Reviewed: 02/06/2016  Elsevier Interactive Patient Education  2019 Elsevier Inc.  \

## 2018-09-10 ENCOUNTER — Telehealth: Payer: Self-pay | Admitting: Family Medicine

## 2018-09-10 ENCOUNTER — Telehealth: Payer: Self-pay | Admitting: *Deleted

## 2018-09-10 MED ORDER — TOPIRAMATE 100 MG PO TABS: 100.0000 mg | ORAL_TABLET | Freq: Every day | ORAL | 11 refills | Status: DC

## 2018-09-10 NOTE — Telephone Encounter (Signed)
Copied from [place] [phone]. Topic: Quick Communication - See Telephone Encounter >> Sep 10, 2018  9:25 AM Valla Leaver wrote: CRM for notification. See Telephone encounter for: 09/10/18. Patient says [facility] will not fill blood glucose meter kit and supplies KIT until they get clarification on how many times she is supposed to check her blood sugar per day. Please contact [employer] to clarify.

## 2018-09-10 NOTE — Telephone Encounter (Signed)
The patient is aware and agreeable to this medication.

## 2018-09-10 NOTE — Addendum Note (Signed)
Addended by: Marcial Pacas on: 09/10/2018 11:37 AM   Modules accepted: Orders

## 2018-09-10 NOTE — Telephone Encounter (Signed)
She needs to check her blood glucose once daily. Thanks.

## 2018-09-10 NOTE — Telephone Encounter (Signed)
Please let patient know, I have added on Topamax 100mg  qhs as migraine prevention

## 2018-09-10 NOTE — Telephone Encounter (Signed)
A prescription for Emgality was sent in for this patient. I called [employer] to initiate a prior authorization.  Her insurance plan requires trial and failure of all formulary medications before considering coverage for Emgality.  The following is a list of covered meds:  1) topiramate 2) divalproex DR 3) propranolol 4) venlafaxine 5) metoprolol  I have spoken to the patient and explained the situation.  She is willing to change to any of the above medications.  She would like the new prescription sent to [facility] on [address] in [place].

## 2018-10-05 MED ORDER — ACCU-CHEK FASTCLIX LANCETS MISC: 1.0000 | Freq: Every day | 3 refills | Status: AC

## 2018-10-05 NOTE — Telephone Encounter (Signed)
Patient needs a Rx for lancets sent to pharmacy.  Jacier Gladu,cma

## 2018-10-05 NOTE — Telephone Encounter (Signed)
Patient said that she got everything taken care of for the glucometer through [facility] but says that she needs a prescription for AccuChek lancets.  Patient said that Rx can be called into the [facility] that is listed in her chart and she will call to have it transferred over to a [facility] in [place] for pick up since she unsure of the the address.

## 2018-10-05 NOTE — Telephone Encounter (Signed)
Sent to pharmacy 

## 2018-10-06 NOTE — Telephone Encounter (Signed)
Patient states she has the kit and supplies and I informed her the lancets were sent to [facility].  Pt understood.  Osborn Pullin,cma

## 2018-10-07 ENCOUNTER — Telehealth: Payer: Self-pay | Admitting: Family Medicine

## 2018-10-07 NOTE — Telephone Encounter (Signed)
Referral Request - Has patient seen PCP for this complaint? No.   Referral for which specialty: [facility] Preferred provider/office: [facility] clinic  Reason for referral: Hurt shoulder/arm

## 2018-10-12 NOTE — Telephone Encounter (Signed)
This patient called for a referral to ortho she would like it to be at the [facility] ortho clinic for her shoulder/arm.  Maxximus Gotay,cma

## 2018-10-13 NOTE — Telephone Encounter (Signed)
I need more details regarding this pain.  I would suggest that we do a virtual visit with me or Lauren to determine the extent of her issue and how quickly she will need to see orthopedics.  Thanks.

## 2018-10-14 NOTE — Telephone Encounter (Signed)
appt scheduled with lauren for tomorrow.  Nina,cma

## 2018-10-15 ENCOUNTER — Other Ambulatory Visit: Payer: Self-pay

## 2018-10-15 ENCOUNTER — Ambulatory Visit (INDEPENDENT_AMBULATORY_CARE_PROVIDER_SITE_OTHER): Payer: PRIVATE HEALTH INSURANCE | Admitting: Family Medicine

## 2018-10-15 ENCOUNTER — Encounter: Payer: Self-pay | Admitting: Family Medicine

## 2018-10-15 DIAGNOSIS — F329 Major depressive disorder, single episode, unspecified: Secondary | ICD-10-CM

## 2018-10-15 DIAGNOSIS — F419 Anxiety disorder, unspecified: Secondary | ICD-10-CM

## 2018-10-15 DIAGNOSIS — F32A Depression, unspecified: Secondary | ICD-10-CM

## 2018-10-15 DIAGNOSIS — G8929 Other chronic pain: Secondary | ICD-10-CM | POA: Diagnosis not present

## 2018-10-15 DIAGNOSIS — M25511 Pain in right shoulder: Secondary | ICD-10-CM

## 2018-10-15 DIAGNOSIS — R002 Palpitations: Secondary | ICD-10-CM

## 2018-10-15 MED ORDER — CARVEDILOL 12.5 MG PO TABS: 12.5000 mg | ORAL_TABLET | Freq: Two times a day (BID) | ORAL | 1 refills | Status: AC

## 2018-10-15 MED ORDER — TRAMADOL HCL 50 MG PO TABS: 50.0000 mg | ORAL_TABLET | Freq: Three times a day (TID) | ORAL | 0 refills | Status: AC | PRN

## 2018-10-15 MED ORDER — DULOXETINE HCL 30 MG PO CPEP: 30.0000 mg | ORAL_CAPSULE | Freq: Every day | ORAL | 1 refills | Status: AC

## 2018-10-15 NOTE — Progress Notes (Signed)
Patient ID: Denise Davis, female   DOB: 1966-02-27, 53 y.o.   MRN: [id number]    Virtual Visit via video Note  This visit type was conducted due to national recommendations for restrictions regarding the COVID-19 pandemic (e.g. social distancing).  This format is felt to be most appropriate for this patient at this time.  All issues noted in this document were discussed and addressed.  No physical exam was performed (except for noted visual exam findings with Video Visits).   I connected with Isaias Sakai today at 10:00 AM EDT by a video enabled telemedicine application and verified that I am speaking with the correct person using two identifiers. Location patient: home Location provider: work or home office Persons participating in the virtual visit: patient, provider  I discussed the limitations, risks, security and privacy concerns of performing an evaluation and management service by telephone and the availability of in person appointments. I also discussed with the patient that there may be a patient responsible charge related to this service. The patient expressed understanding and agreed to proceed.  HPI:  Patient and I connected via video to discuss pain in right upper arm/right shoulder that is been going on for a few weeks.  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  States there has been a few times where the fingers on her right hand have felt numb, but the numbness will go away/come back at random times.  Denies dropping any things or loss of grip strength.  Is still able to use right hand to write with a pen.  Has used Tylenol as needed for pain, cannot take ibuprofen.  Patient also requesting refill on Cymbalta, feels well on 30 mg dose.  Uses medication for both anxiety and depression as well as chronic pain.  Denies any SI or HI.  Also needs refill of  Coreg.  Denies any chest pain, palpitations, shortness of breath or lower extremity swelling.  ROS: See pertinent positives and negatives per HPI.  Past Medical History:  Diagnosis Date  . Allergic rhinitis   . Anxiety   . Depression   . Diabetes mellitus    taken off meds in beginning fo 2018 dueto weight loss   . DVT of leg (deep venous thrombosis) ([place]) 12/2007   left leg, given warfarin for 6 mos  . GERD (gastroesophageal reflux disease)   . H/O hiatal hernia   . Hyperlipidemia   . Hypertension   . Morbid obesity ([place])   . Pericarditis    Diagnosed/treated at [facility]; per patient 4 episodes per cardiologist Dr. Edwin Dada persistant chest pain unclear if pericarditis verusus neuropsychogenic   . Renal cell cancer ([place]) 2009   S/p nephrectomy (left)  . Seizure disorder ([place])    (04/08/17)- None in over 1 yr, no meds.    Past Surgical History:  Procedure Laterality Date  . CHOLECYSTECTOMY N/A 05/02/2017   Procedure: LAPAROSCOPIC CHOLECYSTECTOMY WITH INTRAOPERATIVE CHOLANGIOGRAM;  Surgeon: Johnathan Hausen, MD;  Location: [facility] ORS;  Service: General;  Laterality: N/A;  . ENDOMETRIAL ABLATION  2007  . ESOPHAGEAL MANOMETRY N/A 05/21/2016   Procedure: ESOPHAGEAL MANOMETRY (EM);  Surgeon: Lucilla Lame, MD;  Location: [facility] ENDOSCOPY;  Service: Endoscopy;  Laterality: N/A;  . ESOPHAGOGASTRODUODENOSCOPY (EGD) WITH PROPOFOL N/A 05/14/2016   Procedure: ESOPHAGOGASTRODUODENOSCOPY (EGD) WITH PROPOFOL;  Surgeon: Lucilla Lame, MD;  Location[facility]  ENDOSCOPY;  Service: Endoscopy;  Laterality: N/A;  . ESOPHAGOGASTRODUODENOSCOPY (EGD) WITH PROPOFOL N/A 04/10/2017   Procedure: ESOPHAGOGASTRODUODENOSCOPY (EGD) WITH PROPOFOL;  Surgeon: Lucilla Lame, MD;  Location: [facility];  Service: Endoscopy;  Laterality: N/A;  . INSERTION OF MESH  2014  . LAPAROSCOPY N/A 06/16/2018   Procedure: LAPAROSCOPY DIAGNOSTIC;  Surgeon: Armandina Gemma, MD;  Location: [facility];  Service: General;  Laterality: N/A;  . LEFT HEART  CATHETERIZATION WITH CORONARY ANGIOGRAM N/A 08/30/2013   Procedure: LEFT HEART CATHETERIZATION WITH CORONARY ANGIOGRAM;  Surgeon: Troy Sine, MD;  Location: [facility] CATH LAB;  Service: Cardiovascular;  Laterality: N/A;  . NEPHRECTOMY    . REPLACEMENT TOTAL KNEE BILATERAL Bilateral 2018   2/18, 5/18 [facility]  . ROUX-EN-Y GASTRIC BYPASS  04/27/2015   [facility]  . TUBAL LIGATION    . UMBILICAL HERNIA REPAIR    . UPPER GI ENDOSCOPY N/A 06/16/2018   Procedure: UPPER GI ENDOSCOPY;  Surgeon: Johnathan Hausen, MD;  Location: [facility];  Service: General;  Laterality: N/A;    Family History  Problem Relation Age of Onset  . Hypertension Mother   . Breast cancer Mother 58  . Arthritis Mother   . Heart disease Paternal Grandmother   . Alcoholism Father   . Hyperlipidemia Other        Parent  . Mental illness Other        Parent  . Diabetes Other        Parent   Social History   Tobacco Use  . Smoking status: Former Smoker    Types: Cigarettes    Quit date: 1984    Years since quitting: 36.5  . Smokeless tobacco: Never Used  Substance Use Topics  . Alcohol use: Yes    Frequency: Never    Comment: OCC    Current Outpatient Medications:  .  acarbose (PRECOSE) 25 MG tablet, Take 25 mg by mouth 3 (three) times daily with meals., Disp: , Rfl:  .  Accu-Chek FastClix Lancets MISC, 1 each by Does not apply route daily., Disp: 204 each, Rfl: 3 .  acetaminophen (TYLENOL) 325 MG tablet, You can take 2 tablets every 4 hours as needed for pain. DO NOT TAKE MORE THAN 4000 MG OF TYLENOL PER DAY.  IT CAN HARM YOUR LIVER.  TYLENOL (ACETAMINOPHEN) IS ALSO IN YOUR PRESCRIPTION PAIN MEDICATION.  YOU HAVE TO COUNT IT IN YOUR DAILY TOTAL., Disp: , Rfl:  .  albuterol (VENTOLIN HFA) 108 (90 Base) MCG/ACT inhaler, Inhale 2 puffs into the lungs every 6 (six) hours as needed for wheezing or shortness of breath., Disp: 3 Inhaler, Rfl: 1 .  aspirin 81 MG chewable tablet, Chew 81 mg by mouth daily., Disp: , Rfl:  .  blood glucose  meter kit and supplies KIT, Check once daily as needed for low blood sugar. ICD10 E16.2, Disp: 1 each, Rfl: 0 .  budesonide-formoterol (SYMBICORT) 160-4.5 MCG/ACT inhaler, Inhale 2 puffs into the lungs 2 (two) times daily. Rinse mouth after use, Disp: 1 Inhaler, Rfl: 12 .  butalbital-acetaminophen-caffeine (FIORICET) 50-325-40 MG tablet, Take 1 tablet by mouth every 6 (six) hours as needed for headache. Do not refill in less than 30 days, Disp: 12 tablet, Rfl: 3 .  carvedilol (COREG) 12.5 MG tablet, TAKE 1 TABLET BY MOUTH TWICE DAILY WITH MEALS (Patient taking differently: Take 12.5 mg by mouth 2 (two) times daily with a meal. ), Disp: 180 tablet, Rfl: 1 .  Cholecalciferol (VITAMIN D-3) 1000 units CAPS, Take 1 capsule (  1,000 Units total) by mouth daily. Reported on 06/16/2015, Disp: 90 capsule, Rfl: 3 .  clotrimazole-betamethasone (LOTRISONE) cream, Apply 1 application topically 2 (two) times daily., Disp: 30 g, Rfl: 0 .  dexlansoprazole (DEXILANT) 60 MG capsule, Take 1 capsule (60 mg total) by mouth daily., Disp: 90 capsule, Rfl: 1 .  DULoxetine (CYMBALTA) 30 MG capsule, Take 1 capsule (30 mg total) by mouth daily., Disp: , Rfl:  .  Galcanezumab-gnlm (EMGALITY) 120 MG/ML SOAJ, Inject 240 mg into the skin every 30 (thirty) days. This is the loading dose, Disp: 2 pen, Rfl: 0 .  Galcanezumab-gnlm (EMGALITY) 120 MG/ML SOSY, Inject 120 mg into the skin every 30 (thirty) days., Disp: 1 Syringe, Rfl: 2 .  glucose blood test strip, Check as needed for low glucose, Disp: 100 each, Rfl: 12 .  HYDROcodone-acetaminophen (NORCO/VICODIN) 5-325 MG tablet, Take 1 tablet by mouth every 6 (six) hours as needed for severe pain., Disp: 20 tablet, Rfl: 0 .  hydrOXYzine (VISTARIL) 50 MG capsule, Take 1 capsule (50 mg total) by mouth 3 (three) times daily as needed for itching., Disp: 30 capsule, Rfl: 2 .  lamoTRIgine (LAMICTAL) 200 MG tablet, Take 1 tablet (200 mg total) by mouth 2 (two) times daily., Disp: 180 tablet, Rfl:  4 .  loratadine (CLARITIN) 10 MG tablet, Take 1 tablet (10 mg total) by mouth daily., Disp: 90 tablet, Rfl: 3 .  meclizine (ANTIVERT) 25 MG tablet, Take 1 tablet (25 mg total) by mouth 3 (three) times daily as needed for dizziness., Disp: 30 tablet, Rfl: 0 .  Melatonin 3 MG TABS, Take 9 mg by mouth. , Disp: , Rfl:  .  Multiple Vitamins-Minerals (MULTIVITAMIN WITH MINERALS) tablet, Take 1 tablet by mouth daily. Reported on 06/16/2015, Disp: , Rfl:  .  nitroGLYCERIN (NITROSTAT) 0.4 MG SL tablet, Place 1 tablet (0.4 mg total) under the tongue every 5 (five) minutes as needed for chest pain. (may repeat every 5 minutes but seek medical help if pain persists after 3 tablets), Disp: 25 tablet, Rfl: 0 .  ondansetron (ZOFRAN-ODT) 4 MG disintegrating tablet, DISSOLVE 1 TABLET IN MOUTH EVERY 8 HOURS AS NEEDED FOR NAUSEA AND VOMITING, Disp: , Rfl:  .  polyethylene glycol (MIRALAX / GLYCOLAX) packet, Follow the package directions and take 1-2 times a day as needed.  You need a.m. to have 1-2 soft bowel movements daily.  You can buy this over-the-counter at any drugstore., Disp: 14 each, Rfl: 0 .  promethazine (PHENERGAN) 25 MG tablet, TAKE 1 TABLET BY MOUTH EVERY 8 HOURS AS NEEDED FOR NAUSEA OR VOMITING, Disp: , Rfl:  .  sertraline (ZOLOFT) 50 MG tablet, Take 1 tablet (50 mg total) by mouth daily., Disp: 30 tablet, Rfl: 3 .  SUMAtriptan (IMITREX) 50 MG tablet, Take 1 tablet (50 mg total) by mouth every 2 (two) hours as needed for migraine. May repeat in 2 hours if headache persists or recurs., Disp: 10 tablet, Rfl: 3 .  telmisartan (MICARDIS) 40 MG tablet, Take 1 tablet (40 mg total) by mouth daily., Disp: 90 tablet, Rfl: 0 .  Tetrahydrozoline HCl (VISINE OP), Apply 2 drops to eye daily as needed (itchy eyes)., Disp: , Rfl:  .  topiramate (TOPAMAX) 100 MG tablet, Take 1 tablet (100 mg total) by mouth at bedtime., Disp: 30 tablet, Rfl: 11 .  vitamin B-12 (CYANOCOBALAMIN) 500 MCG tablet, Take 500 mcg by mouth daily.  , Disp: , Rfl:   EXAM:  GENERAL: alert, oriented, appears well and in no acute  distress  HEENT: atraumatic, conjunttiva clear, no obvious abnormalities on inspection of external nose and ears  NECK: normal movements of the head and neck  LUNGS: on inspection no signs of respiratory distress, breathing rate appears normal, no obvious gross SOB, gasping or wheezing  CV: no obvious cyanosis  MS: moves all visible extremities without noticeable abnormality.  Able to reach right arm straight up above head to side and in front of body.  Able to wiggle all fingers and make a tight fist.  PSYCH/NEURO: pleasant and cooperative, no obvious depression or anxiety, speech and thought processing grossly intact  ASSESSMENT AND PLAN:  Discussed the following assessment and plan:  Right shoulder pain-suspect patient has some sort of arthritis in the right shoulder that is causing this pain.  She would like referral to orthopedics, orthopedic referral placed.  Also given small amount of tramadol to use as needed for more severe pain and advised she can continue to use Tylenol as needed for mild to moderate pain.  Suggest that she try topical rub like a BenGay or Biofreeze on sore area to help reduce pain as well.  Also recommended gentle range of motion exercises to help reduce pain. [facility] PMP registry checked and is appropriate for prescription of tramadol.  Anxiety and depression/chronic pain-patient will continue Cymbalta 30 mg daily, refill sent to pharmacy  Palpitations- she will continue Coreg at current dose.  Refill sent to pharmacy.   I discussed the assessment and treatment plan with the patient. The patient was provided an opportunity to ask questions and all were answered. The patient agreed with the plan and demonstrated an understanding of the instructions.   The patient was advised to call back or seek an in-person evaluation if the symptoms worsen or if the condition fails to  improve as anticipated.   Jodelle Green, FNP

## 2018-10-21 ENCOUNTER — Telehealth: Payer: Self-pay | Admitting: Family Medicine

## 2018-10-21 DIAGNOSIS — M25511 Pain in right shoulder: Secondary | ICD-10-CM

## 2018-10-21 NOTE — Telephone Encounter (Signed)
Copied from [place] [id number]. Topic: Quick Communication - Rx Refill/Question >> Oct 21, 2018  3:11 PM Denise Davis, Marland Kitchen wrote: Medication: tramadol  Has the patient contacted their pharmacy? Yes.   (Agent: If no, request that the patient contact the pharmacy for the refill.) (Agent: If yes, when and what did the pharmacy advise?)  Preferred Pharmacy (with phone number or street name): [facility] [place]  [place]   Agent: Please be advised that RX refills may take up to 3 business days. We ask that you follow-up with your pharmacy.

## 2018-10-21 NOTE — Telephone Encounter (Signed)
I don't see this medication listed in patient's chart.

## 2018-10-22 MED ORDER — METHYLPREDNISOLONE 4 MG PO TBPK: ORAL_TABLET | ORAL | 0 refills | Status: AC

## 2018-10-22 NOTE — Telephone Encounter (Signed)
It appears that this was prescribed recently for an acute issue after she saw Lauren. I would not recommend treatment with tramadol given her history of seizures as it can lower the seizure threshold and place her at risk of having a seizure. I will forward to Lauren since she saw the patient so she can come up with an alternative for the patient. Thanks.

## 2018-10-22 NOTE — Telephone Encounter (Signed)
Because we can't use NSAIDs our options are very limited.  Tramadol Rx was not meant to be a long term medication.   Does she have any updates on orthopedic appt?  Topical rubs like BIOFREEZE can be very effective and as needed tylenol  We can do a short burst of oral steroid, but again this is not a long term treatment  Let me know about ortho appt, if not for a while we can try sports medicine referral also

## 2018-10-22 NOTE — Telephone Encounter (Signed)
Called Pt and she does not have an appt for the Orthopedic doctor yet. Pt stated they were supposed to call her back with a office close to her because she moved to [facility]. Pt stated Yes she is willing to go to Sports med or Orthopedic it doesn't matter. The Pt would also like to try  the Oral steroid as well.

## 2018-10-22 NOTE — Telephone Encounter (Signed)
Is the pharmacy UTD? Has [facility] one listed Still, I sent her Rx to pharmacy on file  If she is in [place], I am not sure about [facility] facilities out that way but I did put in sports med referral

## 2018-10-22 NOTE — Telephone Encounter (Signed)
Called Pt and told her about the Rx being sent to the pharmacy for her to pick up. Pt stated she lives in [place] and her Rx can go to the pharmacy on file in [place].

## 2018-10-22 NOTE — Telephone Encounter (Signed)
Patient aware.  Pt will wait for return call for alternative med recommendation.

## 2018-10-30 ENCOUNTER — Telehealth: Payer: Self-pay | Admitting: *Deleted

## 2018-10-30 NOTE — Telephone Encounter (Signed)
Copied from [place] [id number]. Topic: Referral - Question >> Oct 30, 2018  3:35 PM Rayann Heman wrote: Reason for CRM: [name] calling with [facility] called and stated that patient will have need an authorization with [employer]. Please advise

## 2018-11-04 NOTE — Telephone Encounter (Signed)
Pt calling to check the status of her auth for ortho. Please call pt to advise.

## 2018-12-08 ENCOUNTER — Other Ambulatory Visit: Payer: Self-pay | Admitting: Neurology

## 2018-12-08 MED ORDER — BUTALBITAL-APAP-CAFFEINE 50-325-40 MG PO TABS: 1.0000 | ORAL_TABLET | Freq: Four times a day (QID) | ORAL | 1 refills | Status: AC | PRN

## 2018-12-08 NOTE — Addendum Note (Signed)
Addended by: Noberto Retort C on: 12/08/2018 02:51 PM   Modules accepted: Orders

## 2018-12-08 NOTE — Telephone Encounter (Signed)
I called patient regarding rescheduling her 10/2 appointment with NP due to office closure. Patient requested refill of medication she takes for her headaches, unable to get the name of the prescription. I advised that RN will reach out to her regarding this refill.

## 2018-12-08 NOTE — Telephone Encounter (Signed)
The patient is out of her Fioricet.  She is requesting a refill.  She has a pending appt on 01/19/2019.  Prescription sent to MD for approval.

## 2018-12-21 ENCOUNTER — Other Ambulatory Visit: Payer: Self-pay | Admitting: Family Medicine

## 2019-01-01 ENCOUNTER — Ambulatory Visit: Payer: PPO | Admitting: Neurology

## 2019-01-19 ENCOUNTER — Ambulatory Visit: Payer: PPO | Admitting: Neurology

## 2019-03-19 ENCOUNTER — Encounter: Payer: Self-pay | Admitting: Family Medicine

## 2019-03-19 DIAGNOSIS — F329 Major depressive disorder, single episode, unspecified: Secondary | ICD-10-CM

## 2019-03-19 DIAGNOSIS — L299 Pruritus, unspecified: Secondary | ICD-10-CM

## 2019-03-19 DIAGNOSIS — F419 Anxiety disorder, unspecified: Secondary | ICD-10-CM

## 2019-03-22 ENCOUNTER — Other Ambulatory Visit: Payer: Self-pay | Admitting: *Deleted

## 2019-03-22 MED ORDER — SERTRALINE HCL 50 MG PO TABS: 50.0000 mg | ORAL_TABLET | Freq: Every day | ORAL | 3 refills | Status: AC

## 2019-03-22 MED ORDER — SUMATRIPTAN SUCCINATE 50 MG PO TABS: 50.0000 mg | ORAL_TABLET | Freq: Once | ORAL | 3 refills | Status: DC

## 2019-03-22 MED ORDER — HYDROXYZINE PAMOATE 50 MG PO CAPS: 50.0000 mg | ORAL_CAPSULE | Freq: Three times a day (TID) | ORAL | 2 refills | Status: DC | PRN

## 2019-03-22 MED ORDER — TOPIRAMATE 100 MG PO TABS: 100.0000 mg | ORAL_TABLET | Freq: Every day | ORAL | 0 refills | Status: AC

## 2019-04-16 ENCOUNTER — Other Ambulatory Visit: Payer: Self-pay | Admitting: Neurology

## 2019-05-11 ENCOUNTER — Other Ambulatory Visit: Payer: Self-pay | Admitting: Family Medicine

## 2019-05-11 ENCOUNTER — Other Ambulatory Visit: Payer: Self-pay

## 2019-05-11 DIAGNOSIS — E162 Hypoglycemia, unspecified: Secondary | ICD-10-CM

## 2019-05-13 MED ORDER — TELMISARTAN 40 MG PO TABS: 40.0000 mg | ORAL_TABLET | Freq: Every day | ORAL | 0 refills | Status: DC

## 2019-05-13 MED ORDER — MECLIZINE HCL 25 MG PO TABS: 25.0000 mg | ORAL_TABLET | Freq: Three times a day (TID) | ORAL | 0 refills | Status: DC | PRN

## 2019-05-13 MED ORDER — BLOOD GLUCOSE MONITOR KIT: PACK | 0 refills | Status: AC

## 2019-05-13 MED ORDER — SUMATRIPTAN SUCCINATE 50 MG PO TABS: 50.0000 mg | ORAL_TABLET | Freq: Once | ORAL | 3 refills | Status: AC

## 2019-05-13 MED ORDER — NITROGLYCERIN 0.4 MG SL SUBL: 0.4000 mg | SUBLINGUAL_TABLET | SUBLINGUAL | 0 refills | Status: AC | PRN

## 2019-05-14 ENCOUNTER — Other Ambulatory Visit: Payer: Self-pay | Admitting: Family Medicine

## 2019-05-27 ENCOUNTER — Other Ambulatory Visit: Payer: Self-pay | Admitting: Family Medicine

## 2019-05-28 ENCOUNTER — Telehealth: Payer: Self-pay

## 2019-05-28 NOTE — Telephone Encounter (Signed)
I received a fax request for telimisartan and I called the patient and she informed me that she moved to [facility] and has a new PCP.  I then called the pharmacy and cancelled her RX for telemisarten.  Sharmarke Cicio,cma

## 2019-06-11 ENCOUNTER — Other Ambulatory Visit: Payer: Self-pay | Admitting: Family Medicine

## 2019-06-28 ENCOUNTER — Ambulatory Visit: Payer: PPO

## 2019-07-13 ENCOUNTER — Other Ambulatory Visit: Payer: Self-pay | Admitting: Family Medicine

## 2019-07-20 ENCOUNTER — Other Ambulatory Visit: Payer: Self-pay | Admitting: Family Medicine

## 2019-09-03 ENCOUNTER — Other Ambulatory Visit: Payer: Self-pay | Admitting: Family Medicine

## 2019-09-03 DIAGNOSIS — L299 Pruritus, unspecified: Secondary | ICD-10-CM

## 2019-11-01 ENCOUNTER — Other Ambulatory Visit: Payer: Self-pay | Admitting: Internal Medicine

## 2019-11-01 DIAGNOSIS — L299 Pruritus, unspecified: Secondary | ICD-10-CM
